# Patient Record
Sex: Female | Born: 1985
Health system: Southern US, Community
[De-identification: ages and names within clinical notes are randomized; demographics above are authoritative.]

## PROBLEM LIST (undated history)

## (undated) ENCOUNTER — Inpatient Hospital Stay (HOSPITAL_COMMUNITY): Payer: Self-pay

## (undated) DIAGNOSIS — O10919 Unspecified pre-existing hypertension complicating pregnancy, unspecified trimester: Secondary | ICD-10-CM

## (undated) DIAGNOSIS — A630 Anogenital (venereal) warts: Secondary | ICD-10-CM

## (undated) DIAGNOSIS — I5042 Chronic combined systolic (congestive) and diastolic (congestive) heart failure: Secondary | ICD-10-CM

## (undated) DIAGNOSIS — I513 Intracardiac thrombosis, not elsewhere classified: Secondary | ICD-10-CM

## (undated) DIAGNOSIS — I10 Essential (primary) hypertension: Secondary | ICD-10-CM

## (undated) DIAGNOSIS — J069 Acute upper respiratory infection, unspecified: Secondary | ICD-10-CM

## (undated) DIAGNOSIS — Z86711 Personal history of pulmonary embolism: Secondary | ICD-10-CM

## (undated) DIAGNOSIS — I499 Cardiac arrhythmia, unspecified: Secondary | ICD-10-CM

## (undated) DIAGNOSIS — I480 Paroxysmal atrial fibrillation: Secondary | ICD-10-CM

## (undated) DIAGNOSIS — I428 Other cardiomyopathies: Secondary | ICD-10-CM

## (undated) DIAGNOSIS — O99210 Obesity complicating pregnancy, unspecified trimester: Secondary | ICD-10-CM

## (undated) HISTORY — DX: Paroxysmal atrial fibrillation: I48.0

## (undated) HISTORY — DX: Acute upper respiratory infection, unspecified: J06.9

## (undated) HISTORY — DX: Other cardiomyopathies: I42.8

## (undated) HISTORY — DX: Obesity complicating pregnancy, unspecified trimester: O99.210

## (undated) HISTORY — DX: Personal history of pulmonary embolism: Z86.711

## (undated) HISTORY — DX: Essential (primary) hypertension: I10

## (undated) HISTORY — DX: Chronic combined systolic (congestive) and diastolic (congestive) heart failure: I50.42

## (undated) HISTORY — DX: Intracardiac thrombosis, not elsewhere classified: I51.3

## (undated) HISTORY — DX: Anogenital (venereal) warts: A63.0

## (undated) HISTORY — DX: Unspecified pre-existing hypertension complicating pregnancy, unspecified trimester: O10.919

---
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
## 2011-03-17 ENCOUNTER — Emergency Department (HOSPITAL_COMMUNITY)
Admission: EM | Admit: 2011-03-17 | Discharge: 2011-03-18 | Disposition: A | Discharge status: Home / Self Care | Payer: BC Managed Care – PPO | Attending: Emergency Medicine | Admitting: Emergency Medicine

## 2011-03-17 DIAGNOSIS — IMO0002 Reserved for concepts with insufficient information to code with codable children: Secondary | ICD-10-CM | POA: Insufficient documentation

## 2011-03-17 DIAGNOSIS — S93409A Sprain of unspecified ligament of unspecified ankle, initial encounter: Secondary | ICD-10-CM | POA: Insufficient documentation

## 2011-03-17 DIAGNOSIS — M25529 Pain in unspecified elbow: Secondary | ICD-10-CM | POA: Insufficient documentation

## 2011-03-17 DIAGNOSIS — S5000XA Contusion of unspecified elbow, initial encounter: Secondary | ICD-10-CM | POA: Insufficient documentation

## 2011-03-17 DIAGNOSIS — M25579 Pain in unspecified ankle and joints of unspecified foot: Secondary | ICD-10-CM | POA: Insufficient documentation

## 2011-03-18 ENCOUNTER — Emergency Department (HOSPITAL_COMMUNITY): Payer: BC Managed Care – PPO

## 2013-08-09 ENCOUNTER — Ambulatory Visit (INDEPENDENT_AMBULATORY_CARE_PROVIDER_SITE_OTHER): Payer: BC Managed Care – PPO | Admitting: Physician Assistant

## 2013-08-09 VITALS — BP 160/110 | HR 82 | Temp 98.7°F | Resp 16 | Ht 63.0 in | Wt 222.0 lb

## 2013-08-09 DIAGNOSIS — Z8619 Personal history of other infectious and parasitic diseases: Secondary | ICD-10-CM

## 2013-08-09 DIAGNOSIS — Z23 Encounter for immunization: Secondary | ICD-10-CM

## 2013-08-09 DIAGNOSIS — Z111 Encounter for screening for respiratory tuberculosis: Secondary | ICD-10-CM

## 2013-08-09 DIAGNOSIS — Z881 Allergy status to other antibiotic agents status: Secondary | ICD-10-CM

## 2013-08-09 NOTE — Patient Instructions (Addendum)
Return in 48-72 hours for the skin test to be read; Return in 2 months for the second dose of Gardasil vaccine, and then in April 2015 for dose #3.

## 2013-08-09 NOTE — Progress Notes (Signed)
  Tuberculosis Risk Questionnaire  1. No Were you born outside the [place] in one of the following parts of the world: [place], [place], [place], [place] or [place]?    2. No Have you traveled outside the [place] and lived for more than one month in one of the following parts of the world: [place], [place], [place], [place] or [place]?    3. No Do you have a compromised immune system such as from any of the following conditions:HIV/AIDS, organ or bone marrow transplantation, diabetes, immunosuppressive medicines (e.g. Prednisone, Remicaide), leukemia, lymphoma, cancer of the head or neck, gastrectomy or jejunal bypass, end-stage renal disease (on dialysis), or silicosis?     4. Yes [facility] 2011, not worked in this type of facility >12 months) Have you ever or do you plan on working in: a residential care center, a health care facility, a jail or prison or homeless shelter?    5. No Have you ever: injected illegal drugs, used crack cocaine, lived in a homeless shelter  or been in jail or prison?     6. No Have you ever been exposed to anyone with infectious tuberculosis?    Tuberculosis Symptom Questionnaire  Do you currently have any of the following symptoms?  1. No Unexplained cough lasting more than 3 weeks?   2. No Unexplained fever lasting more than 3 weeks.   3. No Night Sweats (sweating that leaves the bedclothes and sheets wet)     4. No Shortness of Breath   5. No Chest Pain   6. No Unintentional weight loss    7. No Unexplained fatigue (very tired for no reason)

## 2013-08-09 NOTE — Progress Notes (Signed)
  Subjective:    Patient ID: [name] [name], female    DOB: 04/22/1986, 27 y.o.   MRN: [id number]  HPI This 27 y.o. female presents for vaccinations.  She has discussed updating her immunizations with her PCP, and is now ready.  She desires varicella, Tdap, Influenza and TB screening.  She did not bring her vaccination record with her for review. Does not recall the date of her last tetanus booster, but thinks it was likely >10 years ago. Reports having had the chicken pox as a child. Received the Hepatitis B vaccine series. Has not been educated about vaccination to prevent HPV or meningococcal disease.    Active Ambulatory Problems    Diagnosis Date Noted  . No Active Ambulatory Problems   Resolved Ambulatory Problems    Diagnosis Date Noted  . No Resolved Ambulatory Problems   No Additional Past Medical History    History reviewed. No pertinent past surgical history.  No Known Allergies  Prior to Admission medications   Not on File    History   Social History  . Marital Status: Single    Spouse Name: n/a    Number of Children: 0  . Years of Education: college   Occupational History  . Customer Service Rep    Social History Main Topics  . Smoking status: Never Smoker   . Smokeless tobacco: Never Used  . Alcohol Use: No  . Drug Use: No  . Sexual Activity: None   Other Topics Concern  . None   Social History Narrative   Lives alone.  No family lives nearby.    family history includes Hypertension in her mother. indicated that her mother is deceased. She indicated that her father is alive.    Review of Systems No chest pain, SOB, HA, dizziness, vision change, N/V, diarrhea, constipation, dysuria, urinary urgency or frequency, myalgias, arthralgias or rash.     Objective:   Physical Exam  BP 160/110  Pulse 82  Temp(Src) 98.7 F (37.1 C) (Oral)  Resp 16  Ht 5\' 3"  (1.6 m)  Wt 222 lb (100.699 kg)  BMI 39.34 kg/m2  SpO2 100%  LMP  08/04/2013 WDWNBF, A&O x 3. Normal respiratory effort. Normal mood, appropriate affect.      Assessment & Plan:  Need for influenza vaccination - Plan: Flu Vaccine QUAD 36+ mos IM  Need for Tdap vaccination - Plan: Tdap vaccine greater than or equal to 7yo IM  Screening-pulmonary TB - Plan: TB Skin Test; RTC 48-72 hours for reading.  H/O varicella - Plan: Varicella zoster antibody, IgG; if not immune, will recommend vaccination.  Shingles vaccine at age 64 years.  Need for meningococcal vaccination - Plan: Meningococcal conjugate vaccine 4-valent IM. Understands that when vaccine for the B strain becomes available in the [place] (probably this spring), she should receive that as well.  Need for HPV vaccination - Plan: HPV vaccine quadravalent 3 dose IM; RTC 2 months for dose #2, 01/2014 for dose #3.  Fernande Bras, PA-C Physician Assistant-Certified [facility]

## 2013-08-10 LAB — VARICELLA ZOSTER ANTIBODY, IGG: Varicella IgG: 624.7 Index — ABNORMAL HIGH (ref <135.00)

## 2014-06-09 ENCOUNTER — Ambulatory Visit (INDEPENDENT_AMBULATORY_CARE_PROVIDER_SITE_OTHER): Payer: BC Managed Care – PPO | Admitting: Internal Medicine

## 2014-06-09 VITALS — BP 158/118 | HR 99 | Temp 98.6°F | Resp 18 | Ht 63.0 in | Wt 211.0 lb

## 2014-06-09 DIAGNOSIS — N898 Other specified noninflammatory disorders of vagina: Secondary | ICD-10-CM

## 2014-06-09 DIAGNOSIS — B373 Candidiasis of vulva and vagina: Secondary | ICD-10-CM

## 2014-06-09 DIAGNOSIS — B3731 Acute candidiasis of vulva and vagina: Secondary | ICD-10-CM

## 2014-06-09 LAB — POCT WET PREP WITH KOH
Clue Cells Wet Prep HPF POC: NEGATIVE
KOH Prep POC: NEGATIVE
Trichomonas, UA: NEGATIVE
Yeast Wet Prep HPF POC: POSITIVE

## 2014-06-09 MED ORDER — FLUCONAZOLE 150 MG PO TABS: 150.0000 mg | ORAL_TABLET | Freq: Once | ORAL | Status: DC

## 2014-06-09 NOTE — Progress Notes (Signed)
   Subjective:  This chart was scribed for [name] Lin, MD, by Neta Ehlers, ED Scribe. This patient's care was started at 4:54 PM.    Patient ID: [name] [name], female    DOB: 04/12/1986, 28 y.o.   MRN: [id number]  Chief Complaint  Patient presents with  . Vaginal Discharge    x 3 days    HPI  [name] [name] is a 28 y.o. female who presents to [facility] complaining of vaginal discharge, onset three days ago. She denies malodor associated with the discharge.   Ms. Jimmye [name] reports the current symptoms are similar to an episode of BV two years ago. She denies  dysuria, rash, or vaginal pain. She also denies a new sexual partner in the past three years. Additionally, she denies recent antibiotic usage.   condoms History reviewed. No pertinent past medical history.  History reviewed. No pertinent past surgical history.  No current outpatient prescriptions on file prior to visit.   No current facility-administered medications on file prior to visit.    Review of Systems  Constitutional: Negative for fever and chills.  Otherwise, see HPI.     Objective:   Physical Exam  Nursing note and vitals reviewed. Constitutional: She is oriented to person, place, and time. She appears well-developed and well-nourished. No distress.  HENT:  Head: Normocephalic and atraumatic.  Eyes: Conjunctivae and EOM are normal.  Neck: Neck supple. No tracheal deviation present.  Cardiovascular: Normal rate.   Pulmonary/Chest: Effort normal. No respiratory distress.  Musculoskeletal: Normal range of motion.  GU: External labial irritation, but no rash. She has a yellow discharge. Exam performed with pt's permission and with a chaperone present.  Neurological: She is alert and oriented to person, place, and time.  Skin: Skin is warm and dry.  Psychiatric: She has a normal mood and affect. Her behavior is normal.  Triage Vitals: BP 158/118  Pulse 99  Temp(Src) 98.6 F (37 C)  Resp 18  Ht 5\' 3"   (1.6 m)  Wt 211 lb (95.709 kg)  BMI 37.39 kg/m2  SpO2 100%  LMP 06/01/2014 Results for orders placed in visit on 06/09/14  POCT WET PREP WITH KOH      Result Value Ref Range   Trichomonas, UA Negative     Clue Cells Wet Prep HPF POC neg     Epithelial Wet Prep HPF POC 3-12     Yeast Wet Prep HPF POC pos     Bacteria Wet Prep HPF POC 3+     RBC Wet Prep HPF POC 0-1     WBC Wet Prep HPF POC tntc     KOH Prep POC Negative         Assessment & Plan:  I have completed the patient encounter in its entirety as documented by the scribe, with editing by me where necessary. Jamorian Dimaria P. Laney Pastor, M.D.  Vaginal discharge - Plan: POCT Wet Prep with KOH  Yeast vaginitis  Meds ordered this encounter  Medications  . fluconazole (DIFLUCAN) 150 MG tablet    Sig: Take 1 tablet (150 mg total) by mouth once.    Dispense:  1 tablet    Refill:  0

## 2014-06-19 ENCOUNTER — Telehealth: Payer: Self-pay

## 2014-06-19 MED ORDER — FLUCONAZOLE 150 MG PO TABS: 150.0000 mg | ORAL_TABLET | Freq: Once | ORAL | Status: DC

## 2014-06-19 NOTE — Telephone Encounter (Signed)
Rx sent.  If symptoms persist, RTC.  Meds ordered this encounter  Medications  . fluconazole (DIFLUCAN) 150 MG tablet    Sig: Take 1 tablet (150 mg total) by mouth once.    Dispense:  1 tablet    Refill:  0    Order Specific Question:  Supervising Provider    Answer:  DOOLITTLE, ROBERT P [[id number]]

## 2014-06-19 NOTE — Telephone Encounter (Signed)
Notified pt of RF and instr's. Pt agreed.

## 2014-06-19 NOTE — Telephone Encounter (Signed)
Called pt who reported that the diflucan did help the itching, it has resolved, but pt still has quite a bit of discharge.

## 2014-06-19 NOTE — Telephone Encounter (Signed)
Patient was prescribed Diflucan for yeast infection and she says it did not help. Patient is requesting a refill

## 2015-05-22 ENCOUNTER — Emergency Department (HOSPITAL_COMMUNITY): Payer: BLUE CROSS/BLUE SHIELD

## 2015-05-22 ENCOUNTER — Emergency Department (HOSPITAL_COMMUNITY)
Admission: EM | Admit: 2015-05-22 | Discharge: 2015-05-22 | Disposition: A | Discharge status: Home / Self Care | Payer: BLUE CROSS/BLUE SHIELD | Attending: Emergency Medicine | Admitting: Emergency Medicine

## 2015-05-22 ENCOUNTER — Encounter (HOSPITAL_COMMUNITY): Payer: Self-pay | Admitting: Physical Medicine and Rehabilitation

## 2015-05-22 DIAGNOSIS — I10 Essential (primary) hypertension: Secondary | ICD-10-CM

## 2015-05-22 DIAGNOSIS — R200 Anesthesia of skin: Secondary | ICD-10-CM | POA: Diagnosis present

## 2015-05-22 LAB — CBC WITH DIFFERENTIAL/PLATELET
BASOS ABS: 0 10*3/uL (ref 0.0–0.1)
Basophils Relative: 0 % (ref 0–1)
EOS ABS: 0.1 10*3/uL (ref 0.0–0.7)
EOS PCT: 2 % (ref 0–5)
HCT: 39 % (ref 36.0–46.0)
Hemoglobin: 13 g/dL (ref 12.0–15.0)
LYMPHS ABS: 2.6 10*3/uL (ref 0.7–4.0)
LYMPHS PCT: 35 % (ref 12–46)
MCH: 26.4 pg (ref 26.0–34.0)
MCHC: 33.3 g/dL (ref 30.0–36.0)
MCV: 79.1 fL (ref 78.0–100.0)
MONO ABS: 0.4 10*3/uL (ref 0.1–1.0)
MONOS PCT: 6 % (ref 3–12)
NEUTROS PCT: 57 % (ref 43–77)
Neutro Abs: 4.3 10*3/uL (ref 1.7–7.7)
Platelets: 252 10*3/uL (ref 150–400)
RBC: 4.93 MIL/uL (ref 3.87–5.11)
RDW: 13.9 % (ref 11.5–15.5)
WBC: 7.5 10*3/uL (ref 4.0–10.5)

## 2015-05-22 LAB — COMPREHENSIVE METABOLIC PANEL
ALBUMIN: 3.6 g/dL (ref 3.5–5.0)
ALT: 12 U/L — ABNORMAL LOW (ref 14–54)
ANION GAP: 9 (ref 5–15)
AST: 18 U/L (ref 15–41)
Alkaline Phosphatase: 62 U/L (ref 38–126)
BILIRUBIN TOTAL: 0.8 mg/dL (ref 0.3–1.2)
BUN: 6 mg/dL (ref 6–20)
CALCIUM: 9 mg/dL (ref 8.9–10.3)
CHLORIDE: 103 mmol/L (ref 101–111)
CO2: 26 mmol/L (ref 22–32)
Creatinine, Ser: 0.9 mg/dL (ref 0.44–1.00)
GFR calc Af Amer: >60 mL/min (ref >60)
GLUCOSE: 81 mg/dL (ref 65–99)
Potassium: 3.2 mmol/L — ABNORMAL LOW (ref 3.5–5.1)
Sodium: 138 mmol/L (ref 135–145)
TOTAL PROTEIN: 7.5 g/dL (ref 6.5–8.1)

## 2015-05-22 MED ORDER — LISINOPRIL 20 MG PO TABS
20.0000 mg | ORAL_TABLET | Freq: Once | ORAL | Status: AC
  Administered 2015-05-22: 20 mg via ORAL
  Filled 2015-05-22: qty 1

## 2015-05-22 NOTE — ED Notes (Addendum)
Pt reports numbness/tingling to L side of body. Onset this morning at 7:45am while driving to work. Pt is alert and oriented x4. Ambulatory to triage. No neurological deficits noted upon arrival to ED.

## 2015-05-22 NOTE — Discharge Instructions (Signed)
Follow up with your md tomorrow as planned and you will need to start bp medicine

## 2015-05-22 NOTE — ED Provider Notes (Signed)
CSN: [id number]     Arrival date & time 05/22/15  1344 History   First MD Initiated Contact with Patient 05/22/15 1723     Chief Complaint  Patient presents with  . Numbness     (Consider location/radiation/quality/duration/timing/severity/associated sxs/prior Treatment) Patient is a 29 y.o. female presenting with hypertension. The history is provided by the patient (the pt states she has had numbness to her left face, left arm and left leg today.  she was told she had high bp med, but is not on bp med now).  Hypertension This is a recurrent problem. The current episode started more than 2 days ago. The problem occurs constantly. The problem has not changed since onset.Pertinent negatives include no chest pain, no abdominal pain and no headaches. Nothing aggravates the symptoms. Nothing relieves the symptoms.    History reviewed. No pertinent past medical history. History reviewed. No pertinent past surgical history. Family History  Problem Relation Age of Onset  . Hypertension Mother    History  Substance Use Topics  . Smoking status: Never Smoker   . Smokeless tobacco: Never Used  . Alcohol Use: No   OB History    No data available     Review of Systems  Constitutional: Negative for appetite change and fatigue.  HENT: Negative for congestion, ear discharge and sinus pressure.   Eyes: Negative for discharge.  Respiratory: Negative for cough.   Cardiovascular: Negative for chest pain.  Gastrointestinal: Negative for abdominal pain and diarrhea.  Genitourinary: Negative for frequency and hematuria.  Musculoskeletal: Negative for back pain.  Skin: Negative for rash.  Neurological: Negative for seizures and headaches.       Numbness to face and extremities on left  Psychiatric/Behavioral: Negative for hallucinations.      Allergies  Review of patient's allergies indicates no known allergies.  Home Medications   Prior to Admission medications   Medication Sig Start  Date End Date Taking? Authorizing Provider  fluconazole (DIFLUCAN) 150 MG tablet Take 1 tablet (150 mg total) by mouth once. Patient not taking: Reported on 05/22/2015 06/19/14   Chelle Jeffery, PA-C   BP 169/119 mmHg  Pulse 82  Temp(Src) 98.7 F (37.1 C) (Oral)  Resp 13  SpO2 99% Physical Exam  Constitutional: She is oriented to person, place, and time. She appears well-developed.  HENT:  Head: Normocephalic.  Eyes: Conjunctivae and EOM are normal. No scleral icterus.  Neck: Neck supple. No thyromegaly present.  Cardiovascular: Normal rate and regular rhythm.  Exam reveals no gallop and no friction rub.   No murmur heard. Pulmonary/Chest: No stridor. She has no wheezes. She has no rales. She exhibits no tenderness.  Abdominal: She exhibits no distension. There is no tenderness. There is no rebound.  Musculoskeletal: Normal range of motion. She exhibits no edema.  Lymphadenopathy:    She has no cervical adenopathy.  Neurological: She is oriented to person, place, and time. She exhibits normal muscle tone. Coordination normal.  Mild decrease sensation to left face, left arm and leg  Skin: No rash noted. No erythema.  Psychiatric: She has a normal mood and affect. Her behavior is normal.    ED Course  Procedures (including critical care time) Labs Review Labs Reviewed  COMPREHENSIVE METABOLIC PANEL - Abnormal; Notable for the following:    Potassium 3.2 (*)    ALT 12 (*)    All other components within normal limits  CBC WITH DIFFERENTIAL/PLATELET    Imaging Review Ct Head Wo Contrast  05/22/2015  CLINICAL DATA:  Left side body numbness  EXAM: CT HEAD WITHOUT CONTRAST  TECHNIQUE: Contiguous axial images were obtained from the base of the skull through the vertex without intravenous contrast.  COMPARISON:  None.  FINDINGS: No skull fracture is noted. Paranasal sinuses and mastoid air cells are unremarkable. No intracranial hemorrhage, mass effect or midline shift.  No acute cortical  infarction. No hydrocephalus. No mass lesion is noted on this unenhanced scan. The gray and white-matter differentiation is preserved.  IMPRESSION: 1. No acute intracranial abnormality.   Electronically Signed   By: Lahoma Crocker M.D.   On: 05/22/2015 15:59   Mr Brain Wo Contrast  05/22/2015   CLINICAL DATA:  Numbness and tingling on the LEFT side of the body began earlier today. No other symptoms.  EXAM: MRI HEAD WITHOUT CONTRAST  TECHNIQUE: Multiplanar, multiecho pulse sequences of the brain and surrounding structures were obtained without intravenous contrast.  COMPARISON:  CT head 05/22/2015.  FINDINGS: No evidence for acute infarction, hemorrhage, mass lesion, hydrocephalus, or extra-axial fluid. Normal cerebral volume. Tiny focus of subcortical white matter T2 and FLAIR signal abnormality RIGHT frontal, incidental. No significant white matter disease. No evidence for demyelinating disease.  Pituitary, pineal, and cerebellar tonsils unremarkable. No upper cervical lesions. Moderate nasopharyngeal adenoidal hypertrophy. Flow voids are maintained throughout the carotid, basilar, and vertebral arteries. There are no areas of chronic hemorrhage. Vertebrals are codominant.  Visualized calvarium, skull base, and upper cervical osseous structures unremarkable. Scalp and extracranial soft tissues, orbits, sinuses, and mastoids show no acute process. Shotty cervical lymph nodes are likely reactive.  IMPRESSION: Negative exam.  No acute stroke or intracranial mass lesion.  Normal cerebral volume without significant white matter disease.   Electronically Signed   By: Staci Righter M.D.   On: 05/22/2015 19:35     EKG Interpretation   Date/Time:  Tuesday May 22 2015 14:51:10 EDT Ventricular Rate:  79 PR Interval:  156 QRS Duration: 74 QT Interval:  376 QTC Calculation: 431 R Axis:   32 Text Interpretation:  Normal sinus rhythm Nonspecific T wave abnormality  Abnormal ECG Confirmed by Ramiya Delahunty  MD, Broadus John  [phone]) on 05/22/2015 6:30:12  PM      MDM   Final diagnoses:  Essential hypertension    Left side numbness,  Nl mri head,  Uncontrolled htn,   Suspect some symptoms related to htn,  Pt given lisinopril and will follow up with pcp in am     Milton Ferguson, MD 05/22/15 1949

## 2015-08-07 LAB — OB RESULTS CONSOLE RPR: RPR: NONREACTIVE

## 2015-08-07 LAB — OB RESULTS CONSOLE ANTIBODY SCREEN: Antibody Screen: NEGATIVE

## 2015-08-07 LAB — OB RESULTS CONSOLE GC/CHLAMYDIA
Chlamydia: NEGATIVE
GC PROBE AMP, GENITAL: NEGATIVE

## 2015-08-07 LAB — OB RESULTS CONSOLE ABO/RH: RH Type: POSITIVE

## 2015-08-07 LAB — OB RESULTS CONSOLE HIV ANTIBODY (ROUTINE TESTING): HIV: NONREACTIVE

## 2015-08-07 LAB — GLUCOSE TOLERANCE, 1 HOUR: GLUCOSE 1 HOUR GTT: 143 mg/dL (ref ?–200)

## 2015-08-07 LAB — OB RESULTS CONSOLE RUBELLA ANTIBODY, IGM: Rubella: IMMUNE

## 2015-08-07 LAB — OB RESULTS CONSOLE HEPATITIS B SURFACE ANTIGEN: HEP B S AG: NEGATIVE

## 2015-08-07 LAB — OB RESULTS CONSOLE HGB/HCT, BLOOD
HCT: 32 %
HEMOGLOBIN: 11.3 g/dL

## 2015-08-07 LAB — OB RESULTS CONSOLE PLATELET COUNT: PLATELETS: 224 10*3/uL

## 2015-08-21 LAB — GLUCOSE, FASTING
GLUCOSE 1 HOUR GTT: 146 mg/dL (ref ?–200)
GLUCOSE 2 HOUR GTT: 131 mg/dL (ref ?–140)
GLUCOSE FASTING: 110
Glucose, GTT - 3 Hour: 117 mg/dL (ref ?–140)

## 2015-08-21 LAB — GLUCOSE, 3 HOUR GESTATIONAL
GLUCOSE 1 HOUR GTT: 146 mg/dL (ref ?–200)
GLUCOSE 2 HOUR GTT: 131 mg/dL (ref ?–140)
Glucose Fasting: 110
Glucose, GTT - 3 Hour: 117 mg/dL (ref ?–140)

## 2015-09-04 ENCOUNTER — Ambulatory Visit (HOSPITAL_COMMUNITY): Admission: RE | Admit: 2015-09-04 | Payer: BLUE CROSS/BLUE SHIELD | Source: Ambulatory Visit

## 2015-09-21 ENCOUNTER — Ambulatory Visit (INDEPENDENT_AMBULATORY_CARE_PROVIDER_SITE_OTHER): Payer: BLUE CROSS/BLUE SHIELD | Admitting: Internal Medicine

## 2015-09-21 ENCOUNTER — Encounter: Payer: Self-pay | Admitting: Internal Medicine

## 2015-09-21 ENCOUNTER — Telehealth: Payer: Self-pay | Admitting: Internal Medicine

## 2015-09-21 VITALS — BP 154/108 | HR 111 | Ht 63.0 in | Wt 243.8 lb

## 2015-09-21 DIAGNOSIS — I10 Essential (primary) hypertension: Secondary | ICD-10-CM | POA: Diagnosis not present

## 2015-09-21 DIAGNOSIS — R0602 Shortness of breath: Secondary | ICD-10-CM

## 2015-09-21 LAB — BASIC METABOLIC PANEL
BUN: 7 mg/dL (ref 7–25)
CO2: 24 mmol/L (ref 20–31)
CREATININE: 0.64 mg/dL (ref 0.50–1.10)
Calcium: 9.2 mg/dL (ref 8.6–10.2)
Chloride: 105 mmol/L (ref 98–110)
Glucose, Bld: 75 mg/dL (ref 65–99)
Potassium: 3.6 mmol/L (ref 3.5–5.3)
Sodium: 139 mmol/L (ref 135–146)

## 2015-09-21 LAB — CBC
HCT: 34.7 % — ABNORMAL LOW (ref 36.0–46.0)
Hemoglobin: 11.5 g/dL — ABNORMAL LOW (ref 12.0–15.0)
MCH: 26 pg (ref 26.0–34.0)
MCHC: 33.1 g/dL (ref 30.0–36.0)
MCV: 78.3 fL (ref 78.0–100.0)
MPV: 8.9 fL (ref 8.6–12.4)
Platelets: 253 10*3/uL (ref 150–400)
RBC: 4.43 MIL/uL (ref 3.87–5.11)
RDW: 15.4 % (ref 11.5–15.5)
WBC: 10.1 10*3/uL (ref 4.0–10.5)

## 2015-09-21 LAB — TSH: TSH: 1.257 u[IU]/mL (ref 0.350–4.500)

## 2015-09-21 NOTE — Telephone Encounter (Signed)
Follow Up ° ° ° ° °Pt is returning call from earlier. Please call. °

## 2015-09-21 NOTE — Telephone Encounter (Signed)
This call was transferred to me, I did not have to call her back.  Informed her that she has appointment with HTN clinic next Friday 2:30 pm Pt verbalizes understanding and agreement.

## 2015-09-21 NOTE — Patient Instructions (Addendum)
Your physician has requested that you have an echocardiogram. Echocardiography is a painless test that uses sound waves to create images of your heart. It provides your doctor with information about the size and shape of your heart and how well your heart's chambers and valves are working. This procedure takes approximately one hour. There are no restrictions for this procedure.  Your physician recommends that you return for lab work in: BMET, TSH, CBC  You have been referred to the Hypertension Clinic with Elberta Leatherwood, PharmD to help manage your blood pressure.

## 2015-09-23 NOTE — Progress Notes (Signed)
   Cardiology Office Note   Date:  09/23/2015   ID:  [name] [name], DOB 1986/07/26, MRN [id number]  PCP:  Rachell Cipro, MD  Cardiologist:   Dorris Carnes, MD   Pt presents for f/u of HTN      History of Present Illness: [name] Jimmye Norman is a 29 y.o. female with a history long hisotry of HTN  The pt was seen in IM in the past  Took an ACE I at one point which she said worked   She was seen in ER in August with numbness of L face L arm and L leg  Told BPwas high  169/119  Since then her medicines have been adjusted.  She denies CP  Breathing is OK        Current Outpatient Prescriptions  Medication Sig Dispense Refill  . labetalol (NORMODYNE) 200 MG tablet Take 400 mg by mouth 3 (three) times daily.    Marland Kitchen NIFEdipine (PROCARDIA-XL/ADALAT-CC/NIFEDICAL-XL) 30 MG 24 hr tablet Take 30 mg by mouth 2 (two) times daily.     No current facility-administered medications for this visit.    Allergies:   Review of patient's allergies indicates no known allergies.   Past Medical History  Diagnosis Date  . Hypertension   . Obesity in pregnancy     Antepartum, first trimester  . URI (upper respiratory infection)   . Condyloma acuminata     No past surgical history on file.   Social History:  The patient  reports that she has never smoked. She has never used smokeless tobacco. She reports that she does not drink alcohol or use illicit drugs.   Family History:  The patient's family history includes Hypertension in her mother.    ROS:  Please see the history of present illness. All other systems are reviewed and  Negative to the above problem except as noted.    PHYSICAL EXAM: VS:  BP 154/108 mmHg  Pulse 111  Ht 5\' 3"  (1.6 m)  Wt 110.587 kg (243 lb 12.8 oz)  BMI 43.20 kg/m2  SpO2 98%  GEN: Well nourished, well developed, in no acute distress HEENT: normal Neck: no JVD, carotid bruits, or masses Cardiac: RRR; no murmurs, rubs, or gallops,no edema  Respiratory:  clear to  auscultation bilaterally, normal work of breathing GI: soft, nontender, nondistended, + BS  No hepatomegaly  MS: no deformity Moving all extremities   Skin: warm and dry, no rash Neuro:  Strength and sensation are intact Psych: euthymic mood, full affect   EKG:  EKG is not ordered today.   Lipid Panel No results found for: CHOL, TRIG, HDL, CHOLHDL, VLDL, LDLCALC, LDLDIRECT    Wt Readings from Last 3 Encounters:  09/21/15 110.587 kg (243 lb 12.8 oz)  06/09/14 95.709 kg (211 lb)  08/09/13 100.699 kg (222 lb)      ASSESSMENT AND PLAN:  1  HTN  BP is high  Need to clarify medical regimen and that she is taking it  REview with pharmacy  Check CBC TSH and electrolytes      Signed, Dorris Carnes, MD  09/23/2015 1:12 AM    [address], [place], [place]  [address] Phone: [phone]; Fax: [phone]

## 2015-09-24 ENCOUNTER — Encounter: Payer: Self-pay | Admitting: *Deleted

## 2015-09-24 DIAGNOSIS — O10919 Unspecified pre-existing hypertension complicating pregnancy, unspecified trimester: Secondary | ICD-10-CM

## 2015-09-24 DIAGNOSIS — I159 Secondary hypertension, unspecified: Secondary | ICD-10-CM

## 2015-09-24 DIAGNOSIS — E669 Obesity, unspecified: Secondary | ICD-10-CM

## 2015-09-24 DIAGNOSIS — O99210 Obesity complicating pregnancy, unspecified trimester: Secondary | ICD-10-CM

## 2015-09-24 DIAGNOSIS — O0990 Supervision of high risk pregnancy, unspecified, unspecified trimester: Secondary | ICD-10-CM | POA: Insufficient documentation

## 2015-09-24 HISTORY — DX: Unspecified pre-existing hypertension complicating pregnancy, unspecified trimester: O10.919

## 2015-09-26 ENCOUNTER — Telehealth: Payer: Self-pay | Admitting: Internal Medicine

## 2015-09-26 NOTE — Telephone Encounter (Signed)
New message ° ° ° ° °Returning a call to the nurse to get lab results °

## 2015-09-26 NOTE — Telephone Encounter (Signed)
Notified of lab results. 

## 2015-09-28 ENCOUNTER — Ambulatory Visit: Payer: BLUE CROSS/BLUE SHIELD | Admitting: Pharmacist

## 2015-09-28 ENCOUNTER — Telehealth: Payer: Self-pay | Admitting: *Deleted

## 2015-09-28 MED ORDER — METHYLDOPA 250 MG PO TABS: 250.0000 mg | ORAL_TABLET | Freq: Two times a day (BID) | ORAL | Status: DC

## 2015-09-28 NOTE — Telephone Encounter (Signed)
Message     Would recomm aldomet 250 mg bid to add to other meds    F/U in clinic in a couple wks for BP check      Sent medication to [facility]. Called patient to inform. She was scheduled for HTN clinic today at 2:30 pm, which she requested to cancel; "I didn't know if I should still come".  I advised her Dr. Harrington Challenger wants her to follow up for BP check in couple weeks.  Rescheduled her for Jan 3 in HTN clinic.  Pt verbalizes understanding.  She does not have a cuff at home to check her BP but is planning to get one.

## 2015-10-11 ENCOUNTER — Other Ambulatory Visit: Payer: Self-pay

## 2015-10-11 ENCOUNTER — Ambulatory Visit (HOSPITAL_COMMUNITY): Payer: BLUE CROSS/BLUE SHIELD | Attending: Cardiovascular Disease

## 2015-10-11 DIAGNOSIS — I10 Essential (primary) hypertension: Secondary | ICD-10-CM

## 2015-10-11 DIAGNOSIS — I517 Cardiomegaly: Secondary | ICD-10-CM | POA: Insufficient documentation

## 2015-10-11 DIAGNOSIS — R0600 Dyspnea, unspecified: Secondary | ICD-10-CM | POA: Diagnosis present

## 2015-10-11 DIAGNOSIS — Z6841 Body Mass Index (BMI) 40.0 and over, adult: Secondary | ICD-10-CM | POA: Insufficient documentation

## 2015-10-11 DIAGNOSIS — R0602 Shortness of breath: Secondary | ICD-10-CM | POA: Diagnosis not present

## 2015-10-11 DIAGNOSIS — E669 Obesity, unspecified: Secondary | ICD-10-CM | POA: Diagnosis not present

## 2015-10-11 DIAGNOSIS — Z3A20 20 weeks gestation of pregnancy: Secondary | ICD-10-CM | POA: Diagnosis not present

## 2015-10-16 ENCOUNTER — Ambulatory Visit (INDEPENDENT_AMBULATORY_CARE_PROVIDER_SITE_OTHER): Payer: BLUE CROSS/BLUE SHIELD | Admitting: Pharmacist

## 2015-10-16 VITALS — BP 142/108 | HR 95

## 2015-10-16 DIAGNOSIS — I159 Secondary hypertension, unspecified: Secondary | ICD-10-CM | POA: Diagnosis not present

## 2015-10-16 NOTE — Progress Notes (Signed)
Patient ID: [name] [name]                 DOB:   02/10/1986                       MRN:  [id number]     HPI: [name] [name] is a 30 y.o. female referred by Dr. Harrington Challenger to HTN clinic.  She reports being non-adherent to her blood pressure medications.  She states she often skips doses because the medications make her nauseous. Per her report she has not been taking the nifedipine at all, has been taking methyldopa twice a day most days of the week, and has been taking the labetalol once a day 3-4 times a week. She thought she should be taking 12 tablets of the labetalol per day, but states she takes a max of 4 tablets. She was recently prescribed Zofran for the nausea and states this has helped. She has been having headaches recently and did not realize these could be associated with her blood pressure being high.   Current HTN meds:  Labetalol 400mg  three times a day Methyldopa 250mg  twice a day Nifedipine 30mg  - NOT taking  BP goal: < 140/90  Social History: First pregnancy  Home BP readings: not monitoring at home  Wt Readings from Last 3 Encounters:  09/21/15 243 lb 12.8 oz (110.587 kg)  06/09/14 211 lb (95.709 kg)  08/09/13 222 lb (100.699 kg)   BP Readings from Last 3 Encounters:  10/16/15 142/108  09/21/15 154/108  05/22/15 174/116   Pulse Readings from Last 3 Encounters:  10/16/15 95  09/21/15 111  05/22/15 74    Renal function: CrCl cannot be calculated (Unknown ideal weight.).  Past Medical History  Diagnosis Date  . Hypertension   . Obesity in pregnancy     Antepartum, first trimester  . URI (upper respiratory infection)   . Condyloma acuminata     Current Outpatient Prescriptions on File Prior to Visit  Medication Sig Dispense Refill  . labetalol (NORMODYNE) 200 MG tablet Take 400 mg by mouth 3 (three) times daily.    . methyldopa (ALDOMET) 250 MG tablet Take 1 tablet (250 mg total) by mouth 2 (two) times daily. 60 tablet 6  . NIFEdipine  (PROCARDIA-XL/ADALAT-CC/NIFEDICAL-XL) 30 MG 24 hr tablet Take 30 mg by mouth 2 (two) times daily. Reported on 10/16/2015     No current facility-administered medications on file prior to visit.    No Known Allergies   Assessment/Plan: Hypertension in pregnancy:  Patient is not at goal and having symptoms of high blood pressure. Instructed patient to take labetalol and methyldopa as prescribed. For the time being instructed patient to continue not taking nifedipine since methyldopa and labetalol are safer in the setting of pregnancy and she will likely be at goal with adherence to labetalol and methyldopa therapies. Follow-up in hypertension clinic in 2 weeks.

## 2015-10-16 NOTE — Patient Instructions (Signed)
Take medications as prescribed. Labetalol 2 tablets three times a day and methyldopa 1 tablet twice a day. Please call [phone] if experiencing nausea or having other problems.   Return to hypertension clinic in 2 weeks (January 17th at 10:30am).

## 2015-10-18 ENCOUNTER — Ambulatory Visit (INDEPENDENT_AMBULATORY_CARE_PROVIDER_SITE_OTHER): Payer: BLUE CROSS/BLUE SHIELD | Admitting: Family Medicine

## 2015-10-18 ENCOUNTER — Encounter: Payer: Self-pay | Admitting: Family Medicine

## 2015-10-18 VITALS — BP 152/98 | HR 93 | Temp 98.3°F | Wt 247.5 lb

## 2015-10-18 DIAGNOSIS — O289 Unspecified abnormal findings on antenatal screening of mother: Secondary | ICD-10-CM

## 2015-10-18 DIAGNOSIS — L309 Dermatitis, unspecified: Secondary | ICD-10-CM

## 2015-10-18 DIAGNOSIS — O10919 Unspecified pre-existing hypertension complicating pregnancy, unspecified trimester: Secondary | ICD-10-CM

## 2015-10-18 DIAGNOSIS — O10912 Unspecified pre-existing hypertension complicating pregnancy, second trimester: Secondary | ICD-10-CM

## 2015-10-18 DIAGNOSIS — O280 Abnormal hematological finding on antenatal screening of mother: Secondary | ICD-10-CM | POA: Insufficient documentation

## 2015-10-18 DIAGNOSIS — O0990 Supervision of high risk pregnancy, unspecified, unspecified trimester: Secondary | ICD-10-CM

## 2015-10-18 LAB — POCT URINALYSIS DIP (DEVICE)
Bilirubin Urine: NEGATIVE
GLUCOSE, UA: NEGATIVE mg/dL
Hgb urine dipstick: NEGATIVE
Ketones, ur: NEGATIVE mg/dL
Nitrite: NEGATIVE
PH: 6.5 (ref 5.0–8.0)
PROTEIN: 30 mg/dL — AB
SPECIFIC GRAVITY, URINE: 1.025 (ref 1.005–1.030)
Urobilinogen, UA: 0.2 mg/dL (ref 0.0–1.0)

## 2015-10-18 MED ORDER — ASPIRIN EC 81 MG PO TBEC: 81.0000 mg | DELAYED_RELEASE_TABLET | Freq: Every day | ORAL | Status: DC

## 2015-10-18 MED ORDER — TRIAMCINOLONE ACETONIDE 0.1 % EX CREA: 1.0000 "application " | TOPICAL_CREAM | Freq: Two times a day (BID) | CUTANEOUS | Status: DC

## 2015-10-18 MED ORDER — LABETALOL HCL 200 MG PO TABS: 600.0000 mg | ORAL_TABLET | Freq: Three times a day (TID) | ORAL | Status: DC

## 2015-10-18 NOTE — Patient Instructions (Signed)

## 2015-10-18 NOTE — Progress Notes (Signed)
Genetic counseling/ growth U/S with Ethel 10/26/15 @ 2p (first avail).

## 2015-10-18 NOTE — Progress Notes (Signed)
   Subjective:    [name] [name] is a G1P0 [redacted]w[redacted]d being seen today for her first obstetrical visit.  She was previously seen at [facility], but was transferred here for difficult to control hypertension.  Her obstetrical history is significant for obesity. Patient does intend to breast feed. Pregnancy history fully reviewed.  Patient reports nausea with taking labetalol.Danley Danker Vitals:   10/18/15 [id number] 10/18/15 1006  BP: 165/93 152/98  Pulse: 93   Temp: 98.3 F (36.8 C)   Weight: 247 lb 8 oz (112.265 kg)     HISTORY: OB History  Gravida Para Term Preterm AB SAB TAB Ectopic Multiple Living  1             # Outcome Date GA Lbr Len/2nd Weight Sex Delivery Anes PTL Lv  1 Current              Past Medical History  Diagnosis Date  . Hypertension   . Obesity in pregnancy     Antepartum, first trimester  . URI (upper respiratory infection)   . Condyloma acuminata    Past Surgical History  Procedure Laterality Date  . No past surgeries     Family History  Problem Relation Age of Onset  . Hypertension Mother   . Heart disease Mother      Exam    Uterus:     System:     Skin: dermatitis noted: abdomen    Neurologic: gait normal; reflexes normal and symmetric   Extremities: normal strength, tone, and muscle mass   HEENT PERRLA   Mouth/Teeth mucous membranes moist, pharynx normal without lesions       Cardiovascular: regular rate and rhythm, no murmurs or gallops   Respiratory:  appears well, vitals normal, no respiratory distress, acyanotic, normal RR, ear and throat exam is normal, neck free of mass or lymphadenopathy, chest clear, no wheezing, crepitations, rhonchi, normal symmetric air entry   Abdomen: soft, non-tender; bowel sounds normal; no masses,  no organomegaly          Assessment:    Pregnancy: G1P0 Patient Active Problem List   Diagnosis Date Noted  . Abnormal quad screen 10/18/2015  . Supervision of high-risk pregnancy 09/24/2015  . HTN in  pregnancy, chronic 09/24/2015  . HTN (hypertension) 09/24/2015  . Obesity affecting pregnancy, antepartum 09/24/2015  . Obesity 09/24/2015        Plan:     1. Supervision of high-risk pregnancy, unspecified trimester FHT normal. F/u 4 weeks. - AMB referral to maternal fetal medicine  2. Abnormal quad screen Referred to MFM/genetic counseling for elevated quad screen risk - AMB referral to maternal fetal medicine  3. HTN in pregnancy, chronic, unspecified trimester Increase labetalol to 600mg  TID.  Continue methyldopa and cardiology consult.   Needs 24hr Urine - has container for collection.  Will bring it next week. Repeat US for growth. - [place] MFM OB FOLLOW UP; Future  4. Eczema Triamcinolone cream BID.   Loma Boston JEHIEL 10/18/2015

## 2015-10-18 NOTE — Progress Notes (Signed)
BP 152/98 after 10 min sitting, asyptomatic Breastfeeding tip of the week reviewed Patient transferred from [facility], up to date on labs, has had flu shot

## 2015-10-18 NOTE — Addendum Note (Signed)
Addended by: Truett Mainland on: 10/18/2015 11:37 AM   Modules accepted: Orders

## 2015-10-24 DIAGNOSIS — O210 Mild hyperemesis gravidarum: Secondary | ICD-10-CM | POA: Insufficient documentation

## 2015-10-25 ENCOUNTER — Telehealth: Payer: Self-pay | Admitting: *Deleted

## 2015-10-25 DIAGNOSIS — B3731 Acute candidiasis of vulva and vagina: Secondary | ICD-10-CM

## 2015-10-25 DIAGNOSIS — B373 Candidiasis of vulva and vagina: Secondary | ICD-10-CM

## 2015-10-25 MED ORDER — TERCONAZOLE 0.4 % VA CREA
1.0000 | TOPICAL_CREAM | Freq: Every day | VAGINAL | Status: DC
Start: 2015-10-25 — End: 2015-11-19

## 2015-10-25 NOTE — Telephone Encounter (Signed)
Patient called and stated that she needs an rx for a yeast infection. She is experiencing white discharge and itching. rx to pharmacy per protocol.

## 2015-10-26 ENCOUNTER — Ambulatory Visit (HOSPITAL_COMMUNITY)
Admission: RE | Admit: 2015-10-26 | Discharge: 2015-10-26 | Disposition: A | Discharge status: Home / Self Care | Payer: BLUE CROSS/BLUE SHIELD | Source: Ambulatory Visit | Attending: Family Medicine | Admitting: Family Medicine

## 2015-10-26 ENCOUNTER — Other Ambulatory Visit: Payer: Self-pay | Admitting: Family Medicine

## 2015-10-26 ENCOUNTER — Encounter (HOSPITAL_COMMUNITY): Payer: Self-pay

## 2015-10-26 ENCOUNTER — Encounter: Payer: Self-pay | Admitting: *Deleted

## 2015-10-26 VITALS — BP 161/92 | HR 92 | Wt 246.4 lb

## 2015-10-26 DIAGNOSIS — O10019 Pre-existing essential hypertension complicating pregnancy, unspecified trimester: Secondary | ICD-10-CM

## 2015-10-26 DIAGNOSIS — IMO0002 Reserved for concepts with insufficient information to code with codable children: Secondary | ICD-10-CM

## 2015-10-26 DIAGNOSIS — O283 Abnormal ultrasonic finding on antenatal screening of mother: Secondary | ICD-10-CM | POA: Insufficient documentation

## 2015-10-26 DIAGNOSIS — O289 Unspecified abnormal findings on antenatal screening of mother: Secondary | ICD-10-CM

## 2015-10-26 DIAGNOSIS — O99212 Obesity complicating pregnancy, second trimester: Secondary | ICD-10-CM

## 2015-10-26 DIAGNOSIS — Z3A22 22 weeks gestation of pregnancy: Secondary | ICD-10-CM

## 2015-10-26 DIAGNOSIS — O280 Abnormal hematological finding on antenatal screening of mother: Secondary | ICD-10-CM

## 2015-10-26 DIAGNOSIS — O10012 Pre-existing essential hypertension complicating pregnancy, second trimester: Secondary | ICD-10-CM | POA: Insufficient documentation

## 2015-10-26 DIAGNOSIS — Z0489 Encounter for examination and observation for other specified reasons: Secondary | ICD-10-CM

## 2015-10-26 DIAGNOSIS — Z3689 Encounter for other specified antenatal screening: Secondary | ICD-10-CM

## 2015-10-26 DIAGNOSIS — O10919 Unspecified pre-existing hypertension complicating pregnancy, unspecified trimester: Secondary | ICD-10-CM

## 2015-10-26 DIAGNOSIS — O0990 Supervision of high risk pregnancy, unspecified, unspecified trimester: Secondary | ICD-10-CM

## 2015-10-30 ENCOUNTER — Ambulatory Visit: Payer: BLUE CROSS/BLUE SHIELD | Admitting: Pharmacist

## 2015-10-30 NOTE — Progress Notes (Signed)
Genetic Counseling  High-Risk Gestation Note  Appointment Date:  10/26/2015 Referred By: Truett Mainland, DO Date of Birth:  May 10, 1986   Pregnancy History: G1P0 Estimated Date of Delivery: 02/29/16 Estimated Gestational Age: [redacted]w[redacted]d Attending: Benjaman Lobe, MD   Ms. [name] [name] was seen for genetic counseling because of an increased risk for fetal Down syndrome based on Quad screen through [employer].  In Summary:  1 in 180 Down syndrome risk from Quad  Detailed ultrasound within normal limits, but limited fetal heart views obtained  Follow-up ultrasound scheduled for 11/23/15  Patient declined NIPS and amniocentesis; Expressed interest in amniocentesis but did not want procedure today. She plans to further discuss with the father of the pregnancy and call back to schedule amniocentesis, if desired.   Patient's medical history significant for hypertension  She was counseled regarding the Quad screen result and the associated 1 in 180 risk for fetal Down syndrome.  We reviewed chromosomes, nondisjunction, and the common features and variable prognosis of Down syndrome.  In addition, we reviewed the screen adjusted reduction in risks for trisomy 18 and ONTDs.  We also discussed other explanations for a screen positive result including: a gestational dating error, differences in maternal metabolism, and normal variation. They understand that this screening is not diagnostic for Down syndrome but provides a risk assessment.  We reviewed available screening options including noninvasive prenatal screening (NIPS)/cell free DNA (cfDNA) testing and detailed ultrasound.  She was counseled that screening tests are used to modify a patient's a priori risk for aneuploidy, typically based on age. This estimate provides a pregnancy specific risk assessment. We reviewed the benefits and limitations of each option. Specifically, we discussed the conditions for which each test screens, the detection rates,  and false positive rates of each. She was also counseled regarding diagnostic testing via amniocentesis. We reviewed the approximate 1 in 99991111 risk for complications for amniocentesis, including spontaneous pregnancy loss.   We reviewed results of detailed ultrasound performed today. Visualized fetal anatomy was within normal limits. Fetal heart views were sub-optimal. Complete ultrasound results reported separately. Follow-up ultrasound scheduled in 4 weeks.   After consideration of all the options, she declined NIPS.  She expressed that she was comfortable with the risk assessment for Quad screen but also expressed interest in pursuing amniocentesis. She declined amniocentesis at the time of today's visit. She planned to further discuss the option of amniocentesis with the father of the pregnancy and call our office back to schedule, if this procedure is desired. She understands that screening tests cannot rule out all birth defects or genetic syndromes. The patient was advised of this limitation and states she still does not want additional testing at this time.   Ms. [name] [name] was provided with written information regarding sickle cell anemia (SCA) including the carrier frequency and incidence in the African-American population, the availability of carrier testing and prenatal diagnosis if indicated.  In addition, we discussed that hemoglobinopathies are routinely screened for as part of the [place] newborn screening panel.  She previously had normal hemoglobin electrophoresis performed through her OB provider.   Both family histories were reviewed and found to be contributory for recurrent pregnancy loss for the sister to the father of the pregnancy. The patient had limited information but reported that this relative subsequently had cerclage placement in pregnancy. We reviewed that this reported history is less suggestive of an underlying genetic reason and that recurrence risk for extended  relatives is likely low. However, without further information  regarding the provided family history, an accurate genetic risk cannot be calculated. Further genetic counseling is warranted if more information is obtained.  Ms. [name] [name] denied exposure to environmental toxins or chemical agents. She denied the use of alcohol, tobacco or street drugs. She denied significant viral illnesses during the course of her pregnancy. Her medical and surgical histories were contributory for hypertension, for which she is followed by cardiology.   I counseled Ms. [name] [name] for approximately 40 minutes regarding the above risks and available options.   Chipper Oman, MS,  Certified Genetic Counselor 10/30/2015

## 2015-11-01 ENCOUNTER — Ambulatory Visit (INDEPENDENT_AMBULATORY_CARE_PROVIDER_SITE_OTHER): Payer: Medicaid Other | Admitting: Physician Assistant

## 2015-11-01 ENCOUNTER — Encounter: Payer: Self-pay | Admitting: Physician Assistant

## 2015-11-01 ENCOUNTER — Encounter: Payer: Self-pay | Admitting: *Deleted

## 2015-11-01 VITALS — BP 144/85 | HR 84 | Temp 98.6°F | Wt 248.1 lb

## 2015-11-01 DIAGNOSIS — O99210 Obesity complicating pregnancy, unspecified trimester: Secondary | ICD-10-CM

## 2015-11-01 DIAGNOSIS — O10912 Unspecified pre-existing hypertension complicating pregnancy, second trimester: Secondary | ICD-10-CM | POA: Diagnosis present

## 2015-11-01 DIAGNOSIS — O0992 Supervision of high risk pregnancy, unspecified, second trimester: Secondary | ICD-10-CM

## 2015-11-01 DIAGNOSIS — E669 Obesity, unspecified: Secondary | ICD-10-CM

## 2015-11-01 LAB — POCT URINALYSIS DIP (DEVICE)
Glucose, UA: NEGATIVE mg/dL
KETONES UR: NEGATIVE mg/dL
Nitrite: NEGATIVE
Protein, ur: 30 mg/dL — AB
Specific Gravity, Urine: >=1.03 (ref 1.005–1.030)
Urobilinogen, UA: 0.2 mg/dL (ref 0.0–1.0)
pH: 6.5 (ref 5.0–8.0)

## 2015-11-01 NOTE — Progress Notes (Signed)
Pt would like to try something different for her stretch marks.

## 2015-11-01 NOTE — Patient Instructions (Signed)

## 2015-11-01 NOTE — Progress Notes (Signed)
Subjective:  [name] [name] is a 30 y.o. G1P0 at [redacted]w[redacted]d being seen today for ongoing prenatal care.  Her pregnancy is high risk for chronic hypertension that has been uncontrolled. 2 weeks ago her labetalol was increased to 600mg  bid and her blood pressure today is improved.  Patient reports continued rash and itching at her stretch marks, unhelped by triamcinolone given last appt.  Contractions: Not present.   . Movement: Present. Denies leaking of fluid.   The following portions of the patient's history were reviewed and updated as appropriate: allergies, current medications, past family history, past medical history, past social history, past surgical history and problem list.   Objective:   Filed Vitals:   11/01/15 1130  BP: 144/85  Pulse: 84  Temp: 98.6 F (37 C)  Weight: 248 lb 1.6 oz (112.537 kg)    Fetal Status: Fetal Heart Rate (bpm): 135   Movement: Present     General:  Alert, oriented and cooperative. Patient is in no acute distress.  Skin: Skin is warm and dry. No rash noted.   Cardiovascular: Normal heart rate noted  Respiratory: Normal respiratory effort, no problems with respiration noted  Abdomen: Soft, gravid, appropriate for gestational age. Pain/Pressure: Absent     Pelvic:       Cervical exam deferred        Extremities: Normal range of motion.     Mental Status: Normal mood and affect. Normal behavior. Normal judgment and thought content.   Urinalysis:      Assessment and Plan:  Pregnancy: G1P0 at [redacted]w[redacted]d  1. Supervision of high-risk pregnancy, second trimester Cont PNV Return in 2 weeks for third trimester glucose tolerance - 3 hour gtt - previously failed 1 hour but passed 3 hour  2.  Chronic hypertension, pregnancy: Continue current dose of labetalol (200mg  TID).  Bring 24 hour urine jug on Monday, 11/05/15.      Preterm labor symptoms and general obstetric precautions including but not limited to vaginal bleeding, contractions, leaking of fluid and fetal  movement were reviewed in detail with the patient. Please refer to After Visit Summary for other counseling recommendations.  Return in about 2 weeks (around 11/15/2015) for [facility].   Paticia Stack, PA-C

## 2015-11-15 ENCOUNTER — Encounter: Payer: Medicaid Other | Admitting: Obstetrics & Gynecology

## 2015-11-15 ENCOUNTER — Encounter: Payer: BLUE CROSS/BLUE SHIELD | Admitting: Obstetrics & Gynecology

## 2015-11-19 ENCOUNTER — Telehealth: Payer: Self-pay | Admitting: General Practice

## 2015-11-19 DIAGNOSIS — B373 Candidiasis of vulva and vagina: Secondary | ICD-10-CM

## 2015-11-19 DIAGNOSIS — B3731 Acute candidiasis of vulva and vagina: Secondary | ICD-10-CM

## 2015-11-19 MED ORDER — TERCONAZOLE 0.4 % VA CREA: 1.0000 | TOPICAL_CREAM | Freq: Every day | VAGINAL | Status: DC

## 2015-11-19 NOTE — Telephone Encounter (Signed)
Patient called and left message stating she is returning our call. Called patient and she states she has been having vaginal itching. Told patient we will refill her Rx to her pharmacy. Patient verbalized understanding. Told patient that it appears she missed her OB appt with [place] last week. Patient states she has been trying to get it rescheduled but hasn't heard from anyone. Told patient I will let the front office know and they will contact her to schedule that.  Patient verbalized understanding & asked if drinking soda could cause yeast infections. Told patient that they are several reasons women can get a yeast infection. Discussed that diabetes or a diet high in sugar can make someone more susceptible to yeast infections. Thus, drinking sodas or sweet tea in excess which contain a lot of sugar could cause yeast infections. Patient verbalized understanding & had no other questions

## 2015-11-19 NOTE — Telephone Encounter (Signed)
Patient called and left message stating she needs something called in for a yeast infection & BV. Patient states she has had this before & had prescriptions called in for this. Patient requests call back. Called patient, no answer- left message stating we are trying to reach you to return your phone call, please call us back at the clinics

## 2015-11-22 ENCOUNTER — Encounter: Payer: Self-pay | Admitting: Family Medicine

## 2015-11-22 ENCOUNTER — Encounter: Payer: Medicaid Other | Admitting: Family Medicine

## 2015-11-23 ENCOUNTER — Other Ambulatory Visit (HOSPITAL_COMMUNITY): Payer: Self-pay | Admitting: Maternal and Fetal Medicine

## 2015-11-23 ENCOUNTER — Encounter (HOSPITAL_COMMUNITY): Payer: Self-pay

## 2015-11-23 ENCOUNTER — Ambulatory Visit (HOSPITAL_COMMUNITY)
Admission: RE | Admit: 2015-11-23 | Discharge: 2015-11-23 | Disposition: A | Discharge status: Home / Self Care | Payer: BLUE CROSS/BLUE SHIELD | Source: Ambulatory Visit | Attending: Family Medicine | Admitting: Family Medicine

## 2015-11-23 DIAGNOSIS — Z0489 Encounter for examination and observation for other specified reasons: Secondary | ICD-10-CM

## 2015-11-23 DIAGNOSIS — IMO0002 Reserved for concepts with insufficient information to code with codable children: Secondary | ICD-10-CM

## 2015-11-23 DIAGNOSIS — O280 Abnormal hematological finding on antenatal screening of mother: Secondary | ICD-10-CM

## 2015-11-23 DIAGNOSIS — O99212 Obesity complicating pregnancy, second trimester: Secondary | ICD-10-CM

## 2015-11-23 DIAGNOSIS — O10019 Pre-existing essential hypertension complicating pregnancy, unspecified trimester: Secondary | ICD-10-CM | POA: Insufficient documentation

## 2015-11-23 DIAGNOSIS — O10919 Unspecified pre-existing hypertension complicating pregnancy, unspecified trimester: Secondary | ICD-10-CM

## 2015-11-23 DIAGNOSIS — Z3A26 26 weeks gestation of pregnancy: Secondary | ICD-10-CM | POA: Insufficient documentation

## 2015-11-29 ENCOUNTER — Encounter: Payer: BLUE CROSS/BLUE SHIELD | Admitting: Obstetrics and Gynecology

## 2015-11-30 ENCOUNTER — Ambulatory Visit (HOSPITAL_COMMUNITY)
Admission: RE | Admit: 2015-11-30 | Discharge: 2015-11-30 | Disposition: A | Discharge status: Home / Self Care | Payer: BLUE CROSS/BLUE SHIELD | Source: Ambulatory Visit | Attending: Family Medicine | Admitting: Family Medicine

## 2015-11-30 DIAGNOSIS — O10012 Pre-existing essential hypertension complicating pregnancy, second trimester: Secondary | ICD-10-CM | POA: Diagnosis present

## 2015-11-30 DIAGNOSIS — O10919 Unspecified pre-existing hypertension complicating pregnancy, unspecified trimester: Secondary | ICD-10-CM

## 2015-11-30 DIAGNOSIS — Z3A26 26 weeks gestation of pregnancy: Secondary | ICD-10-CM | POA: Insufficient documentation

## 2015-12-03 ENCOUNTER — Ambulatory Visit (INDEPENDENT_AMBULATORY_CARE_PROVIDER_SITE_OTHER): Payer: BLUE CROSS/BLUE SHIELD | Admitting: Obstetrics and Gynecology

## 2015-12-03 ENCOUNTER — Encounter: Payer: Self-pay | Admitting: Obstetrics and Gynecology

## 2015-12-03 VITALS — BP 139/92 | HR 89 | Temp 98.6°F | Wt 251.4 lb

## 2015-12-03 DIAGNOSIS — O99213 Obesity complicating pregnancy, third trimester: Secondary | ICD-10-CM | POA: Diagnosis not present

## 2015-12-03 DIAGNOSIS — O10913 Unspecified pre-existing hypertension complicating pregnancy, third trimester: Secondary | ICD-10-CM | POA: Diagnosis not present

## 2015-12-03 DIAGNOSIS — Z23 Encounter for immunization: Secondary | ICD-10-CM | POA: Diagnosis not present

## 2015-12-03 DIAGNOSIS — I159 Secondary hypertension, unspecified: Secondary | ICD-10-CM

## 2015-12-03 DIAGNOSIS — E669 Obesity, unspecified: Secondary | ICD-10-CM

## 2015-12-03 DIAGNOSIS — O0993 Supervision of high risk pregnancy, unspecified, third trimester: Secondary | ICD-10-CM

## 2015-12-03 DIAGNOSIS — N898 Other specified noninflammatory disorders of vagina: Secondary | ICD-10-CM

## 2015-12-03 LAB — CBC
HEMATOCRIT: 32.4 % — AB (ref 36.0–46.0)
HEMOGLOBIN: 10.8 g/dL — AB (ref 12.0–15.0)
MCH: 26.3 pg (ref 26.0–34.0)
MCHC: 33.3 g/dL (ref 30.0–36.0)
MCV: 78.8 fL (ref 78.0–100.0)
MPV: 9.3 fL (ref 8.6–12.4)
Platelets: 207 10*3/uL (ref 150–400)
RBC: 4.11 MIL/uL (ref 3.87–5.11)
RDW: 15.3 % (ref 11.5–15.5)
WBC: 8.7 10*3/uL (ref 4.0–10.5)

## 2015-12-03 LAB — POCT URINALYSIS DIP (DEVICE)
BILIRUBIN URINE: NEGATIVE
Glucose, UA: NEGATIVE mg/dL
Ketones, ur: NEGATIVE mg/dL
NITRITE: NEGATIVE
PH: 7 (ref 5.0–8.0)
Protein, ur: 30 mg/dL — AB
Specific Gravity, Urine: 1.02 (ref 1.005–1.030)
UROBILINOGEN UA: 0.2 mg/dL (ref 0.0–1.0)

## 2015-12-03 MED ORDER — TETANUS-DIPHTH-ACELL PERTUSSIS 5-2.5-18.5 LF-MCG/0.5 IM SUSP
0.5000 mL | Freq: Once | INTRAMUSCULAR | Status: AC
  Administered 2015-12-03: 0.5 mL via INTRAMUSCULAR

## 2015-12-03 NOTE — Addendum Note (Signed)
Addended by: Mora Bellman on: 12/03/2015 09:24 AM   Modules accepted: Orders

## 2015-12-03 NOTE — Progress Notes (Signed)
Baseline labs today with P:C ratio

## 2015-12-03 NOTE — Addendum Note (Signed)
Addended by: Riccardo Dubin on: 12/03/2015 03:55 PM   Modules accepted: Orders

## 2015-12-03 NOTE — Progress Notes (Signed)
Pt reports not taking BP medication this am due to fasting for 3 hr gtt  Educated pt on Skin to Skin  28wk packet given

## 2015-12-03 NOTE — Progress Notes (Signed)
Subjective:  [name] [name] is a 30 y.o. G1P0 at [redacted]w[redacted]d being seen today for ongoing prenatal care.  She is currently monitored for the following issues for this high-risk pregnancy and has Supervision of high-risk pregnancy; HTN in pregnancy, chronic; HTN (hypertension); Obesity affecting pregnancy, antepartum; Obesity; Abnormal quad screen; and Hyperemesis gravidarum on her problem list.  Patient reports vaginal pruritis and the presence of an odorless discharge.  Contractions: Not present.  .  Movement: Present. Denies leaking of fluid.   The following portions of the patient's history were reviewed and updated as appropriate: allergies, current medications, past family history, past medical history, past social history, past surgical history and problem list. Problem list updated.  Objective:   Filed Vitals:   12/03/15 0852 12/03/15 0855  BP: 147/114 139/92  Pulse: 99 89  Temp: 98.6 F (37 C)   Weight: 251 lb 6.4 oz (114.034 kg)     Fetal Status: Fetal Heart Rate (bpm): 144   Movement: Present     General:  Alert, oriented and cooperative. Patient is in no acute distress.  Skin: Skin is warm and dry. No rash noted.   Cardiovascular: Normal heart rate noted  Respiratory: Normal respiratory effort, no problems with respiration noted  Abdomen: Soft, gravid, appropriate for gestational age. Pain/Pressure: Present     Pelvic:   Vag D/C Character: Curdy   Cervical exam deferred        Extremities: Normal range of motion.  Edema: None  Mental Status: Normal mood and affect. Normal behavior. Normal judgment and thought content.   Urinalysis:      Assessment and Plan:  Pregnancy: G1P0 at [redacted]w[redacted]d  1. HTN in pregnancy, chronic, third trimester Patient to resume BP meds today Follow up growth [place] on 3/9  2. Obesity affecting pregnancy, antepartum, third trimester   3. Supervision of high-risk pregnancy, third trimester - Wet prep collected - glucola, labs, tdap today - Glucose  tolerance, 3 hours  4. Secondary hypertension, unspecified   Preterm labor symptoms and general obstetric precautions including but not limited to vaginal bleeding, contractions, leaking of fluid and fetal movement were reviewed in detail with the patient. Please refer to After Visit Summary for other counseling recommendations.  Return in about 2 weeks (around 12/17/2015).   Mora Bellman, MD

## 2015-12-04 ENCOUNTER — Telehealth: Payer: Self-pay

## 2015-12-04 LAB — WET PREP, GENITAL: TRICH WET PREP: NONE SEEN

## 2015-12-04 LAB — COMPREHENSIVE METABOLIC PANEL
ALBUMIN: 3 g/dL — AB (ref 3.6–5.1)
ALT: 8 U/L (ref 6–29)
AST: 10 U/L (ref 10–30)
Alkaline Phosphatase: 57 U/L (ref 33–115)
BUN: 9 mg/dL (ref 7–25)
CALCIUM: 8.3 mg/dL — AB (ref 8.6–10.2)
CHLORIDE: 105 mmol/L (ref 98–110)
CO2: 23 mmol/L (ref 20–31)
Creat: 0.64 mg/dL (ref 0.50–1.10)
GLUCOSE: 168 mg/dL — AB (ref 65–99)
POTASSIUM: 3.8 mmol/L (ref 3.5–5.3)
Sodium: 136 mmol/L (ref 135–146)
Total Bilirubin: 0.3 mg/dL (ref 0.2–1.2)
Total Protein: 5.7 g/dL — ABNORMAL LOW (ref 6.1–8.1)

## 2015-12-04 LAB — PROTEIN / CREATININE RATIO, URINE
Creatinine, Urine: 282 mg/dL (ref 20–320)
Protein Creatinine Ratio: 92 mg/g creat (ref 21–161)
TOTAL PROTEIN, URINE: 26 mg/dL — AB (ref 5–24)

## 2015-12-04 LAB — GLUCOSE TOLERANCE, 3 HOURS
GLUCOSE 3 HOUR GTT: 139 mg/dL (ref <145)
Glucose Tolerance, 1 hour: 160 mg/dL (ref <190)
Glucose Tolerance, 2 hour: 140 mg/dL (ref <165)
Glucose Tolerance, Fasting: 88 mg/dL (ref 65–104)

## 2015-12-04 LAB — HIV ANTIBODY (ROUTINE TESTING W REFLEX): HIV 1&2 Ab, 4th Generation: NONREACTIVE

## 2015-12-04 LAB — RPR

## 2015-12-04 MED ORDER — METRONIDAZOLE 500 MG PO TABS: 500.0000 mg | ORAL_TABLET | Freq: Two times a day (BID) | ORAL | Status: DC

## 2015-12-04 MED ORDER — FLUCONAZOLE 150 MG PO TABS: 150.0000 mg | ORAL_TABLET | Freq: Once | ORAL | Status: DC

## 2015-12-04 NOTE — Addendum Note (Signed)
Addended by: Mora Bellman on: 12/04/2015 01:51 PM   Modules accepted: Orders

## 2015-12-04 NOTE — Telephone Encounter (Signed)
Please inform patient of positive yeast and BV infection. Flagyl and diflucan e-prescribed   I have left a detailed message for patient to cal [place] back

## 2015-12-04 NOTE — Telephone Encounter (Signed)
Pt has been informed of BV and yeast

## 2015-12-17 ENCOUNTER — Telehealth: Payer: Self-pay | Admitting: *Deleted

## 2015-12-17 NOTE — Telephone Encounter (Addendum)
Pt left message stating that she thinks she has a hemorrhoid. She requests a prescription for this problem.  3/7  0820  Per chart review, pt has scheduled clinic appt today @ 1440 for this problem.

## 2015-12-18 ENCOUNTER — Ambulatory Visit (INDEPENDENT_AMBULATORY_CARE_PROVIDER_SITE_OTHER): Payer: BLUE CROSS/BLUE SHIELD | Admitting: Certified Nurse Midwife

## 2015-12-18 VITALS — BP 122/69 | HR 88 | Temp 98.1°F | Wt 251.8 lb

## 2015-12-18 DIAGNOSIS — O0993 Supervision of high risk pregnancy, unspecified, third trimester: Secondary | ICD-10-CM

## 2015-12-18 DIAGNOSIS — O10913 Unspecified pre-existing hypertension complicating pregnancy, third trimester: Secondary | ICD-10-CM

## 2015-12-18 DIAGNOSIS — O280 Abnormal hematological finding on antenatal screening of mother: Secondary | ICD-10-CM

## 2015-12-18 DIAGNOSIS — O99213 Obesity complicating pregnancy, third trimester: Secondary | ICD-10-CM

## 2015-12-18 DIAGNOSIS — E669 Obesity, unspecified: Secondary | ICD-10-CM

## 2015-12-18 DIAGNOSIS — O289 Unspecified abnormal findings on antenatal screening of mother: Secondary | ICD-10-CM

## 2015-12-18 LAB — POCT URINALYSIS DIP (DEVICE)
Glucose, UA: NEGATIVE mg/dL
Ketones, ur: 40 mg/dL — AB
Nitrite: NEGATIVE
Protein, ur: 30 mg/dL — AB
Specific Gravity, Urine: 1.015 (ref 1.005–1.030)
Urobilinogen, UA: 0.2 mg/dL (ref 0.0–1.0)
pH: 7.5 (ref 5.0–8.0)

## 2015-12-18 NOTE — Progress Notes (Signed)
Subjective:  [name] [name] is a 30 y.o. G1P0 at [redacted]w[redacted]d being seen today for ongoing prenatal care.  She is currently monitored for the following issues for this high-risk pregnancy and has Supervision of high-risk pregnancy; HTN in pregnancy, chronic; HTN (hypertension); Obesity affecting pregnancy, antepartum; Obesity; Abnormal quad screen; and Hyperemesis gravidarum on her problem list.  Patient reports no complaints.  Contractions: Not present. Vag. Bleeding: None.  Movement: Present. Denies leaking of fluid.   The following portions of the patient's history were reviewed and updated as appropriate: allergies, current medications, past family history, past medical history, past social history, past surgical history and problem list. Problem list updated.  Objective:   Filed Vitals:   12/18/15 1504  BP: 122/69  Pulse: 88  Temp: 98.1 F (36.7 C)  Weight: 251 lb 12.8 oz (114.216 kg)    Fetal Status: Fetal Heart Rate (bpm): 138   Movement: Present     General:  Alert, oriented and cooperative. Patient is in no acute distress.  Skin: Skin is warm and dry. No rash noted.   Cardiovascular: Normal heart rate noted  Respiratory: Normal respiratory effort, no problems with respiration noted  Abdomen: Soft, gravid, appropriate for gestational age. Pain/Pressure: Present     Pelvic: Vag. Bleeding: None Vag D/C Character: White   Cervical exam deferred        Extremities: Normal range of motion.  Edema: Trace  Mental Status: Normal mood and affect. Normal behavior. Normal judgment and thought content.   Urinalysis:      Assessment and Plan:  Pregnancy: G1P0 at [redacted]w[redacted]d  1. Supervision of high-risk pregnancy, third trimester Schedule follow up u/s  2. Abnormal quad screen   Preterm labor symptoms and general obstetric precautions including but not limited to vaginal bleeding, contractions, leaking of fluid and fetal movement were reviewed in detail with the patient. Please refer to After  Visit Summary for other counseling recommendations.  Return in about 2 weeks (around 01/01/2016).   Larey Days, CNM

## 2015-12-18 NOTE — Patient Instructions (Signed)

## 2015-12-18 NOTE — Progress Notes (Signed)
Pt report hemorroids;  Used tucks pad since Sunday

## 2015-12-18 NOTE — Progress Notes (Signed)
Subjective:  [name] [name] is a 30 y.o. G1P0 at [redacted]w[redacted]d being seen today for ongoing prenatal care.  She is currently monitored for the following issues for this low-risk pregnancy and has Supervision of high-risk pregnancy; HTN in pregnancy, chronic; HTN (hypertension); Obesity affecting pregnancy, antepartum; Obesity; Abnormal quad screen; and Hyperemesis gravidarum on her problem list.  Patient reports hemorhoids.  Contractions: Not present. Vag. Bleeding: None.  Movement: Present. Denies leaking of fluid.   The following portions of the patient's history were reviewed and updated as appropriate: allergies, current medications, past family history, past medical history, past social history, past surgical history and problem list. Problem list updated.  Objective:   Filed Vitals:   12/18/15 1504  BP: 122/69  Pulse: 88  Temp: 98.1 F (36.7 C)  Weight: 251 lb 12.8 oz (114.216 kg)    Fetal Status: Fetal Heart Rate (bpm): 138 Fundal Height: 29 cm Movement: Present     General:  Alert, oriented and cooperative. Patient is in no acute distress.  Skin: Skin is warm and dry. No rash noted.   Cardiovascular: Normal heart rate noted  Respiratory: Normal respiratory effort, no problems with respiration noted  Abdomen: Soft, gravid, appropriate for gestational age. Pain/Pressure: Present     Pelvic: Vag. Bleeding: None Vag D/C Character: White   Cervical exam deferred        Extremities: Normal range of motion.  Edema: Trace  Mental Status: Normal mood and affect. Normal behavior. Normal judgment and thought content.   Urinalysis:      Assessment and Plan:  Pregnancy: G1P0 at [redacted]w[redacted]d  1. Supervision of high-risk pregnancy, third trimester Advised otc colace and preperation H  2. Abnormal quad screen   Preterm labor symptoms and general obstetric precautions including but not limited to vaginal bleeding, contractions, leaking of fluid and fetal movement were reviewed in detail with the  patient. Please refer to After Visit Summary for other counseling recommendations.  Return in about 2 weeks (around 01/01/2016).   Larey Days, CNM

## 2015-12-20 ENCOUNTER — Other Ambulatory Visit (HOSPITAL_COMMUNITY): Payer: Self-pay | Admitting: Maternal and Fetal Medicine

## 2015-12-20 ENCOUNTER — Other Ambulatory Visit: Payer: BLUE CROSS/BLUE SHIELD

## 2015-12-20 ENCOUNTER — Other Ambulatory Visit (HOSPITAL_COMMUNITY): Payer: Self-pay | Admitting: *Deleted

## 2015-12-20 ENCOUNTER — Encounter (HOSPITAL_COMMUNITY): Payer: Self-pay

## 2015-12-20 ENCOUNTER — Ambulatory Visit (HOSPITAL_COMMUNITY)
Admission: RE | Admit: 2015-12-20 | Discharge: 2015-12-20 | Disposition: A | Discharge status: Home / Self Care | Payer: BLUE CROSS/BLUE SHIELD | Source: Ambulatory Visit | Attending: Maternal and Fetal Medicine | Admitting: Maternal and Fetal Medicine

## 2015-12-20 VITALS — BP 157/99 | HR 93 | Wt 252.0 lb

## 2015-12-20 DIAGNOSIS — O10013 Pre-existing essential hypertension complicating pregnancy, third trimester: Secondary | ICD-10-CM | POA: Insufficient documentation

## 2015-12-20 DIAGNOSIS — Z3A29 29 weeks gestation of pregnancy: Secondary | ICD-10-CM | POA: Diagnosis not present

## 2015-12-20 DIAGNOSIS — O99213 Obesity complicating pregnancy, third trimester: Secondary | ICD-10-CM

## 2015-12-20 DIAGNOSIS — O169 Unspecified maternal hypertension, unspecified trimester: Secondary | ICD-10-CM

## 2015-12-20 DIAGNOSIS — O10919 Unspecified pre-existing hypertension complicating pregnancy, unspecified trimester: Secondary | ICD-10-CM

## 2015-12-20 DIAGNOSIS — O283 Abnormal ultrasonic finding on antenatal screening of mother: Secondary | ICD-10-CM | POA: Insufficient documentation

## 2015-12-20 DIAGNOSIS — O289 Unspecified abnormal findings on antenatal screening of mother: Secondary | ICD-10-CM

## 2015-12-20 DIAGNOSIS — O163 Unspecified maternal hypertension, third trimester: Secondary | ICD-10-CM

## 2015-12-20 DIAGNOSIS — O10913 Unspecified pre-existing hypertension complicating pregnancy, third trimester: Secondary | ICD-10-CM

## 2015-12-20 LAB — COMPREHENSIVE METABOLIC PANEL
ALBUMIN: 3.3 g/dL — AB (ref 3.6–5.1)
ALK PHOS: 63 U/L (ref 33–115)
ALT: 7 U/L (ref 6–29)
AST: 10 U/L (ref 10–30)
BILIRUBIN TOTAL: 0.4 mg/dL (ref 0.2–1.2)
BUN: 8 mg/dL (ref 7–25)
CALCIUM: 8.5 mg/dL — AB (ref 8.6–10.2)
CO2: 21 mmol/L (ref 20–31)
Chloride: 103 mmol/L (ref 98–110)
Creat: 0.56 mg/dL (ref 0.50–1.10)
Glucose, Bld: 91 mg/dL (ref 65–99)
POTASSIUM: 3.8 mmol/L (ref 3.5–5.3)
Sodium: 135 mmol/L (ref 135–146)
TOTAL PROTEIN: 5.9 g/dL — AB (ref 6.1–8.1)

## 2015-12-20 LAB — CBC
HCT: 31.4 % — ABNORMAL LOW (ref 36.0–46.0)
Hemoglobin: 10.6 g/dL — ABNORMAL LOW (ref 12.0–15.0)
MCH: 26.4 pg (ref 26.0–34.0)
MCHC: 33.8 g/dL (ref 30.0–36.0)
MCV: 78.1 fL (ref 78.0–100.0)
MPV: 9 fL (ref 8.6–12.4)
Platelets: 218 10*3/uL (ref 150–400)
RBC: 4.02 MIL/uL (ref 3.87–5.11)
RDW: 14.7 % (ref 11.5–15.5)
WBC: 8.3 10*3/uL (ref 4.0–10.5)

## 2015-12-20 LAB — PROTEIN / CREATININE RATIO, URINE
CREATININE, URINE: 323 mg/dL — AB (ref 20–320)
PROTEIN CREATININE RATIO: 87 mg/g{creat} (ref 21–161)
Total Protein, Urine: 28 mg/dL — ABNORMAL HIGH (ref 5–24)

## 2015-12-20 NOTE — Progress Notes (Signed)
Received report from [place], [facility], in regards to pt's CBC.  Notified Dr. Elly Modena- no new orders.

## 2015-12-21 ENCOUNTER — Telehealth: Payer: Self-pay | Admitting: General Practice

## 2015-12-21 DIAGNOSIS — B3731 Acute candidiasis of vulva and vagina: Secondary | ICD-10-CM

## 2015-12-21 DIAGNOSIS — B373 Candidiasis of vulva and vagina: Secondary | ICD-10-CM

## 2015-12-21 MED ORDER — TERCONAZOLE 0.4 % VA CREA: 1.0000 | TOPICAL_CREAM | Freq: Every day | VAGINAL | Status: DC

## 2015-12-21 NOTE — Telephone Encounter (Signed)
Per Dr Gala Romney, patient's labs were normal, but patient needs to make sure she is taking her BP meds as directed. Called patient and discussed with her. Patient verbalized understanding & states she was about to call us because she is experiencing a yeast infection again with vaginal itching. Told patient we will refill the terazol cream to her pharmacy. Patient verbalized understanding & had no other questions

## 2015-12-27 ENCOUNTER — Ambulatory Visit (HOSPITAL_COMMUNITY): Payer: BLUE CROSS/BLUE SHIELD

## 2015-12-29 ENCOUNTER — Encounter (HOSPITAL_COMMUNITY): Payer: Self-pay | Admitting: *Deleted

## 2015-12-29 ENCOUNTER — Inpatient Hospital Stay (HOSPITAL_COMMUNITY)
Admission: AD | Admit: 2015-12-29 | Discharge: 2015-12-29 | Disposition: A | Discharge status: Home / Self Care | Payer: Medicaid Other | Source: Ambulatory Visit | Attending: Obstetrics & Gynecology | Admitting: Obstetrics & Gynecology

## 2015-12-29 DIAGNOSIS — B3731 Acute candidiasis of vulva and vagina: Secondary | ICD-10-CM

## 2015-12-29 DIAGNOSIS — B373 Candidiasis of vulva and vagina: Secondary | ICD-10-CM

## 2015-12-29 DIAGNOSIS — Z7982 Long term (current) use of aspirin: Secondary | ICD-10-CM | POA: Insufficient documentation

## 2015-12-29 DIAGNOSIS — A499 Bacterial infection, unspecified: Secondary | ICD-10-CM

## 2015-12-29 DIAGNOSIS — O4693 Antepartum hemorrhage, unspecified, third trimester: Secondary | ICD-10-CM

## 2015-12-29 DIAGNOSIS — O23593 Infection of other part of genital tract in pregnancy, third trimester: Secondary | ICD-10-CM | POA: Diagnosis not present

## 2015-12-29 DIAGNOSIS — O98813 Other maternal infectious and parasitic diseases complicating pregnancy, third trimester: Secondary | ICD-10-CM

## 2015-12-29 DIAGNOSIS — N760 Acute vaginitis: Secondary | ICD-10-CM

## 2015-12-29 DIAGNOSIS — O163 Unspecified maternal hypertension, third trimester: Secondary | ICD-10-CM | POA: Diagnosis not present

## 2015-12-29 DIAGNOSIS — Z9114 Patient's other noncompliance with medication regimen: Secondary | ICD-10-CM | POA: Diagnosis not present

## 2015-12-29 DIAGNOSIS — Z3A31 31 weeks gestation of pregnancy: Secondary | ICD-10-CM | POA: Insufficient documentation

## 2015-12-29 DIAGNOSIS — O468X3 Other antepartum hemorrhage, third trimester: Secondary | ICD-10-CM | POA: Diagnosis not present

## 2015-12-29 DIAGNOSIS — B9689 Other specified bacterial agents as the cause of diseases classified elsewhere: Secondary | ICD-10-CM

## 2015-12-29 DIAGNOSIS — N898 Other specified noninflammatory disorders of vagina: Secondary | ICD-10-CM | POA: Diagnosis not present

## 2015-12-29 LAB — CBC
HCT: 33.5 % — ABNORMAL LOW (ref 36.0–46.0)
Hemoglobin: 11 g/dL — ABNORMAL LOW (ref 12.0–15.0)
MCH: 25.9 pg — ABNORMAL LOW (ref 26.0–34.0)
MCHC: 32.8 g/dL (ref 30.0–36.0)
MCV: 79 fL (ref 78.0–100.0)
Platelets: 231 K/uL (ref 150–400)
RBC: 4.24 MIL/uL (ref 3.87–5.11)
RDW: 14.6 % (ref 11.5–15.5)
WBC: 9.2 K/uL (ref 4.0–10.5)

## 2015-12-29 LAB — COMPREHENSIVE METABOLIC PANEL WITH GFR
ALT: 10 U/L — ABNORMAL LOW (ref 14–54)
AST: 14 U/L — ABNORMAL LOW (ref 15–41)
Albumin: 3 g/dL — ABNORMAL LOW (ref 3.5–5.0)
Alkaline Phosphatase: 71 U/L (ref 38–126)
Anion gap: 7 (ref 5–15)
BUN: 7 mg/dL (ref 6–20)
CO2: 23 mmol/L (ref 22–32)
Calcium: 8.5 mg/dL — ABNORMAL LOW (ref 8.9–10.3)
Chloride: 107 mmol/L (ref 101–111)
Creatinine, Ser: 0.58 mg/dL (ref 0.44–1.00)
GFR calc Af Amer: >60 mL/min
GFR calc non Af Amer: >60 mL/min
Glucose, Bld: 86 mg/dL (ref 65–99)
Potassium: 3.9 mmol/L (ref 3.5–5.1)
Sodium: 137 mmol/L (ref 135–145)
Total Bilirubin: 0.4 mg/dL (ref 0.3–1.2)
Total Protein: 6.4 g/dL — ABNORMAL LOW (ref 6.5–8.1)

## 2015-12-29 LAB — PROTEIN / CREATININE RATIO, URINE
Creatinine, Urine: 304 mg/dL
PROTEIN CREATININE RATIO: 0.14 mg/mg{creat} (ref 0.00–0.15)
TOTAL PROTEIN, URINE: 44 mg/dL

## 2015-12-29 LAB — WET PREP, GENITAL
Sperm: NONE SEEN
Sperm: NONE SEEN
TRICH WET PREP: NONE SEEN
Trich, Wet Prep: NONE SEEN
Yeast Wet Prep HPF POC: NONE SEEN

## 2015-12-29 LAB — URINALYSIS, ROUTINE W REFLEX MICROSCOPIC
Bilirubin Urine: NEGATIVE
Glucose, UA: NEGATIVE mg/dL
KETONES UR: NEGATIVE mg/dL
NITRITE: NEGATIVE
PH: 7.5 (ref 5.0–8.0)
Protein, ur: 30 mg/dL — AB
SPECIFIC GRAVITY, URINE: 1.015 (ref 1.005–1.030)

## 2015-12-29 LAB — URINE MICROSCOPIC-ADD ON

## 2015-12-29 MED ORDER — METRONIDAZOLE 0.75 % VA GEL: 1.0000 | Freq: Two times a day (BID) | VAGINAL | Status: AC

## 2015-12-29 MED ORDER — METHYLDOPA 250 MG PO TABS
250.0000 mg | ORAL_TABLET | Freq: Two times a day (BID) | ORAL | Status: DC
  Administered 2015-12-29: 250 mg via ORAL
  Filled 2015-12-29: qty 1

## 2015-12-29 MED ORDER — HYDRALAZINE HCL 20 MG/ML IJ SOLN
10.0000 mg | Freq: Once | INTRAMUSCULAR | Status: DC | PRN
  Filled 2015-12-29: qty 1

## 2015-12-29 MED ORDER — FLUCONAZOLE 150 MG PO TABS: 150.0000 mg | ORAL_TABLET | Freq: Every day | ORAL | Status: DC

## 2015-12-29 MED ORDER — LABETALOL HCL 100 MG PO TABS
600.0000 mg | ORAL_TABLET | Freq: Three times a day (TID) | ORAL | Status: DC
  Administered 2015-12-29: 600 mg via ORAL
  Filled 2015-12-29: qty 6

## 2015-12-29 MED ORDER — BETAMETHASONE SOD PHOS & ACET 6 (3-3) MG/ML IJ SUSP
12.0000 mg | INTRAMUSCULAR | Status: DC
  Filled 2015-12-29: qty 2

## 2015-12-29 MED ORDER — LACTATED RINGERS IV SOLN
INTRAVENOUS | Status: DC
  Administered 2015-12-29 (×2): via INTRAVENOUS

## 2015-12-29 MED ORDER — BETAMETHASONE SOD PHOS & ACET 6 (3-3) MG/ML IJ SUSP
12.0000 mg | Freq: Once | INTRAMUSCULAR | Status: AC
  Administered 2015-12-29: 12 mg via INTRAMUSCULAR
  Filled 2015-12-29: qty 2

## 2015-12-29 MED ORDER — LABETALOL HCL 5 MG/ML IV SOLN
20.0000 mg | INTRAVENOUS | Status: DC | PRN
Start: 2015-12-29 — End: 2015-12-29
  Administered 2015-12-29: 20 mg via INTRAVENOUS
  Administered 2015-12-29: 40 mg via INTRAVENOUS
  Filled 2015-12-29: qty 8
  Filled 2015-12-29: qty 16
  Filled 2015-12-29: qty 4

## 2015-12-29 NOTE — Progress Notes (Signed)
Dr. Gerarda Fraction notified of latest blood pressure and why hydralazine was held.  Provider states to discontinue recurring blood pressures and she will come educate the pt about her discharge.

## 2015-12-29 NOTE — Progress Notes (Signed)
Dr. Gerarda Fraction states since pressure keep going up to go ahead and give the IV hydralazine.

## 2015-12-29 NOTE — Progress Notes (Signed)
Dr. Gerarda Fraction updated on latest blood pressures.  Provider notified that another blood pressure will be taken in 15 minutes.  Provider notified that second wet prep results were back.

## 2015-12-29 NOTE — Progress Notes (Addendum)
Dr. Gerarda Fraction notified of pt in MAU.  Notified of a G1P0 at [redacted]w[redacted]d who came in with complaints of bright red bleeding.  Notified that the bleeding was the size of a quarter on her sheets and she had an additional small amount of bleeding when she went to the bathroom.  Notified that pt has a BP of 175/113 and a pulse of 104.  Notified that pt is on cycling blood pressures.  Notified that pt has a history of high blood pressure before pregnancy that has continued during pregnancy.  Notified that pt states she did not take her blood pressure medications this morning.  Provider states she will put in orders and come see the pt.

## 2015-12-29 NOTE — Progress Notes (Signed)
Dr. Gerarda Fraction called and states she put in orders for the pt's home blood pressure medications.  Provider states to hold off giving the medications until she talks to Dr. Si Raider.  Provider states she will call back and state if she wants to give the medications.

## 2015-12-29 NOTE — MAU Provider Note (Signed)
History     CSN: [id number]  Arrival date and time: 12/29/15 [id number]   First Provider Initiated Contact with Patient 12/29/15 [phone]      Chief Complaint  Patient presents with  . Vaginal Bleeding     HPI  Patient is 30 y.o. G1P0 [redacted]w[redacted]d here with complaints of vaginal bleeding. She states that she noticed bleeding when she woke up this morning. Bleeding is minimal; "quarter size".  States that when she went to the bathroom she also had blood in the toilet and on the tissue when she wiped. She endorses continued bleeding noticed on sanitary pad. Patient states she had one prior episode of bleeding in her first trimester. It also was light and resolved without intervention. Normal placenta placement per patient.   Of note, patient noted to have severely elevated BPs on admission. She has h/o cHTN that is treated at home with labetalol and methyldopa. Patient states she has not taken her blood pressure medications in the last couple of days. Denies PIH symptoms.   Also patient states she has been treated throughout this pregnancy for yeats infection that is not resolving. With this she will have white thick vaginal discharge.   +FM, denies LOF, contractions    OB History    Gravida Para Term Preterm AB TAB SAB Ectopic Multiple Living   1             HRC cHTN Obesity  Past Medical History  Diagnosis Date  . Hypertension   . Obesity in pregnancy     Antepartum, first trimester  . URI (upper respiratory infection)   . Condyloma acuminata     Past Surgical History  Procedure Laterality Date  . No past surgeries      Family History  Problem Relation Age of Onset  . Hypertension Mother   . Heart disease Mother     Social History  Substance Use Topics  . Smoking status: Never Smoker   . Smokeless tobacco: Never Used  . Alcohol Use: No    Allergies: No Known Allergies  Prescriptions prior to admission  Medication Sig Dispense Refill Last Dose  . aspirin EC 81 MG tablet  Take 1 tablet (81 mg total) by mouth daily. 90 tablet 3 Past Week at Unknown time  . labetalol (NORMODYNE) 200 MG tablet Take 3 tablets (600 mg total) by mouth 3 (three) times daily. 270 tablet 3 Past Week at Unknown time  . methyldopa (ALDOMET) 250 MG tablet Take 1 tablet (250 mg total) by mouth 2 (two) times daily. 60 tablet 6 Past Week at Unknown time  . fluconazole (DIFLUCAN) 150 MG tablet Take 1 tablet (150 mg total) by mouth once. (Patient not taking: Reported on 12/20/2015) 1 tablet 0 Completed Course at Unknown time  . metroNIDAZOLE (FLAGYL) 500 MG tablet Take 1 tablet (500 mg total) by mouth 2 (two) times daily. (Patient not taking: Reported on 12/20/2015) 14 tablet 0 Completed Course at Unknown time  . terconazole (TERAZOL 7) 0.4 % vaginal cream Place 1 applicator vaginally at bedtime. For three nights. (Patient not taking: Reported on 12/29/2015) 45 g 0 Completed Course at Unknown time  . triamcinolone cream (KENALOG) 0.1 % Apply 1 application topically 2 (two) times daily. 30 g 0 prn    Review of Systems  Constitutional: Negative for fever and chills.  Eyes: Negative for blurred vision and double vision.  Respiratory: Negative for shortness of breath.   Cardiovascular: Positive for leg swelling. Negative for chest pain.  Gastrointestinal: Negative for nausea, vomiting and abdominal pain.  Genitourinary: Negative for dysuria.  Neurological: Negative for dizziness and headaches.  Also per HPI  Physical Exam   Blood pressure 182/120, pulse 113, temperature 98.8 F (37.1 C), temperature source Oral, resp. rate 18, last menstrual period 05/25/2015.  Physical Exam  Constitutional: She is oriented to person, place, and time. She appears well-developed and well-nourished. No distress.  HENT:  Head: Normocephalic and atraumatic.  Eyes: Conjunctivae and EOM are normal.  Cardiovascular: Regular rhythm, normal heart sounds and intact distal pulses.   Respiratory: Effort normal and breath  sounds normal.  GI: Soft. Bowel sounds are normal. There is no tenderness.  gravid  Genitourinary: There is bleeding in the vagina. Vaginal discharge found.  Musculoskeletal: Normal range of motion. She exhibits no edema or tenderness.  Neurological: She is alert and oriented to person, place, and time.  Non-focal  Skin: Skin is warm and dry.    Results for orders placed or performed during the hospital encounter of 12/29/15 (from the past 24 hour(s))  Urinalysis, Routine w reflex microscopic (not at [facility])     Status: Abnormal   Collection Time: 12/29/15  9:05 AM  Result Value Ref Range   Color, Urine YELLOW YELLOW   APPearance CLEAR CLEAR   Specific Gravity, Urine 1.015 1.005 - 1.030   pH 7.5 5.0 - 8.0   Glucose, UA NEGATIVE NEGATIVE mg/dL   Hgb urine dipstick LARGE (A) NEGATIVE   Bilirubin Urine NEGATIVE NEGATIVE   Ketones, ur NEGATIVE NEGATIVE mg/dL   Protein, ur 30 (A) NEGATIVE mg/dL   Nitrite NEGATIVE NEGATIVE   Leukocytes, UA MODERATE (A) NEGATIVE  Protein / creatinine ratio, urine     Status: None   Collection Time: 12/29/15  9:05 AM  Result Value Ref Range   Creatinine, Urine 304.00 mg/dL   Total Protein, Urine 44 mg/dL   Protein Creatinine Ratio 0.14 0.00 - 0.15 mg/mg[Cre]  Urine microscopic-add on     Status: Abnormal   Collection Time: 12/29/15  9:05 AM  Result Value Ref Range   Squamous Epithelial / LPF 6-30 (A) NONE SEEN   WBC, UA 6-30 0 - 5 WBC/hpf   RBC / HPF 6-30 0 - 5 RBC/hpf   Bacteria, UA FEW (A) NONE SEEN   Urine-Other MUCOUS PRESENT   CBC     Status: Abnormal   Collection Time: 12/29/15  9:50 AM  Result Value Ref Range   WBC 9.2 4.0 - 10.5 K/uL   RBC 4.24 3.87 - 5.11 MIL/uL   Hemoglobin 11.0 (L) 12.0 - 15.0 g/dL   HCT 33.5 (L) 36.0 - 46.0 %   MCV 79.0 78.0 - 100.0 fL   MCH 25.9 (L) 26.0 - 34.0 pg   MCHC 32.8 30.0 - 36.0 g/dL   RDW 14.6 11.5 - 15.5 %   Platelets 231 150 - 400 K/uL  Comprehensive metabolic panel     Status: Abnormal   Collection  Time: 12/29/15  9:50 AM  Result Value Ref Range   Sodium 137 135 - 145 mmol/L   Potassium 3.9 3.5 - 5.1 mmol/L   Chloride 107 101 - 111 mmol/L   CO2 23 22 - 32 mmol/L   Glucose, Bld 86 65 - 99 mg/dL   BUN 7 6 - 20 mg/dL   Creatinine, Ser 0.58 0.44 - 1.00 mg/dL   Calcium 8.5 (L) 8.9 - 10.3 mg/dL   Total Protein 6.4 (L) 6.5 - 8.1 g/dL   Albumin 3.0 (L)  3.5 - 5.0 g/dL   AST 14 (L) 15 - 41 U/L   ALT 10 (L) 14 - 54 U/L   Alkaline Phosphatase 71 38 - 126 U/L   Total Bilirubin 0.4 0.3 - 1.2 mg/dL   GFR calc non Af Amer >60 >60 mL/min   GFR calc Af Amer >60 >60 mL/min   Anion gap 7 5 - 15  Wet prep, genital     Status: Abnormal   Collection Time: 12/29/15  9:57 AM  Result Value Ref Range   Yeast Wet Prep HPF POC NONE SEEN NONE SEEN   Trich, Wet Prep NONE SEEN NONE SEEN   Clue Cells Wet Prep HPF POC PRESENT (A) NONE SEEN   WBC, Wet Prep HPF POC MODERATE (A) NONE SEEN   Sperm NONE SEEN   Wet prep, genital     Status: Abnormal   Collection Time: 12/29/15 10:51 AM  Result Value Ref Range   Yeast Wet Prep HPF POC PRESENT (A) NONE SEEN   Trich, Wet Prep NONE SEEN NONE SEEN   Clue Cells Wet Prep HPF POC PRESENT (A) NONE SEEN   WBC, Wet Prep HPF POC TOO NUMEROUS TO COUNT (A) NONE SEEN   Sperm NONE SEEN    Dilation: Closed Effacement (%): Thick Exam by:: Dr. Si Raider   MAU Course  Procedures - None  MDM Reviews last [place] on 3/9 - no previa Sterile speculum exam - brownish thick discharge, closed os NST - reactive and reassuring  Assessment and Plan  A: Patient is 30 y.o. G1P0 [redacted]w[redacted]d reporting vaginal bleeding. Bleeding likely secondary to cervical/vaginal irration from chronic BV and yeast infections that patient has been treated for throughout pregnancy. Blood appears old. Cervical exam reassuring. No signs of preterm labor. [place] last week unremarkable. cHTN in pregnancy complicated by non-compliance. Patient BPs responded to IV labetalol; gave hhome doses of BP meds prior to discharge.    P: Discharge home - received BMZ x1; return to MAU tomorrow for BP recheck and second dose of BMZ - Rx given for flagyl gel (per pt request) and fluconazole - encouraged daily use of blood pressure medications - Reviewed findings and my conclusion - fetal kick counts reinforced - preterm labor precautions dicussed - Handout given on hypertension and bleeding in pregnancy - Follow-up with OB provider next week   Luiz Blare, DO 12/29/2015, 10:16 AM PGY-2, [facility]  OB FELLOW MAU DISCHARGE ATTESTATION  I have seen and examined this patient; I agree with above documentation in the resident's note.    Desma Maxim, MD 3:10 PM

## 2015-12-29 NOTE — Progress Notes (Signed)
Called Dr. Gerarda Fraction to confirm what IV fluids she wanted to give with the IV medications she ordered.  Provider states she may just give her home dose of medications instead of the medications ordered.  Provider states she is going to talk to Dr. Si Raider and come see her so wait to start the IV and give IV medications until she comes to see her.

## 2015-12-29 NOTE — MAU Note (Signed)
Pt states she had bright red bleeding.  She noticed it when she woke up this morning.  There was bleeding on the sheet and in the toilet.  Pt states the bleeding on the sheets was the size of a quarter.  Pt denies passing any blood clots.  Pt states she was feel the baby move earlier but states it has been decreased since she woke up.

## 2015-12-29 NOTE — Progress Notes (Signed)
Dr. Gerarda Fraction states she is going to wait on the pt's second wet prep results but to go ahead and give her home blood pressure medications.  Provider states to have pt resume her home blood pressure medications tonight.  Provider states to hold the next IV dose of labetalol.  Provider states she will begin preparing for discharge as she waits for results.

## 2015-12-29 NOTE — Discharge Instructions (Signed)
Follow-up at next [facility] appointment next week Come into MAU tomorrow morning for blood pressure recheck and second dose of steroids!! TAKE BLOOD PRESSURE MEDICATIONS DAILY Believe vaginal bleeding from infection irritating you.  Take one dose of fluconazole prior to starting BV treatment. Then take the 3 additional pills after.   Hypertension During Pregnancy Hypertension, or high blood pressure, is when there is extra pressure inside your blood vessels that carry blood from the heart to the rest of your body (arteries). It can happen at any time in life, including pregnancy. Hypertension during pregnancy can cause problems for you and your baby. Your baby might not weigh as much as he or she should at birth or might be born early (premature). Very bad cases of hypertension during pregnancy can be life-threatening.  Different types of hypertension can occur during pregnancy. These include:  Chronic hypertension. This happens when a woman has hypertension before pregnancy and it continues during pregnancy.  Gestational hypertension. This is when hypertension develops during pregnancy.  Preeclampsia or toxemia of pregnancy. This is a very serious type of hypertension that develops only during pregnancy. It affects the whole body and can be very dangerous for both mother and baby.  Gestational hypertension and preeclampsia usually go away after your baby is born. Your blood pressure will likely stabilize within 6 weeks. Women who have hypertension during pregnancy have a greater chance of developing hypertension later in life or with future pregnancies. RISK FACTORS There are certain factors that make it more likely for you to develop hypertension during pregnancy. These include:  Having hypertension before pregnancy.  Having hypertension during a previous pregnancy.  Being overweight.  Being older than 40 years.  Being pregnant with more than one baby.  Having diabetes or kidney  problems. SIGNS AND SYMPTOMS Chronic and gestational hypertension rarely cause symptoms. Preeclampsia has symptoms, which may include:  Increased protein in your urine. Your health care provider will check for this at every prenatal visit.  Swelling of your hands and face.  Rapid weight gain.  Headaches.  Visual changes.  Being bothered by light.  Abdominal pain, especially in the upper right area.  Chest pain.  Shortness of breath.  Increased reflexes.  Seizures. These occur with a more severe form of preeclampsia, called eclampsia. DIAGNOSIS  You may be diagnosed with hypertension during a regular prenatal exam. At each prenatal visit, you may have:  Your blood pressure checked.  A urine test to check for protein in your urine. The type of hypertension you are diagnosed with depends on when you developed it. It also depends on your specific blood pressure reading.  Developing hypertension before 20 weeks of pregnancy is consistent with chronic hypertension.  Developing hypertension after 20 weeks of pregnancy is consistent with gestational hypertension.  Hypertension with increased urinary protein is diagnosed as preeclampsia.  Blood pressure measurements that stay above 0000000 systolic or [id number] diastolic are a sign of severe preeclampsia. TREATMENT Treatment for hypertension during pregnancy varies. Treatment depends on the type of hypertension and how serious it is.  If you take medicine for chronic hypertension, you may need to switch medicines.  Medicines called ACE inhibitors should not be taken during pregnancy.  Low-dose aspirin may be suggested for women who have risk factors for preeclampsia.  If you have gestational hypertension, you may need to take a blood pressure medicine that is safe during pregnancy. Your health care provider will recommend the correct medicine.  If you have severe preeclampsia, you may  need to be in the hospital. Health care providers  will watch you and your baby very closely. You also may need to take medicine called magnesium sulfate to prevent seizures and lower blood pressure.  Sometimes, an early delivery is needed. This may be the case if the condition worsens. It would be done to protect you and your baby. The only cure for preeclampsia is delivery.  Your health care provider may recommend that you take one low-dose aspirin (81 mg) each day to help prevent high blood pressure during your pregnancy if you are at risk for preeclampsia. You may be at risk for preeclampsia if:  You had preeclampsia or eclampsia during a previous pregnancy.  Your baby did not grow as expected during a previous pregnancy.  You experienced preterm birth with a previous pregnancy.  You experienced a separation of the placenta from the uterus (placental abruption) during a previous pregnancy.  You experienced the loss of your baby during a previous pregnancy.  You are pregnant with more than one baby.  You have other medical conditions, such as diabetes or an autoimmune disease. HOME CARE INSTRUCTIONS  Schedule and keep all of your regular prenatal care appointments. This is important.  Take medicines only as directed by your health care provider. Tell your health care provider about all medicines you take.  Eat as little salt as possible.  Get regular exercise.  Do not drink alcohol.  Do not use tobacco products.  Do not drink products with caffeine.  Lie on your left side when resting. SEEK IMMEDIATE MEDICAL CARE IF:  You have severe abdominal pain.  You have sudden swelling in your hands, ankles, or face.  You gain 4 pounds (1.8 kg) or more in 1 week.  You vomit repeatedly.  You have vaginal bleeding.  You do not feel your baby moving as much.  You have a headache.  You have blurred or double vision.  You have muscle twitching or spasms.  You have shortness of breath.  You have blue fingernails or  lips.  You have blood in your urine. MAKE SURE YOU:  Understand these instructions.  Will watch your condition.  Will get help right away if you are not doing well or get worse.   This information is not intended to replace advice given to you by your health care provider. Make sure you discuss any questions you have with your health care provider.   Document Released: 06/17/2011 Document Revised: 10/20/2014 Document Reviewed: 04/28/2013 Elsevier Interactive Patient Education 2016 Elsevier [employer]    Vaginal Bleeding During Pregnancy, Third Trimester  A small amount of bleeding (spotting) from the vagina is common in pregnancy. Sometimes the bleeding is normal and is not a problem, and sometimes it is a sign of something serious. Be sure to tell your doctor about any bleeding from your vagina right away. HOME CARE  Watch your condition for any changes.  Follow your doctor's instructions about how active you can be.  If you are on bed rest:  You may need to stay in bed and only get up to use the bathroom.  You may be allowed to do some activities.  If you need help, make plans for someone to help you.  Write down:  The number of pads you use each day.  How often you change pads.  How soaked (saturated) your pads are.  Do not use tampons.  Do not douche.  Do not have sex or orgasms until your doctor says  it is okay.  Follow your doctor's advice about lifting, driving, and doing physical activities.  If you pass any tissue from your vagina, save the tissue so you can show it to your doctor.  Only take medicines as told by your doctor.  Do not take aspirin because it can make you bleed.  Keep all follow-up visits as told by your doctor. GET HELP IF:   You bleed from your vagina.  You have cramps.  You have labor pains.  You have a fever that does not go away after you take medicine. GET HELP RIGHT AWAY IF:  You have very bad cramps in your back or belly  (abdomen).  You have chills.  You have a gush of fluid from your vagina.  You pass large clots or tissue from your vagina.  You bleed more.  You feel light-headed or weak.  You pass out (faint).  You do not feel your baby move around as much as before. MAKE SURE YOU:  Understand these instructions.  Will watch your condition.  Will get help right away if you are not doing well or get worse.   This information is not intended to replace advice given to you by your health care provider. Make sure you discuss any questions you have with your health care provider.   Document Released: 02/13/2014 Document Reviewed: 02/13/2014 Elsevier Interactive Patient Education [employer].

## 2015-12-29 NOTE — Progress Notes (Signed)
Dr. Si Raider states to recollect wet prep off of speculum and swab because wet prep showed no signs of yeast.  Provider states to hold next dose of labetalol because she is just under the parameters and give it if she goes back up.

## 2015-12-30 ENCOUNTER — Inpatient Hospital Stay (HOSPITAL_COMMUNITY)
Admission: AD | Admit: 2015-12-30 | Discharge: 2015-12-30 | Disposition: A | Discharge status: Home / Self Care | Payer: BLUE CROSS/BLUE SHIELD | Source: Ambulatory Visit | Attending: Obstetrics and Gynecology | Admitting: Obstetrics and Gynecology

## 2015-12-30 DIAGNOSIS — O99213 Obesity complicating pregnancy, third trimester: Secondary | ICD-10-CM

## 2015-12-30 DIAGNOSIS — O4693 Antepartum hemorrhage, unspecified, third trimester: Secondary | ICD-10-CM | POA: Diagnosis present

## 2015-12-30 DIAGNOSIS — O0993 Supervision of high risk pregnancy, unspecified, third trimester: Secondary | ICD-10-CM

## 2015-12-30 DIAGNOSIS — O10913 Unspecified pre-existing hypertension complicating pregnancy, third trimester: Secondary | ICD-10-CM

## 2015-12-30 DIAGNOSIS — O210 Mild hyperemesis gravidarum: Secondary | ICD-10-CM

## 2015-12-30 DIAGNOSIS — Z3A31 31 weeks gestation of pregnancy: Secondary | ICD-10-CM | POA: Insufficient documentation

## 2015-12-30 MED ORDER — BETAMETHASONE SOD PHOS & ACET 6 (3-3) MG/ML IJ SUSP
12.0000 mg | Freq: Once | INTRAMUSCULAR | Status: AC
  Administered 2015-12-30: 12 mg via INTRAMUSCULAR
  Filled 2015-12-30: qty 2

## 2015-12-30 NOTE — MAU Note (Signed)
Doing well, feeling better than yesterday.  Denies HA, visual changes or epigastric pain.  Still having small amt of bleeding- is brown now

## 2015-12-31 LAB — GC/CHLAMYDIA PROBE AMP (~~LOC~~) NOT AT ARMC
CHLAMYDIA, DNA PROBE: NEGATIVE
NEISSERIA GONORRHEA: NEGATIVE

## 2016-01-01 ENCOUNTER — Encounter: Payer: BLUE CROSS/BLUE SHIELD | Admitting: Certified Nurse Midwife

## 2016-01-01 LAB — CULTURE, OB URINE

## 2016-01-03 ENCOUNTER — Encounter (HOSPITAL_COMMUNITY): Payer: Self-pay

## 2016-01-03 ENCOUNTER — Ambulatory Visit (HOSPITAL_COMMUNITY)
Admission: RE | Admit: 2016-01-03 | Discharge: 2016-01-03 | Disposition: A | Discharge status: Home / Self Care | Payer: BLUE CROSS/BLUE SHIELD | Source: Ambulatory Visit | Attending: Obstetrics & Gynecology | Admitting: Obstetrics & Gynecology

## 2016-01-03 ENCOUNTER — Other Ambulatory Visit (HOSPITAL_COMMUNITY): Payer: Self-pay | Admitting: Obstetrics and Gynecology

## 2016-01-03 ENCOUNTER — Encounter: Payer: BLUE CROSS/BLUE SHIELD | Admitting: Obstetrics & Gynecology

## 2016-01-03 DIAGNOSIS — O99213 Obesity complicating pregnancy, third trimester: Secondary | ICD-10-CM | POA: Diagnosis not present

## 2016-01-03 DIAGNOSIS — Z3A31 31 weeks gestation of pregnancy: Secondary | ICD-10-CM

## 2016-01-03 DIAGNOSIS — O289 Unspecified abnormal findings on antenatal screening of mother: Secondary | ICD-10-CM

## 2016-01-03 DIAGNOSIS — O10013 Pre-existing essential hypertension complicating pregnancy, third trimester: Secondary | ICD-10-CM | POA: Diagnosis not present

## 2016-01-03 DIAGNOSIS — O169 Unspecified maternal hypertension, unspecified trimester: Secondary | ICD-10-CM

## 2016-01-03 DIAGNOSIS — O283 Abnormal ultrasonic finding on antenatal screening of mother: Secondary | ICD-10-CM | POA: Insufficient documentation

## 2016-01-10 ENCOUNTER — Ambulatory Visit (INDEPENDENT_AMBULATORY_CARE_PROVIDER_SITE_OTHER): Payer: BLUE CROSS/BLUE SHIELD | Admitting: Obstetrics and Gynecology

## 2016-01-10 ENCOUNTER — Encounter (HOSPITAL_COMMUNITY): Payer: Self-pay

## 2016-01-10 ENCOUNTER — Ambulatory Visit (HOSPITAL_COMMUNITY)
Admission: RE | Admit: 2016-01-10 | Discharge: 2016-01-10 | Disposition: A | Discharge status: Home / Self Care | Payer: Medicaid Other | Source: Ambulatory Visit | Attending: Obstetrics & Gynecology | Admitting: Obstetrics & Gynecology

## 2016-01-10 ENCOUNTER — Inpatient Hospital Stay (HOSPITAL_COMMUNITY)
Admission: AD | Admit: 2016-01-10 | Discharge: 2016-01-10 | Disposition: A | Discharge status: Home / Self Care | Payer: Medicaid Other | Source: Ambulatory Visit | Attending: Obstetrics & Gynecology | Admitting: Obstetrics & Gynecology

## 2016-01-10 ENCOUNTER — Encounter (HOSPITAL_COMMUNITY): Payer: Self-pay | Admitting: *Deleted

## 2016-01-10 VITALS — BP 146/83 | HR 91 | Wt 250.4 lb

## 2016-01-10 DIAGNOSIS — Z8249 Family history of ischemic heart disease and other diseases of the circulatory system: Secondary | ICD-10-CM | POA: Diagnosis not present

## 2016-01-10 DIAGNOSIS — E669 Obesity, unspecified: Secondary | ICD-10-CM | POA: Diagnosis not present

## 2016-01-10 DIAGNOSIS — O289 Unspecified abnormal findings on antenatal screening of mother: Secondary | ICD-10-CM | POA: Diagnosis not present

## 2016-01-10 DIAGNOSIS — O10913 Unspecified pre-existing hypertension complicating pregnancy, third trimester: Secondary | ICD-10-CM

## 2016-01-10 DIAGNOSIS — Z3A32 32 weeks gestation of pregnancy: Secondary | ICD-10-CM

## 2016-01-10 DIAGNOSIS — Z7982 Long term (current) use of aspirin: Secondary | ICD-10-CM | POA: Diagnosis not present

## 2016-01-10 DIAGNOSIS — O10013 Pre-existing essential hypertension complicating pregnancy, third trimester: Secondary | ICD-10-CM | POA: Insufficient documentation

## 2016-01-10 DIAGNOSIS — O169 Unspecified maternal hypertension, unspecified trimester: Secondary | ICD-10-CM

## 2016-01-10 DIAGNOSIS — O283 Abnormal ultrasonic finding on antenatal screening of mother: Secondary | ICD-10-CM

## 2016-01-10 DIAGNOSIS — O10919 Unspecified pre-existing hypertension complicating pregnancy, unspecified trimester: Secondary | ICD-10-CM | POA: Diagnosis not present

## 2016-01-10 DIAGNOSIS — O99213 Obesity complicating pregnancy, third trimester: Secondary | ICD-10-CM

## 2016-01-10 DIAGNOSIS — O368390 Maternal care for abnormalities of the fetal heart rate or rhythm, unspecified trimester, not applicable or unspecified: Secondary | ICD-10-CM

## 2016-01-10 DIAGNOSIS — O210 Mild hyperemesis gravidarum: Secondary | ICD-10-CM | POA: Insufficient documentation

## 2016-01-10 DIAGNOSIS — O0990 Supervision of high risk pregnancy, unspecified, unspecified trimester: Secondary | ICD-10-CM

## 2016-01-10 DIAGNOSIS — O280 Abnormal hematological finding on antenatal screening of mother: Secondary | ICD-10-CM

## 2016-01-10 LAB — POCT URINALYSIS DIP (DEVICE)
Glucose, UA: NEGATIVE mg/dL
Hgb urine dipstick: NEGATIVE
Nitrite: NEGATIVE
Protein, ur: 100 mg/dL — AB
Specific Gravity, Urine: >=1.03 (ref 1.005–1.030)
Urobilinogen, UA: 1 mg/dL (ref 0.0–1.0)
pH: 6.5 (ref 5.0–8.0)

## 2016-01-10 MED ORDER — LABETALOL HCL 300 MG PO TABS
600.0000 mg | ORAL_TABLET | Freq: Once | ORAL | Status: AC
  Administered 2016-01-10: 600 mg via ORAL
  Filled 2016-01-10: qty 2

## 2016-01-10 NOTE — Progress Notes (Signed)
NST reactive but with one prolonged decel. Discussed w/ Dr. Elly Modena, thinks prolonged monitoring in MAU for 2 hours appropriate. Will send now.

## 2016-01-10 NOTE — Discharge Instructions (Signed)
Fetal Movement Counts  Patient Name: __________________________________________________ Patient Due Date: ____________________  Performing a fetal movement count is highly recommended in high-risk pregnancies, but it is good for every pregnant woman to do. Your health care provider may ask you to start counting fetal movements at 28 weeks of the pregnancy. Fetal movements often increase:  · After eating a full meal.  · After physical activity.  · After eating or drinking something sweet or cold.  · At rest.  Pay attention to when you feel the baby is most active. This will help you notice a pattern of your baby's sleep and wake cycles and what factors contribute to an increase in fetal movement. It is important to perform a fetal movement count at the same time each day when your baby is normally most active.   HOW TO COUNT FETAL MOVEMENTS  1. Find a quiet and comfortable area to sit or lie down on your left side. Lying on your left side provides the best blood and oxygen circulation to your baby.  2. Write down the day and time on a sheet of paper or in a journal.  3. Start counting kicks, flutters, swishes, rolls, or jabs in a 2-hour period. You should feel at least 10 movements within 2 hours.  4. If you do not feel 10 movements in 2 hours, wait 2-3 hours and count again. Look for a change in the pattern or not enough counts in 2 hours.  SEEK MEDICAL CARE IF:  · You feel less than 10 counts in 2 hours, tried twice.  · There is no movement in over an hour.  · The pattern is changing or taking longer each day to reach 10 counts in 2 hours.  · You feel the baby is not moving as he or she usually does.  Date: ____________ Movements: ____________ Start time: ____________ Finish time: ____________   Date: ____________ Movements: ____________ Start time: ____________ Finish time: ____________  Date: ____________ Movements: ____________ Start time: ____________ Finish time: ____________  Date: ____________ Movements:  ____________ Start time: ____________ Finish time: ____________  Date: ____________ Movements: ____________ Start time: ____________ Finish time: ____________  Date: ____________ Movements: ____________ Start time: ____________ Finish time: ____________  Date: ____________ Movements: ____________ Start time: ____________ Finish time: ____________  Date: ____________ Movements: ____________ Start time: ____________ Finish time: ____________   Date: ____________ Movements: ____________ Start time: ____________ Finish time: ____________  Date: ____________ Movements: ____________ Start time: ____________ Finish time: ____________  Date: ____________ Movements: ____________ Start time: ____________ Finish time: ____________  Date: ____________ Movements: ____________ Start time: ____________ Finish time: ____________  Date: ____________ Movements: ____________ Start time: ____________ Finish time: ____________  Date: ____________ Movements: ____________ Start time: ____________ Finish time: ____________  Date: ____________ Movements: ____________ Start time: ____________ Finish time: ____________   Date: ____________ Movements: ____________ Start time: ____________ Finish time: ____________  Date: ____________ Movements: ____________ Start time: ____________ Finish time: ____________  Date: ____________ Movements: ____________ Start time: ____________ Finish time: ____________  Date: ____________ Movements: ____________ Start time: ____________ Finish time: ____________  Date: ____________ Movements: ____________ Start time: ____________ Finish time: ____________  Date: ____________ Movements: ____________ Start time: ____________ Finish time: ____________  Date: ____________ Movements: ____________ Start time: ____________ Finish time: ____________   Date: ____________ Movements: ____________ Start time: ____________ Finish time: ____________  Date: ____________ Movements: ____________ Start time: ____________ Finish  time: ____________  Date: ____________ Movements: ____________ Start time: ____________ Finish time: ____________  Date: ____________ Movements: ____________ Start time:   ____________ Finish time: ____________  Date: ____________ Movements: ____________ Start time: ____________ Finish time: ____________  Date: ____________ Movements: ____________ Start time: ____________ Finish time: ____________  Date: ____________ Movements: ____________ Start time: ____________ Finish time: ____________   Date: ____________ Movements: ____________ Start time: ____________ Finish time: ____________  Date: ____________ Movements: ____________ Start time: ____________ Finish time: ____________  Date: ____________ Movements: ____________ Start time: ____________ Finish time: ____________  Date: ____________ Movements: ____________ Start time: ____________ Finish time: ____________  Date: ____________ Movements: ____________ Start time: ____________ Finish time: ____________  Date: ____________ Movements: ____________ Start time: ____________ Finish time: ____________  Date: ____________ Movements: ____________ Start time: ____________ Finish time: ____________   Date: ____________ Movements: ____________ Start time: ____________ Finish time: ____________  Date: ____________ Movements: ____________ Start time: ____________ Finish time: ____________  Date: ____________ Movements: ____________ Start time: ____________ Finish time: ____________  Date: ____________ Movements: ____________ Start time: ____________ Finish time: ____________  Date: ____________ Movements: ____________ Start time: ____________ Finish time: ____________  Date: ____________ Movements: ____________ Start time: ____________ Finish time: ____________  Date: ____________ Movements: ____________ Start time: ____________ Finish time: ____________   Date: ____________ Movements: ____________ Start time: ____________ Finish time: ____________  Date: ____________  Movements: ____________ Start time: ____________ Finish time: ____________  Date: ____________ Movements: ____________ Start time: ____________ Finish time: ____________  Date: ____________ Movements: ____________ Start time: ____________ Finish time: ____________  Date: ____________ Movements: ____________ Start time: ____________ Finish time: ____________  Date: ____________ Movements: ____________ Start time: ____________ Finish time: ____________  Date: ____________ Movements: ____________ Start time: ____________ Finish time: ____________   Date: ____________ Movements: ____________ Start time: ____________ Finish time: ____________  Date: ____________ Movements: ____________ Start time: ____________ Finish time: ____________  Date: ____________ Movements: ____________ Start time: ____________ Finish time: ____________  Date: ____________ Movements: ____________ Start time: ____________ Finish time: ____________  Date: ____________ Movements: ____________ Start time: ____________ Finish time: ____________  Date: ____________ Movements: ____________ Start time: ____________ Finish time: ____________     This information is not intended to replace advice given to you by your health care provider. Make sure you discuss any questions you have with your health care provider.     Document Released: 10/29/2006 Document Revised: 10/20/2014 Document Reviewed: 07/26/2012  Elsevier Interactive Patient Education ©2016 [employer]

## 2016-01-10 NOTE — Progress Notes (Signed)
Subjective:  [name] [name] is a 30 y.o. G1P0 at [redacted]w[redacted]d being seen today for ongoing prenatal care.  She is currently monitored for the following issues for this high-risk pregnancy and has Supervision of high-risk pregnancy; HTN in pregnancy, chronic; HTN (hypertension); Obesity affecting pregnancy, antepartum; Obesity; Abnormal quad screen; and Hyperemesis gravidarum on her problem list.  Patient reports back pain last week, now resolved.  Contractions: Not present. Vag. Bleeding: None.  Movement: Present. Denies leaking of fluid.   The following portions of the patient's history were reviewed and updated as appropriate: allergies, current medications, past family history, past medical history, past social history, past surgical history and problem list. Problem list updated.  Objective:   Filed Vitals:   01/10/16 0954  BP: 146/83  Pulse: 91  Weight: 250 lb 6.4 oz (113.581 kg)    Fetal Status: Fetal Heart Rate (bpm): 140   Movement: Present     General:  Alert, oriented and cooperative. Patient is in no acute distress.  Skin: Skin is warm and dry. No rash noted.   Cardiovascular: Normal heart rate noted  Respiratory: Normal respiratory effort, no problems with respiration noted  Abdomen: Soft, gravid, appropriate for gestational age. Pain/Pressure: Absent     Pelvic: Vag. Bleeding: None     Cervical exam deferred        Extremities: Normal range of motion.  Edema: None  Mental Status: Normal mood and affect. Normal behavior. Normal judgment and thought content.   Urinalysis:      Assessment and Plan:  Pregnancy: G1P0 at [redacted]w[redacted]d  1. HTN in pregnancy, chronic, third trimester - start antenatal surveillance today - u/s for growth scheduled 4/6 - bp appropriate today on meds (wasn't taking at recent mau visit) - 24 hour urine explained and ordere - preE symptoms reviewed, none present currently  Preterm labor symptoms and general obstetric precautions including but not limited to  vaginal bleeding, contractions, leaking of fluid and fetal movement were reviewed in detail with the patient. Please refer to After Visit Summary for other counseling recommendations.   Gwynne Edinger, MD

## 2016-01-10 NOTE — Addendum Note (Signed)
Addended by: Langston Reusing on: 01/10/2016 11:15 AM   Modules accepted: Orders

## 2016-01-10 NOTE — MAU Provider Note (Signed)
Chief Complaint:  Non-stress Test   First Provider Initiated Contact with Patient 01/10/16 1325     HPI: [name] [name] is a 30 y.o. G1P0 at [redacted]w[redacted]d who was sent to maternity admissions for prolonged monitoring after 4 minute decel on NST at [facility] today. She started antenatal testing today for [facility].   Modifying factors: resolved spontanesouly Associated signs and symptoms: Neg for LOF, VB, abd pain, headache, vision changes or epigastric pain.   Good fetal movement.   Patient Active Problem List   Diagnosis Date Noted  . Hyperemesis gravidarum 10/24/2015  . Abnormal quad screen 10/18/2015  . Supervision of high-risk pregnancy 09/24/2015  . HTN in pregnancy, chronic 09/24/2015  . HTN (hypertension) 09/24/2015  . Obesity affecting pregnancy, antepartum 09/24/2015  . Obesity 09/24/2015    Past Medical History: Past Medical History  Diagnosis Date  . Hypertension   . Obesity in pregnancy     Antepartum, first trimester  . URI (upper respiratory infection)   . Condyloma acuminata     Past obstetric history: OB History  Gravida Para Term Preterm AB SAB TAB Ectopic Multiple Living  1             # Outcome Date GA Lbr Len/2nd Weight Sex Delivery Anes PTL Lv  1 Current               Past Surgical History: Past Surgical History  Procedure Laterality Date  . No past surgeries       Family History: Family History  Problem Relation Age of Onset  . Hypertension Mother   . Heart disease Mother     Social History: Social History  Substance Use Topics  . Smoking status: Never Smoker   . Smokeless tobacco: Never Used  . Alcohol Use: No    Allergies: No Known Allergies  Meds:  Prescriptions prior to admission  Medication Sig Dispense Refill Last Dose  . aspirin EC 81 MG tablet Take 1 tablet (81 mg total) by mouth daily. 90 tablet 3 Taking  . fluconazole (DIFLUCAN) 150 MG tablet Take 1 tablet (150 mg total) by mouth daily. 4 tablet 0 Unknown  . labetalol (NORMODYNE) 200  MG tablet Take 3 tablets (600 mg total) by mouth 3 (three) times daily. 270 tablet 3 Taking  . methyldopa (ALDOMET) 250 MG tablet Take 1 tablet (250 mg total) by mouth 2 (two) times daily. 60 tablet 6 Taking  . triamcinolone cream (KENALOG) 0.1 % Apply 1 application topically 2 (two) times daily. 30 g 0 Taking    I have reviewed patient's Past Medical Hx, Surgical Hx, Family Hx, Social Hx, medications and allergies.   ROS:  Review of Systems  Eyes: Negative for visual disturbance.  Gastrointestinal: Negative for abdominal pain.  Genitourinary: Negative for vaginal bleeding.       Neg for LOF.   Neurological: Negative for headaches.    Physical Exam  Patient Vitals for the past 24 hrs:  BP Pulse Resp SpO2 Height Weight  01/10/16 1311 158/90 mmHg 86 18 100 % 5\' 3"  (1.6 m) 250 lb 9.6 oz (113.671 kg)   Constitutional: Well-developed, well-nourished female in no acute distress.  Cardiovascular: normal rate Respiratory: normal effort GI: Abd soft, non-tender, gravid appropriate for gestational age. MS: Extremities nontender, Tr edema, normal ROM Neurologic: Alert and oriented x 4. DTRs 2+. No Clonus.  GU: Deferred    FHT:  Baseline 125 , moderate variability, accelerations present, no decelerations Contractions: None   Labs: Results for orders placed  or performed in visit on 01/10/16 (from the past 24 hour(s))  POCT urinalysis dip (device)     Status: Abnormal   Collection Time: 01/10/16 10:04 AM  Result Value Ref Range   Glucose, UA NEGATIVE NEGATIVE mg/dL   Bilirubin Urine SMALL (A) NEGATIVE   Ketones, ur TRACE (A) NEGATIVE mg/dL   Specific Gravity, Urine >=1.030 1.005 - 1.030   Hgb urine dipstick NEGATIVE NEGATIVE   pH 6.5 5.0 - 8.0   Protein, ur 100 (A) NEGATIVE mg/dL   Urobilinogen, UA 1.0 0.0 - 1.0 mg/dL   Nitrite NEGATIVE NEGATIVE   Leukocytes, UA SMALL (A) NEGATIVE    Imaging:  AFI 12.12.   MAU Course: Fetal monitoring  X 2 hours.  BP 160/102 at end of  monitoring. Has not had mid-day Labetalol dose. No Pre-E Sx. Discussed Hx, exam, BPs, tracing w/ Dr. Harolyn Rutherford. Will give PO Labetalol and recheck BP. Pre-E labs do not need to be repeated at this time. Pt has already been instructed to collect 24 hour urine.   BP recheck 158/102. D/W Dr. Harolyn Rutherford. OK for D/C.   MDM: - 30 year old female at 84 weeks 6 day gestation with reactive fetal heart rate tracing 2 hours and no further decelerations. Fetal status overall reassuring. -  CHTN w/ one severe-range BP after missing med (due to being sent to MAU for prolonged monitoring.) No evidence of superimposed Pre-E.   Assessment: 1. Fetal heart rate decelerations affecting management of mother   2. HTN in pregnancy, chronic, third trimester    Plan: Discharge home in stable condition.  Pre-E and PTL precautions and fetal kick counts Take meds on schedule.  Collect 24-hour urine as instructed.  Follow-up Information    Follow up with [facility] On 01/14/2016.   Specialty:  Obstetrics and Gynecology   Why:  Routine prenatal visit   Contact information:   [place] [phone]      Follow up with [facility].   Why:  As needed in emergencies   Contact information:   [address] [id number] [place] [phone]        Medication List    TAKE these medications        aspirin EC 81 MG tablet  Take 1 tablet (81 mg total) by mouth daily.     [facility] PRENATAL GUMMY PO  Take 2 each by mouth daily.     fluconazole 150 MG tablet  Commonly known as:  DIFLUCAN  Take 1 tablet (150 mg total) by mouth daily.     labetalol 200 MG tablet  Commonly known as:  NORMODYNE  Take 3 tablets (600 mg total) by mouth 3 (three) times daily.     methyldopa 250 MG tablet  Commonly known as:  ALDOMET  Take 1 tablet (250 mg total) by mouth 2 (two) times daily.      triamcinolone cream 0.1 %  Commonly known as:  KENALOG  Apply 1 application topically 2 (two) times daily.       [place], CNM 01/10/2016 1:26 PM

## 2016-01-10 NOTE — Assessment & Plan Note (Signed)
Declines further testing

## 2016-01-10 NOTE — MAU Note (Signed)
Pt had a decel during  NST in clinic sent for prolonged monitoring. Pt denies pain or cramping  Good fetal movement reported.

## 2016-01-10 NOTE — Addendum Note (Signed)
Addended by: Langston Reusing on: 01/10/2016 12:53 PM   Modules accepted: Orders

## 2016-01-14 ENCOUNTER — Ambulatory Visit (INDEPENDENT_AMBULATORY_CARE_PROVIDER_SITE_OTHER): Payer: BLUE CROSS/BLUE SHIELD | Admitting: Obstetrics & Gynecology

## 2016-01-14 VITALS — BP 93/54 | HR 98 | Wt 249.9 lb

## 2016-01-14 DIAGNOSIS — O280 Abnormal hematological finding on antenatal screening of mother: Secondary | ICD-10-CM

## 2016-01-14 DIAGNOSIS — R42 Dizziness and giddiness: Secondary | ICD-10-CM

## 2016-01-14 DIAGNOSIS — O10913 Unspecified pre-existing hypertension complicating pregnancy, third trimester: Secondary | ICD-10-CM

## 2016-01-14 DIAGNOSIS — O289 Unspecified abnormal findings on antenatal screening of mother: Secondary | ICD-10-CM

## 2016-01-14 DIAGNOSIS — O0993 Supervision of high risk pregnancy, unspecified, third trimester: Secondary | ICD-10-CM

## 2016-01-14 LAB — COMPREHENSIVE METABOLIC PANEL
ALBUMIN: 3 g/dL — AB (ref 3.6–5.1)
ALK PHOS: 76 U/L (ref 33–115)
ALT: 9 U/L (ref 6–29)
AST: 10 U/L (ref 10–30)
BILIRUBIN TOTAL: 0.3 mg/dL (ref 0.2–1.2)
BUN: 10 mg/dL (ref 7–25)
CALCIUM: 8.7 mg/dL (ref 8.6–10.2)
CO2: 22 mmol/L (ref 20–31)
CREATININE: 0.69 mg/dL (ref 0.50–1.10)
Chloride: 105 mmol/L (ref 98–110)
Glucose, Bld: 95 mg/dL (ref 65–99)
Potassium: 3.9 mmol/L (ref 3.5–5.3)
Sodium: 136 mmol/L (ref 135–146)
TOTAL PROTEIN: 5.3 g/dL — AB (ref 6.1–8.1)

## 2016-01-14 NOTE — Progress Notes (Signed)
Patient in lab c/o dizziness

## 2016-01-14 NOTE — Progress Notes (Signed)
NST reactive.

## 2016-01-14 NOTE — Progress Notes (Signed)
[name] [name] was here today for labs and NST she c/o feeling dizzy. Her BP was lower than usual at 89/51 and 93/54.  [name] [name] reports that she has complained of feeling dizzy for several weeks.  Review of her chart reveals chronic HTN. She reports that she was prev noncompliant with meds and now has been compliant.  She is on Aldomet 250mg  bid and Labetalol 200mg  3 po tid.  I have asked her to decrease this to 200 tid and cont the Aldomet and f/u in 4 days for repeat BP check. She was encouraged to stay complaint with her meds. [name] [name] reports feeling better before she left the ofc.  NST reviewed and reactive.  Talib Headley L. Harraway-Smith, M.D., Cherlynn June

## 2016-01-16 LAB — CREATININE CLEARANCE, URINE, 24 HOUR
CREATININE: 0.69 mg/dL (ref 0.50–1.10)
Creatinine Clearance: 190 mL/min — ABNORMAL HIGH (ref 75–115)
Creatinine, 24H Ur: 1.89 g/(24.h) (ref 0.63–2.50)
Creatinine, Urine: 154 mg/dL (ref 20–320)

## 2016-01-16 LAB — PROTEIN, URINE, 24 HOUR
Protein, 24H Urine: 208 mg/24 h — ABNORMAL HIGH (ref <150)
Protein, Urine: 17 mg/dL (ref 5–24)

## 2016-01-17 ENCOUNTER — Encounter (HOSPITAL_COMMUNITY): Payer: Self-pay

## 2016-01-17 ENCOUNTER — Ambulatory Visit (HOSPITAL_COMMUNITY)
Admission: RE | Admit: 2016-01-17 | Discharge: 2016-01-17 | Disposition: A | Discharge status: Home / Self Care | Payer: BLUE CROSS/BLUE SHIELD | Source: Ambulatory Visit | Attending: Obstetrics & Gynecology | Admitting: Obstetrics & Gynecology

## 2016-01-17 ENCOUNTER — Other Ambulatory Visit (HOSPITAL_COMMUNITY): Payer: Self-pay | Admitting: Obstetrics and Gynecology

## 2016-01-17 ENCOUNTER — Ambulatory Visit (INDEPENDENT_AMBULATORY_CARE_PROVIDER_SITE_OTHER): Payer: BLUE CROSS/BLUE SHIELD | Admitting: Family

## 2016-01-17 VITALS — BP 123/76 | HR 97 | Wt 252.4 lb

## 2016-01-17 DIAGNOSIS — O99213 Obesity complicating pregnancy, third trimester: Secondary | ICD-10-CM | POA: Diagnosis not present

## 2016-01-17 DIAGNOSIS — Z3A33 33 weeks gestation of pregnancy: Secondary | ICD-10-CM | POA: Insufficient documentation

## 2016-01-17 DIAGNOSIS — O169 Unspecified maternal hypertension, unspecified trimester: Secondary | ICD-10-CM

## 2016-01-17 DIAGNOSIS — N898 Other specified noninflammatory disorders of vagina: Secondary | ICD-10-CM

## 2016-01-17 DIAGNOSIS — O10913 Unspecified pre-existing hypertension complicating pregnancy, third trimester: Secondary | ICD-10-CM | POA: Diagnosis not present

## 2016-01-17 DIAGNOSIS — O283 Abnormal ultrasonic finding on antenatal screening of mother: Secondary | ICD-10-CM | POA: Insufficient documentation

## 2016-01-17 DIAGNOSIS — O289 Unspecified abnormal findings on antenatal screening of mother: Secondary | ICD-10-CM

## 2016-01-17 DIAGNOSIS — O0993 Supervision of high risk pregnancy, unspecified, third trimester: Secondary | ICD-10-CM

## 2016-01-17 DIAGNOSIS — O10013 Pre-existing essential hypertension complicating pregnancy, third trimester: Secondary | ICD-10-CM | POA: Diagnosis not present

## 2016-01-17 LAB — POCT URINALYSIS DIP (DEVICE)
GLUCOSE, UA: NEGATIVE mg/dL
NITRITE: NEGATIVE
Protein, ur: 100 mg/dL — AB
Specific Gravity, Urine: >=1.03 (ref 1.005–1.030)
UROBILINOGEN UA: 1 mg/dL (ref 0.0–1.0)
pH: 6.5 (ref 5.0–8.0)

## 2016-01-17 MED ORDER — TERCONAZOLE 0.4 % VA CREA: 1.0000 | TOPICAL_CREAM | Freq: Every day | VAGINAL | Status: DC

## 2016-01-17 NOTE — Progress Notes (Signed)
Subjective:  [name] [name] is a 30 y.o. G1P0 at [redacted]w[redacted]d being seen today for ongoing prenatal care.  She is currently monitored for the following issues for this high-risk pregnancy and has Supervision of high-risk pregnancy; HTN in pregnancy, chronic; HTN (hypertension); Obesity affecting pregnancy, antepartum; Obesity; Abnormal quad screen; and Hyperemesis gravidarum on her problem list.  Patient reports continued vaginal itching.  Growth ultrasound earlier today, told growth and fluid normal.  Contractions: Not present. Vag. Bleeding: None.  Movement: Present. Denies leaking of fluid.   The following portions of the patient's history were reviewed and updated as appropriate: allergies, current medications, past family history, past medical history, past social history, past surgical history and problem list. Problem list updated.  Objective:   Filed Vitals:   01/17/16 0946  BP: 123/76  Pulse: 97  Weight: 252 lb 6.4 oz (114.488 kg)    Fetal Status: Fetal Heart Rate (bpm): NST Fundal Height: 32 cm Movement: Present     General:  Alert, oriented and cooperative. Patient is in no acute distress.  Skin: Skin is warm and dry. No rash noted.   Cardiovascular: Normal heart rate noted  Respiratory: Normal respiratory effort, no problems with respiration noted  Abdomen: Soft, gravid, appropriate for gestational age. Pain/Pressure: Present     Pelvic: Vag. Bleeding: None     Cervical exam deferred        Extremities: Normal range of motion.  Edema: None  Mental Status: Normal mood and affect. Normal behavior. Normal judgment and thought content.   Urinalysis:     Protein 1+ Glucose negative  Assessment and Plan:  Pregnancy: G1P0 at [redacted]w[redacted]d  1. HTN in pregnancy, chronic, third trimester - NST-R; reviewed by Dr. Harolyn Rutherford - Continue monitoring  - Keep labetalol at current dose - Twice weekly testing  2. Vaginal itching - terconazole (TERAZOL 7) 0.4 % vaginal cream; Place 1 applicator  vaginally at bedtime.  Dispense: 45 g; Refill: 0  3. Supervision of high-risk pregnancy, third trimester  Preterm labor symptoms and general obstetric precautions including but not limited to vaginal bleeding, contractions, leaking of fluid and fetal movement were reviewed in detail with the patient. Please refer to After Visit Summary for other counseling recommendations.  Return in about 4 days (around 01/21/2016) for 2x/wk as scheduled.   Venia Carbon Michiel Cowboy, CNM

## 2016-01-17 NOTE — Progress Notes (Signed)
[place] for growth done today. Pt states she is feeling much better since reducing the dosage of Labetalol on 4/3. Pt states she is still having vaginal itching and discharge.

## 2016-01-21 ENCOUNTER — Ambulatory Visit (INDEPENDENT_AMBULATORY_CARE_PROVIDER_SITE_OTHER): Payer: BLUE CROSS/BLUE SHIELD | Admitting: *Deleted

## 2016-01-21 VITALS — BP 133/82

## 2016-01-21 DIAGNOSIS — O10913 Unspecified pre-existing hypertension complicating pregnancy, third trimester: Secondary | ICD-10-CM

## 2016-01-21 NOTE — Progress Notes (Signed)
Pt denies H/A or visual disturbances.  

## 2016-01-21 NOTE — Progress Notes (Signed)
NST reactive.

## 2016-01-24 ENCOUNTER — Ambulatory Visit (HOSPITAL_COMMUNITY): Payer: BLUE CROSS/BLUE SHIELD

## 2016-01-24 ENCOUNTER — Ambulatory Visit (INDEPENDENT_AMBULATORY_CARE_PROVIDER_SITE_OTHER): Payer: BLUE CROSS/BLUE SHIELD | Admitting: Family

## 2016-01-24 VITALS — BP 138/82 | HR 93 | Wt 251.5 lb

## 2016-01-24 DIAGNOSIS — O0993 Supervision of high risk pregnancy, unspecified, third trimester: Secondary | ICD-10-CM

## 2016-01-24 DIAGNOSIS — O10913 Unspecified pre-existing hypertension complicating pregnancy, third trimester: Secondary | ICD-10-CM | POA: Diagnosis not present

## 2016-01-24 DIAGNOSIS — Z36 Encounter for antenatal screening of mother: Secondary | ICD-10-CM | POA: Diagnosis not present

## 2016-01-24 LAB — POCT URINALYSIS DIP (DEVICE)
BILIRUBIN URINE: NEGATIVE
Glucose, UA: NEGATIVE mg/dL
Ketones, ur: NEGATIVE mg/dL
Nitrite: NEGATIVE
PH: 7 (ref 5.0–8.0)
PROTEIN: 30 mg/dL — AB
Specific Gravity, Urine: 1.02 (ref 1.005–1.030)
Urobilinogen, UA: 0.2 mg/dL (ref 0.0–1.0)

## 2016-01-24 NOTE — Progress Notes (Signed)
Subjective:  [name] [name] is a 30 y.o. G1P0 at [redacted]w[redacted]d being seen today for ongoing prenatal care.  She is currently monitored for the following issues for this high-risk pregnancy and has Supervision of high-risk pregnancy; HTN in pregnancy, chronic; HTN (hypertension); Obesity affecting pregnancy, antepartum; Obesity; Abnormal quad screen; and Hyperemesis gravidarum on her problem list.  Patient reports no complaints.  Denies headache, vision changes, or epigastric pain.    Contractions: Not present. Vag. Bleeding: None.  Movement: Present. Denies leaking of fluid.   The following portions of the patient's history were reviewed and updated as appropriate: allergies, current medications, past family history, past medical history, past social history, past surgical history and problem list. Problem list updated.  Objective:   Filed Vitals:   01/24/16 0755  BP: 166/101  Pulse: 93  Weight: 251 lb 8 oz (114.08 kg)    Fetal Status: Fetal Heart Rate (bpm): NST-R Fundal Height: 33 cm Movement: Present     General:  Alert, oriented and cooperative. Patient is in no acute distress.  Skin: Skin is warm and dry. No rash noted.   Cardiovascular: Normal heart rate noted  Respiratory: Normal respiratory effort, no problems with respiration noted  Abdomen: Soft, gravid, appropriate for gestational age. Pain/Pressure: Present     Pelvic: Vag. Bleeding: None     Cervical exam deferred        Extremities: Normal range of motion.  Edema: None  Mental Status: Normal mood and affect. Normal behavior. Normal judgment and thought content.   Urinalysis: Urine Protein: 1+ Urine Glucose: Negative  Assessment and Plan:  Pregnancy: G1P0 at [redacted]w[redacted]d  1. HTN in pregnancy, chronic, third trimester - Amniotic fluid index with NST - CBC - COMPLETE METABOLIC PANEL WITH GFR - Protein / creatinine ratio, urine  2. Supervision of high-risk pregnancy, third trimester - Continue close observation - Reviewed signs of  preeclampsia  Preterm labor symptoms and general obstetric precautions including but not limited to vaginal bleeding, contractions, leaking of fluid and fetal movement were reviewed in detail with the patient. Please refer to After Visit Summary for other counseling recommendations.  Return in about 4 days (around 01/28/2016) for 2x/wk as scheduled.   Venia Carbon Michiel Cowboy, CNM

## 2016-01-24 NOTE — Progress Notes (Signed)
Pt states she just took her BP medication less than 1 hour ago. She denies H/A or visual disturbances.

## 2016-01-24 NOTE — Patient Instructions (Signed)
AREA PEDIATRIC/FAMILY PRACTICE PHYSICIANS  [facility] [address] [place], [place] [address] Phone - [phone]   Fax - [phone]  [facility] AM[facility] [address] [place], [place]  [address] Phone - [phone]   Fax - [phone]  [facility] [address] [place], [place]  [address] Phone - [phone]   Fax - [phone]  [facility] [address] [place], [place]  [address] Phone - [phone]   Fax - [phone]  [facility] [address], [address] [place], [place]  [address] Phone - [phone]   Fax - [phone]  [facility] [address], [address] [place], [place]  [address] Phone - [phone]   Fax - [phone]  [facility] [address] [place], [place]  [address] Phone - [phone]   Fax - [phone]  [facility] [address], [address] [place], [place]  [address] Phone - [phone]   Fax - [phone]  [facility] [address] [place], [place]  [address] Phone - [phone]   Fax - [phone] [facility] [address] [place], [place]  [address] Phone - [phone]   Fax - [phone]  [facility] [address] [place], [place]  [address] Phone - [phone]   Fax - [phone]  [facility] [address] [place], [place]  [address] Phone - [phone]   Fax - [phone]  [facility] [address] [place], [place]  [address] Phone - [phone]   Fax - [phone]  SHILPA GOSRANI [address] [place], [place]  [address] Phone - [phone]  [facility] [address] [place], [place]  [address] Phone - [phone]   Fax - [phone]  [facility] [address] [place], [place]  [address] Phone - [phone]   Fax - [phone]  [facility] [address] [place], [place]  [address] Phone - [phone]   Fax - [phone]  [facility] [address] [place], [place]  [address] Phone - [phone]   Fax - [phone]   [facility] [address] [place], [place]  [address] Phone - [phone]   Fax - [phone]  [facility] [address] [place], [place]  [address] Phone - [phone]   Fax - [phone]  DAVID RUBIN [address] [place], [place]  [address] Phone - [phone]   Fax - [phone]  [facility] [address] , [place]  [address] Phone - [phone]   Fax - [phone]  [facility] [address] , [place]  [address] Phone - [phone]   Fax - [phone] [facility] [address] [place], [place]  [address] Phone - [phone]   Fax - [phone]  [facility] [address] [place], [place]  [address] Phone - [phone]   Fax - [phone]  [facility] [address] [place],   [address] Phone - [phone]   Fax - [phone]   

## 2016-01-25 LAB — CBC
HCT: 31.6 % — ABNORMAL LOW (ref 35.0–45.0)
Hemoglobin: 10.4 g/dL — ABNORMAL LOW (ref 11.7–15.5)
MCH: 25.4 pg — AB (ref 27.0–33.0)
MCHC: 32.9 g/dL (ref 32.0–36.0)
MCV: 77.1 fL — AB (ref 80.0–100.0)
MPV: 9.1 fL (ref 7.5–12.5)
PLATELETS: 207 10*3/uL (ref 140–400)
RBC: 4.1 MIL/uL (ref 3.80–5.10)
RDW: 16.2 % — ABNORMAL HIGH (ref 11.0–15.0)
WBC: 7.8 10*3/uL (ref 3.8–10.8)

## 2016-01-25 LAB — COMPREHENSIVE METABOLIC PANEL
ALBUMIN: 3.1 g/dL — AB (ref 3.6–5.1)
ALK PHOS: 80 U/L (ref 33–115)
ALT: 10 U/L (ref 6–29)
AST: 10 U/L (ref 10–30)
BUN: 6 mg/dL — ABNORMAL LOW (ref 7–25)
CO2: 20 mmol/L (ref 20–31)
CREATININE: 0.57 mg/dL (ref 0.50–1.10)
Calcium: 8.5 mg/dL — ABNORMAL LOW (ref 8.6–10.2)
Chloride: 107 mmol/L (ref 98–110)
Glucose, Bld: 92 mg/dL (ref 65–99)
Potassium: 3.8 mmol/L (ref 3.5–5.3)
SODIUM: 137 mmol/L (ref 135–146)
TOTAL PROTEIN: 5.4 g/dL — AB (ref 6.1–8.1)
Total Bilirubin: 0.4 mg/dL (ref 0.2–1.2)

## 2016-01-25 LAB — PROTEIN / CREATININE RATIO, URINE
Creatinine, Urine: 298 mg/dL (ref 20–320)
Protein Creatinine Ratio: 94 mg/g creat (ref 21–161)
Total Protein, Urine: 28 mg/dL — ABNORMAL HIGH (ref 5–24)

## 2016-01-28 ENCOUNTER — Other Ambulatory Visit: Payer: BLUE CROSS/BLUE SHIELD

## 2016-01-31 ENCOUNTER — Ambulatory Visit (INDEPENDENT_AMBULATORY_CARE_PROVIDER_SITE_OTHER): Payer: BLUE CROSS/BLUE SHIELD | Admitting: Obstetrics & Gynecology

## 2016-01-31 ENCOUNTER — Ambulatory Visit (HOSPITAL_COMMUNITY): Payer: BLUE CROSS/BLUE SHIELD

## 2016-01-31 VITALS — BP 123/80 | HR 92 | Wt 253.0 lb

## 2016-01-31 DIAGNOSIS — O10913 Unspecified pre-existing hypertension complicating pregnancy, third trimester: Secondary | ICD-10-CM

## 2016-01-31 DIAGNOSIS — Z36 Encounter for antenatal screening of mother: Secondary | ICD-10-CM | POA: Diagnosis not present

## 2016-01-31 DIAGNOSIS — O0993 Supervision of high risk pregnancy, unspecified, third trimester: Secondary | ICD-10-CM

## 2016-01-31 DIAGNOSIS — Z113 Encounter for screening for infections with a predominantly sexual mode of transmission: Secondary | ICD-10-CM | POA: Diagnosis not present

## 2016-01-31 LAB — POCT URINALYSIS DIP (DEVICE)
Glucose, UA: NEGATIVE mg/dL
Ketones, ur: NEGATIVE mg/dL
Nitrite: NEGATIVE
Protein, ur: 100 mg/dL — AB
SPECIFIC GRAVITY, URINE: 1.025 (ref 1.005–1.030)
UROBILINOGEN UA: 1 mg/dL (ref 0.0–1.0)
pH: 7 (ref 5.0–8.0)

## 2016-01-31 LAB — OB RESULTS CONSOLE GBS: GBS: POSITIVE

## 2016-01-31 NOTE — Progress Notes (Signed)
Subjective:  [name] [name] is a 30 y.o. G1P0 at [redacted]w[redacted]d being seen today for ongoing prenatal care.  She is currently monitored for the following issues for this high-risk pregnancy and has Supervision of high-risk pregnancy; HTN in pregnancy, chronic; HTN (hypertension); Obesity affecting pregnancy, antepartum; Obesity; Abnormal quad screen; and Hyperemesis gravidarum on her problem list.  Patient reports no complaints.  Contractions: Irregular. Vag. Bleeding: None.  Movement: Present. Denies leaking of fluid.   The following portions of the patient's history were reviewed and updated as appropriate: allergies, current medications, past family history, past medical history, past social history, past surgical history and problem list. Problem list updated.  Objective:   Filed Vitals:   01/31/16 0757 01/31/16 0840  BP: 94/58 123/80  Pulse: 92   Weight: 253 lb (114.76 kg)     Fetal Status: Fetal Heart Rate (bpm): NST   Movement: Present  Presentation: Vertex  General:  Alert, oriented and cooperative. Patient is in no acute distress.  Skin: Skin is warm and dry. No rash noted.   Cardiovascular: Normal heart rate noted  Respiratory: Normal respiratory effort, no problems with respiration noted  Abdomen: Soft, gravid, appropriate for gestational age. Pain/Pressure: Present     Pelvic: Vag. Bleeding: None   Cervical exam performed Dilation: Closed Effacement (%): Thick Station: Ballotable Pelvic cultures done today.  Extremities: Normal range of motion.  Edema: None  Mental Status: Normal mood and affect. Normal behavior. Normal judgment and thought content.   Urinalysis: Urine Protein: 2+ Urine Glucose: Negative  NST performed today was reviewed and was found to be reactive.  AFI was also normal.  Continue recommended antenatal testing and prenatal care.  Assessment and Plan:  Pregnancy: G1P0 at [redacted]w[redacted]d  1. HTN in pregnancy, chronic, third trimester Continue serial growth scans with MFM,  antenatal testing.   Continue medication and titrate prn. - Amniotic fluid index with NST - Culture, beta strep (group b only) - GC/Chlamydia probe amp ([place])not at [facility]  2. Supervision of high-risk pregnancy, third trimester Preterm labor symptoms and general obstetric precautions including but not limited to vaginal bleeding, contractions, leaking of fluid and fetal movement were reviewed in detail with the patient. Please refer to After Visit Summary for other counseling recommendations.  Return in about 4 days (around 02/04/2016) for 2x/week testing and OB visits as scheduled.   Osborne Oman, MD

## 2016-01-31 NOTE — Patient Instructions (Signed)
Return to clinic for any scheduled appointments or obstetric concerns, or go to MAU for evaluation  

## 2016-02-01 LAB — GC/CHLAMYDIA PROBE AMP (~~LOC~~) NOT AT ARMC
Chlamydia: NEGATIVE
Neisseria Gonorrhea: NEGATIVE

## 2016-02-02 LAB — CULTURE, BETA STREP (GROUP B ONLY)

## 2016-02-03 ENCOUNTER — Encounter: Payer: Self-pay | Admitting: Obstetrics & Gynecology

## 2016-02-03 DIAGNOSIS — O99820 Streptococcus B carrier state complicating pregnancy: Secondary | ICD-10-CM | POA: Insufficient documentation

## 2016-02-04 ENCOUNTER — Ambulatory Visit (INDEPENDENT_AMBULATORY_CARE_PROVIDER_SITE_OTHER): Payer: BLUE CROSS/BLUE SHIELD | Admitting: *Deleted

## 2016-02-04 VITALS — BP 154/100 | HR 93

## 2016-02-04 DIAGNOSIS — O10913 Unspecified pre-existing hypertension complicating pregnancy, third trimester: Secondary | ICD-10-CM | POA: Diagnosis not present

## 2016-02-04 NOTE — Progress Notes (Signed)
NST reactive.

## 2016-02-04 NOTE — Progress Notes (Signed)
States only took one labetolol twice today instead of two each time. Denies headache or blurry vision or spots.  Called Dr. Nehemiah Settle with BP's today, and assessment as above and that patient reports has only taked one labetolol twice today. Per Dr. Nehemiah Settle if NST reactive, patient is to be sent home and instructed to take her medcine as directed.  Strip reactive. Reviewed with patient to come to MAU for headache unrelieved by tylenol, sudden general edema, visual changes or  Baby not moving well.

## 2016-02-07 ENCOUNTER — Ambulatory Visit (INDEPENDENT_AMBULATORY_CARE_PROVIDER_SITE_OTHER): Payer: Medicaid Other | Admitting: Obstetrics & Gynecology

## 2016-02-07 ENCOUNTER — Ambulatory Visit (HOSPITAL_COMMUNITY): Payer: BLUE CROSS/BLUE SHIELD

## 2016-02-07 VITALS — BP 130/82 | HR 88 | Wt 253.7 lb

## 2016-02-07 DIAGNOSIS — O99213 Obesity complicating pregnancy, third trimester: Secondary | ICD-10-CM | POA: Diagnosis not present

## 2016-02-07 DIAGNOSIS — O10913 Unspecified pre-existing hypertension complicating pregnancy, third trimester: Secondary | ICD-10-CM

## 2016-02-07 DIAGNOSIS — Z36 Encounter for antenatal screening of mother: Secondary | ICD-10-CM | POA: Diagnosis not present

## 2016-02-07 DIAGNOSIS — E669 Obesity, unspecified: Secondary | ICD-10-CM | POA: Diagnosis not present

## 2016-02-07 DIAGNOSIS — O99820 Streptococcus B carrier state complicating pregnancy: Secondary | ICD-10-CM

## 2016-02-07 DIAGNOSIS — R8290 Unspecified abnormal findings in urine: Secondary | ICD-10-CM

## 2016-02-07 DIAGNOSIS — O0993 Supervision of high risk pregnancy, unspecified, third trimester: Secondary | ICD-10-CM

## 2016-02-07 LAB — POCT URINALYSIS DIP (DEVICE)
GLUCOSE, UA: 100 mg/dL — AB
KETONES UR: NEGATIVE mg/dL
NITRITE: NEGATIVE
Protein, ur: 100 mg/dL — AB
Specific Gravity, Urine: >=1.03 (ref 1.005–1.030)
Urobilinogen, UA: 1 mg/dL (ref 0.0–1.0)
pH: 6.5 (ref 5.0–8.0)

## 2016-02-07 NOTE — Progress Notes (Signed)
[place] for growth on 5/4.  Pt denies H/A or visual disturbances

## 2016-02-07 NOTE — Progress Notes (Signed)
IOL scheduled 05/12 @ 630am.

## 2016-02-07 NOTE — Patient Instructions (Signed)
Return to clinic for any scheduled appointments or obstetric concerns, or go to MAU for evaluation  

## 2016-02-07 NOTE — Progress Notes (Signed)
Subjective:  [name] [name] is a 30 y.o. G1P0 at [redacted]w[redacted]d being seen today for ongoing prenatal care.  She is currently monitored for the following issues for this high-risk pregnancy and has Supervision of high-risk pregnancy; HTN in pregnancy, chronic; HTN (hypertension); Obesity affecting pregnancy, antepartum; Obesity; Abnormal quad screen; Hyperemesis gravidarum; and Group B Streptococcus carrier, +RV culture, currently pregnant on her problem list.  Patient reports no complaints.  Contractions: Irregular. Vag. Bleeding: None.  Movement: Present. Denies leaking of fluid.   The following portions of the patient's history were reviewed and updated as appropriate: allergies, current medications, past family history, past medical history, past social history, past surgical history and problem list. Problem list updated.  Objective:   Filed Vitals:   02/07/16 1028  BP: 130/82  Pulse: 88  Weight: 253 lb 11.2 oz (115.078 kg)    Fetal Status: Fetal Heart Rate (bpm): NST   Movement: Present     General:  Alert, oriented and cooperative. Patient is in no acute distress.  Skin: Skin is warm and dry. No rash noted.   Cardiovascular: Normal heart rate noted  Respiratory: Normal respiratory effort, no problems with respiration noted  Abdomen: Soft, gravid, appropriate for gestational age. Pain/Pressure: Present     Pelvic: Vag. Bleeding: None    Cervical exam deferred        Extremities: Normal range of motion.  Edema: None  Mental Status: Normal mood and affect. Normal behavior. Normal judgment and thought content.   Urinalysis: Urine Protein: 2+ Urine Glucose: 1+  NST performed today was reviewed and was found to be reactive.  AFI was also normal.  Continue recommended antenatal testing and prenatal care.  Assessment and Plan:  Pregnancy: G1P0 at [redacted]w[redacted]d  1. HTN in pregnancy, chronic, third trimester Stable BP on Methyldopa and Labetalol.  Patient instructed to stop ASA therapy. Next growth  scan is on 02/14/16. - Amniotic fluid index with NST  2. Group B Streptococcus carrier, +RV culture, currently pregnant Patient aware of diagnosis and need for intrapartum treatment  3. Abnormal urinalysis Had abnormal UA x 2 weeks, will check culture. No symptoms of UTI. - Culture, OB Urine  4. Supervision of high-risk pregnancy, third trimester Preterm labor symptoms and general obstetric precautions including but not limited to vaginal bleeding, contractions, leaking of fluid and fetal movement were reviewed in detail with the patient. Please refer to After Visit Summary for other counseling recommendations.  Return in about 4 days (around 02/11/2016) for as scheduled.   Osborne Oman, MD

## 2016-02-08 ENCOUNTER — Encounter: Payer: Self-pay | Admitting: *Deleted

## 2016-02-08 LAB — CULTURE, OB URINE
COLONY COUNT: NO GROWTH
ORGANISM ID, BACTERIA: NO GROWTH

## 2016-02-11 ENCOUNTER — Ambulatory Visit (INDEPENDENT_AMBULATORY_CARE_PROVIDER_SITE_OTHER): Payer: Medicaid Other | Admitting: General Practice

## 2016-02-11 ENCOUNTER — Other Ambulatory Visit: Payer: BLUE CROSS/BLUE SHIELD

## 2016-02-11 VITALS — BP 128/76 | HR 89

## 2016-02-11 DIAGNOSIS — O10913 Unspecified pre-existing hypertension complicating pregnancy, third trimester: Secondary | ICD-10-CM

## 2016-02-11 NOTE — Progress Notes (Signed)
NST reactive.

## 2016-02-14 ENCOUNTER — Encounter (HOSPITAL_COMMUNITY): Payer: Self-pay

## 2016-02-14 ENCOUNTER — Other Ambulatory Visit: Payer: BLUE CROSS/BLUE SHIELD

## 2016-02-14 ENCOUNTER — Encounter (HOSPITAL_COMMUNITY): Payer: Self-pay | Admitting: *Deleted

## 2016-02-14 ENCOUNTER — Inpatient Hospital Stay (HOSPITAL_COMMUNITY)
Admission: AD | Admit: 2016-02-14 | Discharge: 2016-02-14 | Disposition: A | Discharge status: Home / Self Care | Payer: Medicaid Other | Source: Ambulatory Visit | Attending: Obstetrics & Gynecology | Admitting: Obstetrics & Gynecology

## 2016-02-14 ENCOUNTER — Ambulatory Visit (HOSPITAL_COMMUNITY)
Admission: RE | Admit: 2016-02-14 | Discharge: 2016-02-14 | Disposition: A | Discharge status: Home / Self Care | Payer: Medicaid Other | Source: Ambulatory Visit | Attending: Obstetrics & Gynecology | Admitting: Obstetrics & Gynecology

## 2016-02-14 ENCOUNTER — Other Ambulatory Visit (HOSPITAL_COMMUNITY): Payer: Self-pay | Admitting: Obstetrics and Gynecology

## 2016-02-14 DIAGNOSIS — Z3A37 37 weeks gestation of pregnancy: Secondary | ICD-10-CM

## 2016-02-14 DIAGNOSIS — Z22330 Carrier of Group B streptococcus: Secondary | ICD-10-CM | POA: Diagnosis not present

## 2016-02-14 DIAGNOSIS — O169 Unspecified maternal hypertension, unspecified trimester: Secondary | ICD-10-CM

## 2016-02-14 DIAGNOSIS — O210 Mild hyperemesis gravidarum: Secondary | ICD-10-CM

## 2016-02-14 DIAGNOSIS — O99213 Obesity complicating pregnancy, third trimester: Secondary | ICD-10-CM

## 2016-02-14 DIAGNOSIS — O283 Abnormal ultrasonic finding on antenatal screening of mother: Secondary | ICD-10-CM | POA: Insufficient documentation

## 2016-02-14 DIAGNOSIS — Z79899 Other long term (current) drug therapy: Secondary | ICD-10-CM | POA: Insufficient documentation

## 2016-02-14 DIAGNOSIS — O289 Unspecified abnormal findings on antenatal screening of mother: Secondary | ICD-10-CM

## 2016-02-14 DIAGNOSIS — O10919 Unspecified pre-existing hypertension complicating pregnancy, unspecified trimester: Secondary | ICD-10-CM

## 2016-02-14 DIAGNOSIS — O10913 Unspecified pre-existing hypertension complicating pregnancy, third trimester: Secondary | ICD-10-CM

## 2016-02-14 DIAGNOSIS — O10013 Pre-existing essential hypertension complicating pregnancy, third trimester: Secondary | ICD-10-CM | POA: Insufficient documentation

## 2016-02-14 DIAGNOSIS — O99820 Streptococcus B carrier state complicating pregnancy: Secondary | ICD-10-CM

## 2016-02-14 DIAGNOSIS — E669 Obesity, unspecified: Secondary | ICD-10-CM | POA: Diagnosis not present

## 2016-02-14 LAB — COMPREHENSIVE METABOLIC PANEL
ALBUMIN: 2.8 g/dL — AB (ref 3.5–5.0)
ALK PHOS: 86 U/L (ref 38–126)
ALT: 10 U/L — AB (ref 14–54)
ANION GAP: 7 (ref 5–15)
AST: 13 U/L — AB (ref 15–41)
BILIRUBIN TOTAL: 0.4 mg/dL (ref 0.3–1.2)
BUN: 11 mg/dL (ref 6–20)
CALCIUM: 8.9 mg/dL (ref 8.9–10.3)
CO2: 21 mmol/L — AB (ref 22–32)
CREATININE: 0.57 mg/dL (ref 0.44–1.00)
Chloride: 107 mmol/L (ref 101–111)
GFR calc Af Amer: >60 mL/min (ref >60)
GFR calc non Af Amer: >60 mL/min (ref >60)
GLUCOSE: 89 mg/dL (ref 65–99)
Potassium: 3.7 mmol/L (ref 3.5–5.1)
SODIUM: 135 mmol/L (ref 135–145)
TOTAL PROTEIN: 6.3 g/dL — AB (ref 6.5–8.1)

## 2016-02-14 LAB — URINALYSIS, ROUTINE W REFLEX MICROSCOPIC
BILIRUBIN URINE: NEGATIVE
GLUCOSE, UA: NEGATIVE mg/dL
HGB URINE DIPSTICK: NEGATIVE
Ketones, ur: NEGATIVE mg/dL
Nitrite: NEGATIVE
PH: 6.5 (ref 5.0–8.0)
Protein, ur: NEGATIVE mg/dL
SPECIFIC GRAVITY, URINE: 1.02 (ref 1.005–1.030)

## 2016-02-14 LAB — CBC
HEMATOCRIT: 30.3 % — AB (ref 36.0–46.0)
HEMOGLOBIN: 9.9 g/dL — AB (ref 12.0–15.0)
MCH: 25.3 pg — ABNORMAL LOW (ref 26.0–34.0)
MCHC: 32.7 g/dL (ref 30.0–36.0)
MCV: 77.3 fL — AB (ref 78.0–100.0)
Platelets: 188 10*3/uL (ref 150–400)
RBC: 3.92 MIL/uL (ref 3.87–5.11)
RDW: 15 % (ref 11.5–15.5)
WBC: 8.1 10*3/uL (ref 4.0–10.5)

## 2016-02-14 LAB — URINE MICROSCOPIC-ADD ON

## 2016-02-14 LAB — PROTEIN / CREATININE RATIO, URINE
CREATININE, URINE: 228 mg/dL
Protein Creatinine Ratio: 0.11 mg/mg{Cre} (ref 0.00–0.15)
Total Protein, Urine: 24 mg/dL

## 2016-02-14 MED ORDER — LABETALOL HCL 200 MG PO TABS: 400.0000 mg | ORAL_TABLET | Freq: Three times a day (TID) | ORAL | Status: DC

## 2016-02-14 NOTE — Discharge Instructions (Signed)
It was so nice to meet you!  We tested you for pre-eclampsia and you did not have it. Please continue to take your blood pressure medications as you are now.    SEEK MEDICAL CARE IF:  You are gaining more weight than expected.  You have any headaches, abdominal pain, or nausea.  You are bruising more than usual.  You feel dizzy or light-headed. SEEK IMMEDIATE MEDICAL CARE IF:   You develop sudden or severe swelling anywhere in your body. This usually happens in the legs.  You gain 5 lb (2.3 kg) or more in a week.  You have a severe headache, dizziness, problems with your vision, or confusion.  You have severe abdominal pain.  You have lasting nausea or vomiting.  You have a seizure.  You have trouble moving any part of your body.  You develop numbness in your body.  You have trouble speaking.  You have any abnormal bleeding.  You develop a stiff neck.  You pass out. MAKE SURE YOU:   Understand these instructions.  Will watch your condition.  Will get help right away if you are not doing well or get worse.   This information is not intended to replace advice given to you by your health care provider. Make sure you discuss any questions you have with your health care provider.   Document Released: 09/26/2000 Document Revised: 10/04/2013 Document Reviewed: 07/22/2013 Elsevier Interactive Patient Education [employer].

## 2016-02-14 NOTE — MAU Provider Note (Signed)
History     CSN: [id number]  Arrival date and time: 02/14/16 4098   First Provider Initiated Contact with Patient 02/14/16 [phone]      Chief Complaint  Patient presents with  . Hypertension   HPI  This is a 30 year old G1P0 at 84w6dwho was sent to the MAU from clinic for elevated BPs at MFM. BPs were 180/121 and 163/111 in the MAU. She also endorsed 8/10 headache. She has a history of chronic hypertension and takes Labetalol '400mg'$  tid and Methyldopa '250mg'$  bid. Her Labetalol was recently decreased from '600mg'$  tid to '400mg'$  tid because she was having dizziness throughout the day that were thought to be secondary to low blood pressures. She took her blood pressure medications this morning at 0700 and her MFM appointment was at 0730.  In the MAU, BPs were 154/91, 164/98, 144/95, and 128/81. No medications were given. She states that her headache has greatly improved. She denies any blurry vision or RUQ pain.  She denies any vaginal bleeding, leakage of fluids, or contractions. She endorses fetal movement.  Past Medical History  Diagnosis Date  . Hypertension   . Obesity in pregnancy     Antepartum, first trimester  . URI (upper respiratory infection)   . Condyloma acuminata     Past Surgical History  Procedure Laterality Date  . No past surgeries      Family History  Problem Relation Age of Onset  . Hypertension Mother   . Heart disease Mother     Social History  Substance Use Topics  . Smoking status: Never Smoker   . Smokeless tobacco: Never Used  . Alcohol Use: No    Allergies: No Known Allergies  Prescriptions prior to admission  Medication Sig Dispense Refill Last Dose  . labetalol (NORMODYNE) 200 MG tablet Take 3 tablets (600 mg total) by mouth 3 (three) times daily. (Patient taking differently: Take 400 mg by mouth 3 (three) times daily. ) 270 tablet 3 02/14/2016 at 0700  . methyldopa (ALDOMET) 250 MG tablet Take 1 tablet (250 mg total) by mouth 2 (two) times daily.  60 tablet 6 02/14/2016 at 0700  . Prenatal Vit-Min-FA-Fish Oil ([employer] PRENATAL GUMMY PO) Take 2 each by mouth daily.   Past Week at Unknown time  . aspirin EC 81 MG tablet Take 1 tablet (81 mg total) by mouth daily. 90 tablet 3 Taking  . triamcinolone cream (KENALOG) 0.1 % Apply 1 application topically 2 (two) times daily. (Patient not taking: Reported on 02/14/2016) 30 g 0 Not Taking at Unknown time    Review of Systems  Constitutional: Negative for fever and chills.  Eyes: Negative for blurred vision.  Respiratory: Negative for shortness of breath.   Cardiovascular: Negative for chest pain.  Gastrointestinal: Negative for heartburn, nausea, vomiting and abdominal pain.  Genitourinary: Negative for dysuria.  Musculoskeletal: Negative for myalgias.  Skin: Negative for rash.  Neurological: Negative for dizziness, weakness and headaches.  Endo/Heme/Allergies: Negative for environmental allergies.   Physical Exam   Blood pressure 139/85, pulse 87, temperature 97.9 F (36.6 C), temperature source Oral, resp. rate 18, last menstrual period 05/25/2015, SpO2 100 %.  Physical Exam  Nursing note and vitals reviewed. Constitutional: She is oriented to person, place, and time. She appears well-developed and well-nourished. No distress.  Eyes: No scleral icterus.  Neck: Normal range of motion.  Cardiovascular: Normal rate.   Respiratory: Effort normal.  GI: Soft. There is no tenderness.  gravid  Musculoskeletal: Normal range of  motion.  Neurological: She is alert and oriented to person, place, and time.  Skin: Skin is warm and dry. No rash noted.    MAU Course  Procedures  MDM This is a G1P0 at 5w6dwho was sent to the MAU from MFM with elevated blood pressures and concern for pre-eclampsia. Since being in the MAU, her BPs have decreased to 128/81 without intervention. Her headache has greatly improved and she denies blurred vision and RUQ/epigastric pain. Her BPs were likely elevated because  she had just taken her medications 30 minutes before her BP was checked.. CBC and CMET did not indicated pre-eclampsia. UPC was 0.11.  Results for orders placed or performed during the hospital encounter of 02/14/16 (from the past 48 hour(s))  Urinalysis, [facility]     Status: Abnormal   Collection Time: 02/14/16  9:00 AM  Result Value Ref Range   Color, Urine YELLOW YELLOW   APPearance CLEAR CLEAR   Specific Gravity, Urine 1.020 1.005 - 1.030   pH 6.5 5.0 - 8.0   Glucose, UA NEGATIVE NEGATIVE mg/dL   Hgb urine dipstick NEGATIVE NEGATIVE   Bilirubin Urine NEGATIVE NEGATIVE   Ketones, ur NEGATIVE NEGATIVE mg/dL   Protein, ur NEGATIVE NEGATIVE mg/dL   Nitrite NEGATIVE NEGATIVE   Leukocytes, UA TRACE (A) NEGATIVE  Protein / creatinine ratio, urine     Status: None   Collection Time: 02/14/16  9:00 AM  Result Value Ref Range   Creatinine, Urine 228.00 mg/dL   Total Protein, Urine 24 mg/dL    Comment: NO NORMAL RANGE ESTABLISHED FOR THIS TEST   Protein Creatinine Ratio 0.11 0.00 - 0.15 mg/mg[Cre]  Urine microscopic-add on     Status: Abnormal   Collection Time: 02/14/16  9:00 AM  Result Value Ref Range   Squamous Epithelial / LPF 0-5 (A) NONE SEEN   WBC, UA 0-5 0 - 5 WBC/hpf   RBC / HPF 0-5 0 - 5 RBC/hpf   Bacteria, UA MANY (A) NONE SEEN   Urine-Other MUCOUS PRESENT   Comprehensive metabolic panel     Status: Abnormal   Collection Time: 02/14/16  9:47 AM  Result Value Ref Range   Sodium 135 135 - 145 mmol/L   Potassium 3.7 3.5 - 5.1 mmol/L   Chloride 107 101 - 111 mmol/L   CO2 21 (L) 22 - 32 mmol/L   Glucose, Bld 89 65 - 99 mg/dL   BUN 11 6 - 20 mg/dL   Creatinine, Ser 0.57 0.44 - 1.00 mg/dL   Calcium 8.9 8.9 - 10.3 mg/dL   Total Protein 6.3 (L) 6.5 - 8.1 g/dL   Albumin 2.8 (L) 3.5 - 5.0 g/dL   AST 13 (L) 15 - 41 U/L   ALT 10 (L) 14 - 54 U/L   Alkaline Phosphatase 86 38 - 126 U/L   Total Bilirubin 0.4 0.3 - 1.2 mg/dL   GFR calc non Af Amer  >60 >60 mL/min   GFR calc Af Amer >60 >60 mL/min    Comment: (NOTE) The eGFR has been calculated using the CKD EPI equation. This calculation has not been validated in all clinical situations. eGFR's persistently <60 mL/min signify possible Chronic Kidney Disease.    Anion gap 7 5 - 15  CBC     Status: Abnormal   Collection Time: 02/14/16  9:47 AM  Result Value Ref Range   WBC 8.1 4.0 - 10.5 K/uL   RBC 3.92 3.87 - 5.11 MIL/uL  Hemoglobin 9.9 (L) 12.0 - 15.0 g/dL   HCT 30.3 (L) 36.0 - 46.0 %   MCV 77.3 (L) 78.0 - 100.0 fL   MCH 25.3 (L) 26.0 - 34.0 pg   MCHC 32.7 30.0 - 36.0 g/dL   RDW 15.0 11.5 - 15.5 %   Platelets 188 150 - 400 K/uL    Assessment and Plan  #Chronic Hypertension in Pregnancy: Without superimposed pre-eclampsia. - Continue Labetalol 444m tid and Methyldopa 2526mbid - Family member that lives with patient has a BP cuff. Advised Patient to check her BPs at least twice a day and write them down to bring to her next MFM appointment - Return precautions given - Continue routine prenatal care  KaEvette Doffing/01/2016, 11:19 AM   OB fellow attestation: I have seen and examined this patient; I agree with above documentation in the resident's note.   InNigeriJimmye Normans a [age].o. G1P0 reporting HA with elevated BP in MFM. Patient reported taking BP meds at 0700 and arrived for 0730 appt. HA not associated with blurry vision/changes in vision. HA feels like the ones "she has when her BP is up."  Denies RUQ pain. Denies sudden weight gain.  +FM, denies LOF, VB, contractions, vaginal discharge.  PE: BP 138/85 mmHg  Pulse 91  Temp(Src) 97.9 F (36.6 C) (Oral)  Resp 16  SpO2 100%  LMP 05/25/2015 Gen: calm comfortable, NAD Resp: normal effort, no distress Abd: gravid  ROS, labs, PMH reviewed NST reactive   Plan: # Concern for superimposed Preeclampsia:  Reassured by history, improving HA, improving BP here without interventions.  - Patient will check her BP  BID - Discussed return precautions for preeclampsia.  - Patient has NST on 5/8 and IOL scheduled on 5/12 - continue routine follow up in OB clinic - fetal kick counts reinforced,  labor precautions   KiCaren MacadamMD , MPH, ABWickliffeOB Fellow [facility]

## 2016-02-14 NOTE — MAU Note (Signed)
Patient was had HTN in MFM this morning and was sent over to be checked.  Patient reports headache this AM.  Reports +fetal movement.  Denies LOF or vaginal bleeding.

## 2016-02-18 ENCOUNTER — Ambulatory Visit (INDEPENDENT_AMBULATORY_CARE_PROVIDER_SITE_OTHER): Payer: Medicaid Other | Admitting: *Deleted

## 2016-02-18 VITALS — BP 137/92 | HR 84

## 2016-02-18 DIAGNOSIS — O10913 Unspecified pre-existing hypertension complicating pregnancy, third trimester: Secondary | ICD-10-CM

## 2016-02-18 NOTE — Progress Notes (Signed)
Pt denies H/A or visual disturbances @ present

## 2016-02-18 NOTE — Progress Notes (Signed)
NST reactive.

## 2016-02-19 ENCOUNTER — Telehealth (HOSPITAL_COMMUNITY): Payer: Self-pay | Admitting: *Deleted

## 2016-02-19 NOTE — Telephone Encounter (Signed)
Preadmission screen  

## 2016-02-21 ENCOUNTER — Ambulatory Visit (INDEPENDENT_AMBULATORY_CARE_PROVIDER_SITE_OTHER): Payer: Medicaid Other | Admitting: Family Medicine

## 2016-02-21 VITALS — BP 135/89 | HR 99 | Wt 253.1 lb

## 2016-02-21 DIAGNOSIS — O99213 Obesity complicating pregnancy, third trimester: Secondary | ICD-10-CM | POA: Diagnosis not present

## 2016-02-21 DIAGNOSIS — Z36 Encounter for antenatal screening of mother: Secondary | ICD-10-CM

## 2016-02-21 DIAGNOSIS — O10913 Unspecified pre-existing hypertension complicating pregnancy, third trimester: Secondary | ICD-10-CM

## 2016-02-21 DIAGNOSIS — E669 Obesity, unspecified: Secondary | ICD-10-CM

## 2016-02-21 DIAGNOSIS — O0993 Supervision of high risk pregnancy, unspecified, third trimester: Secondary | ICD-10-CM

## 2016-02-21 LAB — POCT URINALYSIS DIP (DEVICE)
Glucose, UA: NEGATIVE mg/dL
HGB URINE DIPSTICK: NEGATIVE
Ketones, ur: NEGATIVE mg/dL
Nitrite: NEGATIVE
PH: 6.5 (ref 5.0–8.0)
Protein, ur: 100 mg/dL — AB
SPECIFIC GRAVITY, URINE: 1.025 (ref 1.005–1.030)
Urobilinogen, UA: 1 mg/dL (ref 0.0–1.0)

## 2016-02-21 NOTE — Progress Notes (Signed)
Subjective:  [name] [name] is a 30 y.o. G1P0 at [redacted]w[redacted]d being seen today for ongoing prenatal care.  She is currently monitored for the following issues for this high-risk pregnancy and has Supervision of high-risk pregnancy; HTN in pregnancy, chronic; HTN (hypertension); Obesity affecting pregnancy, antepartum; Obesity; Abnormal quad screen; Hyperemesis gravidarum; and Group B Streptococcus carrier, +RV culture, currently pregnant on her problem list.  Patient reports no complaints.  Contractions: Irregular. Vag. Bleeding: None.  Movement: Present. Denies leaking of fluid.   The following portions of the patient's history were reviewed and updated as appropriate: allergies, current medications, past family history, past medical history, past social history, past surgical history and problem list. Problem list updated.  Objective:   Filed Vitals:   02/21/16 1101  BP: 135/89  Pulse: 99  Weight: 253 lb 1.6 oz (114.805 kg)    Fetal Status: Fetal Heart Rate (bpm): NST   Movement: Present  Presentation: Vertex  General:  Alert, oriented and cooperative. Patient is in no acute distress.  Skin: Skin is warm and dry. No rash noted.   Cardiovascular: Normal heart rate noted  Respiratory: Normal respiratory effort, no problems with respiration noted  Abdomen: Soft, gravid, appropriate for gestational age. Pain/Pressure: Present     Pelvic: Vag. Bleeding: None Vag D/C Character: White   Cervical exam deferred        Extremities: Normal range of motion.  Edema: None  Mental Status: Normal mood and affect. Normal behavior. Normal judgment and thought content.   Urinalysis: Urine Protein: 2+ Urine Glucose: Negative  Assessment and Plan:  Pregnancy: G1P0 at [redacted]w[redacted]d  1. HTN in pregnancy, chronic, third trimester NST reactive  BP controlled.  AFI normal - Amniotic fluid index with NST  2. Supervision of high-risk pregnancy, third trimester Induction tomorrow.  Term labor symptoms and general  obstetric precautions including but not limited to vaginal bleeding, contractions, leaking of fluid and fetal movement were reviewed in detail with the patient. Please refer to After Visit Summary for other counseling recommendations.  Return in about 6 weeks (around 04/03/2016) for PP visit.  IOL on 5/12.   Truett Mainland, DO

## 2016-02-21 NOTE — Progress Notes (Signed)
Pt reports having white vaginal discharge and itching. States, "I have been struggling with yeast the whole pregnancy."  IOL tomorrow @ 0630

## 2016-02-22 ENCOUNTER — Inpatient Hospital Stay (HOSPITAL_COMMUNITY)
Admission: RE | Admit: 2016-02-22 | Discharge: 2016-02-29 | DRG: 765 | Disposition: A | Discharge status: Home / Self Care | Payer: Medicaid Other | Source: Ambulatory Visit | Attending: Family Medicine | Admitting: Family Medicine

## 2016-02-22 ENCOUNTER — Encounter (HOSPITAL_COMMUNITY): Payer: Self-pay

## 2016-02-22 VITALS — BP 149/90 | HR 94 | Temp 98.7°F | Resp 17 | Ht 63.0 in | Wt 254.5 lb

## 2016-02-22 DIAGNOSIS — O115 Pre-existing hypertension with pre-eclampsia, complicating the puerperium: Secondary | ICD-10-CM | POA: Diagnosis not present

## 2016-02-22 DIAGNOSIS — Z6841 Body Mass Index (BMI) 40.0 and over, adult: Secondary | ICD-10-CM | POA: Diagnosis not present

## 2016-02-22 DIAGNOSIS — O280 Abnormal hematological finding on antenatal screening of mother: Secondary | ICD-10-CM | POA: Diagnosis present

## 2016-02-22 DIAGNOSIS — O1092 Unspecified pre-existing hypertension complicating childbirth: Secondary | ICD-10-CM | POA: Diagnosis not present

## 2016-02-22 DIAGNOSIS — Z8249 Family history of ischemic heart disease and other diseases of the circulatory system: Secondary | ICD-10-CM | POA: Diagnosis not present

## 2016-02-22 DIAGNOSIS — O1002 Pre-existing essential hypertension complicating childbirth: Secondary | ICD-10-CM | POA: Diagnosis present

## 2016-02-22 DIAGNOSIS — O99820 Streptococcus B carrier state complicating pregnancy: Secondary | ICD-10-CM

## 2016-02-22 DIAGNOSIS — O99824 Streptococcus B carrier state complicating childbirth: Secondary | ICD-10-CM | POA: Diagnosis present

## 2016-02-22 DIAGNOSIS — O1003 Pre-existing essential hypertension complicating the puerperium: Secondary | ICD-10-CM | POA: Diagnosis not present

## 2016-02-22 DIAGNOSIS — O10913 Unspecified pre-existing hypertension complicating pregnancy, third trimester: Secondary | ICD-10-CM

## 2016-02-22 DIAGNOSIS — O0990 Supervision of high risk pregnancy, unspecified, unspecified trimester: Secondary | ICD-10-CM

## 2016-02-22 DIAGNOSIS — O0993 Supervision of high risk pregnancy, unspecified, third trimester: Secondary | ICD-10-CM

## 2016-02-22 DIAGNOSIS — Z3A39 39 weeks gestation of pregnancy: Secondary | ICD-10-CM

## 2016-02-22 DIAGNOSIS — O210 Mild hyperemesis gravidarum: Secondary | ICD-10-CM

## 2016-02-22 DIAGNOSIS — O99214 Obesity complicating childbirth: Secondary | ICD-10-CM | POA: Diagnosis present

## 2016-02-22 DIAGNOSIS — O99210 Obesity complicating pregnancy, unspecified trimester: Secondary | ICD-10-CM | POA: Diagnosis present

## 2016-02-22 DIAGNOSIS — O10919 Unspecified pre-existing hypertension complicating pregnancy, unspecified trimester: Secondary | ICD-10-CM | POA: Diagnosis present

## 2016-02-22 DIAGNOSIS — O99211 Obesity complicating pregnancy, first trimester: Secondary | ICD-10-CM

## 2016-02-22 LAB — COMPREHENSIVE METABOLIC PANEL
ALBUMIN: 2.8 g/dL — AB (ref 3.5–5.0)
ALT: 10 U/L — AB (ref 14–54)
AST: 16 U/L (ref 15–41)
Alkaline Phosphatase: 90 U/L (ref 38–126)
Anion gap: 10 (ref 5–15)
BUN: 12 mg/dL (ref 6–20)
CALCIUM: 8.6 mg/dL — AB (ref 8.9–10.3)
CO2: 19 mmol/L — ABNORMAL LOW (ref 22–32)
Chloride: 108 mmol/L (ref 101–111)
Creatinine, Ser: 0.56 mg/dL (ref 0.44–1.00)
GFR calc Af Amer: >60 mL/min (ref >60)
GFR calc non Af Amer: >60 mL/min (ref >60)
Glucose, Bld: 99 mg/dL (ref 65–99)
POTASSIUM: 3.7 mmol/L (ref 3.5–5.1)
SODIUM: 137 mmol/L (ref 135–145)
TOTAL PROTEIN: 5.8 g/dL — AB (ref 6.5–8.1)
Total Bilirubin: 0.2 mg/dL — ABNORMAL LOW (ref 0.3–1.2)

## 2016-02-22 LAB — CBC
HCT: 31.2 % — ABNORMAL LOW (ref 36.0–46.0)
Hemoglobin: 10.3 g/dL — ABNORMAL LOW (ref 12.0–15.0)
MCH: 25.3 pg — AB (ref 26.0–34.0)
MCHC: 33 g/dL (ref 30.0–36.0)
MCV: 76.7 fL — ABNORMAL LOW (ref 78.0–100.0)
PLATELETS: 200 10*3/uL (ref 150–400)
RBC: 4.07 MIL/uL (ref 3.87–5.11)
RDW: 15.2 % (ref 11.5–15.5)
WBC: 8.1 10*3/uL (ref 4.0–10.5)

## 2016-02-22 LAB — TYPE AND SCREEN
ABO/RH(D): O POS
Antibody Screen: NEGATIVE

## 2016-02-22 LAB — PROTEIN / CREATININE RATIO, URINE
Creatinine, Urine: 165 mg/dL
PROTEIN CREATININE RATIO: 0.2 mg/mg{creat} — AB (ref 0.00–0.15)
TOTAL PROTEIN, URINE: 33 mg/dL

## 2016-02-22 LAB — ABO/RH: ABO/RH(D): O POS

## 2016-02-22 LAB — RPR: RPR Ser Ql: NONREACTIVE

## 2016-02-22 LAB — URIC ACID: URIC ACID, SERUM: 6.2 mg/dL (ref 2.3–6.6)

## 2016-02-22 LAB — LACTATE DEHYDROGENASE: LDH: 146 U/L (ref 98–192)

## 2016-02-22 MED ORDER — ZOLPIDEM TARTRATE 5 MG PO TABS: 5.0000 mg | ORAL_TABLET | Freq: Every evening | ORAL | Status: DC | PRN

## 2016-02-22 MED ORDER — CITRIC ACID-SODIUM CITRATE 334-500 MG/5ML PO SOLN
30.0000 mL | ORAL | Status: DC | PRN
  Filled 2016-02-22: qty 15

## 2016-02-22 MED ORDER — PENICILLIN G POTASSIUM 5000000 UNITS IJ SOLR
5.0000 10*6.[IU] | Freq: Once | INTRAVENOUS | Status: DC
  Filled 2016-02-22: qty 5

## 2016-02-22 MED ORDER — OXYTOCIN 40 UNITS IN LACTATED RINGERS INFUSION - SIMPLE MED
2.5000 [IU]/h | INTRAVENOUS | Status: DC
  Filled 2016-02-22: qty 1000

## 2016-02-22 MED ORDER — LACTATED RINGERS IV SOLN
INTRAVENOUS | Status: DC
Start: 2016-02-22 — End: 2016-02-26
  Administered 2016-02-22: 23:00:00 via INTRAVENOUS
  Administered 2016-02-22: 1000 mL via INTRAVENOUS
  Administered 2016-02-23 (×2): via INTRAVENOUS
  Administered 2016-02-24: 125 mL/h via INTRAVENOUS
  Administered 2016-02-24 – 2016-02-25 (×3): via INTRAVENOUS

## 2016-02-22 MED ORDER — PENICILLIN G POTASSIUM 5000000 UNITS IJ SOLR
2.5000 10*6.[IU] | INTRAVENOUS | Status: DC
  Filled 2016-02-22 (×4): qty 2.5

## 2016-02-22 MED ORDER — ONDANSETRON HCL 4 MG/2ML IJ SOLN
4.0000 mg | Freq: Four times a day (QID) | INTRAMUSCULAR | Status: DC | PRN
  Administered 2016-02-22: 4 mg via INTRAVENOUS
  Filled 2016-02-22: qty 2

## 2016-02-22 MED ORDER — TERBUTALINE SULFATE 1 MG/ML IJ SOLN: 0.2500 mg | Freq: Once | INTRAMUSCULAR | Status: DC | PRN

## 2016-02-22 MED ORDER — LABETALOL HCL 5 MG/ML IV SOLN
INTRAVENOUS | Status: AC
  Filled 2016-02-22: qty 4

## 2016-02-22 MED ORDER — ACETAMINOPHEN 325 MG PO TABS
650.0000 mg | ORAL_TABLET | ORAL | Status: DC | PRN
  Administered 2016-02-24: 650 mg via ORAL
  Filled 2016-02-22: qty 2

## 2016-02-22 MED ORDER — LABETALOL HCL 200 MG PO TABS
400.0000 mg | ORAL_TABLET | Freq: Three times a day (TID) | ORAL | Status: DC
  Administered 2016-02-22 – 2016-02-25 (×11): 400 mg via ORAL
  Filled 2016-02-22 (×13): qty 2

## 2016-02-22 MED ORDER — LABETALOL HCL 5 MG/ML IV SOLN
40.0000 mg | Freq: Once | INTRAVENOUS | Status: AC
  Administered 2016-02-22: 40 mg via INTRAVENOUS
  Filled 2016-02-22: qty 8

## 2016-02-22 MED ORDER — OXYCODONE-ACETAMINOPHEN 5-325 MG PO TABS: 2.0000 | ORAL_TABLET | ORAL | Status: DC | PRN

## 2016-02-22 MED ORDER — LABETALOL HCL 5 MG/ML IV SOLN: 20.0000 mg | INTRAVENOUS | Status: DC | PRN

## 2016-02-22 MED ORDER — METHYLDOPA 500 MG PO TABS
500.0000 mg | ORAL_TABLET | Freq: Two times a day (BID) | ORAL | Status: DC
  Administered 2016-02-22 – 2016-02-25 (×7): 500 mg via ORAL
  Filled 2016-02-22 (×8): qty 1

## 2016-02-22 MED ORDER — PENICILLIN G POTASSIUM 5000000 UNITS IJ SOLR: 2.5000 10*6.[IU] | INTRAVENOUS | Status: DC

## 2016-02-22 MED ORDER — METHYLDOPA 250 MG PO TABS
250.0000 mg | ORAL_TABLET | Freq: Two times a day (BID) | ORAL | Status: DC
  Administered 2016-02-22: 250 mg via ORAL
  Filled 2016-02-22 (×3): qty 1

## 2016-02-22 MED ORDER — MISOPROSTOL 50MCG HALF TABLET
50.0000 ug | ORAL_TABLET | ORAL | Status: DC | PRN
  Administered 2016-02-22 – 2016-02-23 (×3): 50 ug via ORAL
  Filled 2016-02-22 (×3): qty 0.5

## 2016-02-22 MED ORDER — LACTATED RINGERS IV SOLN
500.0000 mL | INTRAVENOUS | Status: DC | PRN
  Administered 2016-02-25: 1000 mL via INTRAVENOUS

## 2016-02-22 MED ORDER — LIDOCAINE HCL (PF) 1 % IJ SOLN: 30.0000 mL | INTRAMUSCULAR | Status: DC | PRN

## 2016-02-22 MED ORDER — MISOPROSTOL 25 MCG QUARTER TABLET
25.0000 ug | ORAL_TABLET | ORAL | Status: DC | PRN
  Administered 2016-02-22 (×2): 25 ug via VAGINAL
  Filled 2016-02-22 (×2): qty 0.25

## 2016-02-22 MED ORDER — PENICILLIN G POTASSIUM 5000000 UNITS IJ SOLR: 5.0000 10*6.[IU] | Freq: Once | INTRAVENOUS | Status: DC

## 2016-02-22 MED ORDER — FLEET ENEMA 7-19 GM/118ML RE ENEM
1.0000 | ENEMA | RECTAL | Status: DC | PRN
Start: 2016-02-22 — End: 2016-02-26

## 2016-02-22 MED ORDER — FENTANYL CITRATE (PF) 100 MCG/2ML IJ SOLN
100.0000 ug | INTRAMUSCULAR | Status: DC | PRN
  Administered 2016-02-22 – 2016-02-25 (×4): 100 ug via INTRAVENOUS
  Filled 2016-02-22 (×4): qty 2

## 2016-02-22 MED ORDER — OXYTOCIN BOLUS FROM INFUSION
500.0000 mL | INTRAVENOUS | Status: DC
Start: 2016-02-22 — End: 2016-02-26

## 2016-02-22 MED ORDER — HYDRALAZINE HCL 20 MG/ML IJ SOLN
5.0000 mg | INTRAMUSCULAR | Status: AC | PRN
  Administered 2016-02-22: 10 mg via INTRAVENOUS
  Administered 2016-02-22: 5 mg via INTRAVENOUS

## 2016-02-22 MED ORDER — FENTANYL CITRATE (PF) 100 MCG/2ML IJ SOLN
INTRAMUSCULAR | Status: AC
  Filled 2016-02-22: qty 2

## 2016-02-22 MED ORDER — HYDRALAZINE HCL 20 MG/ML IJ SOLN
5.0000 mg | Freq: Once | INTRAMUSCULAR | Status: DC
  Filled 2016-02-22: qty 1

## 2016-02-22 MED ORDER — OXYCODONE-ACETAMINOPHEN 5-325 MG PO TABS: 1.0000 | ORAL_TABLET | ORAL | Status: DC | PRN

## 2016-02-22 NOTE — Progress Notes (Signed)
Labor Progress Note [name] [name] is a 30 y.o. G1P0 at [redacted]w[redacted]d presented for IOl for cHTN  S: Feeling well. Resting on her side. Reports crampy contractions.   O:  BP 152/91 mmHg  Pulse 89  Temp(Src) 98.8 F (37.1 C) (Oral)  Resp 18  Ht 5\' 3"  (1.6 m)  Wt 253 lb (114.76 kg)  BMI 44.83 kg/m2  LMP 05/25/2015   EFM: 120/mod/+accels, no decels  CVE: Dilation: Fingertip Effacement (%): Thick Cervical Position: Posterior Station: -3 Presentation: Vertex Exam by:: [name] [name]   A&P: 30 y.o. G1P0 [redacted]w[redacted]d here for IOL for cHTN #Labor: Placed #2 cytotec. Plan for FB when able #Pain: prn fentanyl #FWB: Cat I #GBS Pos, on PCN  Caren Macadam, MD 2:41 PM

## 2016-02-22 NOTE — H&P (Signed)
.  Subjective:  [name] [name] is a 30 y.o. G1 P0 female at 41 and 0/[redacted] weeks gestation (by last menstrual period) who is being admitted for induction of labor given cHTN.  Her current obstetrical history is significant for GBS colonizer, chronic hypertension (managed during pregnancy with a-methyldopa and labetalol).  Patient denies RUQ pain, but endorses occasional headaches; states that blood pressures have been well maintained on oral medications. Fetal Movement: normal.     Objective:   Vital signs in last 24 hours: Temp:  [98.7 F (37.1 C)] 98.7 F (37.1 C) (05/12 0801) Pulse Rate:  [94-101] 101 (05/12 0745) Resp:  [18] 18 (05/12 0801) BP: (135-180)/(89-115) 169/107 mmHg (05/12 0745) Weight:  [114.76 kg (253 lb)-114.805 kg (253 lb 1.6 oz)] 114.76 kg (253 lb) (05/12 0801)   General:   alert and cooperative  Skin:   normal  HEENT:  PERRLA  Lungs:   clear to auscultation bilaterally  Heart:   regular rate and rhythm, S1, S2 normal, no murmur, click, rub or gallop  Breasts:   not examined  Abdomen:  soft, non-tender; bowel sounds normal; no masses,  no organomegaly  Pelvis:    FHT:   BPM  Uterine Size: size equals dates  Presentations: cephalic  Cervix: Fingertip dilation as examined by Dr. Ernestina Patches   Dilation: 1cm   Effacement: thick   Station:  -3   Consistency: firm   Position: anterior   Lab Review  GBS positive  AFP:NML  One hour GTT: Normal    Assessment/Plan:  39 and 0/[redacted] weeks gestation. Not in labor. Obstetrical history significant for chronic hypertension and GBS positive (will give IV penicillin) .     Risks, benefits, alternatives and possible complications have been discussed in detail with the patient.  Pre-admission, admission, and post admission procedures and expectations were discussed in detail.  All questions answered, all appropriate consents will be signed at the Hospital. Admission is planned for today.   Induction planned with vaginal cytotec to be  followed by Foley balloon.    Patient plans to use an epidural for anesthesia.  She is having a boy, whom she will have circumcised at an outside facility, she plans to use the Nexplanon for birth control.

## 2016-02-22 NOTE — H&P (Signed)
LABOR AND DELIVERY ADMISSION HISTORY AND PHYSICAL NOTE  [name] [name] is a 30 y.o. female G1P0 with IUP at [redacted]w[redacted]d by LMP presenting for IOL for cHTN.   She reports positive fetal movement. She denies leakage of fluid or vaginal bleeding.  She endorses headaches for the last few days. She denies blurry vision, or RUQ/epigastric pain. Per patient's friend, her BPs have been "in the 200s" over the past week.  Prenatal History/Complications:  -cHTN on Labetalol 400mg  tid and Aldomet 250mg  bid  -Abnormal quad screen with 1:180 down syndrome risk. Seen by genetic counseling. Declined NIPS and amniocentesis.  -Obesity in pregnancy  Past Medical History: Past Medical History  Diagnosis Date  . Hypertension   . Obesity in pregnancy     Antepartum, first trimester  . URI (upper respiratory infection)   . Condyloma acuminata     Past Surgical History: Past Surgical History  Procedure Laterality Date  . No past surgeries      Obstetrical History: OB History    Gravida Para Term Preterm AB TAB SAB Ectopic Multiple Living   1               Social History: Social History   Social History  . Marital Status: Single    Spouse Name: n/a  . Number of Children: 0  . Years of Education: college   Occupational History  . Customer Service Rep    Social History Main Topics  . Smoking status: Never Smoker   . Smokeless tobacco: Never Used  . Alcohol Use: No  . Drug Use: No  . Sexual Activity: Yes    Birth Control/ Protection: None   Other Topics Concern  . None   Social History Narrative   Lives alone.  No family lives nearby.    Family History: Family History  Problem Relation Age of Onset  . Hypertension Mother   . Heart disease Mother     Allergies: No Known Allergies  Prescriptions prior to admission  Medication Sig Dispense Refill Last Dose  . labetalol (NORMODYNE) 200 MG tablet Take 2 tablets (400 mg total) by mouth 3 (three) times daily. 270 tablet 3 Taking   . methyldopa (ALDOMET) 250 MG tablet Take 1 tablet (250 mg total) by mouth 2 (two) times daily. 60 tablet 6 Taking  . Prenatal Vit-Min-FA-Fish Oil ([employer] PRENATAL GUMMY PO) Take 2 each by mouth daily.   Taking  . triamcinolone cream (KENALOG) 0.1 % Apply 1 application topically 2 (two) times daily. (Patient not taking: Reported on 02/14/2016) 30 g 0 Not Taking     Review of Systems   All systems reviewed and negative except as stated in HPI  Blood pressure 172/117, pulse 97, temperature 98.7 F (37.1 C), temperature source Oral, resp. rate 18, height 5\' 3"  (1.6 m), weight 253 lb (114.76 kg), last menstrual period 05/25/2015. General appearance: alert, cooperative and no distress Lungs: clear to auscultation bilaterally Heart: regular rate and rhythm Abdomen: soft, non-tender; bowel sounds normal Extremities: No calf swelling or tenderness Presentation: cephalic by ultrasound Fetal monitoring: 120bpm, moderate variability, accelerations present, no decelerations. Uterine activity: None Dilation: Fingertip Effacement (%): Thick Station: -3 Exam by:: Nichele Slawson   Prenatal labs: ABO, Rh: O/Positive/-- (10/25 0000) Antibody: Negative (10/25 0000) Rubella: !Error! RPR: NON REAC (02/20 1003)  HBsAg: Negative (10/25 0000)  HIV: NONREACTIVE (02/20 1003)  GBS: Positive (04/20 0000)  1 hr Glucola: Failed 1 hour, passed 3 hour Genetic screening: Abnormal quad screen with increased risk of Down  syndrome Anatomy US: Normal  Prenatal Transfer Tool  Maternal Diabetes: No Genetic Screening: Abnormal:  Results: Elevated risk of Trisomy 21 Maternal Ultrasounds/Referrals: Normal Fetal Ultrasounds or other Referrals:  Referred to Materal Fetal Medicine and genetic counseling Maternal Substance Abuse:  No Significant Maternal Medications:  Meds include: Other: Labetalol, Aldomet Significant Maternal Lab Results: Lab values include: Group B Strep positive  Results for orders placed or performed  during the hospital encounter of 02/22/16 (from the past 24 hour(s))  CBC   Collection Time: 02/22/16  8:00 AM  Result Value Ref Range   WBC 8.1 4.0 - 10.5 K/uL   RBC 4.07 3.87 - 5.11 MIL/uL   Hemoglobin 10.3 (L) 12.0 - 15.0 g/dL   HCT 31.2 (L) 36.0 - 46.0 %   MCV 76.7 (L) 78.0 - 100.0 fL   MCH 25.3 (L) 26.0 - 34.0 pg   MCHC 33.0 30.0 - 36.0 g/dL   RDW 15.2 11.5 - 15.5 %   Platelets 200 150 - 400 K/uL  Results for orders placed or performed in visit on 02/21/16 (from the past 24 hour(s))  POCT urinalysis dip (device)   Collection Time: 02/21/16 10:57 AM  Result Value Ref Range   Glucose, UA NEGATIVE NEGATIVE mg/dL   Bilirubin Urine SMALL (A) NEGATIVE   Ketones, ur NEGATIVE NEGATIVE mg/dL   Specific Gravity, Urine 1.025 1.005 - 1.030   Hgb urine dipstick NEGATIVE NEGATIVE   pH 6.5 5.0 - 8.0   Protein, ur 100 (A) NEGATIVE mg/dL   Urobilinogen, UA 1.0 0.0 - 1.0 mg/dL   Nitrite NEGATIVE NEGATIVE   Leukocytes, UA TRACE (A) NEGATIVE    Patient Active Problem List   Diagnosis Date Noted  . Hypertension 02/22/2016  . Group B Streptococcus carrier, +RV culture, currently pregnant 02/03/2016  . Hyperemesis gravidarum 10/24/2015  . Abnormal quad screen 10/18/2015  . Supervision of high-risk pregnancy 09/24/2015  . HTN in pregnancy, chronic 09/24/2015  . HTN (hypertension) 09/24/2015  . Obesity affecting pregnancy, antepartum 09/24/2015  . Obesity 09/24/2015    Assessment: [name] [name] is a 30 y.o. G1P0 at [redacted]w[redacted]d here for IOL for cHTN.  #Labor: IOL. Currently fingertip. Cytotec started at 0900. Will place Foley when able. #Pain: Currently well-controlled. Plan for epidural #FWB: Category I #ID: GBS pos- PCN #MOF: Breast #MOC: Nexplanon #Circ: Outpatient #cHTN: Will continue home medications: Labetalol 400mg  tid and Aldomet 250mg  bid. Pt states she has been having a headache for the last few days. Pre-eclampsia labs ordered.  Berna Spare Mayo 02/22/2016, 9:10 AM   OB fellow  attestation:  I have seen and examined this patient; I agree with above documentation in the resident's note.   [name] [name] is a 30 y.o. G1P0 here for IOL for cHTN  PE: BP 160/81 mmHg  Pulse 91  Temp(Src) 97.9 F (36.6 C) (Axillary)  Resp 20  Ht 5\' 3"  (1.6 m)  Wt 253 lb (114.76 kg)  BMI 44.83 kg/m2  LMP 05/25/2015 Gen: calm comfortable, NAD Resp: normal effort, no distress Abd: gravid  ROS, labs, PMH reviewed  Plan: Admit to LD Labor: Induction for cHTN, plan on cytotec until appropriate for FB FWB:Cat I ID: GBS pos CHTN: continue current regimen with Labetalol and aldomet. PRN antihypertensives. Alba labs negative thus does not meet criteria for preeclampsia. No need for magnesium at this point.    Caren Macadam, MD  Family Medicine, OB Fellow 02/22/2016, 7:09 PM

## 2016-02-22 NOTE — Anesthesia Pain Management Evaluation Note (Signed)
  CRNA Pain Management Visit Note  Patient: [name] [name], 30 y.o., female  "Hello I am a member of the anesthesia team at [facility]. We have an anesthesia team available at all times to provide care throughout the hospital, including epidural management and anesthesia for C-section. I don't know your plan for the delivery whether it a natural birth, water birth, IV sedation, nitrous supplementation, doula or epidural, but we want to meet your pain goals."   1.Was your pain managed to your expectations on prior hospitalizations? No previous labor 2.What is your expectation for pain management during this hospitalization?     Epidural  3.How can we help you reach that goal? Pt wants labor epidural  Record the patient's initial score and the patient's pain goal.   Pain: 0  Pain Goal: 4 [facility] wants you to be able to say your pain was always managed very well.  Aubreana Cornacchia 02/22/2016

## 2016-02-22 NOTE — Progress Notes (Signed)
Called by RN for elevated BPs. Has had Labetalol and due for aldomet at 2000. Denying sx to RN.   Filed Vitals:   02/22/16 1300 02/22/16 1312 02/22/16 1550 02/22/16 1654  BP: 140/77 152/91 171/97 178/93  Pulse: 78 89 82 88  Temp:   97.9 F (36.6 C)   TempSrc:   Axillary   Resp:   18 20  Height:      Weight:       Instructed RN to give IV Labetalol (40mg ). Will reassess. [facility] labs were normal this morning.   Caren Macadam, MD , MPH, ABFM Family Medicine, OB Fellow [facility]

## 2016-02-23 DIAGNOSIS — O10913 Unspecified pre-existing hypertension complicating pregnancy, third trimester: Secondary | ICD-10-CM

## 2016-02-23 DIAGNOSIS — Z3A39 39 weeks gestation of pregnancy: Secondary | ICD-10-CM

## 2016-02-23 MED ORDER — MISOPROSTOL 25 MCG QUARTER TABLET
25.0000 ug | ORAL_TABLET | ORAL | Status: DC | PRN
  Administered 2016-02-23 (×2): 25 ug via VAGINAL
  Filled 2016-02-23 (×2): qty 0.25

## 2016-02-23 NOTE — Progress Notes (Signed)
Patient ID: [name] [name], female   DOB: [birth year], 30 y.o.   MRN: [id number] [name] [name] is a 30 y.o. G1P0 at [redacted]w[redacted]d admitted for induction of labor due to [facility].  Subjective: Feeling some uc's. Denies ha, visual changes, ruq/epigastric pain, n/v.    Objective: BP 156/93 mmHg  Pulse 88  Temp(Src) 98.3 F (36.8 C) (Oral)  Resp 16  Ht 5\' 3"  (1.6 m)  Wt 114.76 kg (253 lb)  BMI 44.83 kg/m2  LMP 05/25/2015    FHT:  FHR: 135 bpm, variability: moderate,  accelerations:  Present,  decelerations:  Absent UC:   q 2-37mins  SVE:   Dilation: 1 Effacement (%): Thick Station: -3 Exam by:: Doree Fudge, CNM  Cervical foley bulb inserted and inflated w/ 22ml LR w/o difficulty   Labs: Lab Results  Component Value Date   WBC 8.1 02/22/2016   HGB 10.3* 02/22/2016   HCT 31.2* 02/22/2016   MCV 76.7* 02/22/2016   PLT 200 02/22/2016    Assessment / Plan: IOL d/t CHTN, s/p cytotec x 7, cervical foley bulb now in place, plan for pitocin when it falls out  Labor: cervical ripening Fetal Wellbeing:  Category I Pain Control:  n/a Pre-eclampsia: labs normal I/D:  pcn for gbs+ Anticipated MOD:  NSVD  Tawnya Crook CNM, WHNP-BC 02/23/2016, 9:11 PM

## 2016-02-23 NOTE — Progress Notes (Signed)
Patient ID: [name] [name], female   DOB: November 04, 1985, 30 y.o.   MRN: [id number] [name] [name] is a 30 y.o. G1P0 at [redacted]w[redacted]d admitted for induction of labor due to [facility].  Subjective: Doing well, feeling some uc's  Objective: BP 154/104 mmHg  Pulse 91  Temp(Src) 98.7 F (37.1 C) (Oral)  Resp 18  Ht 5\' 3"  (1.6 m)  Wt 114.76 kg (253 lb)  BMI 44.83 kg/m2  LMP 05/25/2015  Last bp taken 130s/80s (not filed yet)  FHT:  FHR: 125 bpm, variability: moderate,  accelerations:  Present,  decelerations:  Absent UC:   irregular  SVE:   Cl/th/-3  Labs: Lab Results  Component Value Date   WBC 8.1 02/22/2016   HGB 10.3* 02/22/2016   HCT 31.2* 02/22/2016   MCV 76.7* 02/22/2016   PLT 200 02/22/2016    Assessment / Plan: IOL d/t CHTN, s/p cytotec vaginally x 2, then oral x 3- will switch back to vaginal for now. Plan foley bulb when able  Labor: cervical ripening phase Fetal Wellbeing:  Category I Pain Control:  n/a Pre-eclampsia: labs normal I/D:  pcn for gbs+ Anticipated MOD:  NSVD  Tawnya Crook CNM, WHNP-BC 02/23/2016, 10:17 AM

## 2016-02-23 NOTE — Progress Notes (Signed)
[name] [name] spoke with Knute Neu CNM regarding elevated BP and need to treat. Will go ahead and medicate with scheduled PO meds at this time. No other s/s of preeclampsia at this time. Will continue to monitor.

## 2016-02-24 DIAGNOSIS — O10913 Unspecified pre-existing hypertension complicating pregnancy, third trimester: Secondary | ICD-10-CM

## 2016-02-24 DIAGNOSIS — Z3A39 39 weeks gestation of pregnancy: Secondary | ICD-10-CM

## 2016-02-24 LAB — PROTEIN / CREATININE RATIO, URINE
Creatinine, Urine: 50 mg/dL
PROTEIN CREATININE RATIO: 0.38 mg/mg{creat} — AB (ref 0.00–0.15)
TOTAL PROTEIN, URINE: 19 mg/dL

## 2016-02-24 MED ORDER — PENICILLIN G POTASSIUM 5000000 UNITS IJ SOLR
2.5000 10*6.[IU] | INTRAVENOUS | Status: DC
  Administered 2016-02-24 – 2016-02-25 (×7): 2.5 10*6.[IU] via INTRAVENOUS
  Filled 2016-02-24 (×10): qty 2.5

## 2016-02-24 MED ORDER — MISOPROSTOL 50MCG HALF TABLET
50.0000 ug | ORAL_TABLET | ORAL | Status: DC
  Administered 2016-02-24: 50 ug via ORAL
  Filled 2016-02-24: qty 0.5

## 2016-02-24 MED ORDER — OXYTOCIN 40 UNITS IN LACTATED RINGERS INFUSION - SIMPLE MED
1.0000 m[IU]/min | INTRAVENOUS | Status: DC
  Administered 2016-02-24 – 2016-02-25 (×2): 2 m[IU]/min via INTRAVENOUS
  Filled 2016-02-24: qty 1000

## 2016-02-24 MED ORDER — DEXTROSE 5 % IV SOLN
5.0000 10*6.[IU] | Freq: Once | INTRAVENOUS | Status: AC
  Administered 2016-02-24: 5 10*6.[IU] via INTRAVENOUS
  Filled 2016-02-24: qty 5

## 2016-02-24 MED ORDER — TERBUTALINE SULFATE 1 MG/ML IJ SOLN: 0.2500 mg | Freq: Once | INTRAMUSCULAR | Status: DC | PRN

## 2016-02-24 NOTE — Progress Notes (Signed)
[name] [name] is a 30 y.o. G1P0 at [redacted]w[redacted]d by ultrasound admitted for induction of labor due to Hypertension.  Subjective:   Objective: BP 139/78 mmHg  Pulse 86  Temp(Src) 99.2 F (37.3 C) (Oral)  Resp 18  Ht 5\' 3"  (1.6 m)  Wt 253 lb (114.76 kg)  BMI 44.83 kg/m2  LMP 05/25/2015      FHT:  FHR: 120-130 bpm, variability: moderate,  accelerations:  Present,  decelerations:  Absent UC:   regular, every 3-5 minutes SVE:   Dilation: 1.5 (around foley bulb) Effacement (%): Thick Station: -3 Exam by:: M.Merrill, RN  Labs: Lab Results  Component Value Date   WBC 8.1 02/22/2016   HGB 10.3* 02/22/2016   HCT 31.2* 02/22/2016   MCV 76.7* 02/22/2016   PLT 200 02/22/2016    Assessment / Plan: yet to be in labor, continue present poc  Labor: yet to be in labor Preeclampsia:  intake and ouput balanced and labs stable Fetal Wellbeing:  Category I Pain Control:  Labor support without medications I/D:  n/a Anticipated MOD:  NSVD  [name] [name] 02/24/2016, 2:28 PM

## 2016-02-24 NOTE — Progress Notes (Signed)
[name] [name] is a 30 y.o. G1P0 at [redacted]w[redacted]d by ultrasound admitted for induction of labor due to Hypertension.  Subjective:   Objective: BP 160/95 mmHg  Pulse 88  Temp(Src) 98.6 F (37 C) (Oral)  Resp 16  Ht 5\' 3"  (1.6 m)  Wt 253 lb (114.76 kg)  BMI 44.83 kg/m2  LMP 05/25/2015      FHT:  FHR: 120-130 bpm, variability: moderate,  accelerations:  Present,  decelerations:  Absent UC:   regular, every 3-5 minutes and mild.  SVE:   Dilation: 4 Effacement (%): Thick Station: Ballotable Exam by:: M.Merrill, RN  Labs: Lab Results  Component Value Date   WBC 8.1 02/22/2016   HGB 10.3* 02/22/2016   HCT 31.2* 02/22/2016   MCV 76.7* 02/22/2016   PLT 200 02/22/2016    Assessment / Plan: Induction of labor due to [facility] medical conditions,  progressing well on pitocin  Labor: yet to be in labor Preeclampsia:  no signs or symptoms of toxicity and intake and ouput balanced Fetal Wellbeing:  Category I Pain Control:  Labor support without medications I/D:  sleeping thru contractions Anticipated MOD:  NSVD  [name] 02/24/2016, 5:11 PM

## 2016-02-24 NOTE — Progress Notes (Signed)
[name] [name] is a 30 y.o. G1P0 at [redacted]w[redacted]d by ultrasound admitted for induction of labor due to Hypertension.  Subjective:   Objective: BP 165/95 mmHg  Pulse 91  Temp(Src) 99.3 F (37.4 C) (Oral)  Resp 18  Ht 5\' 3"  (1.6 m)  Wt 253 lb (114.76 kg)  BMI 44.83 kg/m2  LMP 05/25/2015      FHT:  FHR: 120-130 bpm, variability: moderate,  accelerations:  Present,  decelerations:  Absent UC:   irregular, every 6-10 minutes and mild SVE:   Dilation:  (cervix around foley bulb) Effacement (%): Thick Station: -3 Exam by:: [name] [name], CNM  Labs: Lab Results  Component Value Date   WBC 8.1 02/22/2016   HGB 10.3* 02/22/2016   HCT 31.2* 02/22/2016   MCV 76.7* 02/22/2016   PLT 200 02/22/2016    Assessment / Plan: Induction of labor due to [facility] medical conditions,  progressing well on pitocin  Labor: not yet in labor Preeclampsia:  intake and ouput balanced and labs stable Fetal Wellbeing:  Category I Pain Control:  Labor support without medications I/D:  n/a Anticipated MOD:  NSVD  [name] [name] 02/24/2016, 10:03 AM

## 2016-02-24 NOTE — Progress Notes (Signed)
Patient ID: [name] [name], female   DOB: 10-03-1986, 30 y.o.   MRN: [id number] [name] [name] is a 30 y.o. G1P0 at 29w2dadmitted for induction of labor due to [facility]  Subjective: Doing well, cramping  Objective: BP 149/94 mmHg  Pulse 91  Temp(Src) 99.3 F (37.4 C) (Oral)  Resp 18  Ht 5' 3"  (1.6 m)  Wt 114.76 kg (253 lb)  BMI 44.83 kg/m2  LMP 05/25/2015    FHT:  FHR: 125 bpm, variability: moderate,  accelerations:  Present,  decelerations:  Absent UC:   irregular  Pulled on foley bulb, met resistance, SVE reveals cx still all the way around balloon   Labs: Lab Results  Component Value Date   WBC 8.1 02/22/2016   HGB 10.3* 02/22/2016   HCT 31.2* 02/22/2016   MCV 76.7* 02/22/2016   PLT 200 02/22/2016    Assessment / Plan: IOL d/t CHTN, foley bulb still in, plan for pitocin once it falls out, has had 8 doses of cytotec total- contracting too much to place last dose ~0600, will hold for now in case foley bulb falls out soon so can start pitocin w/o delay  Labor: cervical ripening Fetal Wellbeing:  Category I Pain Control:  Labor support without medications Pre-eclampsia: labs normal I/D:  pcn for gbs+ Anticipated MOD:  NSVD  BTawnya CrookCNM, WHNP-BC 02/24/2016, 7:47 AM

## 2016-02-25 ENCOUNTER — Inpatient Hospital Stay (HOSPITAL_COMMUNITY): Payer: Medicaid Other | Admitting: Anesthesiology

## 2016-02-25 ENCOUNTER — Encounter (HOSPITAL_COMMUNITY)
Admission: RE | Disposition: A | Discharge status: Home / Self Care | Payer: Self-pay | Source: Ambulatory Visit | Attending: Family Medicine

## 2016-02-25 LAB — CBC
HCT: 32.8 % — ABNORMAL LOW (ref 36.0–46.0)
Hemoglobin: 10.6 g/dL — ABNORMAL LOW (ref 12.0–15.0)
MCH: 25.3 pg — ABNORMAL LOW (ref 26.0–34.0)
MCHC: 32.3 g/dL (ref 30.0–36.0)
MCV: 78.3 fL (ref 78.0–100.0)
PLATELETS: 216 10*3/uL (ref 150–400)
RBC: 4.19 MIL/uL (ref 3.87–5.11)
RDW: 15.4 % (ref 11.5–15.5)
WBC: 10.5 10*3/uL (ref 4.0–10.5)

## 2016-02-25 SURGERY — Surgical Case
Anesthesia: Epidural

## 2016-02-25 MED ORDER — EPHEDRINE 5 MG/ML INJ: 10.0000 mg | INTRAVENOUS | Status: DC | PRN

## 2016-02-25 MED ORDER — CEFAZOLIN SODIUM-DEXTROSE 2-4 GM/100ML-% IV SOLN
INTRAVENOUS | Status: AC
  Filled 2016-02-25: qty 100

## 2016-02-25 MED ORDER — FENTANYL 2.5 MCG/ML BUPIVACAINE 1/10 % EPIDURAL INFUSION (WH - ANES)
14.0000 mL/h | INTRAMUSCULAR | Status: DC | PRN
  Administered 2016-02-25 (×3): 14 mL/h via EPIDURAL
  Filled 2016-02-25 (×2): qty 125

## 2016-02-25 MED ORDER — PHENYLEPHRINE 40 MCG/ML (10ML) SYRINGE FOR IV PUSH (FOR BLOOD PRESSURE SUPPORT)
80.0000 ug | PREFILLED_SYRINGE | INTRAVENOUS | Status: DC | PRN
  Filled 2016-02-25: qty 10

## 2016-02-25 MED ORDER — LACTATED RINGERS IV SOLN
INTRAVENOUS | Status: DC | PRN
  Administered 2016-02-25: via INTRAVENOUS

## 2016-02-25 MED ORDER — MORPHINE SULFATE (PF) 0.5 MG/ML IJ SOLN
INTRAMUSCULAR | Status: AC
  Filled 2016-02-25: qty 10

## 2016-02-25 MED ORDER — ONDANSETRON HCL 4 MG/2ML IJ SOLN
INTRAMUSCULAR | Status: DC | PRN
  Administered 2016-02-25: 4 mg via INTRAVENOUS

## 2016-02-25 MED ORDER — DIPHENHYDRAMINE HCL 50 MG/ML IJ SOLN: 12.5000 mg | INTRAMUSCULAR | Status: DC | PRN

## 2016-02-25 MED ORDER — LIDOCAINE HCL (PF) 1 % IJ SOLN
INTRAMUSCULAR | Status: DC | PRN
  Administered 2016-02-25 (×2): 4 mL via EPIDURAL

## 2016-02-25 MED ORDER — MORPHINE SULFATE (PF) 0.5 MG/ML IJ SOLN
INTRAMUSCULAR | Status: DC | PRN
  Administered 2016-02-25: 3500 ug via EPIDURAL

## 2016-02-25 MED ORDER — DEXAMETHASONE SODIUM PHOSPHATE 10 MG/ML IJ SOLN
INTRAMUSCULAR | Status: DC | PRN
  Administered 2016-02-25: 10 mg via INTRAVENOUS

## 2016-02-25 MED ORDER — DEXTROSE 5 % IV SOLN: 3.0000 g | INTRAVENOUS | Status: DC

## 2016-02-25 MED ORDER — OXYTOCIN 10 UNIT/ML IJ SOLN
40.0000 [IU] | INTRAMUSCULAR | Status: DC | PRN
  Administered 2016-02-25: 40 [IU] via INTRAVENOUS

## 2016-02-25 MED ORDER — OXYTOCIN 10 UNIT/ML IJ SOLN
INTRAMUSCULAR | Status: AC
  Filled 2016-02-25: qty 4

## 2016-02-25 MED ORDER — LACTATED RINGERS IV SOLN
500.0000 mL | Freq: Once | INTRAVENOUS | Status: AC
  Administered 2016-02-25: 1000 mL via INTRAVENOUS

## 2016-02-25 MED ORDER — CITRIC ACID-SODIUM CITRATE 334-500 MG/5ML PO SOLN
30.0000 mL | ORAL | Status: AC
  Administered 2016-02-25: 30 mL via ORAL

## 2016-02-25 MED ORDER — PHENYLEPHRINE 40 MCG/ML (10ML) SYRINGE FOR IV PUSH (FOR BLOOD PRESSURE SUPPORT): 80.0000 ug | PREFILLED_SYRINGE | INTRAVENOUS | Status: DC | PRN

## 2016-02-25 MED ORDER — DEXAMETHASONE SODIUM PHOSPHATE 10 MG/ML IJ SOLN
INTRAMUSCULAR | Status: AC
  Filled 2016-02-25: qty 1

## 2016-02-25 MED ORDER — ONDANSETRON HCL 4 MG/2ML IJ SOLN
INTRAMUSCULAR | Status: AC
  Filled 2016-02-25: qty 2

## 2016-02-25 MED ORDER — LABETALOL HCL 300 MG PO TABS
600.0000 mg | ORAL_TABLET | Freq: Three times a day (TID) | ORAL | Status: DC
  Administered 2016-02-25: 600 mg via ORAL
  Filled 2016-02-25 (×3): qty 2

## 2016-02-25 MED ORDER — CEFAZOLIN SODIUM-DEXTROSE 2-4 GM/100ML-% IV SOLN: 2.0000 g | INTRAVENOUS | Status: DC

## 2016-02-25 MED ORDER — LACTATED RINGERS IV SOLN
INTRAVENOUS | Status: DC | PRN
  Administered 2016-02-25 (×2): via INTRAVENOUS

## 2016-02-25 MED ORDER — LIDOCAINE-EPINEPHRINE (PF) 2 %-1:200000 IJ SOLN
INTRAMUSCULAR | Status: AC
  Filled 2016-02-25: qty 20

## 2016-02-25 MED ORDER — LIDOCAINE-EPINEPHRINE (PF) 2 %-1:200000 IJ SOLN
INTRAMUSCULAR | Status: DC | PRN
  Administered 2016-02-25: 5 mL via EPIDURAL
  Administered 2016-02-25: 7 mL via EPIDURAL
  Administered 2016-02-25: 5 mL via EPIDURAL

## 2016-02-25 SURGICAL SUPPLY — 37 items
APL SKNCLS STERI-STRIP NONHPOA (GAUZE/BANDAGES/DRESSINGS) ×1
BENZOIN TINCTURE PRP APPL 2/3 (GAUZE/BANDAGES/DRESSINGS) ×2 IMPLANT
CATH ROBINSON RED A/P 16FR (CATHETERS) IMPLANT
CLAMP CORD UMBIL (MISCELLANEOUS) IMPLANT
CLOTH BEACON ORANGE TIMEOUT ST (SAFETY) ×2 IMPLANT
CLSR STERI-STRIP ANTIMIC 1/2X4 (GAUZE/BANDAGES/DRESSINGS) ×1 IMPLANT
DRSG OPSITE POSTOP 4X10 (GAUZE/BANDAGES/DRESSINGS) ×2 IMPLANT
DURAPREP 26ML APPLICATOR (WOUND CARE) ×2 IMPLANT
ELECT REM PT RETURN 9FT ADLT (ELECTROSURGICAL) ×2
ELECTRODE REM PT RTRN 9FT ADLT (ELECTROSURGICAL) ×1 IMPLANT
EXTRACTOR VACUUM M CUP 4 TUBE (SUCTIONS) IMPLANT
GLOVE BIOGEL PI IND STRL 7.0 (GLOVE) ×1 IMPLANT
GLOVE BIOGEL PI IND STRL 7.5 (GLOVE) ×2 IMPLANT
GLOVE BIOGEL PI INDICATOR 7.0 (GLOVE) ×1
GLOVE BIOGEL PI INDICATOR 7.5 (GLOVE) ×2
GLOVE ECLIPSE 7.5 STRL STRAW (GLOVE) ×2 IMPLANT
GOWN STRL REUS W/TWL LRG LVL3 (GOWN DISPOSABLE) ×6 IMPLANT
KIT ABG SYR 3ML LUER SLIP (SYRINGE) IMPLANT
NDL HYPO 25X5/8 SAFETYGLIDE (NEEDLE) IMPLANT
NEEDLE HYPO 25X5/8 SAFETYGLIDE (NEEDLE) IMPLANT
NS IRRIG 1000ML POUR BTL (IV SOLUTION) ×2 IMPLANT
PACK C SECTION WH (CUSTOM PROCEDURE TRAY) ×2 IMPLANT
PAD OB MATERNITY 4.3X12.25 (PERSONAL CARE ITEMS) ×2 IMPLANT
PENCIL SMOKE EVAC W/HOLSTER (ELECTROSURGICAL) ×1 IMPLANT
RTRCTR C-SECT PINK 25CM LRG (MISCELLANEOUS) ×2 IMPLANT
STRIP CLOSURE SKIN 1/2X4 (GAUZE/BANDAGES/DRESSINGS) ×2 IMPLANT
SUT MNCRL 0 VIOLET CTX 36 (SUTURE) IMPLANT
SUT MONOCRYL 0 CTX 36 (SUTURE)
SUT PLAIN 2 0 XLH (SUTURE) ×1 IMPLANT
SUT VIC AB 0 CT1 36 (SUTURE) ×1 IMPLANT
SUT VIC AB 0 CTX 36 (SUTURE) ×6
SUT VIC AB 0 CTX36XBRD ANBCTRL (SUTURE) ×3 IMPLANT
SUT VIC AB 2-0 CT1 27 (SUTURE) ×2
SUT VIC AB 2-0 CT1 TAPERPNT 27 (SUTURE) ×1 IMPLANT
SUT VIC AB 4-0 KS 27 (SUTURE) ×2 IMPLANT
TOWEL OR 17X24 6PK STRL BLUE (TOWEL DISPOSABLE) ×2 IMPLANT
TRAY FOLEY CATH SILVER 14FR (SET/KITS/TRAYS/PACK) ×2 IMPLANT

## 2016-02-25 NOTE — Progress Notes (Signed)
LABOR PROGRESS NOTE  [name] [name] is a 30 y.o. G1P0 at [redacted]w[redacted]d  admitted for iol for chtn  Subjective: Fed up with induction. No ha, vision change, epigastric/ruq pain, or sob  Objective: BP 164/95 mmHg  Pulse 92  Temp(Src) 98.8 F (37.1 C) (Oral)  Resp 20  Ht 5\' 3"  (1.6 m)  Wt 253 lb (114.76 kg)  BMI 44.83 kg/m2  SpO2 100%  LMP 05/25/2015 or  Filed Vitals:   02/25/16 2110 02/25/16 2111 02/25/16 2116 02/25/16 2117  BP:    164/95  Pulse: 89 89 91 92  Temp:      TempSrc:      Resp:      Height:      Weight:      SpO2: 100% 100% 100%     110/mod/+a/occasional late and prolonged decel 4 min  Dilation: 4 Effacement (%): Thick Cervical Position: Posterior Station: -3 Presentation: Vertex Exam by:: Fatima Blank, CNM  Labs: Lab Results  Component Value Date   WBC 10.5 02/25/2016   HGB 10.6* 02/25/2016   HCT 32.8* 02/25/2016   MCV 78.3 02/25/2016   PLT 216 02/25/2016    Patient Active Problem List   Diagnosis Date Noted  . Group B Streptococcus carrier, +RV culture, currently pregnant 02/03/2016  . Hyperemesis gravidarum 10/24/2015  . Abnormal quad screen 10/18/2015  . Supervision of high-risk pregnancy 09/24/2015  . HTN in pregnancy, chronic 09/24/2015  . Obesity affecting pregnancy, antepartum 09/24/2015    Assessment / Plan: 30 y.o. G1P0 at [redacted]w[redacted]d here for iol 2/2 chtn  Labor: for recent prolonged decel stopped pit, moved, started o2, with recovery. Will stop pit one hour, re-start @ 14, and if further decels likely headed for c/s Fetal Wellbeing:  Cat 2 as above Pain Control:  S/p epidural Chtn: scattered severe-range bp this pm. Increasing labetalol to 600 tid. No symptoms severe disease. Continuing to monitor.  Desma Maxim, MD 02/25/2016, 9:18 PM

## 2016-02-25 NOTE — Anesthesia Pain Management Evaluation Note (Signed)
  CRNA Pain Management Visit Note  Patient: [name] [name], 30 y.o., female  "Hello I am a member of the anesthesia team at [facility]. We have an anesthesia team available at all times to provide care throughout the hospital, including epidural management and anesthesia for C-section. I don't know your plan for the delivery whether it a natural birth, water birth, IV sedation, nitrous supplementation, doula or epidural, but we want to meet your pain goals."   1.Was your pain managed to your expectations on prior hospitalizations?   Unable to assess - patient sleeping  2.What is your expectation for pain management during this hospitalization?     Epidural  3.How can we help you reach that goal? Patient wants epidural  Record the patient's initial score and the patient's pain goal.   Pain: Patient sleeping - unable to assess  Pain Goal: 5 [facility] wants you to be able to say your pain was always managed very well.  [facility] 02/25/2016

## 2016-02-25 NOTE — Progress Notes (Signed)
[name] [name] is a 30 y.o. G1P0 at [redacted]w[redacted]d admitted for induction of labor due to [facility].  Subjective: Pt comfortable with epidural. Called to bedside by RN for deceleration.  Objective: BP 135/85 mmHg  Pulse 81  Temp(Src) 98.6 F (37 C) (Oral)  Resp 20  Ht 5\' 3"  (1.6 m)  Wt 114.76 kg (253 lb)  BMI 44.83 kg/m2  SpO2 100%  LMP 05/25/2015 I/O last 3 completed shifts: In: 120 [P.O.:120] Out: -  Total I/O In: -  Out: 300 [Urine:300]  FHT:  FHR: 150 bpm, variability: moderate,  accelerations:  Present,  decelerations:  Present repetitive early decels followed by prolonged decels x 2 lasting 5-6 minutes down to 90, then repetitive late decels x3 UC:   regular, every 3-4 minutes SVE:   Dilation: 4 Effacement (%): Thick Station: -3 Exam by:: Fatima Blank, CNM   Scalp electrode applied for improved FHR tracing. Pt tolerated well.  Positive scalp stimulation during exam.  Labs: Lab Results  Component Value Date   WBC 10.5 02/25/2016   HGB 10.6* 02/25/2016   HCT 32.8* 02/25/2016   MCV 78.3 02/25/2016   PLT 216 02/25/2016    Assessment / Plan: Induction of labor due to [facility],  progressing well on pitocin  Labor: Pitocin rate halved during deceleration, will increase when FHR tracing improves Preeclampsia:  n/a Fetal Wellbeing:  Category II Pain Control:  Epidural I/D:  n/a Anticipated MOD:  NSVD  LEFTWICH-KIRBY, Michaela Shankel 02/25/2016, 4:21 PM

## 2016-02-25 NOTE — Progress Notes (Signed)
[name] [name] is a 30 y.o. G1P0 at [redacted]w[redacted]d by LMP c/w 7 week [place] admitted for induction of labor due to [facility].  Subjective: Pt comfortable until last 30 minutes, when she is breathing through contractions.  Family in room for support.  Objective: BP 167/112 mmHg  Pulse 83  Temp(Src) 98.7 F (37.1 C) (Oral)  Resp 18  Ht 5\' 3"  (1.6 m)  Wt 253 lb (114.76 kg)  BMI 44.83 kg/m2  LMP 05/25/2015 I/O last 3 completed shifts: In: 120 [P.O.:120] Out: -     FHT:  FHR: 130 bpm, variability: moderate,  accelerations:  Present,  decelerations:  Absent UC:   irregular, every 2-20 minutes SVE:   Dilation: 4 Effacement (%): Thick Station: -3 Exam by:: [employer]. CNM BBOW noted during contraction, vertex position, still ballotable at this time  Labs: Lab Results  Component Value Date   WBC 8.1 02/22/2016   HGB 10.3* 02/22/2016   HCT 31.2* 02/22/2016   MCV 76.7* 02/22/2016   PLT 200 02/22/2016    Assessment / Plan: Induction of labor due to CHTN GBS positive on PCN S/P Foley bulb and Cytotec (PO and PV) S/P Pitocin break, restart at 4 am today   Labor: Slow progress of labor after FB out.  Pt starting to respond to Pitocin with painful contractions for the first time within the last hour.  Plan to continue Pitocin, recheck in 2 hours, consider AROM at that time if fetal vertex well applied. Preeclampsia:  labs stable Fetal Wellbeing:  Category I Pain Control:  Labor support without medications and IV medications ordered, also may have epidural when desired I/D:  n/a Anticipated MOD:  NSVD  LEFTWICH-KIRBY, [name] [name] 02/25/2016, 9:55 AM

## 2016-02-25 NOTE — Progress Notes (Signed)
[name] [name] is a 30 y.o. G1P0 at 101w3d by ultrasound admitted for induction of labor due to Hypertension.  Subjective:   Objective: BP 134/77 mmHg  Pulse 98  Temp(Src) 99 F (37.2 C) (Oral)  Resp 16  Ht 5\' 3"  (1.6 m)  Wt 253 lb (114.76 kg)  BMI 44.83 kg/m2  LMP 05/25/2015 I/O last 3 completed shifts: In: 120 [P.O.:120] Out: -     FHT:  FHR: 130s bpm, variability: moderate,  accelerations:  Present,  decelerations:  Absent UC:   none SVE:   Dilation: 3 Effacement (%): Thick Station: -3 Exam by:: Felton Clinton. RN  Labs: Lab Results  Component Value Date   WBC 8.1 02/22/2016   HGB 10.3* 02/22/2016   HCT 31.2* 02/22/2016   MCV 76.7* 02/22/2016   PLT 200 02/22/2016    Assessment / Plan: IOL yet to be in labor  Labor: yet to be in labor Preeclampsia:  intake and ouput balanced and labs stable Fetal Wellbeing:  Category I Pain Control:  Labor support without medications I/D:  n/a Anticipated MOD:  NSVD  [name] 02/25/2016, 5:29 AM

## 2016-02-25 NOTE — Progress Notes (Signed)
Patient ID: [name] [name], female   DOB: 1986/10/10, 30 y.o.   MRN: [id number]  Patient having recurrent late decelerations with increased pitocin.  Cervix unchanged:  Dilation: 4 Effacement (%): 30 Cervical Position: Middle Station: -3, -2 Presentation: Vertex Exam by:: D Jasso, RN  Has already had a break from pitocin after prolonged deceleration.  The risks of cesarean section discussed with the patient included but were not limited to: bleeding which may require transfusion or reoperation; infection which may require antibiotics; injury to bowel, bladder, ureters or other surrounding organs; injury to the fetus; need for additional procedures including hysterectomy in the event of a life-threatening hemorrhage; placental abnormalities wth subsequent pregnancies, incisional problems, thromboembolic phenomenon and other postoperative/anesthesia complications. The patient concurred with the proposed plan, giving informed written consent for the procedure.   Patient has been NPO since this morning, she will remain NPO for procedure. Anesthesia and OR aware.  Preoperative prophylactic Ancef ordered on call to the OR.  To OR when ready.  Truett Mainland, DO 02/25/2016 10:45 PM

## 2016-02-25 NOTE — Anesthesia Procedure Notes (Addendum)
Epidural Patient location during procedure: OB Start time: 02/25/2016 2:03 PM End time: 02/25/2016 2:18 PM  Staffing Anesthesiologist: Duane Boston Performed by: anesthesiologist   Preanesthetic Checklist Completed: patient identified, site marked, surgical consent, pre-op evaluation, timeout performed, IV checked, risks and benefits discussed and monitors and equipment checked  Epidural Patient position: sitting Prep: site prepped and draped and DuraPrep Patient monitoring: continuous pulse ox and blood pressure Approach: midline Location: L3-L4 Injection technique: LOR air  Needle:  Needle type: Tuohy  Needle gauge: 17 G Needle length: 9 cm and 9 Needle insertion depth: 7 cm Catheter type: closed end flexible Catheter size: 19 Gauge Catheter at skin depth: 12 cm Test dose: negative  Assessment Events: blood not aspirated, injection not painful, no injection resistance, negative IV test and no paresthesia  Additional Notes Patient identified. Risks and benefits discussed including failed block, incomplete  Pain control, post dural puncture headache, nerve damage, paralysis, blood pressure Changes, nausea, vomiting, reactions to medications-both toxic and allergic and post Partum back pain. All questions were answered. Patient expressed understanding and wished to proceed. Sterile technique was used throughout procedure. Epidural site was Dressed with sterile barrier dressing. No paresthesias, signs of intravascular injection Or signs of intrathecal spread were encountered.  Patient was more comfortable after the epidural was dosed. Please see RN's note for documentation of vital signs and FHR which are stable.

## 2016-02-25 NOTE — Anesthesia Rounding Note (Signed)
  CRNA Epidural Rounding Note  Patient: [name] [name], 30 y.o., female  Patient's current pain level: 0 Agreed upon pain management level: 5  Epidural intervention: No   Comments:   [facility] 02/25/2016

## 2016-02-25 NOTE — Anesthesia Preprocedure Evaluation (Signed)
Anesthesia Evaluation  Patient identified by MRN, date of birth, ID band Patient awake    Reviewed: Allergy & Precautions, NPO status , Patient's Chart, lab work & pertinent test results  Airway Mallampati: III  TM Distance: >3 FB Neck ROM: Full    Dental no notable dental hx. (+) Teeth Intact   Pulmonary neg pulmonary ROS,    Pulmonary exam normal breath sounds clear to auscultation       Cardiovascular hypertension, Normal cardiovascular exam Rhythm:Regular Rate:Normal     Neuro/Psych negative neurological ROS  negative psych ROS   GI/Hepatic negative GI ROS, Neg liver ROS,   Endo/Other  Morbid obesity  Renal/GU negative Renal ROS  negative genitourinary   Musculoskeletal negative musculoskeletal ROS (+)   Abdominal (+) + obese,   Peds  Hematology  (+) anemia ,   Anesthesia Other Findings   Reproductive/Obstetrics (+) Pregnancy                             Anesthesia Physical Anesthesia Plan  ASA: III and emergent  Anesthesia Plan: Epidural   Post-op Pain Management:    Induction:   Airway Management Planned: Natural Airway  Additional Equipment:   Intra-op Plan:   Post-operative Plan:   Informed Consent: I have reviewed the patients History and Physical, chart, labs and discussed the procedure including the risks, benefits and alternatives for the proposed anesthesia with the patient or authorized representative who has indicated his/her understanding and acceptance.     Plan Discussed with: Anesthesiologist, CRNA and Surgeon  Anesthesia Plan Comments:         Anesthesia Quick Evaluation

## 2016-02-26 ENCOUNTER — Encounter (HOSPITAL_COMMUNITY): Payer: Self-pay | Admitting: Family Medicine

## 2016-02-26 DIAGNOSIS — Z3A39 39 weeks gestation of pregnancy: Secondary | ICD-10-CM

## 2016-02-26 DIAGNOSIS — O1092 Unspecified pre-existing hypertension complicating childbirth: Secondary | ICD-10-CM

## 2016-02-26 LAB — CBC
HCT: 30.8 % — ABNORMAL LOW (ref 36.0–46.0)
Hemoglobin: 9.9 g/dL — ABNORMAL LOW (ref 12.0–15.0)
MCH: 25.1 pg — AB (ref 26.0–34.0)
MCHC: 32.1 g/dL (ref 30.0–36.0)
MCV: 78 fL (ref 78.0–100.0)
PLATELETS: 217 10*3/uL (ref 150–400)
RBC: 3.95 MIL/uL (ref 3.87–5.11)
RDW: 15.4 % (ref 11.5–15.5)
WBC: 14.1 10*3/uL — ABNORMAL HIGH (ref 4.0–10.5)

## 2016-02-26 MED ORDER — OXYCODONE HCL 5 MG PO TABS
10.0000 mg | ORAL_TABLET | ORAL | Status: DC | PRN
  Administered 2016-02-27: 10 mg via ORAL
  Filled 2016-02-26: qty 2

## 2016-02-26 MED ORDER — COCONUT OIL OIL: 1.0000 "application " | TOPICAL_OIL | Status: DC | PRN

## 2016-02-26 MED ORDER — LABETALOL HCL 5 MG/ML IV SOLN
INTRAVENOUS | Status: AC
  Administered 2016-02-26: 40 mg via INTRAVENOUS
  Filled 2016-02-26: qty 4

## 2016-02-26 MED ORDER — IBUPROFEN 600 MG PO TABS
600.0000 mg | ORAL_TABLET | Freq: Four times a day (QID) | ORAL | Status: DC | PRN
  Administered 2016-02-26 – 2016-02-28 (×8): 600 mg via ORAL
  Filled 2016-02-26 (×8): qty 1

## 2016-02-26 MED ORDER — ACETAMINOPHEN 325 MG PO TABS: 650.0000 mg | ORAL_TABLET | ORAL | Status: DC | PRN

## 2016-02-26 MED ORDER — HYDRALAZINE HCL 20 MG/ML IJ SOLN
10.0000 mg | Freq: Once | INTRAMUSCULAR | Status: AC | PRN
  Administered 2016-02-27: 10 mg via INTRAVENOUS
  Filled 2016-02-26: qty 1

## 2016-02-26 MED ORDER — LABETALOL HCL 5 MG/ML IV SOLN
20.0000 mg | INTRAVENOUS | Status: AC | PRN
  Administered 2016-02-26: 40 mg via INTRAVENOUS
  Administered 2016-02-26 – 2016-02-27 (×2): 20 mg via INTRAVENOUS
  Filled 2016-02-26: qty 4

## 2016-02-26 MED ORDER — SIMETHICONE 80 MG PO CHEW
80.0000 mg | CHEWABLE_TABLET | Freq: Three times a day (TID) | ORAL | Status: DC
Start: 2016-02-26 — End: 2016-02-29
  Administered 2016-02-26 – 2016-02-29 (×9): 80 mg via ORAL
  Filled 2016-02-26 (×9): qty 1

## 2016-02-26 MED ORDER — LABETALOL HCL 100 MG PO TABS: 200.0000 mg | ORAL_TABLET | Freq: Three times a day (TID) | ORAL | Status: DC

## 2016-02-26 MED ORDER — NALBUPHINE HCL 10 MG/ML IJ SOLN: 5.0000 mg | INTRAMUSCULAR | Status: DC | PRN

## 2016-02-26 MED ORDER — WITCH HAZEL-GLYCERIN EX PADS: 1.0000 "application " | MEDICATED_PAD | CUTANEOUS | Status: DC | PRN

## 2016-02-26 MED ORDER — LABETALOL HCL 200 MG PO TABS
400.0000 mg | ORAL_TABLET | Freq: Three times a day (TID) | ORAL | Status: DC
  Filled 2016-02-26: qty 2

## 2016-02-26 MED ORDER — MAGNESIUM SULFATE 50 % IJ SOLN
2.0000 g/h | INTRAVENOUS | Status: AC
  Administered 2016-02-26: 2 g/h via INTRAVENOUS
  Filled 2016-02-26 (×2): qty 80

## 2016-02-26 MED ORDER — LABETALOL HCL 5 MG/ML IV SOLN
INTRAVENOUS | Status: AC
  Filled 2016-02-26: qty 8

## 2016-02-26 MED ORDER — NALOXONE HCL 2 MG/2ML IJ SOSY
1.0000 ug/kg/h | PREFILLED_SYRINGE | INTRAVENOUS | Status: DC | PRN
  Filled 2016-02-26: qty 2

## 2016-02-26 MED ORDER — MAGNESIUM SULFATE BOLUS VIA INFUSION
6.0000 g | Freq: Once | INTRAVENOUS | Status: AC
  Administered 2016-02-26: 6 g via INTRAVENOUS
  Filled 2016-02-26: qty 500

## 2016-02-26 MED ORDER — DEXTROSE 5 % IV SOLN
3.0000 g | INTRAVENOUS | Status: DC | PRN
  Administered 2016-02-25: 3 g via INTRAVENOUS

## 2016-02-26 MED ORDER — MENTHOL 3 MG MT LOZG
1.0000 | LOZENGE | OROMUCOSAL | Status: DC | PRN
  Filled 2016-02-26: qty 9

## 2016-02-26 MED ORDER — OXYCODONE HCL 5 MG PO TABS
5.0000 mg | ORAL_TABLET | ORAL | Status: DC | PRN
  Administered 2016-02-27 – 2016-02-28 (×3): 5 mg via ORAL
  Filled 2016-02-26 (×3): qty 1

## 2016-02-26 MED ORDER — SIMETHICONE 80 MG PO CHEW
80.0000 mg | CHEWABLE_TABLET | ORAL | Status: DC
  Administered 2016-02-27 – 2016-02-28 (×2): 80 mg via ORAL
  Filled 2016-02-26 (×2): qty 1

## 2016-02-26 MED ORDER — NALBUPHINE HCL 10 MG/ML IJ SOLN: 5.0000 mg | Freq: Once | INTRAMUSCULAR | Status: DC | PRN

## 2016-02-26 MED ORDER — NALOXONE HCL 0.4 MG/ML IJ SOLN: 0.4000 mg | INTRAMUSCULAR | Status: DC | PRN

## 2016-02-26 MED ORDER — KETOROLAC TROMETHAMINE 30 MG/ML IJ SOLN
30.0000 mg | Freq: Four times a day (QID) | INTRAMUSCULAR | Status: AC | PRN
  Administered 2016-02-26: 30 mg via INTRAMUSCULAR
  Filled 2016-02-26: qty 1

## 2016-02-26 MED ORDER — TETANUS-DIPHTH-ACELL PERTUSSIS 5-2.5-18.5 LF-MCG/0.5 IM SUSP
0.5000 mL | Freq: Once | INTRAMUSCULAR | Status: DC
  Filled 2016-02-26: qty 0.5

## 2016-02-26 MED ORDER — SIMETHICONE 80 MG PO CHEW: 80.0000 mg | CHEWABLE_TABLET | ORAL | Status: DC | PRN

## 2016-02-26 MED ORDER — ZOLPIDEM TARTRATE 5 MG PO TABS: 5.0000 mg | ORAL_TABLET | Freq: Every evening | ORAL | Status: DC | PRN

## 2016-02-26 MED ORDER — PRENATAL MULTIVITAMIN CH
1.0000 | ORAL_TABLET | Freq: Every day | ORAL | Status: DC
  Administered 2016-02-26 – 2016-02-29 (×4): 1 via ORAL
  Filled 2016-02-26 (×4): qty 1

## 2016-02-26 MED ORDER — KETOROLAC TROMETHAMINE 30 MG/ML IJ SOLN
INTRAMUSCULAR | Status: AC
  Filled 2016-02-26: qty 1

## 2016-02-26 MED ORDER — SENNOSIDES-DOCUSATE SODIUM 8.6-50 MG PO TABS
2.0000 | ORAL_TABLET | ORAL | Status: DC
  Administered 2016-02-27 – 2016-02-28 (×2): 2 via ORAL
  Filled 2016-02-26 (×2): qty 2

## 2016-02-26 MED ORDER — DIPHENHYDRAMINE HCL 25 MG PO CAPS: 25.0000 mg | ORAL_CAPSULE | Freq: Four times a day (QID) | ORAL | Status: DC | PRN

## 2016-02-26 MED ORDER — OXYTOCIN 40 UNITS IN LACTATED RINGERS INFUSION - SIMPLE MED
2.5000 [IU]/h | INTRAVENOUS | Status: AC
  Administered 2016-02-26 (×2): 2.5 [IU]/h via INTRAVENOUS
  Filled 2016-02-26: qty 1000

## 2016-02-26 MED ORDER — HYDROCHLOROTHIAZIDE 25 MG PO TABS
25.0000 mg | ORAL_TABLET | Freq: Every day | ORAL | Status: DC
  Administered 2016-02-26 – 2016-02-29 (×4): 25 mg via ORAL
  Filled 2016-02-26 (×5): qty 1

## 2016-02-26 MED ORDER — KETOROLAC TROMETHAMINE 30 MG/ML IJ SOLN
30.0000 mg | Freq: Four times a day (QID) | INTRAMUSCULAR | Status: AC | PRN
  Filled 2016-02-26: qty 1

## 2016-02-26 MED ORDER — SODIUM CHLORIDE 0.9% FLUSH: 3.0000 mL | INTRAVENOUS | Status: DC | PRN

## 2016-02-26 MED ORDER — DIPHENHYDRAMINE HCL 25 MG PO CAPS: 25.0000 mg | ORAL_CAPSULE | ORAL | Status: DC | PRN

## 2016-02-26 MED ORDER — DIBUCAINE 1 % RE OINT: 1.0000 "application " | TOPICAL_OINTMENT | RECTAL | Status: DC | PRN

## 2016-02-26 MED ORDER — LACTATED RINGERS IV SOLN
INTRAVENOUS | Status: DC
  Administered 2016-02-26: 01:00:00 via INTRAVENOUS

## 2016-02-26 MED ORDER — DIPHENHYDRAMINE HCL 50 MG/ML IJ SOLN
12.5000 mg | INTRAMUSCULAR | Status: DC | PRN
  Administered 2016-02-26 (×3): 12.5 mg via INTRAVENOUS
  Filled 2016-02-26 (×3): qty 1

## 2016-02-26 MED ORDER — ONDANSETRON HCL 4 MG/2ML IJ SOLN: 4.0000 mg | Freq: Three times a day (TID) | INTRAMUSCULAR | Status: DC | PRN

## 2016-02-26 MED ORDER — FENTANYL CITRATE (PF) 100 MCG/2ML IJ SOLN: 25.0000 ug | INTRAMUSCULAR | Status: DC | PRN

## 2016-02-26 MED ORDER — MEPERIDINE HCL 25 MG/ML IJ SOLN: 6.2500 mg | INTRAMUSCULAR | Status: DC | PRN

## 2016-02-26 NOTE — Anesthesia Postprocedure Evaluation (Signed)
Anesthesia Post Note  Patient: [name] [name]  Procedure(s) Performed: Procedure(s) (LRB): CESAREAN SECTION (N/A)  Patient location during evaluation: PACU Anesthesia Type: General Level of consciousness: awake and alert and oriented Pain management: pain level controlled Vital Signs Assessment: post-procedure vital signs reviewed and stable Respiratory status: spontaneous breathing, nonlabored ventilation and respiratory function stable Cardiovascular status: blood pressure returned to baseline and stable Postop Assessment: no signs of nausea or vomiting, patient able to bend at knees, epidural receding, no backache and no headache Anesthetic complications: no     Last Vitals:  Filed Vitals:   02/26/16 0046 02/26/16 0100  BP:  179/116  Pulse: 93 93  Temp:  36.8 C  Resp: 17 19    Last Pain:  Filed Vitals:   02/26/16 0106  PainSc: 6    Pain Goal: Patients Stated Pain Goal: 5 (02/25/16 0818)               Gauge Winski A.

## 2016-02-26 NOTE — Addendum Note (Signed)
Addendum  created 02/26/16 0841 by Brock Ra, CRNA   Modules edited: Charges VN, Clinical Notes   Clinical Notes:  File: [id number]

## 2016-02-26 NOTE — Lactation Note (Signed)
This note was copied from a baby's chart. Lactation Consultation Note  Patient Name: [name] [name] [id number] Date: 02/26/2016 Reason for consult: Initial assessment;Difficult latch  Baby 10 hours old. Mom is on Magnesium Sulfate and baby. Parents report that baby has spit up several times and seems too sleepy to nurse. Baby spit up twice and had several large, wet-sounding burps while this LC in the room. Discussed with parents that baby acting like he swallowed a lot of fluid just prior to delivery. Assisted mom with hand expression--mom able to obtain a few drops from left breast which were given to baby with finger and spoon. Mom reports that she has pumped once already without any EBM present. Enc mom to keep using DEBP at least every 3 hours for stimulation of breasts since baby not nursing well. Placed baby on FOB's should and enc FOB to keep baby there to let fluid settle. Enc mom to keep pumping and use finger, spoon or syringe to feed baby.   Plan is for mom to keep putting baby to breast with cues, supplement with EBM/formula according to supplementation guidelines--which were given with review, and then post-pump with DEBP after each feeding. Parents state that they are comfortable with feeding plan. Discussed assessment and interventions with patient's bedside nurse,  Maternal Data Has patient been taught Hand Expression?: Yes Does the patient have breastfeeding experience prior to this delivery?: No  Feeding Feeding Type: Breast Fed Length of feed: 0 min  LATCH Score/Interventions Latch: Too sleepy or reluctant, no latch achieved, no sucking elicited. Intervention(s): Skin to skin;Waking techniques Intervention(s): Adjust position;Breast massage  Audible Swallowing: None Intervention(s):  (attempted-did bilateral breast massage)  Type of Nipple: Everted at rest and after stimulation  Comfort (Breast/Nipple): Soft / non-tender     Hold (Positioning): Full assist, staff  holds infant at breast  LATCH Score: 4  Lactation Tools Discussed/Used Pump Review: Setup, frequency, and cleaning;Milk Storage Initiated by:: Bedside RN Date initiated:: 02/26/16   Consult Status Consult Status: Follow-up Date: 02/27/16 Follow-up type: In-patient    [name] [name] 02/26/2016, 5:25 PM

## 2016-02-26 NOTE — Transfer of Care (Signed)
Immediate Anesthesia Transfer of Care Note  Patient: [name] [name]  Procedure(s) Performed: Procedure(s): CESAREAN SECTION (N/A)  Patient Location: PACU  Anesthesia Type:Epidural  Level of Consciousness: awake and sedated  Airway & Oxygen Therapy: Patient Spontanous Breathing  Post-op Assessment: Report given to RN and Post -op Vital signs reviewed and stable  Post vital signs: Reviewed and stable  Last Vitals:  Filed Vitals:   02/26/16 0130 02/26/16 0145  BP: 177/114 154/97  Pulse: 89 91  Temp:  37.2 C  Resp: 12 17    Last Pain:  Filed Vitals:   02/26/16 0202  PainSc: Asleep      Patients Stated Pain Goal: 5 ([id number])  Complications: No apparent anesthesia complications

## 2016-02-26 NOTE — Op Note (Signed)
Cesarean Section Operative Report  [name] [name]  02/22/2016 - 02/26/2016  Indications: Fetal Distress   Pre-operative Diagnosis: fetal indications  Post-operative Diagnosis: Same   Surgeon: Surgeon(s) and Role:    * Truett Mainland, DO - Primary    * Gwynne Edinger, MD - Assisting   Attending Attestation: I was present and scrubbed for the entire procedure.   Assistants: none  Anesthesia: spinal, nesacaine 2% 30 ml drizzled into abdomen prior to fascial closure   Estimated Blood Loss: 600 ml  Total IV Fluids: 1100 ml LR  Urine Output:: 125 ml clear yellow urine  Specimens: none  Findings: Viable female infant in cephalic presentation; Apgars 9/9; weight 3352 g; arterial cord pH not obtained; clear amniotic fluid; intact placenta with three vessel cord; normal uterus, fallopian tubes and ovaries bilaterally.  Baby condition / location:  Couplet care / Skin to Skin   Complications: no complications  Indications: [name] Jimmye Norman is a 30 y.o. G1P0 with an IUP [redacted]w[redacted]d presenting for iol for chtn, later developed preE. 3-day induction, fetal intolerance. See progress note for further discussion of indications..  The risks, benefits, complications, treatment options, and expected outcomes were discussed with the patient . The patient concurred with the proposed plan, giving informed consent. identified as [name] [name] and the procedure verified as C-Section Delivery.  Procedure Details:  The patient was taken back to the operative suite where spinal anesthesia was placed.  A time out was held and the above information confirmed.   After induction of anesthesia, the patient was draped and prepped in the usual sterile manner and placed in a dorsal supine position with a leftward tilt. A Pfannenstiel incision was made and carried down through the subcutaneous tissue to the fascia. Fascial incision was made and sharply extended transversely. The fascia was separated from the  underlying rectus tissue superiorly and inferiorly. The peritoneum was identified and bluntly entered and extended longitudinally. Alexis retractor was placed. A low transverse uterine incision was made and extended bluntly. Delivered from cephalic presentation was a viable infant with Apgars and weight as above. The umbilical cord was clamped and cut cord blood was obtained for evaluation. Cord ph was not sent. The placenta was removed Intact and appeared normal. The uterine outline, tubes and ovaries appeared normal. The uterine incision was closed with running locked sutures of 0Vicryl with an imbricating layer of the same.   Hemostasis was observed. The peritoneum was closed with 2-0 vicryl. The rectus muscles were examined and hemostasis observed. The fascia was then reapproximated with running sutures of 0Vicryl.  The subcuticular closure was performed using 2-0plain gut. The skin was closed with 4-0Vicryl.   Instrument, sponge, and needle counts were correct prior the abdominal closure and were correct at the conclusion of the case.     Disposition: PACU - hemodynamically stable.   Maternal Condition: stable       Signed: Ennis Forts 02/26/2016 12:26 AM

## 2016-02-26 NOTE — Anesthesia Postprocedure Evaluation (Signed)
Anesthesia Post Note  Patient: [name] [name]  Procedure(s) Performed: Procedure(s) (LRB): CESAREAN SECTION (N/A)  Patient location during evaluation: Antenatal Anesthesia Type: Spinal and Epidural Level of consciousness: oriented and awake and alert Pain management: pain level controlled Vital Signs Assessment: post-procedure vital signs reviewed and stable Respiratory status: spontaneous breathing, respiratory function stable and patient connected to nasal cannula oxygen Cardiovascular status: blood pressure returned to baseline and stable Postop Assessment: no headache and no backache Anesthetic complications: no Comments: Pain level 5, educated patient regarding pain management     Last Vitals:  Filed Vitals:   02/26/16 0600 02/26/16 0700  BP: 134/84 127/77  Pulse: 87 85  Temp:    Resp: 16 16    Last Pain:  Filed Vitals:   02/26/16 0713  PainSc: 6    Pain Goal: Patients Stated Pain Goal: 5 (02/25/16 0818)               Devine Dant

## 2016-02-26 NOTE — Addendum Note (Signed)
Addendum  created 02/26/16 [id number] by Brock Ra, CRNA   Modules edited: Charges VN

## 2016-02-26 NOTE — Progress Notes (Signed)
POSTPARTUM PROGRESS NOTE  Post Partum Day 1 Subjective:  [name] [name] is a 30 y.o. G1P1001 [redacted]w[redacted]d s/p pltcs.  No acute events overnight.  Pt denies problems with ambulating, voiding or po intake.  She denies nausea or vomiting.  Pain is well controlled.  She has had flatus. She has not had bowel movement.  Lochia Small.   Objective: Blood pressure 127/77, pulse 85, temperature 98.4 F (36.9 C), temperature source Oral, resp. rate 16, height 5\' 3"  (1.6 m), weight 253 lb (114.76 kg), last menstrual period 05/25/2015, SpO2 94 %, unknown if currently breastfeeding.  Physical Exam:  General: alert, cooperative and no distress Lochia:normal flow Chest: CTAB Heart: RRR no m/r/g Abdomen: +BS, soft, nontender, incision c/d/i Uterine Fundus: firm,  DVT Evaluation: No calf swelling or tenderness Extremities: trace edema   Recent Labs  02/25/16 1321 02/26/16 0606  HGB 10.6* 9.9*  HCT 32.8* 30.8*    Assessment/Plan:  ASSESSMENT: [name] [name] is a 30 y.o. G1P1001 [redacted]w[redacted]d s/p pltcs. preE w/ severe features on mg for 24 hrs pp. BP wnl. H drop to 9.9. Stop labetalol, continue hctz 25    LOS: 4 days   Desma Maxim 02/26/2016, 7:56 AM

## 2016-02-27 LAB — BIRTH TISSUE RECOVERY COLLECTION (PLACENTA DONATION)

## 2016-02-27 MED ORDER — LABETALOL HCL 5 MG/ML IV SOLN
20.0000 mg | INTRAVENOUS | Status: AC | PRN
  Administered 2016-02-27: 40 mg via INTRAVENOUS
  Administered 2016-02-27: 80 mg via INTRAVENOUS
  Administered 2016-02-28: 40 mg via INTRAVENOUS
  Filled 2016-02-27: qty 16
  Filled 2016-02-27: qty 8
  Filled 2016-02-27: qty 4
  Filled 2016-02-27: qty 16
  Filled 2016-02-27 (×2): qty 4

## 2016-02-27 MED ORDER — LABETALOL HCL 5 MG/ML IV SOLN
INTRAVENOUS | Status: AC
  Filled 2016-02-27: qty 8

## 2016-02-27 MED ORDER — LISINOPRIL 10 MG PO TABS
10.0000 mg | ORAL_TABLET | Freq: Every day | ORAL | Status: DC
  Administered 2016-02-27 – 2016-02-29 (×3): 10 mg via ORAL
  Filled 2016-02-27 (×4): qty 1

## 2016-02-27 MED ORDER — HYDRALAZINE HCL 20 MG/ML IJ SOLN
10.0000 mg | Freq: Once | INTRAMUSCULAR | Status: AC | PRN
  Administered 2016-02-27: 10 mg via INTRAVENOUS
  Filled 2016-02-27: qty 1

## 2016-02-27 MED ORDER — LABETALOL HCL 5 MG/ML IV SOLN
20.0000 mg | INTRAVENOUS | Status: AC | PRN
  Administered 2016-02-27: 80 mg via INTRAVENOUS
  Administered 2016-02-28 (×2): 20 mg via INTRAVENOUS

## 2016-02-27 NOTE — Progress Notes (Signed)
Honey comb dressing changed this morning with night shift RN because over half of the dressing was blood stained and pull up from skin on left side.  Steri strips were saturated with blood and not sticking to skin, they were removed.  Sterile gloves and procedure was used while changing the dressing.

## 2016-02-27 NOTE — Anesthesia Postprocedure Evaluation (Signed)
Anesthesia Post Note  Patient: [name] [name]  Procedure(s) Performed: Procedure(s) (LRB): CESAREAN SECTION (N/A)  Patient location during evaluation: Antenatal Anesthesia Type: Epidural Level of consciousness: awake, awake and alert, oriented and patient cooperative Pain management: pain level controlled Vital Signs Assessment: post-procedure vital signs reviewed and stable Respiratory status: spontaneous breathing, nonlabored ventilation and respiratory function stable Cardiovascular status: stable Postop Assessment: no headache, no backache, patient able to bend at knees and no signs of nausea or vomiting Anesthetic complications: no     Last Vitals:  Filed Vitals:   02/27/16 0659 02/27/16 0808  BP:  161/91  Pulse:  94  Temp:  37 C  Resp: 20 18    Last Pain:  Filed Vitals:   02/27/16 0824  PainSc: 8    Pain Goal: Patients Stated Pain Goal: 2 (02/26/16 1945)               [name] L

## 2016-02-27 NOTE — Addendum Note (Signed)
Addendum  created 02/27/16 [id number] by Raenette Rover, CRNA   Modules edited: Clinical Notes   Clinical Notes:  File: [id number]

## 2016-02-27 NOTE — Progress Notes (Signed)
POSTPARTUM PROGRESS NOTE  POD#2  Subjective:  [name] [name] is a 30 y.o. G1P1001 [redacted]w[redacted]d s/p pltcs.  No acute events overnight.  Pt denies problems with ambulating, voiding or po intake.  She denies nausea or vomiting.  Pain is well controlled.  She has had flatus. She has not had bowel movement.  Lochia Small.   Objective: Blood pressure 137/82, pulse 96, temperature 98.1 F (36.7 C), temperature source Oral, resp. rate 20, height 5\' 3"  (1.6 m), weight 253 lb (114.76 kg), last menstrual period 05/25/2015, SpO2 98 %, unknown if currently breastfeeding.  Physical Exam:  General: alert, cooperative and no distress Lochia:normal flow Chest: CTAB Heart: RRR no m/r/g Abdomen: +BS, soft, nontender, incision c/d/i Uterine Fundus: firm,  DVT Evaluation: No calf swelling or tenderness Extremities: trace edema   Recent Labs  02/25/16 1321 02/26/16 0606  HGB 10.6* 9.9*  HCT 32.8* 30.8*    Assessment/Plan:  ASSESSMENT: [name] [name] is a 30 y.o. G1P1001 [redacted]w[redacted]d s/p pltcs. Routine care  #PreE w/ severe features:  on mg for 24 hrs pp. BP wnl. H drop to 9.9. Stop labetalol, continue hctz 25    LOS: 5 days   Caren Macadam 02/27/2016, 7:19 AM

## 2016-02-27 NOTE — Lactation Note (Signed)
This note was copied from a baby's chart. Lactation Consultation Note  Follow up visit made.  [name] states that she has been attempting to latch baby to breast but baby wont latch.  Parents have been cup feeding baby and state he takes formula easily from cup.  Mom is pumping but not obtaining milk.  Assisted with positioning baby in cross cradle hold.   Demonstrated to parents how to compress breast for easier latch.  Baby latched with first attempt and nursed actively.  Instructed to put to breast with cues, cup feed supplement after breast and post pump x 15 minutes.  Encouraged to call for assist prn. Patient Name: [name] [name] [id number] Date: 02/27/2016 Reason for consult: Follow-up assessment;Difficult latch   Maternal Data    Feeding Feeding Type: Breast Fed Length of feed: 15 min  LATCH Score/Interventions Latch: Grasps breast easily, tongue down, lips flanged, rhythmical sucking. Intervention(s): Skin to skin;Teach feeding cues;Waking techniques Intervention(s): Breast compression;Breast massage;Assist with latch;Adjust position  Audible Swallowing: A few with stimulation Intervention(s): Hand expression;Alternate breast massage;Skin to skin  Type of Nipple: Everted at rest and after stimulation  Comfort (Breast/Nipple): Soft / non-tender     Hold (Positioning): Assistance needed to correctly position infant at breast and maintain latch. Intervention(s): Breastfeeding basics reviewed;Support Pillows;Skin to skin  LATCH Score: 8  Lactation Tools Discussed/Used     Consult Status      Ave Filter 02/27/2016, 1:22 PM

## 2016-02-28 MED ORDER — HYDRALAZINE HCL 20 MG/ML IJ SOLN
INTRAMUSCULAR | Status: AC
Start: 2016-02-28 — End: 2016-02-28
  Filled 2016-02-28: qty 1

## 2016-02-28 MED ORDER — HYDRALAZINE HCL 20 MG/ML IJ SOLN
10.0000 mg | Freq: Once | INTRAMUSCULAR | Status: AC
  Administered 2016-02-28: 10 mg via INTRAVENOUS

## 2016-02-28 NOTE — Progress Notes (Signed)
Honey comb dressing this morning was rolled up and pulled away from incision.  I replaced the dressing using sterile gloves and technique.

## 2016-02-28 NOTE — Progress Notes (Signed)
Post Partum Day 3 Subjective:  [name] [name] is a 30 y.o. G1P1001 [redacted]w[redacted]d s/p pLTCS for fetal indications.  Overnight continued to have mild headaches which have resolved this morning.  Pt denies problems with ambulating, voiding or po intake.  She denies nausea or vomiting.  Pain is well controlled, but she mentions mild "burning" around incision site.  She has had flatus.  Lochia Minimal. Breast feeding went much better yesterday.  Objective: Blood pressure 150/79, pulse 81, temperature 98.8 F (37.1 C), temperature source Oral, resp. rate 18, height 5\' 3"  (1.6 m), weight 115.44 kg (254 lb 8 oz), last menstrual period 05/25/2015, SpO2 98 %, unknown if currently breastfeeding.  Physical Exam:  General: alert, cooperative and no distress Chest: normal WOB Heart: Regular rate/rhythm, no murmus Abdomen: +BS, soft, incision lower abdomen c/d/i DVT Evaluation: No evidence of DVT seen on physical exam. Extremities: trace edema  Assessment/Plan:  ASSESSMENT: [name] [name] is a 30 y.o. G1P1001 [redacted]w[redacted]d s/p pLTCS. #Uncontrolled HTN - Continuing scheduled Lisinopril 10mg  daily  - Continuing scheduled HCTZ 25mg  daily - PRN Labetalol 20-80mg  IM - PRN Hydralazine 10mg  IV - Plan for d/c following 24hrs stable vitals  Continue Breastfeeding. Continue routine PP care Breastfeeding support PRN  LOS: 6 days   Damita Lack  [facility] fellow attestation Post Partum Day 3/POD#3 I have seen and examined this patient and agree with above documentation in the medical student's note.   [name] [name] is a 30 y.o. G1P1001 s/p pLTCS.  Pt denies problems with ambulating, voiding or po intake. Pain is well controlled.  Denies HA, blurry vision, RUQ pain.   PE:  BP 149/90 mmHg  Pulse 94  Temp(Src) 98.7 F (37.1 C) (Oral)  Resp 17  Ht 5\' 3"  (1.6 m)  Wt 254 lb 8 oz (115.44 kg)  BMI 45.09 kg/m2  SpO2 98%  LMP 05/25/2015  Breastfeeding? Unknown Gen: well appearing Heart: reg rate Lungs: normal  WOB Fundus firm Ext: soft, no pain, no edema  Plan for discharge: POD#4 given need to better control HTN  #HTN:  - Continue HCTZ and lisinopril - Monitor BP - Desires 24 hours without the need to IV antihypertensives  #PP Care -routine care  Caren Macadam, MD 8:58 PM

## 2016-02-28 NOTE — Lactation Note (Signed)
This note was copied from a baby's chart. Lactation Consultation Note  Patient Name: [name] [name] [id number] Date: 02/28/2016 Reason for consult: Follow-up assessment;Difficult latch;Hyperbilirubinemia RN assisting Mom with latch. RN reports baby is humping his tongue, thrusting making it difficult for baby to sustain latch. LC assisted Mom with latch, baby can grasp breast well using breast compression. Colostrum is present with hand expression. Baby does pull off after few suckles, needing to be re-latched. Demonstrated suck training with Mom using her finger to help baby keep his tongue down. Baby does have some disorganization to suck, tongue humps at back, chewing noted w/suckling at breast and on finger.  Mom is pumping and received approx 2 ml of colostrum with last pumping. Demonstrated how to give this back to baby by finger feeding using curved tipped syringe. Demonstrated how to finger feed using 5 fr feeding tube w/formula. Mom reports she is not comfortable with finger feeding and prefers to continue to cup feed. Discussed using bottle w/slow flow nipple to supplement to perform suck training while giving supplement but Mom reports not wanting to use bottle yet.  Baby did take formula well with cup feeding. Can bring his tongue out past lower lip.  Worked with Mom with positioning to keep 1 bili blanket under baby while nursing. Feeding plan discussed with Mom: Continue to BF with feeding ques waking baby as needed if he becomes sleepy due to jaundice.  Perform suck training before trying to latch to help baby organize suck and keep tongue down. Position where baby can keep 1 bili blanket under him with nursing. Limit time at breast to 15-30 minutes, alternate breast each feeding. Supplement each feeding with EBM/formula - 20-25 ml today increasing to satisfy baby. Post pump for 15 minutes to encourage milk production and to have EBM to supplement. Goal is to have feeding/pumping done  within 1 hour or less.  Mom reports she can work with this plan. Encouraged to call for assist as needed.  Maternal Data    Feeding Feeding Type: Breast Fed Length of feed: 10 min (off/on)  LATCH Score/Interventions Latch: Repeated attempts needed to sustain latch, nipple held in mouth throughout feeding, stimulation needed to elicit sucking reflex. Intervention(s): Skin to skin;Teach feeding cues Intervention(s): Adjust position;Assist with latch;Breast massage;Breast compression  Audible Swallowing: A few with stimulation Intervention(s): Skin to skin;Hand expression Intervention(s): Skin to skin;Hand expression  Type of Nipple: Everted at rest and after stimulation  Comfort (Breast/Nipple): Soft / non-tender     Hold (Positioning): Assistance needed to correctly position infant at breast and maintain latch.  LATCH Score: 7  Lactation Tools Discussed/Used Tools: Pump;Feeding cup;32F feeding tube / Syringe Breast pump type: Double-Electric Breast Pump   Consult Status Consult Status: Follow-up Date: 02/29/16 Follow-up type: In-patient    Katrine Coho 02/28/2016, 11:45 AM

## 2016-02-29 MED ORDER — LISINOPRIL 10 MG PO TABS: 10.0000 mg | ORAL_TABLET | Freq: Every day | ORAL | Status: DC

## 2016-02-29 MED ORDER — OXYCODONE-ACETAMINOPHEN 5-325 MG PO TABS: 1.0000 | ORAL_TABLET | Freq: Four times a day (QID) | ORAL | Status: DC | PRN

## 2016-02-29 MED ORDER — IBUPROFEN 600 MG PO TABS: 600.0000 mg | ORAL_TABLET | Freq: Four times a day (QID) | ORAL | Status: DC | PRN

## 2016-02-29 MED ORDER — HYDROCHLOROTHIAZIDE 25 MG PO TABS: 25.0000 mg | ORAL_TABLET | Freq: Every day | ORAL | Status: DC

## 2016-02-29 NOTE — Discharge Summary (Signed)
OB Discharge Summary     Patient Name: [name] [name] DOB: Mar 25, 1986 MRN: [id number]  Date of admission: 02/22/2016 Delivering MD: Laurey Arrow [place]   Date of discharge: 02/29/2016  Admitting diagnosis: INDUCTION Intrauterine pregnancy: [redacted]w[redacted]d     Secondary diagnosis:  Principal Problem:   HTN in pregnancy, chronic Active Problems:   Supervision of high-risk pregnancy   Obesity affecting pregnancy, antepartum   Abnormal quad screen   Group B Streptococcus carrier, +RV culture, currently pregnant  Additional problems: none     Discharge diagnosis: CHTN with superimposed preeclampsia                                                                                                Post partum procedures:none  Complications: None  Hospital course:  Induction of Labor With Cesarean Section  30 y.o. yo G1P1001 at [redacted]w[redacted]d was admitted to the hospital 02/22/2016 for induction of labor. Patient had a labor course significant for failed IOL and fetal intolerance of labor. The patient went for cesarean section due to Non-Reassuring FHR, and delivered a Viable infant,@BABYSUPPRESS (DBLINK,ept,110,,1,,) Membrane Rupture Time/Date: )12:13 PM ,02/25/2016   @Details  of operation can be found in separate operative Note.  Patient had an uncomplicated postpartum course. She is ambulating, tolerating a regular diet, passing flatus, and urinating well.  Patient is discharged home in stable condition on 02/29/2016.    Pt had uncontrolled BP's post op. She was initially on Magnesium sulfate. She continued to have elevated BP's which were subsequently controlled with lisinopril and HCTZ.  She denies complaints at the time of discharge.                               Physical exam  Filed Vitals:   02/28/16 2100 02/28/16 2102 02/28/16 2300 02/29/16 0630  BP: 159/83 159/83 148/89 161/95  Pulse: 81 81 89 97  Temp: 99 F (37.2 C)  98.9 F (37.2 C) 99 F (37.2 C)  TempSrc: Oral  Oral Oral  Resp: 16  20 20    Height:      Weight:      SpO2:       General: alert, cooperative and no distress Lochia: appropriate Uterine Fundus: firm Incision: Healing well with no significant drainage, Dressing is clean, dry, and intact DVT Evaluation: No evidence of DVT seen on physical exam. Labs: Lab Results  Component Value Date   WBC 14.1* 02/26/2016   HGB 9.9* 02/26/2016   HCT 30.8* 02/26/2016   MCV 78.0 02/26/2016   PLT 217 02/26/2016   CMP Latest Ref Rng 02/22/2016  Glucose 65 - 99 mg/dL 99  BUN 6 - 20 mg/dL 12  Creatinine 0.44 - 1.00 mg/dL 0.56  Sodium 135 - 145 mmol/L 137  Potassium 3.5 - 5.1 mmol/L 3.7  Chloride 101 - 111 mmol/L 108  CO2 22 - 32 mmol/L 19(L)  Calcium 8.9 - 10.3 mg/dL 8.6(L)  Total Protein 6.5 - 8.1 g/dL 5.8(L)  Total Bilirubin 0.3 - 1.2 mg/dL 0.2(L)  Alkaline Phos 38 - 126 U/L 90  AST  15 - 41 U/L 16  ALT 14 - 54 U/L 10(L)    Discharge instruction: per After Visit Summary and "Baby and Me Booklet".  After visit meds:    Medication List    STOP taking these medications        acetaminophen 500 MG tablet  Commonly known as:  TYLENOL     labetalol 200 MG tablet  Commonly known as:  NORMODYNE     methyldopa 250 MG tablet  Commonly known as:  ALDOMET      TAKE these medications        [employer] PRENATAL GUMMY PO  Take 2 each by mouth daily.     hydrochlorothiazide 25 MG tablet  Commonly known as:  HYDRODIURIL  Take 1 tablet (25 mg total) by mouth daily.     ibuprofen 600 MG tablet  Commonly known as:  ADVIL,MOTRIN  Take 1 tablet (600 mg total) by mouth every 6 (six) hours as needed for mild pain.     lisinopril 10 MG tablet  Commonly known as:  PRINIVIL,ZESTRIL  Take 1 tablet (10 mg total) by mouth daily.     oxyCODONE-acetaminophen 5-325 MG tablet  Commonly known as:  PERCOCET/ROXICET  Take 1-2 tablets by mouth every 6 (six) hours as needed.     triamcinolone cream 0.1 %  Commonly known as:  KENALOG  Apply 1 application topically 2 (two) times daily.         Diet: low salt diet  Activity: Advance as tolerated. Pelvic rest for 6 weeks.   Outpatient follow up:2 weeks Follow up Appt:Future Appointments Date Time Provider [place]  04/03/2016 2:00 PM Luvenia Redden, PA-C [facility]   Follow up Visit:No Follow-up on file.  Postpartum contraception: Nexplanon  Newborn Data: Live born female  Birth Weight: 7 lb 6.2 oz (3352 g) APGAR: 9, 9  Baby Feeding: Breast Disposition:currently on Bili lights   02/29/2016 Lavonia Drafts, MD

## 2016-02-29 NOTE — Progress Notes (Signed)
Discharge instructions given, questions answered, copy given, signed by pt. Pt states understanding.

## 2016-02-29 NOTE — Discharge Instructions (Signed)
Levonorgestrel intrauterine device (IUD) What is this medicine? LEVONORGESTREL IUD (LEE voe nor jes trel) is a contraceptive (birth control) device. The device is placed inside the uterus by a healthcare professional. It is used to prevent pregnancy and can also be used to treat heavy bleeding that occurs during your period. Depending on the device, it can be used for 3 to 5 years. This medicine may be used for other purposes; ask your health care provider or pharmacist if you have questions. What should I tell my health care provider before I take this medicine? They need to know if you have any of these conditions: -abnormal Pap smear -cancer of the breast, uterus, or cervix -diabetes -endometritis -genital or pelvic infection now or in the past -have more than one sexual partner or your partner has more than one partner -heart disease -history of an ectopic or tubal pregnancy -immune system problems -IUD in place -liver disease or tumor -problems with blood clots or take blood-thinners -use intravenous drugs -uterus of unusual shape -vaginal bleeding that has not been explained -an unusual or allergic reaction to levonorgestrel, other hormones, silicone, or polyethylene, medicines, foods, dyes, or preservatives -pregnant or trying to get pregnant -breast-feeding How should I use this medicine? This device is placed inside the uterus by a health care professional. Talk to your pediatrician regarding the use of this medicine in children. Special care may be needed. Overdosage: If you think you have taken too much of this medicine contact a poison control center or emergency room at once. NOTE: This medicine is only for you. Do not share this medicine with others. What if I miss a dose? This does not apply. What may interact with this medicine? Do not take this medicine with any of the following medications: -amprenavir -bosentan -fosamprenavir This medicine may also interact with  the following medications: -aprepitant -barbiturate medicines for inducing sleep or treating seizures -bexarotene -griseofulvin -medicines to treat seizures like carbamazepine, ethotoin, felbamate, oxcarbazepine, phenytoin, topiramate -modafinil -pioglitazone -rifabutin -rifampin -rifapentine -some medicines to treat HIV infection like atazanavir, indinavir, lopinavir, nelfinavir, tipranavir, ritonavir -St. John's wort -warfarin This list may not describe all possible interactions. Give your health care provider a list of all the medicines, herbs, non-prescription drugs, or dietary supplements you use. Also tell them if you smoke, drink alcohol, or use illegal drugs. Some items may interact with your medicine. What should I watch for while using this medicine? Visit your doctor or health care professional for regular check ups. See your doctor if you or your partner has sexual contact with others, becomes HIV positive, or gets a sexual transmitted disease. This product does not protect you against HIV infection (AIDS) or other sexually transmitted diseases. You can check the placement of the IUD yourself by reaching up to the top of your vagina with clean fingers to feel the threads. Do not pull on the threads. It is a good habit to check placement after each menstrual period. Call your doctor right away if you feel more of the IUD than just the threads or if you cannot feel the threads at all. The IUD may come out by itself. You may become pregnant if the device comes out. If you notice that the IUD has come out use a backup birth control method like condoms and call your health care provider. Using tampons will not change the position of the IUD and are okay to use during your period. What side effects may I notice from receiving this medicine?  Side effects that you should report to your doctor or health care professional as soon as possible: -allergic reactions like skin rash, itching or  hives, swelling of the face, lips, or tongue -fever, flu-like symptoms -genital sores -high blood pressure -no menstrual period for 6 weeks during use -pain, swelling, warmth in the leg -pelvic pain or tenderness -severe or sudden headache -signs of pregnancy -stomach cramping -sudden shortness of breath -trouble with balance, talking, or walking -unusual vaginal bleeding, discharge -yellowing of the eyes or skin Side effects that usually do not require medical attention (report to your doctor or health care professional if they continue or are bothersome): -acne -breast pain -change in sex drive or performance -changes in weight -cramping, dizziness, or faintness while the device is being inserted -headache -irregular menstrual bleeding within first 3 to 6 months of use -nausea This list may not describe all possible side effects. Call your doctor for medical advice about side effects. You may report side effects to FDA at 1-800-FDA-1088. Where should I keep my medicine? This does not apply. NOTE: This sheet is a summary. It may not cover all possible information. If you have questions about this medicine, talk to your doctor, pharmacist, or health care provider.    2016, Elsevier/Gold Standard. (2011-10-30 13:54:04)  Cesarean Delivery, Care After Refer to this sheet in the next few weeks. These instructions provide you with information on caring for yourself after your procedure. Your health care provider may also give you specific instructions. Your treatment has been planned according to current medical practices, but problems sometimes occur. Call your health care provider if you have any problems or questions after you go home. HOME CARE INSTRUCTIONS  Only take over-the-counter or prescription medications as directed by your health care provider.  Do not drink alcohol, especially if you are breastfeeding or taking medication to relieve pain.  Do not chew or smoke  tobacco.  Continue to use good perineal care. Good perineal care includes:  Wiping your perineum from front to back.  Keeping your perineum clean.  Check your surgical cut (incision) daily for increased redness, drainage, swelling, or separation of skin.  Clean your incision gently with soap and water every day, and then pat it dry. If your health care provider says it is okay, leave the incision uncovered. Use a bandage (dressing) if the incision is draining fluid or appears irritated. If the adhesive strips across the incision do not fall off within 7 days, carefully peel them off.  Hug a pillow when coughing or sneezing until your incision is healed. This helps to relieve pain.  Do not use tampons or douche until your health care provider says it is okay.  Shower, wash your hair, and take tub baths as directed by your health care provider.  Wear a well-fitting bra that provides breast support.  Limit wearing support panties or control-top hose.  Drink enough fluids to keep your urine clear or pale yellow.  Eat high-fiber foods such as whole grain cereals and breads, brown rice, beans, and fresh fruits and vegetables every day. These foods may help prevent or relieve constipation.  Resume activities such as climbing stairs, driving, lifting, exercising, or traveling as directed by your health care provider.  Talk to your health care provider about resuming sexual activities. This is dependent upon your risk of infection, your rate of healing, and your comfort and desire to resume sexual activity.  Try to have someone help you with your household activities and your  newborn for at least a few days after you leave the hospital.  Rest as much as possible. Try to rest or take a nap when your newborn is sleeping.  Increase your activities gradually.  Keep all of your scheduled postpartum appointments. It is very important to keep your scheduled follow-up appointments. At these  appointments, your health care provider will be checking to make sure that you are healing physically and emotionally. SEEK MEDICAL CARE IF:   You are passing large clots from your vagina. Save any clots to show your health care provider.  You have a foul smelling discharge from your vagina.  You have trouble urinating.  You are urinating frequently.  You have pain when you urinate.  You have a change in your bowel movements.  You have increasing redness, pain, or swelling near your incision.  You have pus draining from your incision.  Your incision is separating.  You have painful, hard, or reddened breasts.  You have a severe headache.  You have blurred vision or see spots.  You feel sad or depressed.  You have thoughts of hurting yourself or your newborn.  You have questions about your care, the care of your newborn, or medications.  You are dizzy or light-headed.  You have a rash.  You have pain, redness, or swelling at the site of the removed intravenous access (IV) tube.  You have nausea or vomiting.  You stopped breastfeeding and have not had a menstrual period within 12 weeks of stopping.  You are not breastfeeding and have not had a menstrual period within 12 weeks of delivery.  You have a fever. SEEK IMMEDIATE MEDICAL CARE IF:  You have persistent pain.  You have chest pain.  You have shortness of breath.  You faint.  You have leg pain.  You have stomach pain.  Your vaginal bleeding saturates 2 or more sanitary pads in 1 hour. MAKE SURE YOU:   Understand these instructions.  Will watch your condition.  Will get help right away if you are not doing well or get worse.   This information is not intended to replace advice given to you by your health care provider. Make sure you discuss any questions you have with your health care provider.   Document Released: 06/21/2002 Document Revised: 10/20/2014 Document Reviewed: 05/26/2012 Elsevier  Interactive Patient Education [employer].

## 2016-02-29 NOTE — Lactation Note (Addendum)
This note was copied from a baby's chart. Lactation Consultation Note  Patient Name: [name] [name] Date: 02/29/2016 Reason for consult: Follow-up assessment;Difficult latch;Infant weight loss   Follow up with mom of 67 hour old infant in Antenatal. Infant under double phototherapy. Infant with 8 BF for 10-20 minutes, 4 cup feeds of formula of 4-20 cc, EBM x 4 via cup of 2-12 cc, 5 voids and 5 stools in last 24 hours. Infant with LATCH Scores 7 by bedside RN. Infant weight 6 lb 12.8 oz with 8% weight loss since birth, infant weight unchanged in the last 24 hours.   Mom with large pendulous breasts and everted nipples. She denies nipple pain and reports breasts are feeling fuller today. She reports infant cluster fed last night and  it often hard to get him latched, she is persistent and able to get him latched. She reports she pumped 4-5 times yesterday and he BF all night.   Infant was cueing to feed. Assisted mom in latching infant in football hold to right breast. He was difficult to latch and was noted to be tongue thrusting while trying to latch. He did not suckle well on gloved finger and tongue was noted to be humped in back of his mouth. He does extend tongue well over gumline. Once we did get him latched he was noted to have multiple swallows. Massaging breast with feeding increased swallows. Infant fell asleep easily at breast, discussed awakening techniques with mom and necessity to assist him to stay awake at the breast during feeding. Infant was cup fed formula post BF and tolerated it well. Mom reports they have been doing suck training prior to feedings, encouraged her to continue.   Mom has DEBP at home for use. She is a [facility] client and has an appointment scheduled for 5/26. Infant to be d/c home today on phototherapy. Infant with f/u ped appt tomorrow and Home health on Sunday. OP LC Appt made for 5/23 @ 9 am, appointment reminder given.   Reviewed engorgement  prevention/treatment with mom. Reviewed I/O with mom and advised maintaining feeding logs and take to ped visit and [name] Visit. Reviewed BF basics with mom. Mom was giving breathing space, assisted mom with positioning and supporting breasts with feeding.   Left my number for mom to call for next feeding.   Plan: Breast feed 8-12 x in 24 hours at first feeding cues Follow BF by supplementation of EBM/formula via cup, feed until infant is satisfied Pump for 15 minutes with DEBP   Maternal Data    Feeding Feeding Type: Breast Fed Length of feed: 10 min  LATCH Score/Interventions Latch: Repeated attempts needed to sustain latch, nipple held in mouth throughout feeding, stimulation needed to elicit sucking reflex. Intervention(s): Skin to skin;Teach feeding cues;Waking techniques Intervention(s): Adjust position;Assist with latch;Breast massage;Breast compression  Audible Swallowing: Spontaneous and intermittent Intervention(s): Hand expression;Skin to skin  Type of Nipple: Everted at rest and after stimulation  Comfort (Breast/Nipple): Soft / non-tender     Hold (Positioning): Assistance needed to correctly position infant at breast and maintain latch. Intervention(s): Breastfeeding basics reviewed;Support Pillows;Position options;Skin to skin  LATCH Score: 8  Lactation Tools Discussed/Used Tools: Pump;Feeding cup Breast pump type: Double-Electric Breast Pump WIC Program: Yes (Has appt on 5/26) Pump Review: Setup, frequency, and cleaning;Milk Storage   Consult Status Consult Status: Follow-up Date: 03/04/16 Follow-up type: Out-patient    [name] [name] 02/29/2016, 10:41 AM

## 2016-03-03 ENCOUNTER — Telehealth: Payer: Self-pay | Admitting: Family Medicine

## 2016-03-03 MED ORDER — LISINOPRIL 20 MG PO TABS: 20.0000 mg | ORAL_TABLET | Freq: Every day | ORAL | Status: DC

## 2016-03-03 NOTE — Telephone Encounter (Signed)
Called by [employer] nurse for elevated BP 158/100.  Pt taking lisinopril 10mg  and HCTZ 25mg  Daily.  Will increase lisinopril to 20mg  and [employer] nurse to recheck BP later this week.

## 2016-03-04 ENCOUNTER — Encounter (HOSPITAL_COMMUNITY): Payer: Medicaid Other

## 2016-03-11 ENCOUNTER — Ambulatory Visit (HOSPITAL_COMMUNITY): Admission: RE | Admit: 2016-03-11 | Payer: Medicaid Other | Source: Ambulatory Visit

## 2016-03-13 ENCOUNTER — Telehealth: Payer: Self-pay | Admitting: Obstetrics and Gynecology

## 2016-03-13 DIAGNOSIS — I10 Essential (primary) hypertension: Secondary | ICD-10-CM

## 2016-03-13 HISTORY — PX: OTHER SURGICAL HISTORY: SHX169

## 2016-03-13 MED ORDER — LABETALOL HCL 200 MG PO TABS
200.0000 mg | ORAL_TABLET | Freq: Two times a day (BID) | ORAL | Status: DC
Start: 2016-03-13 — End: 2016-10-25

## 2016-03-13 NOTE — Telephone Encounter (Addendum)
OB Note Called by BP check in the community setting. Pt BPs still in the 150s/100s-105. No s/s of pre-eclampsia. Pt on Lisinopril 40 hctz 25, both qday and taken at 10am.  Pt had been running at these for the past few weeks with her lisinopril increased to 40 last week per the BP check RN. Can't 100% tell from the notes why HCTZ and lisinopril instead of labetalol or procardia xl except she's now PP and she's Af. Am but I told her stop the hctz and lisinopril and start labetalol 200mg  po bid starting at her usual 10am anti-HTN med slot. Pt told to use BP wrist cuff, called if BPs elevated and will repeat BP check in the community in 1wk.   Durene Romans MD Attending Center for [employer] farm [employer])

## 2016-04-03 ENCOUNTER — Ambulatory Visit (INDEPENDENT_AMBULATORY_CARE_PROVIDER_SITE_OTHER): Payer: Medicaid Other | Admitting: Obstetrics and Gynecology

## 2016-04-03 ENCOUNTER — Encounter: Payer: Self-pay | Admitting: Obstetrics and Gynecology

## 2016-04-03 VITALS — BP 163/107 | HR 93 | Wt 228.6 lb

## 2016-04-03 DIAGNOSIS — Z30017 Encounter for initial prescription of implantable subdermal contraceptive: Secondary | ICD-10-CM | POA: Diagnosis not present

## 2016-04-03 DIAGNOSIS — Z3202 Encounter for pregnancy test, result negative: Secondary | ICD-10-CM | POA: Diagnosis present

## 2016-04-03 DIAGNOSIS — I160 Hypertensive urgency: Secondary | ICD-10-CM | POA: Insufficient documentation

## 2016-04-03 DIAGNOSIS — O0990 Supervision of high risk pregnancy, unspecified, unspecified trimester: Secondary | ICD-10-CM

## 2016-04-03 LAB — POCT PREGNANCY, URINE: PREG TEST UR: NEGATIVE

## 2016-04-03 MED ORDER — ETONOGESTREL 68 MG ~~LOC~~ IMPL
68.0000 mg | DRUG_IMPLANT | Freq: Once | SUBCUTANEOUS | Status: AC
  Administered 2016-04-03: 68 mg via SUBCUTANEOUS

## 2016-04-03 NOTE — Progress Notes (Signed)
Patient ID: [name] [name], female   DOB: 1986/03/29, 30 y.o.   MRN: [id number]   Postpartum Visit  Subjective:     [name] [name] is a 30 y.o. female who presents for a postpartum visit. Preg c/b BMI 40, cHTN, s/p PP Mg. She is s/p 5/16 pLTCS FITL. I have fully reviewed the prenatal and intrapartum course. No intercourse since birth, having occasional spotting but no menses, bottle feeding.  Peripartum course c/b HTN control. She states that she is currently on labetalol 200mg  bid, and she had been on hctz and lisinopril b/c she states that was what she was on pre-pregnancy. She thinks she went to her PCP 1-2 years ago and that is who was managing it  She denies any HA, visual s/s, chest pain, SOB  Pap negative 2016  Review of Systems Pertinent items are noted in HPI.   Objective:    BP 163/107 mmHg  Pulse 93  Wt 228 lb 9.6 oz (103.692 kg)  Breastfeeding? No  Rpt BP 180s/120s NAD No MRGs, normal s1 and s2 CTAB Well healed low transverse incision, nttp, nd, obese  See procedure note for nexplanon insertion Assessment:   Hypertensive urgency S/p nexplanon insertion Patient currently stable  Plan:   No s/s of pre-eclampsia and likely just poorly controlled cHTN, but given BPs, I told her to go to [facility] for evaluation.  Durene Romans MD Attending Center for [employer] farm manager)

## 2016-04-03 NOTE — Progress Notes (Addendum)
Patient ID: [name] [name], female   DOB: [birth year], 30 y.o.   MRN: [id number]   Postpartum Visit  Subjective:     [name] [name] is a 30 y.o. female who presents for a postpartum visit. Preg c/b BMI 40, cHTN, s/p PP Mg. She is s/p 5/16 pLTCS FITL. I have fully reviewed the prenatal and intrapartum course. No intercourse since birth, having occasional spotting but no menses, bottle feeding.  Peripartum course c/b HTN control. She states that she is currently on labetalol 200mg  bid, and she had been on hctz and lisinopril b/c she states that was what she was on pre-pregnancy. She thinks she went to her PCP 1-2 years ago and that is who was managing it  She denies any HA, visual s/s, chest pain, SOB  Pap negative 2016  Review of Systems Pertinent items are noted in HPI.   Objective:    BP 163/107 mmHg  Pulse 93  Wt 228 lb 9.6 oz (103.692 kg)  Breastfeeding? No  Rpt BP 180s/120s NAD No MRGs, normal s1 and s2 CTAB Well healed low transverse incision, nttp, nd, obese  See procedure note for nexplanon insertion Assessment:   Hypertensive urgency S/p nexplanon insertion Patient currently stable  Plan:   No s/s of pre-eclampsia and likely just poorly controlled cHTN, but given BPs, I told her to go to [facility] for evaluation.  Durene Romans MD Attending Center for [employer])

## 2016-04-03 NOTE — Procedures (Signed)
Nexplanon Insertion Procedure Note  Pre-operative Diagnosis: Desire for nexplanon  Post-operative Diagnosis: same, nexplanon inserted  Procedure Details  Urine pregnancy test was not done.  The risks (including infection, bleeding, pain, etc) and benefits of the procedure were explained to the patient and Written informed consent was obtained.  Patient states she is right handed. Left hand placed behind her head and area, and an insertion site was selected 8 - 10 cm from medial epicondyle and marked. Procedure area was prepped with alcohol and 69mL of 1% lidocaine injected into the subcutaneous insertion area. Next it was swabbed with betadine and the Nexplanon was noted inside the device and placed in a sterile and usual fashion.  Nexplanon  capsule was palpated by provider and patient to assure satisfactory placement. Estimated blood loss: minimal Gauze and bandaids applied to area  Lot [id number] Condition: Stable  Complications: None  Plan: Advised to remove in 3 years. Change in bleeding pattern possibility d/w pt and told to try and give it 3 months before coming in for evaluation  Durene Romans MD Attending Center for [facility])

## 2016-08-14 IMAGING — US US MFM OB FOLLOW-UP
1 series · 13 of 28 positions shown · non-contrast
Comparison: none

[Series 1: us mfm ob follow-up · 93 acquisitions, 13 frames shown]
[im 4/93]
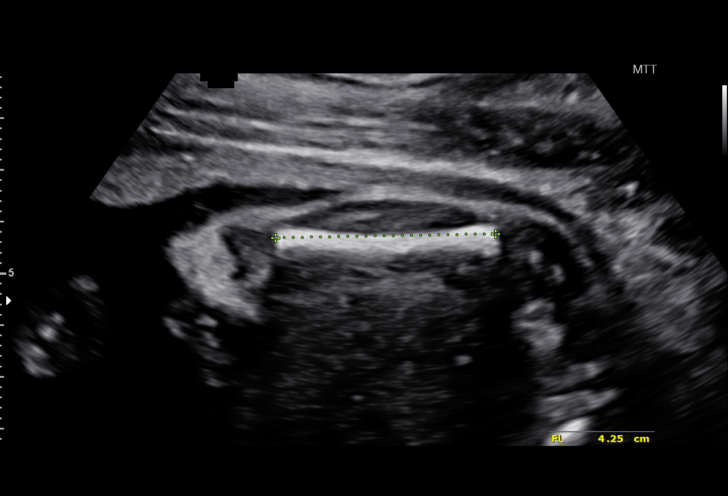
[im 11/93]
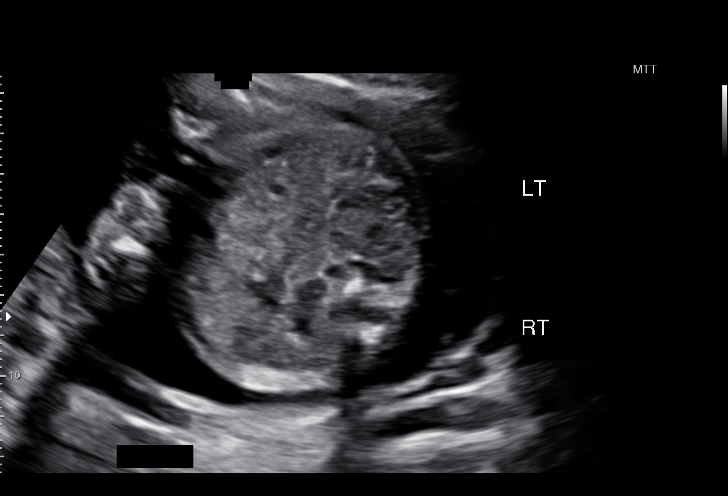
[im 18/93]
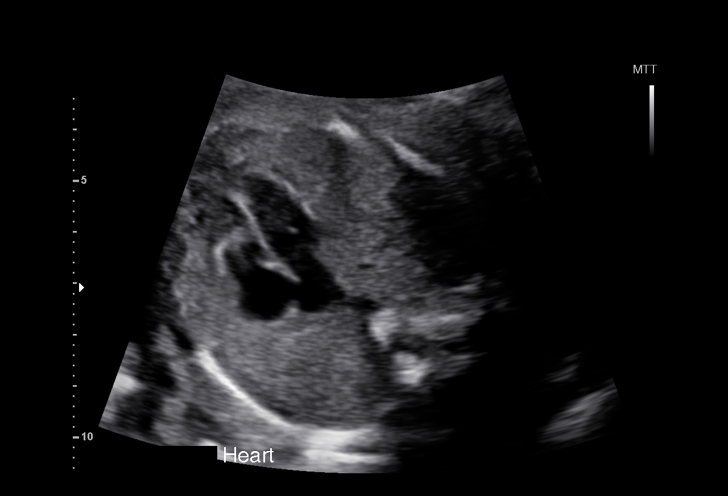
[im 24/93]
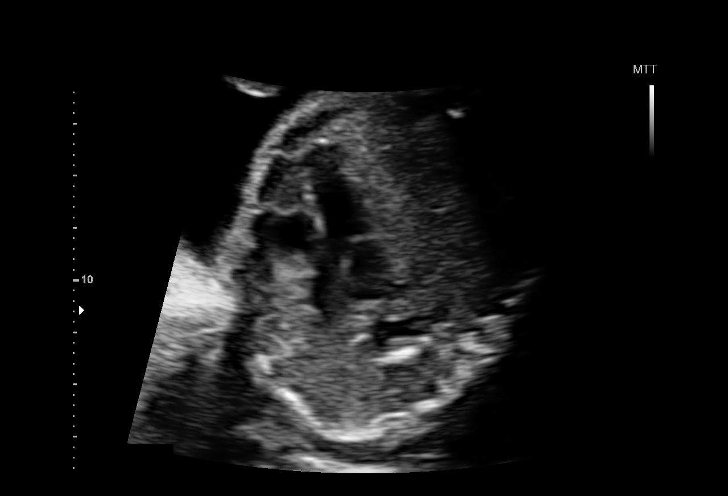
[im 31/93]
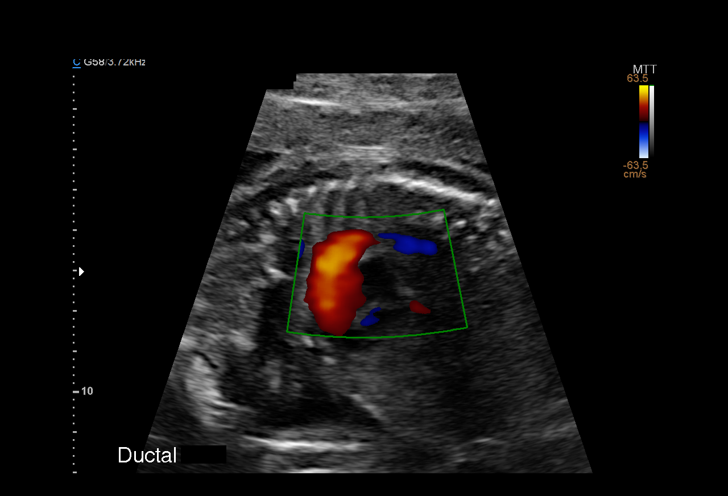
[im 38/93]
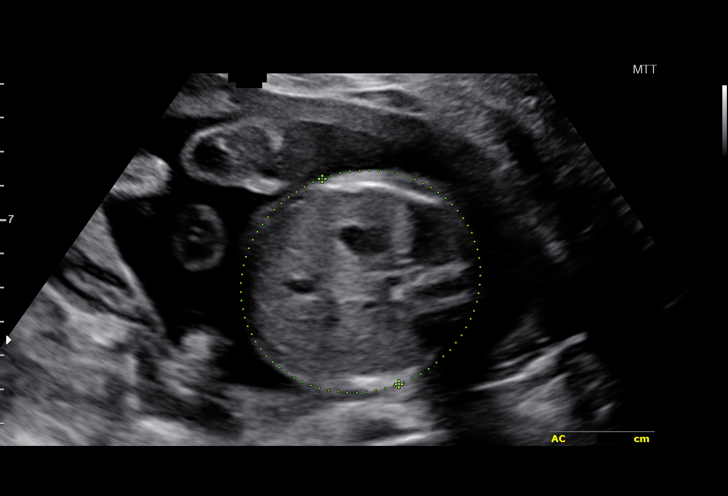
[im 48/93]
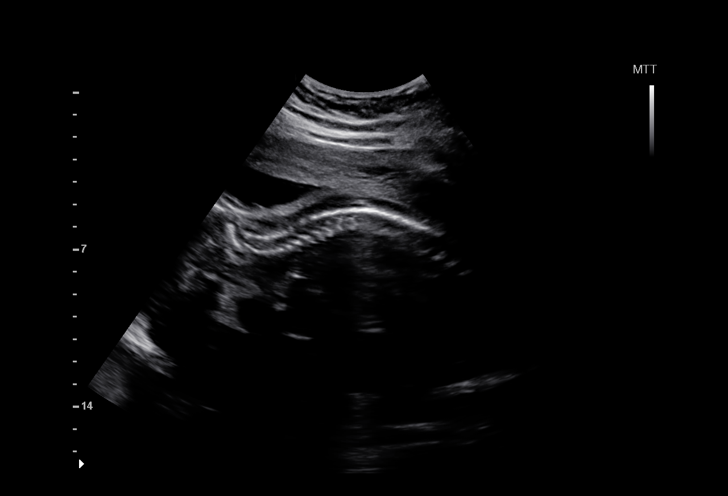
[im 55/93]
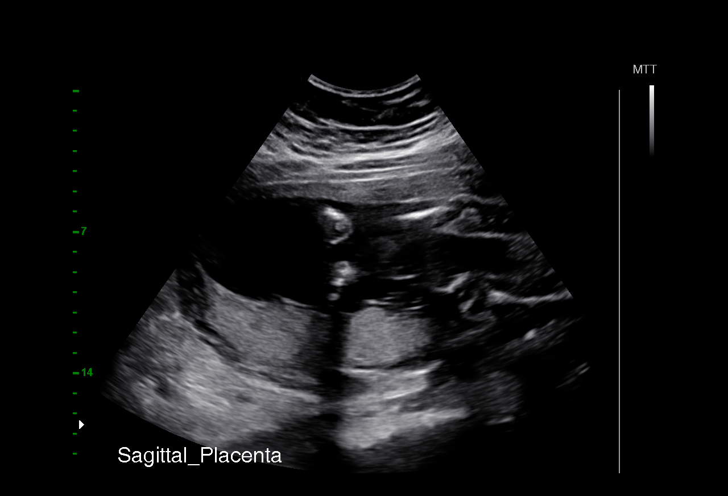
[im 62/93]
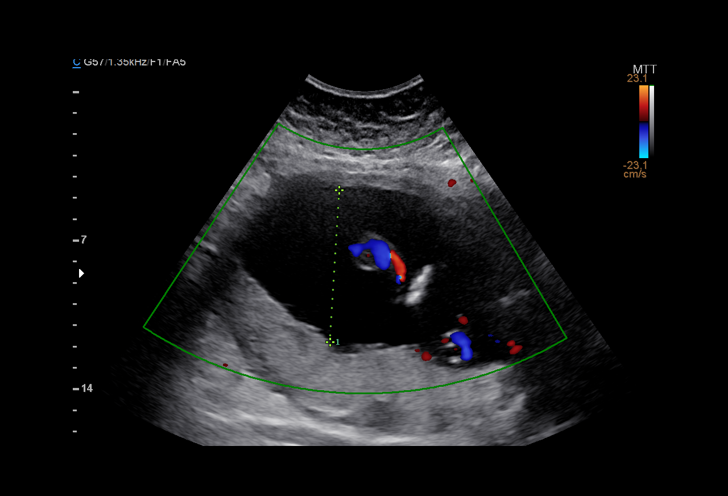
[im 69/93]
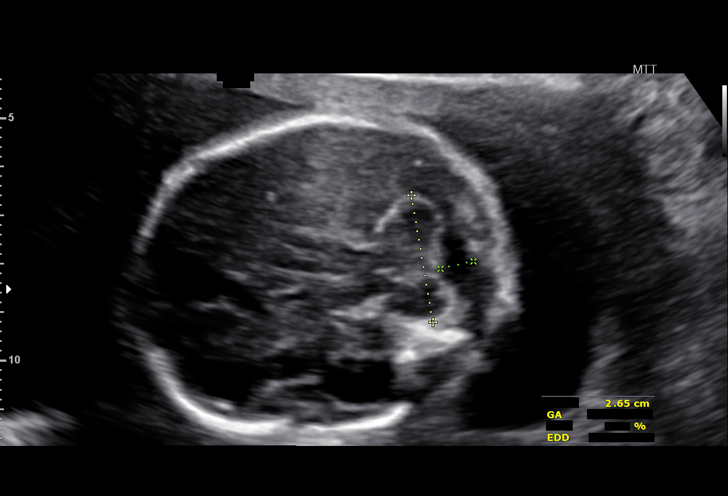
[im 75/93]
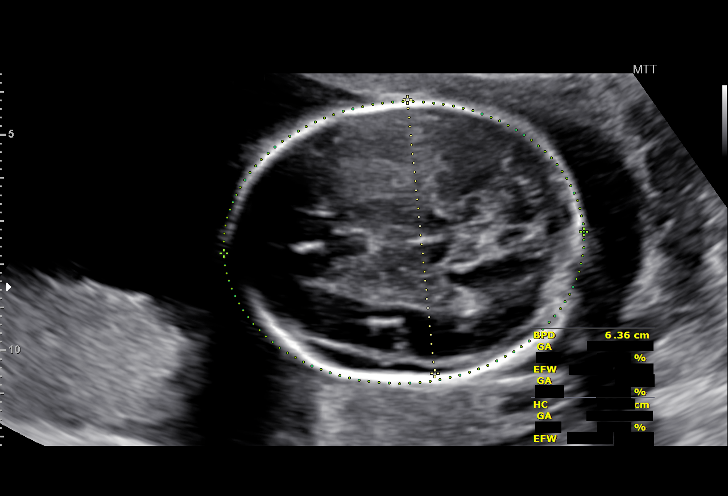
[im 82/93]
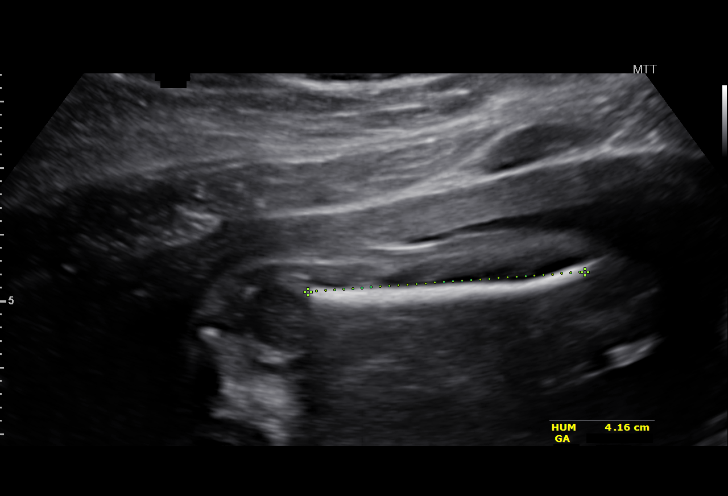
[im 89/93]
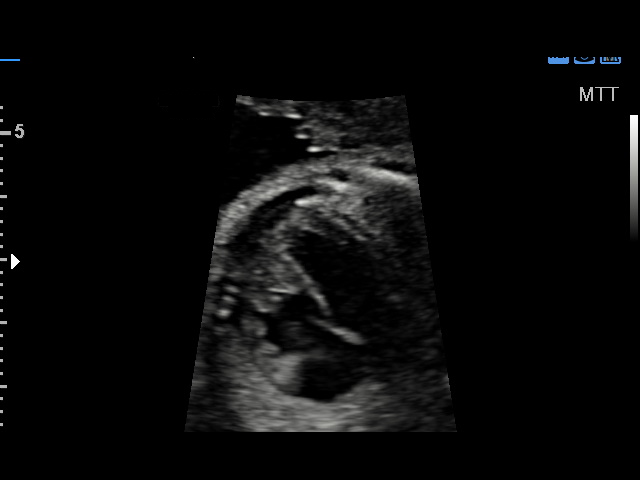

[13 of 28 positions shown; findings below may reference images not displayed]

pm)

Date:

Faculty Physician
Hospital OB/Gyn
Clinic

1  POLIN BILLIOT            [id number]       [phone]     [id number]
Indications

26 weeks gestation of pregnancy
Abnormal biochemical screen (quad) for
Trisomy 21 (DSR [DATE]); declined testing
Hypertension - Chronic/Pre-existing
(labetalol, ASA)
Obesity complicating pregnancy, second
trimester
Follow-up incomplete fetal anatomic             Z36
evaluation
OB History

Height:        5'3"   Weight:   251        BMI:
Gravidity:     1
Fetal Evaluation
Num Of Fetuses:      1
Fetal Heart          128
Rate(bpm):
Cardiac Activity:    Observed
Presentation:        Breech
Placenta:            Posterior, above cervical os
P. Cord Insertion:   Previously Visualized

Amniotic Fluid
AFI FV:      Subjectively within normal limits
Larg Pckt:    7.16   cm
Biometry

BPD:        63  mm     G. Age:   25w 4d                  CI:        71.78   %    70 - 86
FL/HC:      18.4   %    18.6 -
HC:      236.7  mm     G. Age:   25w 5d        19   %    HC/AC:      1.10        1.04 -
AC:      216.1  mm     G. Age:   26w 1d        44   %    FL/BPD      69.0   %    71 - 87
:
FL:       43.5  mm     G. Age:   24w 2d          4  %    FL/AC:      20.1   %    20 - 24
HUM:      41.5  mm     G. Age:   25w 1d        22   %
CER:      27.2  mm     G. Age:   24w 4d        23   %
LV:        2.7  mm
CM:          7  mm

Est.         803   gm   1 lb 12 oz      40   %
FW:
Gestational Age

LMP:           26w 0d        Date:  05/25/15                  EDD:   02/29/16
U/S Today:     25w 3d                                         EDD:   03/04/16
Best:          26w 0d    Det. By:   LMP  (05/25/15)           EDD:   02/29/16
Anatomy

Cranium:          Appears normal         Aortic Arch:       Appears normal
Fetal Cavum:      Appears normal         Ductal Arch:       Appears normal
Ventricles:       Appears normal         Diaphragm:         Appears normal
Choroid Plexus:   Appears normal         Stomach:           Appears normal,
left sided
Cerebellum:       Appears normal         Abdomen:           Appears normal
Posterior         Appears normal         Abdominal          Appears nml (cord
Fossa:                                   Wall:              insert, abd wall)
Nuchal Fold:      Not applicable (>20    Cord Vessels:      Appears normal (3
wks GA)                                   vessel cord)
Face:             Orbits and profile     Kidneys:           Appear normal
previously seen
Lips:             Previously seen        Bladder:           Appears normal
Fetal Thoracic:   Appears normal         Spine:             Appears normal
Heart:            Appears normal         Upper              Previously seen
(4CH, axis, and        Extremities:
situs
RVOT:             Appears normal         Lower              Previously seen
Extremities:
LVOT:             Appears normal
Other:   Fetus appears to be a male. Heels and 5th digit visualized
previously. Nasal bone visualized previously. Technically difficult
due to maternal habitus and fetal position.
Cervix Uterus Adnexa

Cervix
Length:           4.12   cm.
Normal appearance by transabdominal scan.

Uterus
No abnormality visualized.

Left Ovary
Size(cm)      3.18      2.96       1.51     Vol(ml):
Within normal limits. No adnexal mass visualized.

Right Ovary
Size(cm)      2.26      2.78       1.92     Vol(ml):
Within normal limits. No adnexal mass visualized.

Cul De        No free fluid seen.
Sac:

Adnexa:       No abnormality visualized.
Impression

SIUP at 26+0 weeks
Normal interval anatomy; anatomic survey complete
Normal amniotic fluid volume
Appropriate interval growth with EFW at the 40th %tile

---------------------------------------------------------------------- Recommendations

Follow-up ultrasound for growth in 4 weeks

## 2016-09-10 IMAGING — US US MFM OB FOLLOW-UP
1 series · 13 of 28 positions shown · non-contrast
Comparison: none

[Series 1: us mfm ob follow-up · 13 of 47 slices shown]
[im 2/47]
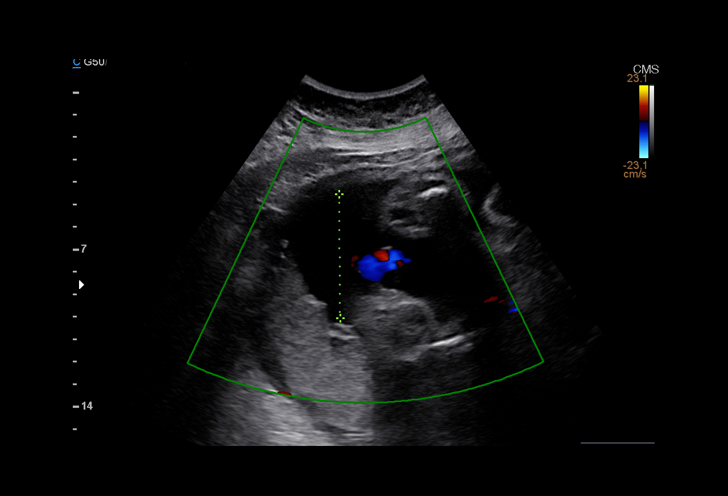
[im 6/47]
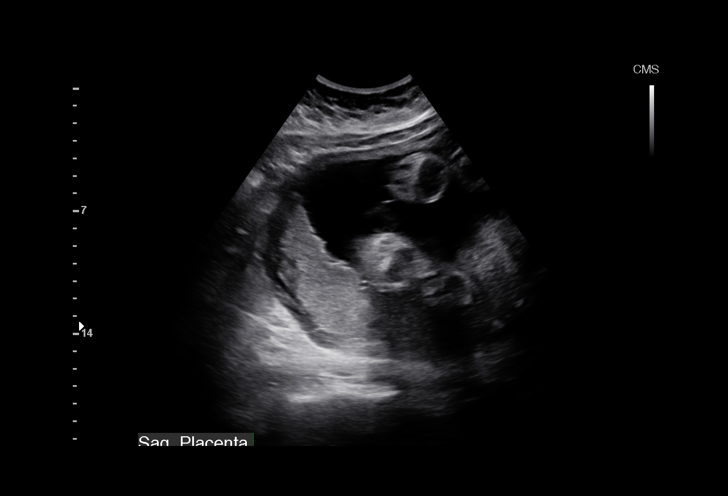
[im 9/47]
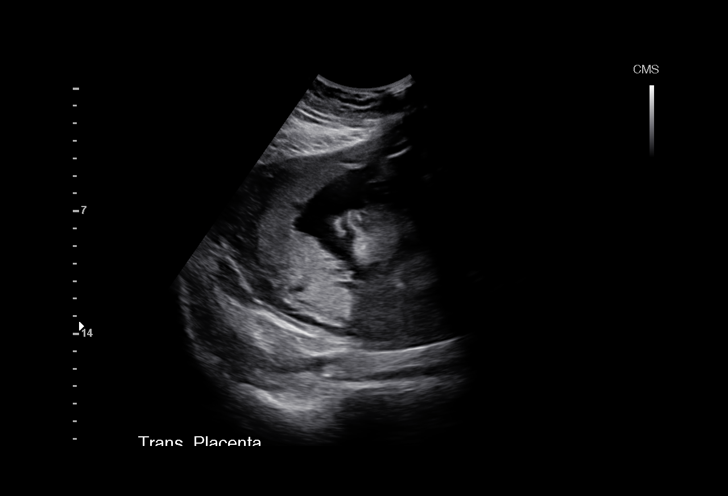
[im 12/47]
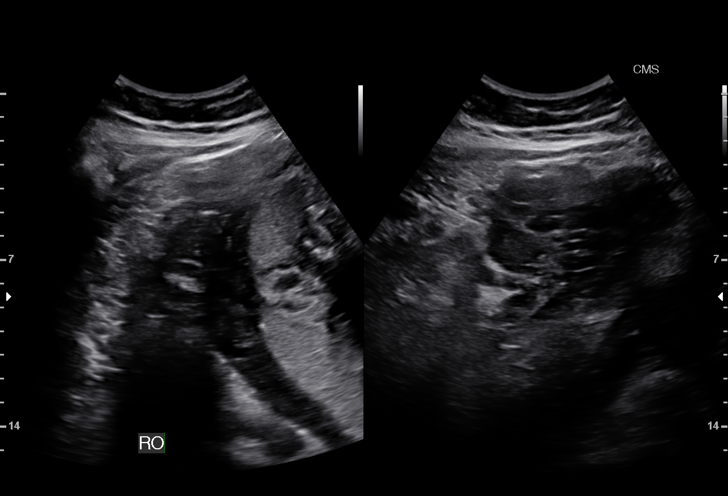
[im 16/47]
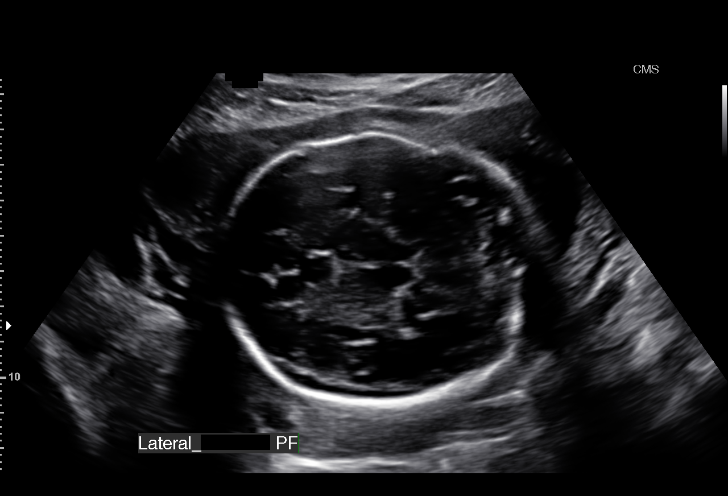
[im 19/47]
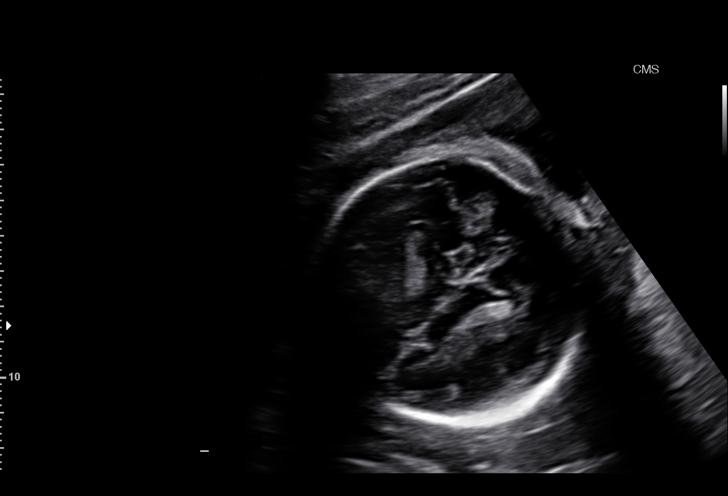
[im 24/47]
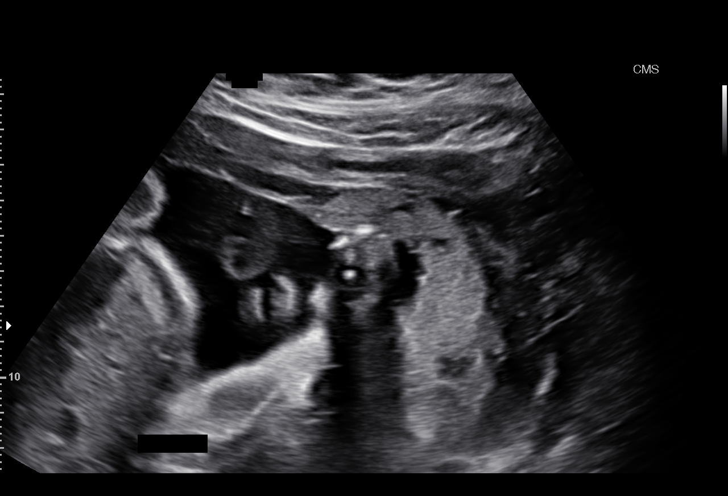
[im 28/47]
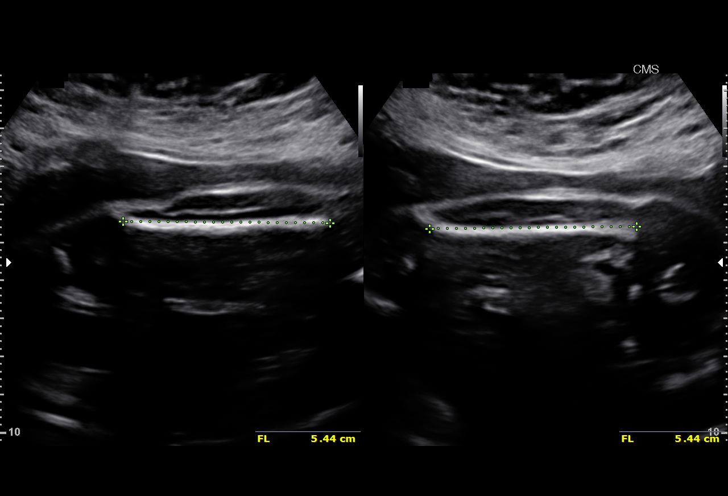
[im 31/47]
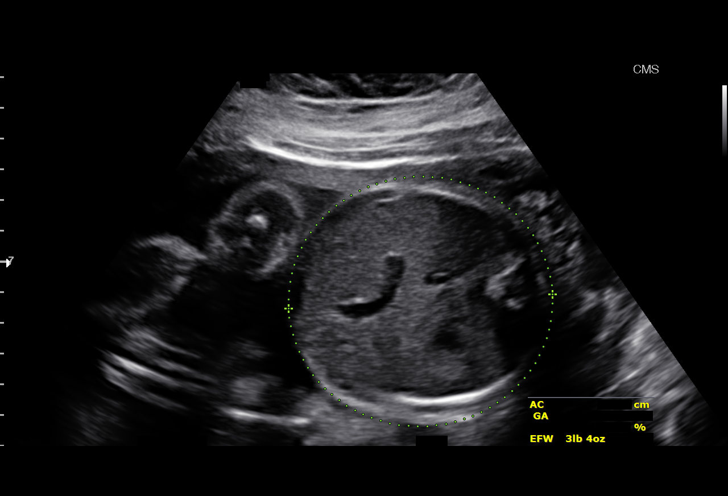
[im 35/47]
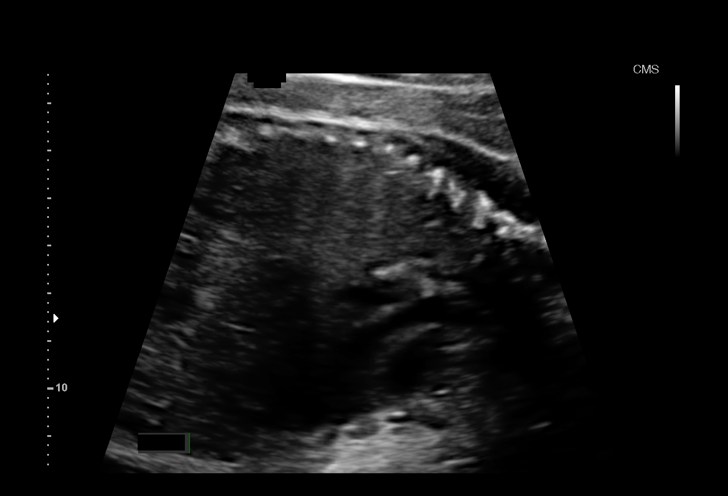
[im 38/47]
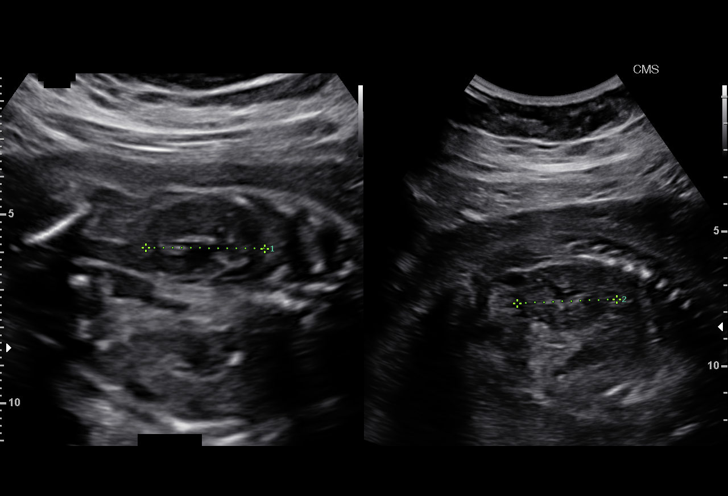
[im 41/47]
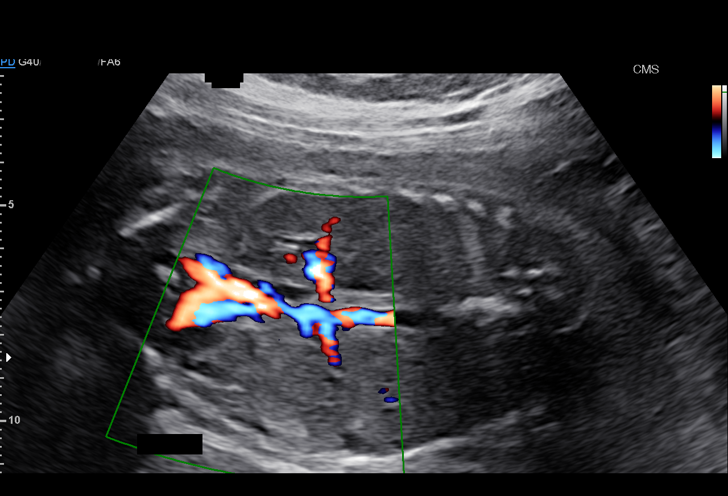
[im 45/47]
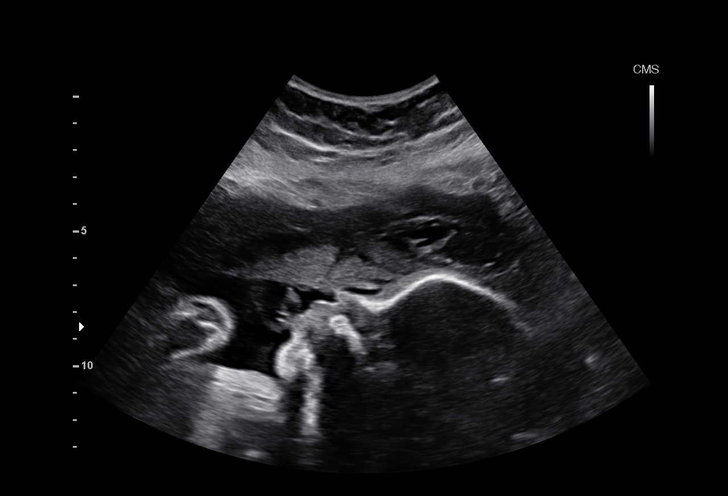

[13 of 28 positions shown; findings below may reference images not displayed]

pm)

Hospital Clinic-
Faculty Physician
OB/Gyn Clinic

1  SIS BEE MASOBA            [id number]      [phone]     [id number]
Indications

29 weeks gestation of pregnancy
Abnormal biochemical screen (quad) for
Trisomy 21 (DSR [DATE]); declined testing
Hypertension - Chronic/Pre-existing
(labetalol, ASA)
Obesity complicating pregnancy, third
trimester
OB History

Height:       5'3"    Weight:   251        BMI:
Gravidity:    1
Fetal Evaluation

Num Of Fetuses:     1
Fetal Heart         137
Rate(bpm):
Cardiac Activity:   Observed
Presentation:       Cephalic
Placenta:           Posterior, above cervical os
P. Cord Insertion:  Previously Visualized
Amniotic Fluid
AFI FV:      Subjectively within normal limits
AFI Sum:     11.38   cm       24  %Tile     Larg Pckt:    5.52  cm
RUQ:   5.52    cm   LUQ:    3.55   cm    LLQ:   2.31    cm
Biometry

BPD:      76.3  mm     G. Age:  30w 4d                  CI:        71.86   %    70 - 86
FL/HC:      19.0   %    19.2 -
HC:      286.5  mm     G. Age:  31w 3d         63  %    HC/AC:      1.09        0.99 -
AC:      263.1  mm     G. Age:  30w 3d         62  %    FL/BPD:     71.3   %    71 - 87
FL:       54.4  mm     G. Age:  28w 5d         11  %    FL/AC:      20.7   %    20 - 24
HUM:        49  mm     G. Age:  28w 6d         29  %

Est. FW:    5721  gm      3 lb 5 oz     56  %
Gestational Age

LMP:           29w 6d        Date:  05/25/15                 EDD:   02/29/16
U/S Today:     30w 2d                                        EDD:   02/26/16
Best:          29w 6d     Det. By:  LMP  (05/25/15)          EDD:   02/29/16
Anatomy

Cranium:          Appears normal         Aortic Arch:      Previously seen
Fetal Cavum:      Appears normal         Ductal Arch:      Previously seen
Ventricles:       Appears normal         Diaphragm:        Appears normal
Choroid Plexus:   Appears normal         Stomach:          Appears normal, left
sided
Cerebellum:       Appears normal         Abdomen:          Appears normal
Posterior Fossa:  Appears normal         Abdominal Wall:   Previously seen
Nuchal Fold:      Not applicable (>20    Cord Vessels:     Previously seen
wks GA)
Face:             Appears normal         Kidneys:          Appear normal
(orbits and profile)
Lips:             Appears normal         Bladder:          Appears normal
Fetal Thoracic:   Appears normal         Spine:            Previously seen
Heart:            Previously seen        Upper             Previously seen
Extremities:
RVOT:             Previously seen        Lower             Previously seen
Extremities:
LVOT:             Appears normal

Other:  Fetus appears to be a male. Heels and 5th digit visualized previously.
Nasal bone visualized previously. Technically difficult due to maternal
habitus and fetal position.
Cervix Uterus Adnexa

Cervix
Not visualized (advanced GA >09wks)

Uterus
No abnormality visualized.

Left Ovary
Within normal limits.

Right Ovary
Within normal limits.
Cul De Sac:   No free fluid seen.
Adnexa:       No abnormality visualized.
Impression

SIUP at 90w0d, gestation complicated by CHTN and obesity
EFW 56th%'le, appropriate interval growth
no dysmorphic features
AFI is normal
no previa
maternal BP is 882's/744's but no symptoms of preeclampsia
patient reports taking labetalol late this morning (30 minutes
prior to ultrasound)
Recommendations

I discussed maternal BP with Dr. Werner Kurt.  The patient was
instructed to have a clinic visit this morning to facilitate
preeclampsia labs and follow up blood pressure
measurements to ensure that her blood pressure improves
once the labetalol begins to take effect.  Provided
preeclampsia is ruled out, I recommended and scheduled:

-weekly AFI beginning at 32 weeks to
-interval growth in 4 and 8 weeks.

Additionally, I recommend twice weekly NST in the high risk
clinic and delivery at 38-39 weeks provided she does not
meet criteria for the diagnosis of preeclampsia.
Attending Physician, JOSELITO

## 2016-10-08 IMAGING — US US MFM OB FOLLOW-UP
1 series · 14 of 28 positions shown · non-contrast
Comparison: none

[Series 1: us mfm ob follow-up · 31 acquisitions, 14 frames shown]
[im 2/31]
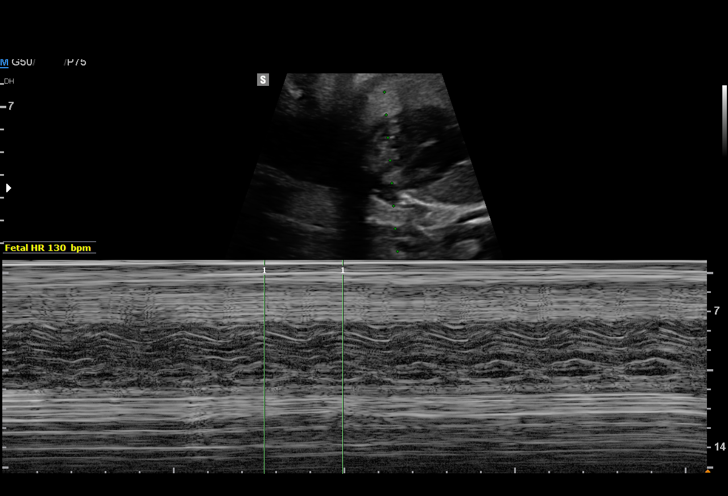
[im 4/31]
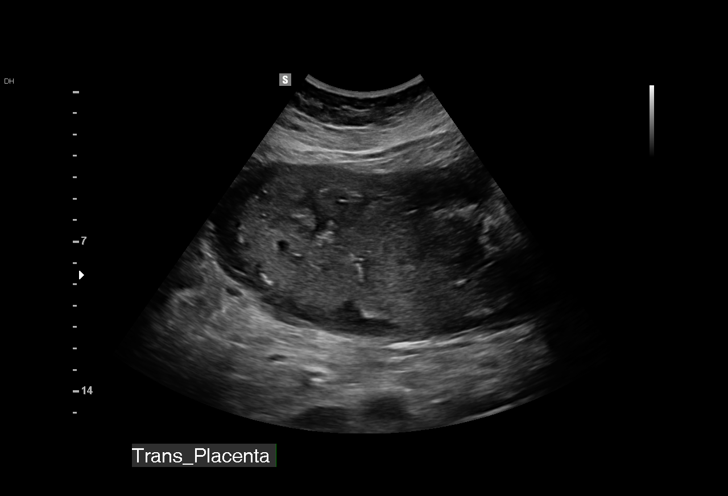
[im 6/31]
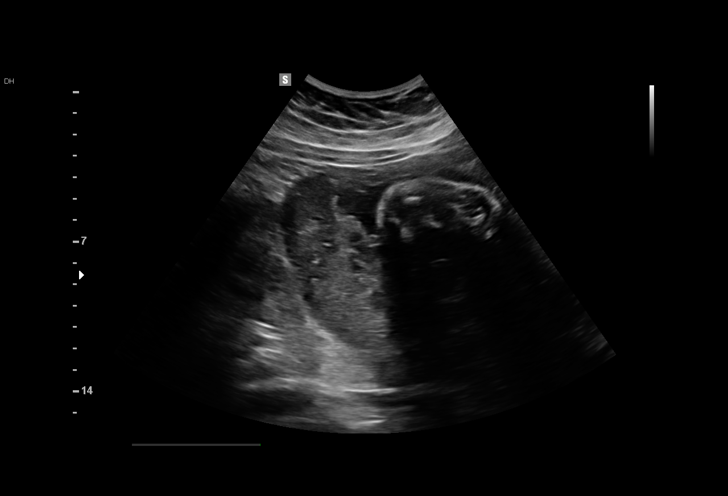
[im 8/31]
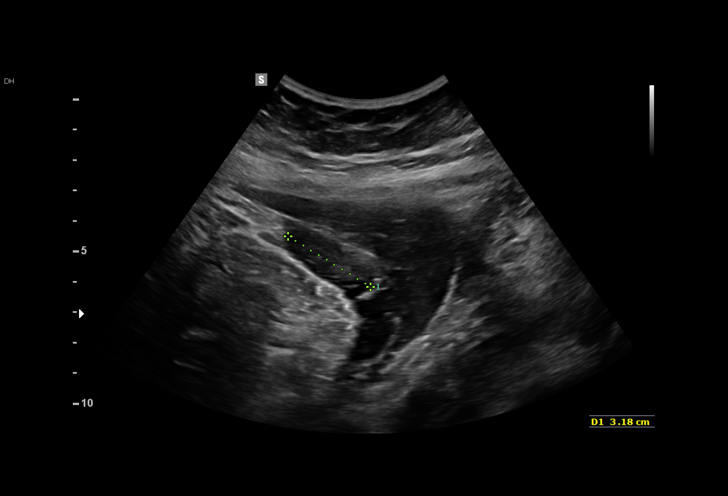
[im 11/31]
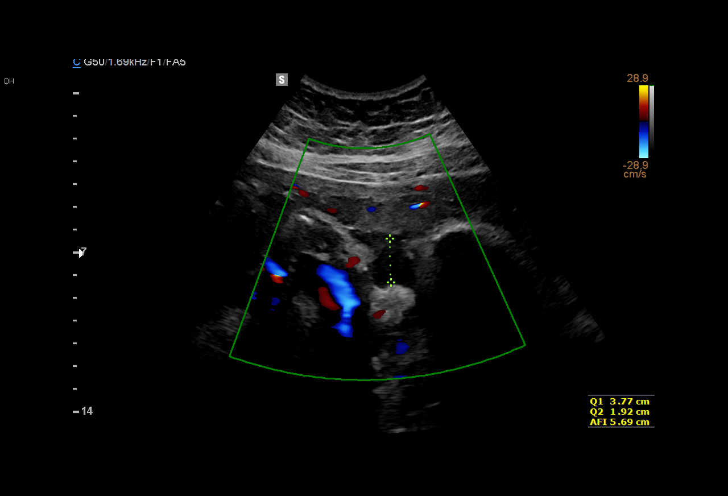
[im 13/31]
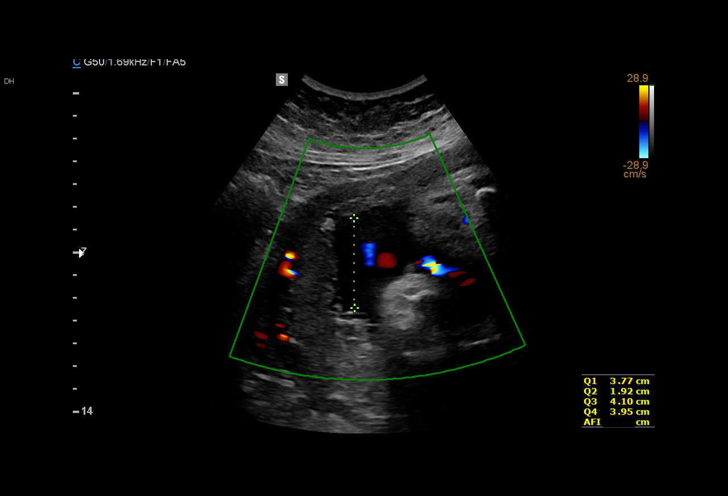
[im 15/31]
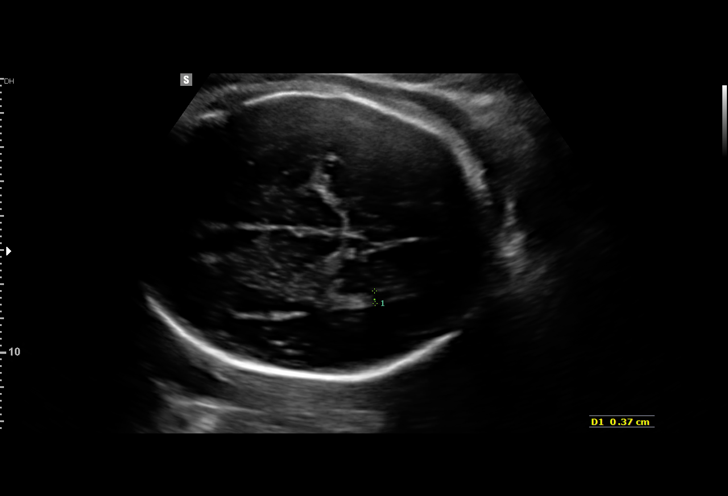
[im 17/31]
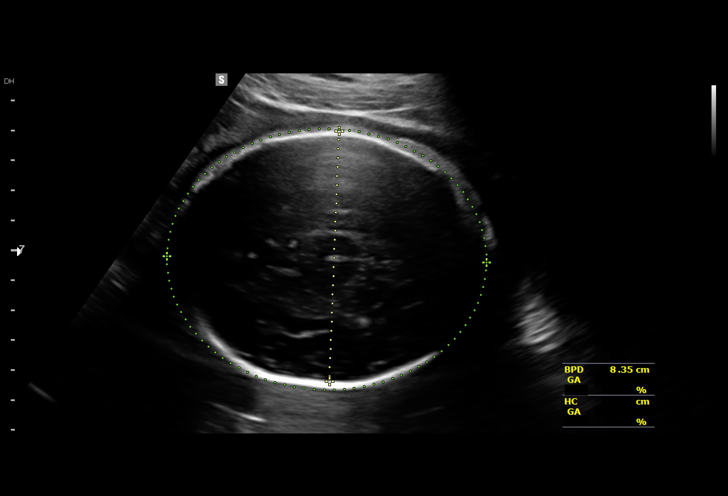
[im 19/31]
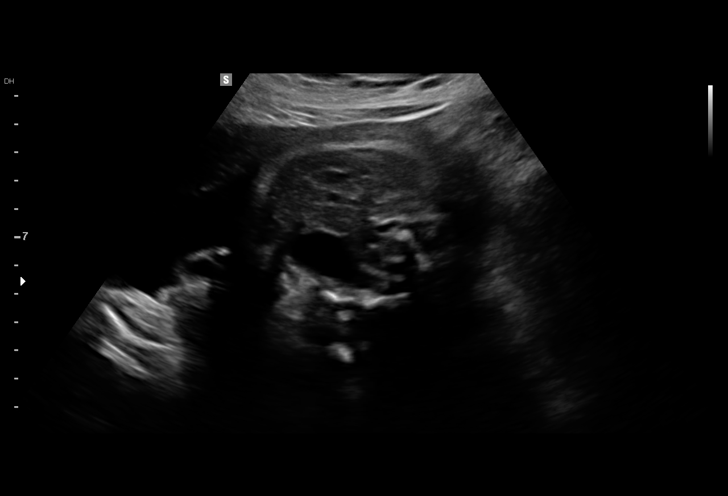
[im 22/31]
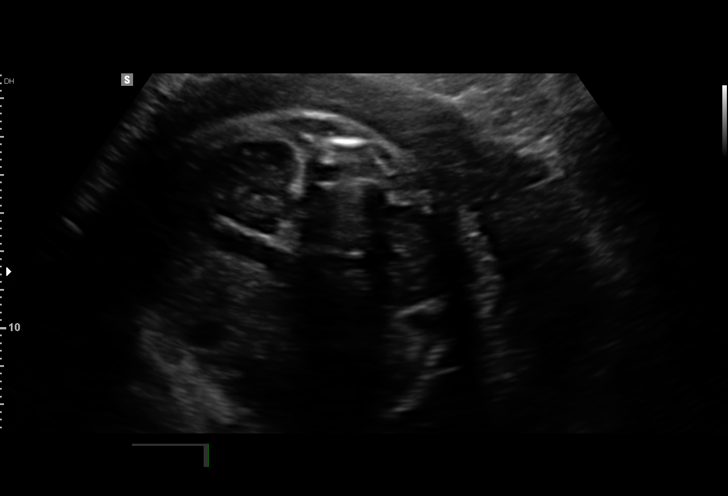
[im 24/31]
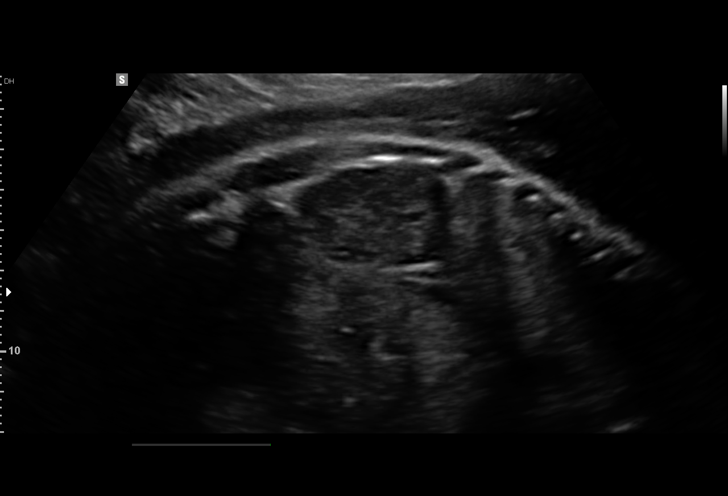
[im 26/31]
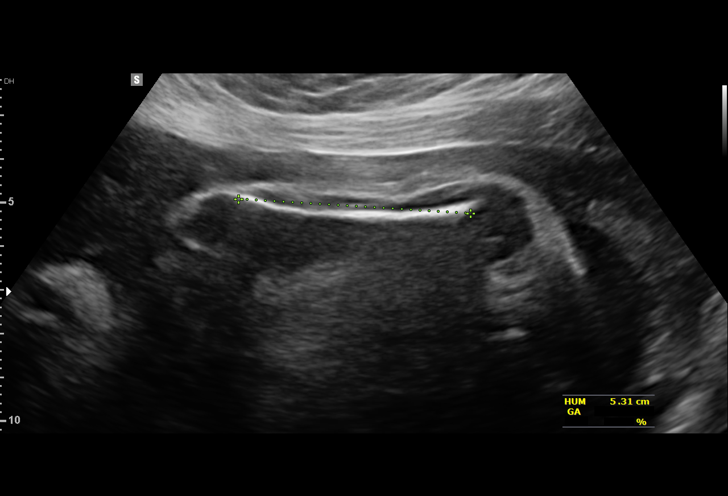
[im 28/31]
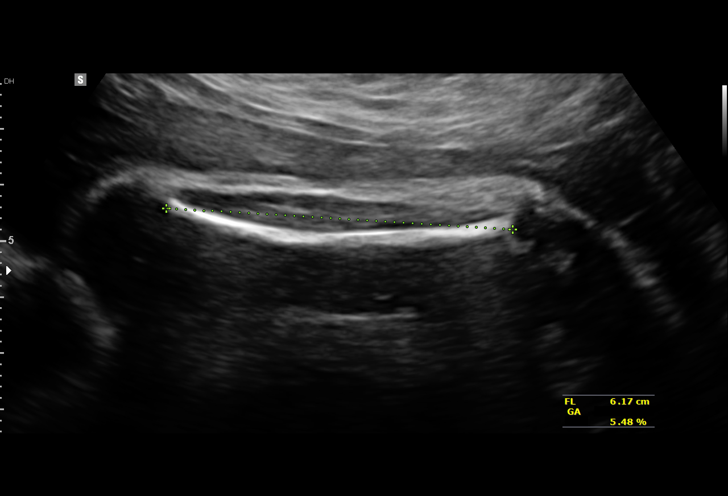
[im 31/31]
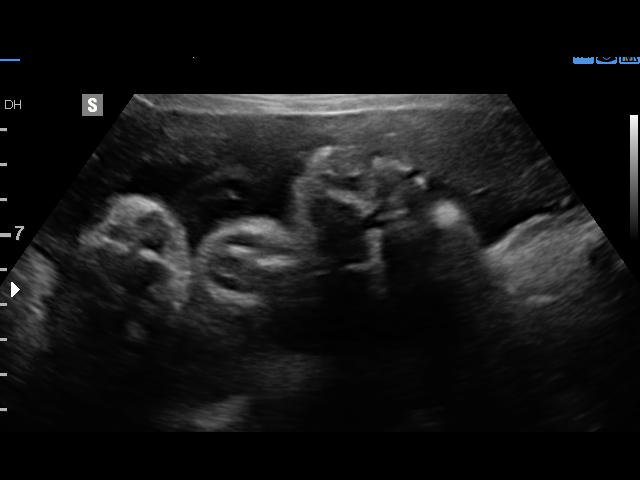

[14 of 28 positions shown; findings below may reference images not displayed]

Hospital Clinic-
Faculty Physician
OB/Gyn Clinic

Indications

33 weeks gestation of pregnancy
Abnormal biochemical screen (quad) for
Trisomy 21 (DSR [DATE]); declined testing
Hypertension - Chronic/Pre-existing
(labetalol, ASA)
Obesity complicating pregnancy, third
trimester
OB History

Height:       5'3"    Weight:   251       BMI:
Gravidity:    1
Fetal Evaluation

Num Of Fetuses:     1
Fetal Heart         130
Rate(bpm):
Cardiac Activity:   Observed
Presentation:       Cephalic
Placenta:           Posterior right, above cervical os
P. Cord Insertion:  Previously Visualized
Amniotic Fluid
AFI FV:      Subjectively within normal limits
AFI Sum:     13.74   cm      46   %Tile     Larg Pckt:     4.1  cm
RUQ:   3.77   cm    RLQ:    3.95   cm    LUQ:   1.92    cm   LLQ:    4.1    cm
Biometry

BPD:      83.3  mm     G. Age:  33w 4d                  CI:        76.16   %   70 - 86
FL/HC:      20.4   %   19.4 -
HC:      302.5  mm     G. Age:  33w 4d         12  %    HC/AC:      1.02       0.96 -
AC:      295.5  mm     G. Age:  33w 4d         44  %    FL/BPD:     74.1   %   71 - 87
FL:       61.7  mm     G. Age:  32w 0d          6  %    FL/AC:      20.9   %   20 - 24
HUM:      53.6  mm     G. Age:  31w 1d        < 5  %

Est. FW:    9448  gm    4 lb 11 oz      44  %
Gestational Age

LMP:           33w 6d       Date:   05/25/15                 EDD:   02/29/16
U/S Today:     33w 1d                                        EDD:   03/05/16
Best:          33w 6d    Det. By:   LMP  (05/25/15)          EDD:   02/29/16
Anatomy

Cranium:          Appears normal         Aortic Arch:      Previously seen
Fetal Cavum:      Appears normal         Ductal Arch:      Previously seen
Ventricles:       Appears normal         Diaphragm:        Previously seen
Choroid Plexus:   Previously seen        Stomach:          Appears normal, left
sided
Cerebellum:       Previously seen        Abdomen:          Appears normal
Posterior Fossa:  Previously seen        Abdominal Wall:   Previously seen
Nuchal Fold:      Not applicable (>20    Cord Vessels:     Previously seen
wks GA)
Face:             Orbits and profile     Kidneys:          Appear normal
previously seen
Lips:             Previously seen        Bladder:          Appears normal
Fetal Thoracic:   Appears normal         Spine:            Previously seen
Heart:            Appears normal         Upper             Previously seen
(4CH, axis, and        Extremities:
situs)
RVOT:             Previously seen        Lower             Previously seen
Extremities:
LVOT:             Previously seen

Other:  Male gender previously seen. Heels and 5th digit visualized
previously. Nasal bone visualized previously. Technically difficult due
to advanced GA and fetal position.
Cervix Uterus Adnexa

Cervix
Not visualized (advanced GA >31wks)

Left Ovary
Within normal limits.

Right Ovary
Within normal limits.

Adnexa:       No abnormality visualized.
Impression

Single IUP at 33w 6d
Normal interval growth  (44th %tile)
Posterior placenta
Normal amniotic fluid volume (AFI 13.7 cm)

BP: 156/93; repeat 142/91 (reports that medications were
recently decreased / adjusted)
Recommendations

Continue 2x weekly NSTs with weekly AFIs
Ultrasound for growth in 4 weeks

## 2016-10-25 ENCOUNTER — Encounter (HOSPITAL_COMMUNITY): Payer: Self-pay | Admitting: Emergency Medicine

## 2016-10-25 ENCOUNTER — Emergency Department (HOSPITAL_COMMUNITY)
Admission: EM | Admit: 2016-10-25 | Discharge: 2016-10-25 | Disposition: A | Discharge status: Home / Self Care | Payer: Medicaid Other | Attending: Emergency Medicine | Admitting: Emergency Medicine

## 2016-10-25 DIAGNOSIS — Z79899 Other long term (current) drug therapy: Secondary | ICD-10-CM | POA: Diagnosis not present

## 2016-10-25 DIAGNOSIS — I10 Essential (primary) hypertension: Secondary | ICD-10-CM | POA: Insufficient documentation

## 2016-10-25 MED ORDER — LISINOPRIL 10 MG PO TABS: 10.0000 mg | ORAL_TABLET | Freq: Every day | ORAL | 0 refills | Status: DC

## 2016-10-25 MED ORDER — HYDROCHLOROTHIAZIDE 25 MG PO TABS: 25.0000 mg | ORAL_TABLET | Freq: Every day | ORAL | 0 refills | Status: DC

## 2016-10-25 NOTE — ED Triage Notes (Addendum)
Pt reports hypertension with recent MD evaluation and placement on Losartin in the morning. Pt reports she feels that her dose is too low and she thinks she needs it increased. Pt was previosly on Lisinopril and then switched to a beta blocker while pregnant. After delivery pt was not placed back on Lisinopril. PT reports occasional headaches and sweating at night but none at present

## 2016-10-25 NOTE — ED Provider Notes (Signed)
[facility] DEPT Provider Note   CSN: [id number] Arrival date & time: 10/25/16  0930     History   Chief Complaint Chief Complaint  Patient presents with  . Hypertension    HPI [name] [name] is a 31 y.o. female.  HPI   Pt to the ER requesting change of her blood pressure medications to what they were prior to getting pregnany. She is now 8 months from delivery and is not breast feeding. She reports her BP;s have been in the 200's at home. She is taken losartan/HCTZ combo and wants to go back on lisinopril and HCTZ. She has been having headaches and night sweats. Currently asymptomatic. She is unable to get in to see her PCP and her mom brought her to the ER to be evaluated.  Past Medical History:  Diagnosis Date  . Condyloma acuminata   . HTN in pregnancy, chronic 09/24/2015   [place] labs: [x ]Cr, [x]  24hr protein (208) P:Cr 92 Plat: 207 AST/ALT: 10/10    . Hypertension   . Obesity in pregnancy    Antepartum, first trimester  . URI (upper respiratory infection)     Patient Active Problem List   Diagnosis Date Noted  . Hypertensive urgency 04/03/2016  . HTN in pregnancy, chronic 09/24/2015    Past Surgical History:  Procedure Laterality Date  . CESAREAN SECTION N/A 02/25/2016   Procedure: CESAREAN SECTION;  Surgeon: Truett Mainland, DO;  Location: [facility];  Service: Obstetrics;  Laterality: N/A;  . NO PAST SURGERIES      OB History    Gravida Para Term Preterm AB Living   1 1 1     1    SAB TAB Ectopic Multiple Live Births         0 1       Home Medications    Prior to Admission medications   Medication Sig Start Date End Date Taking? Authorizing Provider  hydrochlorothiazide (HYDRODIURIL) 25 MG tablet Take 1 tablet (25 mg total) by mouth daily. 10/25/16   Hinata Diener Carlota Raspberry, PA-C  ibuprofen (ADVIL,MOTRIN) 600 MG tablet Take 1 tablet (600 mg total) by mouth every 6 (six) hours as needed for mild pain. Patient not taking: Reported on 04/03/2016  02/29/16   Lavonia Drafts, MD  lisinopril (PRINIVIL,ZESTRIL) 10 MG tablet Take 1 tablet (10 mg total) by mouth daily. 10/25/16   Delos Haring, PA-C  oxyCODONE-acetaminophen (PERCOCET/ROXICET) 5-325 MG tablet Take 1-2 tablets by mouth every 6 (six) hours as needed. Patient not taking: Reported on 04/03/2016 02/29/16   Lavonia Drafts, MD  Prenatal Vit-Min-FA-Fish Oil ([employer] PRENATAL GUMMY PO) Take 2 each by mouth daily. Reported on 04/03/2016    Historical Provider, MD  triamcinolone cream (KENALOG) 0.1 % Apply 1 application topically 2 (two) times daily. Patient not taking: Reported on 02/14/2016 10/18/15   Truett Mainland, DO    Family History Family History  Problem Relation Age of Onset  . Hypertension Mother   . Heart disease Mother     Social History Social History  Substance Use Topics  . Smoking status: Never Smoker  . Smokeless tobacco: Never Used  . Alcohol use No     Allergies   Patient has no known allergies.   Review of Systems Review of Systems Review of Systems All other systems negative except as documented in the HPI. All pertinent positives and negatives as reviewed in the HPI.   Physical Exam Updated Vital Signs BP (!) 162/119 (BP Location: Right Arm)   Pulse  90   Temp 98.1 F (36.7 C) (Oral)   Resp 18   Ht 5\' 3"  (1.6 m)   Wt 103.4 kg   SpO2 99%   BMI 40.39 kg/m   Physical Exam  Constitutional: She appears well-developed and well-nourished.  HENT:  Head: Normocephalic and atraumatic.  Eyes: Conjunctivae are normal. Pupils are equal, round, and reactive to light.  Neck: Trachea normal, normal range of motion and full passive range of motion without pain. Neck supple.  Cardiovascular: Normal rate, regular rhythm and normal pulses.   Pulmonary/Chest: Effort normal and breath sounds normal. Chest wall is not dull to percussion. She exhibits no tenderness, no crepitus, no edema, no deformity and no retraction.  Abdominal: Soft. Normal  appearance and bowel sounds are normal.  Musculoskeletal: Normal range of motion.  Neurological: She is alert. She has normal strength.  Skin: Skin is warm, dry and intact.  Psychiatric: She has a normal mood and affect. Her speech is normal and behavior is normal. Judgment and thought content normal. Cognition and memory are normal.     ED Treatments / Results  Labs (all labs ordered are listed, but only abnormal results are displayed) Labs Reviewed - No data to display  EKG  EKG Interpretation None       Radiology No results found.  Procedures Procedures (including critical care time)  Medications Ordered in ED Medications - No data to display   Initial Impression / Assessment and Plan / ED Course  I have reviewed the triage vital signs and the nursing notes.  Pertinent labs & imaging results that were available during my care of the patient were reviewed by me and considered in my medical decision making (see chart for details).  Clinical Course     Will start patient on lisinopril 10mg  PO daily plus HCTZ 25 mg and discontinue other blood pressure medications. Patient is wanting better control of her BP and requests referral to specialist, will send her to [place].  I discussed results, diagnoses and plan with [name] [name]. They voice there understanding and questions were answered. We discussed follow-up recommendations and return precautions.   Final Clinical Impressions(s) / ED Diagnoses   Final diagnoses:  Hypertension, unspecified type    New Prescriptions New Prescriptions   HYDROCHLOROTHIAZIDE (HYDRODIURIL) 25 MG TABLET    Take 1 tablet (25 mg total) by mouth daily.   LISINOPRIL (PRINIVIL,ZESTRIL) 10 MG TABLET    Take 1 tablet (10 mg total) by mouth daily.     Delos Haring, PA-C 10/25/16 [place], MD 10/26/16 [phone]

## 2016-10-28 ENCOUNTER — Ambulatory Visit (INDEPENDENT_AMBULATORY_CARE_PROVIDER_SITE_OTHER): Payer: Medicaid Other | Admitting: Physician Assistant

## 2016-10-28 ENCOUNTER — Encounter: Payer: Self-pay | Admitting: Physician Assistant

## 2016-10-28 VITALS — BP 160/110 | HR 74 | Ht 63.0 in | Wt 229.8 lb

## 2016-10-28 DIAGNOSIS — R0683 Snoring: Secondary | ICD-10-CM

## 2016-10-28 DIAGNOSIS — I10 Essential (primary) hypertension: Secondary | ICD-10-CM

## 2016-10-28 MED ORDER — LISINOPRIL 20 MG PO TABS: 20.0000 mg | ORAL_TABLET | Freq: Every day | ORAL | 3 refills | Status: DC

## 2016-10-28 NOTE — Progress Notes (Signed)
Cardiology Office Note:    Date:  10/28/2016   ID:  [name] [name], DOB 1986/05/11, MRN [id number]  PCP:  Rachell Cipro, MD  Cardiologist:  Dr. Dorris Carnes   Electrophysiologist:  n/a  Referring MD: Fanny Bien, MD   Chief Complaint  Patient presents with  . Follow-up    Hypertension    History of Present Illness:    [name] [name] is a 31 y.o. female with a hx of pregnancy induced HTN.  She was seen by Dr. Dorris Carnes in 12/16.  She was seen in the HTN clinic once as well. She delivered in 5/17.  She was recently seen in the ED for uncontrolled blood pressure.  She was placed on Lisinopril and HCTZ  She was set up for follow up here.  She is here with her mother in law.  Ms. [name] [name] came off all of her medications after her son was born.  She recently started to check her blood pressure and started to get concerned with how high it was running.  Her PCP started Hyzaar but it continued to rise.  She went to the ED and was placed on Lisinopril and HCTZ. She was on this in the past.  She is currently on birth control (progesterone implant).  She denies chest pain, shortness of breath, syncope, orthopnea, PND. She has mild ankle edema.  She notes HAs when her BP is high.   Prior CV studies that were reviewed today include:    Echo 10/11/15 Mod LVH, EF 60-65, no RWMA, mild LAE  Past Medical History:  Diagnosis Date  . Condyloma acuminata   . HTN in pregnancy, chronic 09/24/2015   [place] labs: [x ]Cr, [x]  24hr protein (208) P:Cr 92 Plat: 207 AST/ALT: 10/10    . Hypertension   . Obesity in pregnancy    Antepartum, first trimester  . URI (upper respiratory infection)     Past Surgical History:  Procedure Laterality Date  . CESAREAN SECTION N/A 02/25/2016   Procedure: CESAREAN SECTION;  Surgeon: Truett Mainland, DO;  Location: [facility];  Service: Obstetrics;  Laterality: N/A;  . NO PAST SURGERIES      Current Medications: Current Meds  Medication Sig  .  hydrochlorothiazide (HYDRODIURIL) 25 MG tablet Take 1 tablet (25 mg total) by mouth daily.  . [DISCONTINUED] lisinopril (PRINIVIL,ZESTRIL) 10 MG tablet Take 1 tablet (10 mg total) by mouth daily.     Allergies:   Patient has no known allergies.   Social History   Social History  . Marital status: Single    Spouse name: n/a  . Number of children: 0  . Years of education: college   Occupational History  . Customer Service Rep [employer]   Social History Main Topics  . Smoking status: Never Smoker  . Smokeless tobacco: Never Used  . Alcohol use No  . Drug use: No  . Sexual activity: Yes    Birth control/ protection: None   Other Topics Concern  . None   Social History Narrative   Lives alone.  No family lives nearby.     Family History:  The patient's family history includes Heart disease in her mother; Hypertension in her mother.   ROS:   Please see the history of present illness.    Review of Systems  Constitution: Positive for diaphoresis.  Neurological: Positive for headaches.   All other systems reviewed and are negative.   EKGs/Labs/Other Test Reviewed:    EKG:  EKG is  ordered today.  The ekg ordered today demonstrates NSR, HR 74, normal axis, NSSTTW changes, QTc 428 ms  Recent Labs: 02/22/2016: ALT 10; BUN 12; Creatinine, Ser 0.56; Potassium 3.7; Sodium 137 02/26/2016: Hemoglobin 9.9; Platelets 217   Recent Lipid Panel No results found for: CHOL, TRIG, HDL, CHOLHDL, VLDL, LDLCALC, LDLDIRECT   Physical Exam:    VS:  BP (!) 160/110   Pulse 74   Ht 5\' 3"  (1.6 m)   Wt 229 lb 12.8 oz (104.2 kg)   BMI 40.71 kg/m     Wt Readings from Last 3 Encounters:  10/28/16 229 lb 12.8 oz (104.2 kg)  10/25/16 228 lb (103.4 kg)  04/03/16 228 lb 9.6 oz (103.7 kg)     Physical Exam  Constitutional: She is oriented to person, place, and time. She appears well-developed and well-nourished. No distress.  HENT:  Head: Normocephalic and atraumatic.  Eyes: No  scleral icterus.  Neck: No JVD present. No thyromegaly present.  Cardiovascular: Normal rate, regular rhythm and normal heart sounds.   No murmur heard. Pulmonary/Chest: Effort normal. She has no wheezes. She has no rales.  Abdominal: Soft. There is no tenderness.  Musculoskeletal: She exhibits no edema.  Neurological: She is alert and oriented to person, place, and time.  Skin: Skin is warm and dry.  Psychiatric: She has a normal mood and affect.    ASSESSMENT:    1. Essential hypertension   2. Snoring   3. Morbid obesity ([place])    PLAN:    In order of problems listed above:  1. HTN - BP is uncontrolled.  She is limiting her salt intake. She does snore but she denies daytime somnolence.  There has been no witnessed apnea.  She is mildly symptomatic with HAs when her BP goes high.    -  BMET, TSH today  -  Continue current dose of HCTZ  -  Increase Lisinopril to 20 mg QD  -  If BP is > 150/90 after 1 week, increase Lisinopril to 40 mg QD  -  BMET in 2 weeks.  2. Snoring - I have asked her to have her family monitor for apnea.  Consider sleep testing if needed.  3. Obesity - She is working on adjusting her diet. Check fasting Lipids and LFTs in 2 weeks.    Medication Adjustments/Labs and Tests Ordered: Current medicines are reviewed at length with the patient today.  Concerns regarding medicines are outlined above.  Medication changes, Labs and Tests ordered today are outlined in the Patient Instructions noted below. Patient Instructions  Medication Instructions:  1. INCREASE LISINOPRIL TO 20 MG DAILY; NEW RX HAS BEEN SENT IN  Labwork: 1. TODAY BMET, TSH  2. IN 2 WEEKS YOU WILL NEED FASTING LAB WORK (LIPID, LIVER AND BMET)  Testing/Procedures: NONE  Follow-Up: Awais Cobarrubias, PAC IN 3 WEEKS  Any Other Special Instructions Will Be Listed Below (If Applicable). PER Durrel Mcnee, PAC MONITOR BP DAILY; IF YOUR AFTER 1 WEEK ON THE INCREASED DOSE OF LISINOPRIL 20 MG AND BP  IS CONSISTENTLY ABOVE 150/90 THEN OK PER PA TO INCREASE LISINOPRIL TO 40 MG DAILY  If you need a refill on your cardiac medications before your next appointment, please call your pharmacy.  Signed, Richardson Dopp, PA-C  10/28/2016 4:20 PM    [facility] [address], [place], [place]  [address] Phone: [phone]; Fax: [phone]

## 2016-10-28 NOTE — Patient Instructions (Addendum)
Medication Instructions:  1. INCREASE LISINOPRIL TO 20 MG DAILY; NEW RX HAS BEEN SENT IN  Labwork: 1. TODAY BMET, TSH  2. IN 2 WEEKS YOU WILL NEED FASTING LAB WORK (LIPID, LIVER AND BMET)  Testing/Procedures: NONE  Follow-Up: SCOTT WEAVER, PAC IN 3 WEEKS  Any Other Special Instructions Will Be Listed Below (If Applicable). PER SCOTT WEAVER, PAC MONITOR BP DAILY; IF YOUR AFTER 1 WEEK ON THE INCREASED DOSE OF LISINOPRIL 20 MG AND BP IS CONSISTENTLY ABOVE 150/90 THEN OK PER PA TO INCREASE LISINOPRIL TO 40 MG DAILY  If you need a refill on your cardiac medications before your next appointment, please call your pharmacy.

## 2016-10-29 ENCOUNTER — Telehealth: Payer: Self-pay | Admitting: Physician Assistant

## 2016-10-29 DIAGNOSIS — I10 Essential (primary) hypertension: Secondary | ICD-10-CM

## 2016-10-29 LAB — BASIC METABOLIC PANEL
BUN / CREAT RATIO: 16 (ref 9–23)
BUN: 14 mg/dL (ref 6–20)
CHLORIDE: 97 mmol/L (ref 96–106)
CO2: 28 mmol/L (ref 18–29)
Calcium: 9.5 mg/dL (ref 8.7–10.2)
Creatinine, Ser: 0.88 mg/dL (ref 0.57–1.00)
GFR calc non Af Amer: 89 mL/min/{1.73_m2} (ref >59)
GFR, EST AFRICAN AMERICAN: 103 mL/min/{1.73_m2} (ref >59)
Glucose: 78 mg/dL (ref 65–99)
POTASSIUM: 3.4 mmol/L — AB (ref 3.5–5.2)
SODIUM: 139 mmol/L (ref 134–144)

## 2016-10-29 LAB — TSH: TSH: 2.04 u[IU]/mL (ref 0.450–4.500)

## 2016-10-29 MED ORDER — SPIRONOLACTONE 25 MG PO TABS
25.0000 mg | ORAL_TABLET | Freq: Every day | ORAL | 11 refills | Status: DC
Start: 2016-10-29 — End: 2018-08-26

## 2016-10-29 NOTE — Telephone Encounter (Signed)
Discussed medication changes with patient.   See BMET results from 10/28/16. New prescription for Spironolactone sent to her pharmacy. She verbalized understanding. Richardson Dopp, PA-C   10/29/2016 2:21 PM

## 2016-10-31 ENCOUNTER — Other Ambulatory Visit: Payer: Self-pay | Admitting: *Deleted

## 2016-11-03 ENCOUNTER — Telehealth: Payer: Self-pay | Admitting: Physician Assistant

## 2016-11-03 NOTE — Telephone Encounter (Signed)
Will forward to Dr. Ross and her nurse. 

## 2016-11-03 NOTE — Telephone Encounter (Signed)
New Message     Pt needs a letter stating she is being seen for high bp and she is on medication for a pre employment reference

## 2016-11-04 ENCOUNTER — Other Ambulatory Visit: Payer: Medicaid Other | Admitting: *Deleted

## 2016-11-04 ENCOUNTER — Encounter: Payer: Self-pay | Admitting: Internal Medicine

## 2016-11-04 DIAGNOSIS — I10 Essential (primary) hypertension: Secondary | ICD-10-CM

## 2016-11-04 NOTE — Telephone Encounter (Signed)
Letter now dictated  Please give to patient

## 2016-11-05 LAB — HEPATIC FUNCTION PANEL
ALK PHOS: 76 IU/L (ref 39–117)
ALT: 11 IU/L (ref 0–32)
AST: 14 IU/L (ref 0–40)
Albumin: 4 g/dL (ref 3.5–5.5)
BILIRUBIN TOTAL: 0.6 mg/dL (ref 0.0–1.2)
BILIRUBIN, DIRECT: 0.15 mg/dL (ref 0.00–0.40)
Total Protein: 6.9 g/dL (ref 6.0–8.5)

## 2016-11-05 LAB — LIPID PANEL
CHOLESTEROL TOTAL: 193 mg/dL (ref 100–199)
Chol/HDL Ratio: 5.4 ratio units — ABNORMAL HIGH (ref 0.0–4.4)
HDL: 36 mg/dL — ABNORMAL LOW (ref >39)
LDL Calculated: 136 mg/dL — ABNORMAL HIGH (ref 0–99)
Triglycerides: 103 mg/dL (ref 0–149)
VLDL CHOLESTEROL CAL: 21 mg/dL (ref 5–40)

## 2016-11-05 LAB — BASIC METABOLIC PANEL
BUN/Creatinine Ratio: 14 (ref 9–23)
BUN: 13 mg/dL (ref 6–20)
CALCIUM: 9.1 mg/dL (ref 8.7–10.2)
CO2: 21 mmol/L (ref 18–29)
CREATININE: 0.91 mg/dL (ref 0.57–1.00)
Chloride: 101 mmol/L (ref 96–106)
GFR calc Af Amer: 99 mL/min/{1.73_m2} (ref >59)
GFR, EST NON AFRICAN AMERICAN: 86 mL/min/{1.73_m2} (ref >59)
GLUCOSE: 83 mg/dL (ref 65–99)
Potassium: 4.1 mmol/L (ref 3.5–5.2)
SODIUM: 138 mmol/L (ref 134–144)

## 2016-11-05 NOTE — Telephone Encounter (Signed)
Patient was in the office yesterday and was able to pick up the letter at that time.  She appreciated the call back and response.

## 2016-11-06 ENCOUNTER — Telehealth: Payer: Self-pay | Admitting: *Deleted

## 2016-11-06 MED ORDER — AMLODIPINE BESYLATE 5 MG PO TABS: 5.0000 mg | ORAL_TABLET | Freq: Every day | ORAL | 3 refills | Status: DC

## 2016-11-06 NOTE — Telephone Encounter (Signed)
I called pt and went over recommendation per Brynda Rim. PA to add Amlodipine 5 mg daily to regimen. Monitor BP and bring readings to f/u appt 2/6 with Brynda Rim. PA. Pt verbalized understanding to continue Lisinopril 40 daily and Spironolactone 25 mg daily and to add the Amlodipine 5 mg daily. Rx sent in today.

## 2016-11-06 NOTE — Telephone Encounter (Signed)
Pt notified of lab results by phone. Pt states her BP is still running high. Today 166/124 around 10 am. Pt states she is starting to work out and she took this BP she states about 1 hour after exercise. Pt states she is taking Lisinopril 40 mg daily now. Pt saw Brynda Rim. PA who advised pt if BP > 150/90 after 1 week then ok to increase Lisinopril to 40 mg daily. Pt states she does understand why BP still running high. Pt denies any chest pain, dizziness, light-headeness, sob, edema, fevers. I advised pt I will d/w PA when he is back in the office tomorrow 11/07/16 for further advice about her BP and let her know of recommendations. Pt thanked me for my time.

## 2016-11-11 ENCOUNTER — Other Ambulatory Visit: Payer: Medicaid Other

## 2016-11-18 ENCOUNTER — Ambulatory Visit: Payer: Medicaid Other | Admitting: Physician Assistant

## 2016-12-10 ENCOUNTER — Ambulatory Visit: Payer: Medicaid Other | Admitting: Physician Assistant

## 2016-12-24 ENCOUNTER — Encounter: Payer: Self-pay | Admitting: Physician Assistant

## 2017-01-01 DIAGNOSIS — R197 Diarrhea, unspecified: Secondary | ICD-10-CM | POA: Diagnosis not present

## 2017-01-01 DIAGNOSIS — A09 Infectious gastroenteritis and colitis, unspecified: Secondary | ICD-10-CM | POA: Diagnosis not present

## 2017-05-05 ENCOUNTER — Other Ambulatory Visit: Payer: Self-pay | Admitting: Physician Assistant

## 2017-05-05 DIAGNOSIS — I10 Essential (primary) hypertension: Secondary | ICD-10-CM

## 2017-12-21 ENCOUNTER — Encounter: Payer: Self-pay | Admitting: *Deleted

## 2018-06-09 ENCOUNTER — Other Ambulatory Visit: Payer: Self-pay

## 2018-06-09 DIAGNOSIS — I10 Essential (primary) hypertension: Secondary | ICD-10-CM

## 2018-06-09 MED ORDER — LISINOPRIL 20 MG PO TABS: 20.0000 mg | ORAL_TABLET | Freq: Every day | ORAL | 1 refills | Status: DC

## 2018-06-09 MED ORDER — AMLODIPINE BESYLATE 5 MG PO TABS: 5.0000 mg | ORAL_TABLET | Freq: Every day | ORAL | 1 refills | Status: DC

## 2018-06-09 NOTE — Telephone Encounter (Signed)
Ok to fill x 30 days.  Patient needs appt scheduled.   Richardson Dopp, PA-C    06/09/2018 5:32 PM

## 2018-08-01 DIAGNOSIS — Z23 Encounter for immunization: Secondary | ICD-10-CM | POA: Diagnosis not present

## 2018-08-26 ENCOUNTER — Ambulatory Visit (HOSPITAL_COMMUNITY)
Admission: EM | Admit: 2018-08-26 | Discharge: 2018-08-26 | Disposition: A | Discharge status: Home / Self Care | Payer: 59 | Attending: Family Medicine | Admitting: Family Medicine

## 2018-08-26 ENCOUNTER — Encounter (HOSPITAL_COMMUNITY): Payer: Self-pay | Admitting: Emergency Medicine

## 2018-08-26 DIAGNOSIS — I10 Essential (primary) hypertension: Secondary | ICD-10-CM | POA: Diagnosis not present

## 2018-08-26 DIAGNOSIS — R197 Diarrhea, unspecified: Secondary | ICD-10-CM

## 2018-08-26 DIAGNOSIS — R000 Tachycardia, unspecified: Secondary | ICD-10-CM

## 2018-08-26 DIAGNOSIS — R112 Nausea with vomiting, unspecified: Secondary | ICD-10-CM | POA: Diagnosis not present

## 2018-08-26 DIAGNOSIS — R009 Unspecified abnormalities of heart beat: Secondary | ICD-10-CM

## 2018-08-26 DIAGNOSIS — R0602 Shortness of breath: Secondary | ICD-10-CM | POA: Diagnosis not present

## 2018-08-26 DIAGNOSIS — R531 Weakness: Secondary | ICD-10-CM

## 2018-08-26 NOTE — ED Notes (Signed)
EKG given to Dr. Mannie Stabile

## 2018-08-26 NOTE — ED Triage Notes (Signed)
Pt c/o weakness x2 days, states difficulty walking up stairs without getting out of breath. Pt is very hypertensive, states shes taking her BP meds.

## 2018-08-26 NOTE — Discharge Instructions (Signed)
Due to elevated heart rate, elevated blood pressure, shortness of breath and weakness, recommend go to the ER now for further evaluation.

## 2018-08-26 NOTE — ED Provider Notes (Addendum)
[facility]    CSN: [id number] Arrival date & time: 08/26/18  1721     History   Chief Complaint No chief complaint on file.   HPI [name] [name] is a 32 y.o. female.   32 year old female presents with weakness, fatigue, shortness of breath for the past 2 to 3 days. Started with nausea, vomiting and diarrhea 3 days ago. Stopped vomiting yesterday but still feels very weak. Having difficulty climbing stairs. Denies any distinct fever, dizziness, headache, nasal congestion, sore throat or cough. Denies any chest pain or difficulty urinating. Does have occasional abdominal cramping and decreased appetite. Tried to eat a salad and solid food this morning but felt nauseous. Has been keeping down water. Has not taken any medication for symptoms. Son also sick but with respiratory symptoms (RSV). No other family members with GI illness. Also has history of HTN and unable to take her blood pressure medication as scheduled the past 2 days.   The history is provided by the patient.    Past Medical History:  Diagnosis Date  . Condyloma acuminata   . HTN in pregnancy, chronic 09/24/2015   [place] labs: [x ]Cr, [x]  24hr protein (208) P:Cr 92 Plat: 207 AST/ALT: 10/10    . Hypertension   . Obesity in pregnancy    Antepartum, first trimester  . URI (upper respiratory infection)     Patient Active Problem List   Diagnosis Date Noted  . Hypertension 10/29/2016    Past Surgical History:  Procedure Laterality Date  . CESAREAN SECTION N/A 02/25/2016   Procedure: CESAREAN SECTION;  Surgeon: Truett Mainland, DO;  Location: [facility];  Service: Obstetrics;  Laterality: N/A;  . NO PAST SURGERIES      OB History    Gravida  1   Para  1   Term  1   Preterm      AB      Living  1     SAB      TAB      Ectopic      Multiple  0   Live Births  1            Home Medications    Prior to Admission medications   Medication Sig Start Date End Date  Taking? Authorizing Provider  amLODipine (NORVASC) 5 MG tablet Take 1 tablet (5 mg total) by mouth daily. 06/09/18 09/07/18  Richardson Dopp T, PA-C  lisinopril (PRINIVIL,ZESTRIL) 20 MG tablet Take 1 tablet (20 mg total) by mouth daily. 06/09/18 09/07/18  Liliane Shi, PA-C    Family History Family History  Problem Relation Age of Onset  . Hypertension Mother   . Heart disease Mother     Social History Social History   Tobacco Use  . Smoking status: Never Smoker  . Smokeless tobacco: Never Used  Substance Use Topics  . Alcohol use: No  . Drug use: No     Allergies   Patient has no known allergies.   Review of Systems Review of Systems  Constitutional: Positive for activity change, appetite change and fatigue. Negative for chills, diaphoresis and unexpected weight change.  HENT: Negative for congestion, ear discharge, ear pain, facial swelling, mouth sores, nosebleeds, postnasal drip, rhinorrhea, sinus pressure, sinus pain, sneezing, sore throat and trouble swallowing.   Eyes: Negative for photophobia and visual disturbance.  Respiratory: Positive for shortness of breath. Negative for cough, chest tightness and wheezing.   Cardiovascular: Positive for palpitations. Negative for chest  pain.  Gastrointestinal: Positive for abdominal pain (cramping), diarrhea, nausea and vomiting. Negative for blood in stool.  Genitourinary: Negative for decreased urine volume, difficulty urinating, dysuria and flank pain.  Musculoskeletal: Negative for arthralgias, back pain, myalgias, neck pain and neck stiffness.  Neurological: Positive for weakness. Negative for dizziness, tremors, seizures, syncope, light-headedness, numbness and headaches.  Hematological: Negative for adenopathy. Does not bruise/bleed easily.     Physical Exam Triage Vital Signs ED Triage Vitals  Enc Vitals Group     BP 08/26/18 1816 (!) 195/147     Pulse Rate 08/26/18 1816 (!) 108     Resp 08/26/18 1816 16     Temp  08/26/18 1816 99.3 F (37.4 C)     Temp src --      SpO2 08/26/18 1816 100 %     Weight --      Height --      Head Circumference --      Peak Flow --      Pain Score 08/26/18 1818 0     Pain Loc --      Pain Edu? --      Excl. in [place]? --    No data found.  Updated Vital Signs BP (!) 195/147   Pulse (!) 108   Temp 99.3 F (37.4 C)   Resp 16   SpO2 100%   Visual Acuity Right Eye Distance:   Left Eye Distance:   Bilateral Distance:    Right Eye Near:   Left Eye Near:    Bilateral Near:     Physical Exam  Constitutional: She is oriented to person, place, and time. She appears well-developed and well-nourished. She is cooperative. She appears ill. No distress.  Patient sitting comfortably on exam table in no acute distress but appears ill.   HENT:  Head: Normocephalic and atraumatic.  Right Ear: Hearing, tympanic membrane, external ear and ear canal normal.  Left Ear: Hearing, tympanic membrane, external ear and ear canal normal.  Nose: Nose normal. Right sinus exhibits no maxillary sinus tenderness and no frontal sinus tenderness. Left sinus exhibits no maxillary sinus tenderness and no frontal sinus tenderness.  Mouth/Throat: Uvula is midline and oropharynx is clear and moist. Mucous membranes are pale and dry.  Eyes: Conjunctivae and EOM are normal.  Neck: Normal range of motion. Neck supple.  Cardiovascular: Regular rhythm, normal heart sounds and normal pulses. Tachycardia present.  Pulmonary/Chest: Breath sounds normal. No tachypnea. No respiratory distress. She has no decreased breath sounds. She has no wheezes. She has no rhonchi. She has no rales.  Abdominal: Soft. Normal appearance. She exhibits no distension, no abdominal bruit and no mass. Bowel sounds are increased. There is no hepatosplenomegaly. There is generalized tenderness (mild). There is no rigidity, no rebound, no guarding and no CVA tenderness. No hernia.  Musculoskeletal: Normal range of motion.    Lymphadenopathy:    She has no cervical adenopathy.  Neurological: She is alert and oriented to person, place, and time.  Skin: Skin is warm, dry and intact. Capillary refill takes less than 2 seconds. No rash noted.  Psychiatric: She has a normal mood and affect. Her behavior is normal. Judgment and thought content normal.  Vitals reviewed.    UC Treatments / Results  Labs (all labs ordered are listed, but only abnormal results are displayed) Labs Reviewed - No data to display  EKG None  Radiology No results found.  Procedures ED EKG Date/Time: 08/26/2018 7:22 PM Performed by: Zoe Lan  Gwenlyn Found, NP Authorized by: Vanessa Kick, MD   ECG reviewed by ED Physician in the absence of a cardiologist: yes   Previous ECG:    Previous ECG:  Unavailable (no previous ECG to compare) Interpretation:    Interpretation: non-specific   Rate:    ECG rate:  102   ECG rate assessment: tachycardic   Rhythm:    Rhythm: sinus rhythm and sinus tachycardia   Conduction:    Conduction: normal   T waves:    T waves: non-specific   Comments:     Reviewed ECG findings with Dr. Mannie Stabile- no distinct abnormality that required emergent attention- results may be due to dehydration.    (including critical care time)  Medications Ordered in UC Medications - No data to display  Initial Impression / Assessment and Plan / UC Course  I have reviewed the triage vital signs and the nursing notes.  Pertinent labs & imaging results that were available during my care of the patient were reviewed by me and considered in my medical decision making (see chart for details).    Reviewed ECG findings and clinical findings with patient- discussed that she is probably dehydrated due to GI illness. Offered to give her Zofran and increase oral fluids but patient declined. Since her blood pressure is elevated and she continues to have shortness of breath and weakness, recommend go to the ER now for further  evaluation and probable IV fluid replacement. Patient understands and agrees to further evaluation at the ER now. Patient is stable and in no acute distress. Staff from Urgent Care walked patient over to ER since patient is not accompanied by any other family members.   Final Clinical Impressions(s) / UC Diagnoses   Final diagnoses:  Tachycardia  Elevated heart rate with elevated blood pressure and diagnosis of hypertension  Nausea, vomiting, and diarrhea  Weakness  Shortness of breath     Discharge Instructions     Due to elevated heart rate, elevated blood pressure, shortness of breath and weakness, recommend go to the ER now for further evaluation.     ED Prescriptions    None     Controlled Substance Prescriptions [place] Controlled Substance Registry consulted? Not Applicable   Katy Apo, NP 08/26/18 1927    Katy Apo, NP 08/26/18 2330

## 2018-08-29 ENCOUNTER — Inpatient Hospital Stay (HOSPITAL_COMMUNITY)
Admission: EM | Admit: 2018-08-29 | Discharge: 2018-08-31 | DRG: 392 | Disposition: A | Discharge status: Home / Self Care | Payer: 59 | Attending: Oncology | Admitting: Oncology

## 2018-08-29 ENCOUNTER — Other Ambulatory Visit: Payer: Self-pay

## 2018-08-29 ENCOUNTER — Encounter (HOSPITAL_COMMUNITY): Payer: Self-pay

## 2018-08-29 ENCOUNTER — Emergency Department (HOSPITAL_COMMUNITY): Payer: 59

## 2018-08-29 DIAGNOSIS — R1110 Vomiting, unspecified: Secondary | ICD-10-CM | POA: Diagnosis not present

## 2018-08-29 DIAGNOSIS — N3090 Cystitis, unspecified without hematuria: Secondary | ICD-10-CM

## 2018-08-29 DIAGNOSIS — R740 Nonspecific elevation of levels of transaminase and lactic acid dehydrogenase [LDH]: Secondary | ICD-10-CM | POA: Diagnosis not present

## 2018-08-29 DIAGNOSIS — A084 Viral intestinal infection, unspecified: Principal | ICD-10-CM | POA: Diagnosis present

## 2018-08-29 DIAGNOSIS — R32 Unspecified urinary incontinence: Secondary | ICD-10-CM

## 2018-08-29 DIAGNOSIS — I10 Essential (primary) hypertension: Secondary | ICD-10-CM | POA: Diagnosis not present

## 2018-08-29 DIAGNOSIS — R109 Unspecified abdominal pain: Secondary | ICD-10-CM | POA: Diagnosis present

## 2018-08-29 DIAGNOSIS — D509 Iron deficiency anemia, unspecified: Secondary | ICD-10-CM | POA: Diagnosis present

## 2018-08-29 DIAGNOSIS — Z79899 Other long term (current) drug therapy: Secondary | ICD-10-CM | POA: Diagnosis not present

## 2018-08-29 DIAGNOSIS — R809 Proteinuria, unspecified: Secondary | ICD-10-CM

## 2018-08-29 DIAGNOSIS — I160 Hypertensive urgency: Secondary | ICD-10-CM | POA: Diagnosis present

## 2018-08-29 DIAGNOSIS — K819 Cholecystitis, unspecified: Secondary | ICD-10-CM

## 2018-08-29 DIAGNOSIS — R000 Tachycardia, unspecified: Secondary | ICD-10-CM | POA: Diagnosis not present

## 2018-08-29 DIAGNOSIS — E876 Hypokalemia: Secondary | ICD-10-CM | POA: Diagnosis present

## 2018-08-29 DIAGNOSIS — Z8249 Family history of ischemic heart disease and other diseases of the circulatory system: Secondary | ICD-10-CM | POA: Diagnosis not present

## 2018-08-29 DIAGNOSIS — K529 Noninfective gastroenteritis and colitis, unspecified: Secondary | ICD-10-CM | POA: Diagnosis not present

## 2018-08-29 DIAGNOSIS — R1012 Left upper quadrant pain: Secondary | ICD-10-CM | POA: Diagnosis not present

## 2018-08-29 DIAGNOSIS — E860 Dehydration: Secondary | ICD-10-CM | POA: Diagnosis present

## 2018-08-29 DIAGNOSIS — R0602 Shortness of breath: Secondary | ICD-10-CM | POA: Diagnosis not present

## 2018-08-29 LAB — IRON AND TIBC
IRON: 18 ug/dL — AB (ref 28–170)
Saturation Ratios: 4 % — ABNORMAL LOW (ref 10.4–31.8)
TIBC: 447 ug/dL (ref 250–450)
UIBC: 429 ug/dL

## 2018-08-29 LAB — COMPREHENSIVE METABOLIC PANEL
ALT: 116 U/L — AB (ref 0–44)
AST: 69 U/L — AB (ref 15–41)
Albumin: 3.1 g/dL — ABNORMAL LOW (ref 3.5–5.0)
Alkaline Phosphatase: 45 U/L (ref 38–126)
Anion gap: 9 (ref 5–15)
BILIRUBIN TOTAL: 1.3 mg/dL — AB (ref 0.3–1.2)
BUN: 15 mg/dL (ref 6–20)
CHLORIDE: 105 mmol/L (ref 98–111)
CO2: 22 mmol/L (ref 22–32)
Calcium: 8.7 mg/dL — ABNORMAL LOW (ref 8.9–10.3)
Creatinine, Ser: 1.02 mg/dL — ABNORMAL HIGH (ref 0.44–1.00)
GFR calc Af Amer: >60 mL/min (ref >60)
Glucose, Bld: 114 mg/dL — ABNORMAL HIGH (ref 70–99)
Potassium: 3.7 mmol/L (ref 3.5–5.1)
Sodium: 136 mmol/L (ref 135–145)
TOTAL PROTEIN: 6.3 g/dL — AB (ref 6.5–8.1)

## 2018-08-29 LAB — CBC WITH DIFFERENTIAL/PLATELET
ABS IMMATURE GRANULOCYTES: 0.02 10*3/uL (ref 0.00–0.07)
BASOS PCT: 1 %
Basophils Absolute: 0.1 10*3/uL (ref 0.0–0.1)
Eosinophils Absolute: 0 10*3/uL (ref 0.0–0.5)
Eosinophils Relative: 1 %
HEMATOCRIT: 36.7 % (ref 36.0–46.0)
HEMOGLOBIN: 11 g/dL — AB (ref 12.0–15.0)
Immature Granulocytes: 0 %
Lymphocytes Relative: 25 %
Lymphs Abs: 2 10*3/uL (ref 0.7–4.0)
MCH: 23.2 pg — AB (ref 26.0–34.0)
MCHC: 30 g/dL (ref 30.0–36.0)
MCV: 77.3 fL — AB (ref 80.0–100.0)
MONO ABS: 0.6 10*3/uL (ref 0.1–1.0)
MONOS PCT: 7 %
NEUTROS ABS: 5.2 10*3/uL (ref 1.7–7.7)
Neutrophils Relative %: 66 %
PLATELETS: 283 10*3/uL (ref 150–400)
RBC: 4.75 MIL/uL (ref 3.87–5.11)
RDW: 14.1 % (ref 11.5–15.5)
WBC: 7.8 10*3/uL (ref 4.0–10.5)
nRBC: 0 % (ref 0.0–0.2)

## 2018-08-29 LAB — URINALYSIS, ROUTINE W REFLEX MICROSCOPIC
BACTERIA UA: NONE SEEN
BILIRUBIN URINE: NEGATIVE
GLUCOSE, UA: NEGATIVE mg/dL
Hgb urine dipstick: NEGATIVE
Ketones, ur: NEGATIVE mg/dL
Leukocytes, UA: NEGATIVE
NITRITE: NEGATIVE
PH: 6 (ref 5.0–8.0)
Protein, ur: 100 mg/dL — AB
Specific Gravity, Urine: >1.046 — ABNORMAL HIGH (ref 1.005–1.030)

## 2018-08-29 LAB — I-STAT BETA HCG BLOOD, ED (MC, WL, AP ONLY): I-stat hCG, quantitative: <5 m[IU]/mL (ref <5)

## 2018-08-29 LAB — TSH: TSH: 3.072 u[IU]/mL (ref 0.350–4.500)

## 2018-08-29 LAB — ACETAMINOPHEN LEVEL: Acetaminophen (Tylenol), Serum: <10 ug/mL — ABNORMAL LOW (ref 10–30)

## 2018-08-29 LAB — FERRITIN: Ferritin: 10 ng/mL — ABNORMAL LOW (ref 11–307)

## 2018-08-29 LAB — LIPASE, BLOOD: LIPASE: 30 U/L (ref 11–51)

## 2018-08-29 LAB — PROTEIN / CREATININE RATIO, URINE
Creatinine, Urine: 119.18 mg/dL
Protein Creatinine Ratio: 0.61 mg/mg{Cre} — ABNORMAL HIGH (ref 0.00–0.15)
Total Protein, Urine: 73 mg/dL

## 2018-08-29 LAB — I-STAT TROPONIN, ED: Troponin i, poc: 0 ng/mL (ref 0.00–0.08)

## 2018-08-29 MED ORDER — LABETALOL HCL 5 MG/ML IV SOLN
10.0000 mg | Freq: Four times a day (QID) | INTRAVENOUS | Status: DC | PRN
  Administered 2018-08-29 – 2018-08-30 (×2): 10 mg via INTRAVENOUS
  Filled 2018-08-29 (×4): qty 4

## 2018-08-29 MED ORDER — SODIUM CHLORIDE 0.9 % IV SOLN
INTRAVENOUS | Status: AC
  Administered 2018-08-29: 16:00:00 via INTRAVENOUS

## 2018-08-29 MED ORDER — AMLODIPINE BESYLATE 10 MG PO TABS
10.0000 mg | ORAL_TABLET | Freq: Every day | ORAL | Status: DC
  Administered 2018-08-30 – 2018-08-31 (×2): 10 mg via ORAL
  Filled 2018-08-29 (×2): qty 1

## 2018-08-29 MED ORDER — LISINOPRIL 20 MG PO TABS: 20.0000 mg | ORAL_TABLET | Freq: Every day | ORAL | Status: DC

## 2018-08-29 MED ORDER — FERROUS SULFATE 325 (65 FE) MG PO TABS
325.0000 mg | ORAL_TABLET | Freq: Every day | ORAL | Status: DC
  Administered 2018-08-30 – 2018-08-31 (×2): 325 mg via ORAL
  Filled 2018-08-29 (×2): qty 1

## 2018-08-29 MED ORDER — ENOXAPARIN SODIUM 40 MG/0.4ML ~~LOC~~ SOLN
40.0000 mg | SUBCUTANEOUS | Status: DC
  Administered 2018-08-30: 40 mg via SUBCUTANEOUS
  Filled 2018-08-29 (×3): qty 0.4

## 2018-08-29 MED ORDER — IOHEXOL 300 MG/ML  SOLN
100.0000 mL | Freq: Once | INTRAMUSCULAR | Status: AC | PRN
  Administered 2018-08-29: 100 mL via INTRAVENOUS

## 2018-08-29 MED ORDER — PROMETHAZINE HCL 25 MG PO TABS
12.5000 mg | ORAL_TABLET | Freq: Four times a day (QID) | ORAL | Status: DC | PRN
  Filled 2018-08-29: qty 1

## 2018-08-29 MED ORDER — METOPROLOL TARTRATE 5 MG/5ML IV SOLN
5.0000 mg | Freq: Once | INTRAVENOUS | Status: AC
  Administered 2018-08-29: 5 mg via INTRAVENOUS
  Filled 2018-08-29: qty 5

## 2018-08-29 MED ORDER — LISINOPRIL 20 MG PO TABS
40.0000 mg | ORAL_TABLET | Freq: Every day | ORAL | Status: DC
  Administered 2018-08-30 – 2018-08-31 (×2): 40 mg via ORAL
  Filled 2018-08-29 (×2): qty 2

## 2018-08-29 MED ORDER — HYDRALAZINE HCL 20 MG/ML IJ SOLN
5.0000 mg | Freq: Once | INTRAMUSCULAR | Status: AC
  Administered 2018-08-29: 5 mg via INTRAVENOUS
  Filled 2018-08-29: qty 1

## 2018-08-29 MED ORDER — AMLODIPINE BESYLATE 5 MG PO TABS
5.0000 mg | ORAL_TABLET | Freq: Once | ORAL | Status: AC
  Administered 2018-08-29: 5 mg via ORAL
  Filled 2018-08-29: qty 1

## 2018-08-29 MED ORDER — LISINOPRIL 20 MG PO TABS
20.0000 mg | ORAL_TABLET | Freq: Once | ORAL | Status: AC
  Administered 2018-08-29: 20 mg via ORAL
  Filled 2018-08-29: qty 1

## 2018-08-29 NOTE — ED Notes (Signed)
ED TO INPATIENT HANDOFF REPORT  Name/Age/Gender [name] [name] 32 y.o. female  Code Status    Code Status Orders  (From admission, onward)         Start     Ordered   08/29/18 1220  Full code  Continuous     08/29/18 1224        Code Status History    Date Active Date Inactive Code Status Order ID Comments User Context   02/22/2016 0723 02/26/2016 0046 Full Code [id number]  Christin Fudge, CNM Inpatient      Home/SNF/Other Home  Chief Complaint abd pain  Level of Care/Admitting Diagnosis ED Disposition    ED Disposition Condition [place]: [facility] [[id number]]  Level of Care: Telemetry [5]  Diagnosis: Abdominal pain [858850]  Admitting Physician: Annia Belt [[id number]]  Attending Physician: Annia Belt [[id number]]  Estimated length of stay: past midnight tomorrow  Certification:: I certify this patient will need inpatient services for at least 2 midnights  PT Class (Do Not Modify): Inpatient [101]  PT Acc Code (Do Not Modify): Private [1]       Medical History Past Medical History:  Diagnosis Date  . Condyloma acuminata   . HTN in pregnancy, chronic 09/24/2015   [place] labs: [x ]Cr, _0  24hr protein (208) P:Cr 92 Plat: 207 AST/ALT: 10/10    . Hypertension   . Obesity in pregnancy    Antepartum, first trimester  . URI (upper respiratory infection)     Allergies No Known Allergies  IV Location/Drains/Wounds Patient Lines/Drains/Airways Status   Active Line/Drains/Airways    Name:   Placement date:   Placement time:   Site:   Days:   Peripheral IV 08/29/18 Left Antecubital   08/29/18    0825    Antecubital   less than 1   Incision (Closed) 02/25/16 Abdomen   02/25/16    2330     916          Labs/Imaging Results for orders placed or performed during the hospital encounter of 08/29/18 (from the past 48 hour(s))  Comprehensive metabolic panel     Status: Abnormal   Collection Time:  08/29/18  8:30 AM  Result Value Ref Range   Sodium 136 135 - 145 mmol/L   Potassium 3.7 3.5 - 5.1 mmol/L   Chloride 105 98 - 111 mmol/L   CO2 22 22 - 32 mmol/L   Glucose, Bld 114 (H) 70 - 99 mg/dL   BUN 15 6 - 20 mg/dL   Creatinine, Ser 1.02 (H) 0.44 - 1.00 mg/dL   Calcium 8.7 (L) 8.9 - 10.3 mg/dL   Total Protein 6.3 (L) 6.5 - 8.1 g/dL   Albumin 3.1 (L) 3.5 - 5.0 g/dL   AST 69 (H) 15 - 41 U/L   ALT 116 (H) 0 - 44 U/L   Alkaline Phosphatase 45 38 - 126 U/L   Total Bilirubin 1.3 (H) 0.3 - 1.2 mg/dL   GFR calc non Af Amer >60 >60 mL/min   GFR calc Af Amer >60 >60 mL/min    Comment: (NOTE) The eGFR has been calculated using the CKD EPI equation. This calculation has not been validated in all clinical situations. eGFR's persistently <60 mL/min signify possible Chronic Kidney Disease.    Anion gap 9 5 - 15    Comment: Performed at [facility] [address] [place], [place] [address]  Lipase, blood     Status: None  Collection Time: 08/29/18  8:30 AM  Result Value Ref Range   Lipase 30 11 - 51 U/L    Comment: Performed at [facility], [place] [address] [place], [place] [address]  CBC with Differential/Platelet     Status: Abnormal   Collection Time: 08/29/18  8:30 AM  Result Value Ref Range   WBC 7.8 4.0 - 10.5 K/uL   RBC 4.75 3.87 - 5.11 MIL/uL   Hemoglobin 11.0 (L) 12.0 - 15.0 g/dL   HCT 36.7 36.0 - 46.0 %   MCV 77.3 (L) 80.0 - 100.0 fL   MCH 23.2 (L) 26.0 - 34.0 pg   MCHC 30.0 30.0 - 36.0 g/dL   RDW 14.1 11.5 - 15.5 %   Platelets 283 150 - 400 K/uL   nRBC 0.0 0.0 - 0.2 %   Neutrophils Relative % 66 %   Neutro Abs 5.2 1.7 - 7.7 K/uL   Lymphocytes Relative 25 %   Lymphs Abs 2.0 0.7 - 4.0 K/uL   Monocytes Relative 7 %   Monocytes Absolute 0.6 0.1 - 1.0 K/uL   Eosinophils Relative 1 %   Eosinophils Absolute 0.0 0.0 - 0.5 K/uL   Basophils Relative 1 %   Basophils Absolute 0.1 0.0 - 0.1 K/uL   Immature Granulocytes 0 %   Abs Immature Granulocytes 0.02 0.00  - 0.07 K/uL    Comment: Performed at [facility] [address] [place], [place] [address]  I-stat troponin, ED     Status: None   Collection Time: 08/29/18  8:39 AM  Result Value Ref Range   Troponin i, poc 0.00 0.00 - 0.08 ng/mL   Comment 3            Comment: Due to the release kinetics of cTnI, a negative result within the first hours of the onset of symptoms does not rule out myocardial infarction with certainty. If myocardial infarction is still suspected, repeat the test at appropriate intervals.   I-Stat Beta hCG blood, ED ([facility], [facility], [facility] only)     Status: None   Collection Time: 08/29/18  8:39 AM  Result Value Ref Range   I-stat hCG, quantitative <5.0 <5 mIU/mL   Comment 3            Comment:   GEST. AGE      CONC.  (mIU/mL)   <=1 WEEK        5 - 50     2 WEEKS       50 - 500     3 WEEKS       100 - 10,000     4 WEEKS     1,000 - 30,000        FEMALE AND NON-PREGNANT FEMALE:     LESS THAN 5 mIU/mL   TSH     Status: None   Collection Time: 08/29/18  8:44 AM  Result Value Ref Range   TSH 3.072 0.350 - 4.500 uIU/mL    Comment: Performed by a 3rd Generation assay with a functional sensitivity of <=0.01 uIU/mL. Performed at [facility], [place] [address], [place], [place] [address]   Urinalysis, Routine w reflex microscopic     Status: Abnormal   Collection Time: 08/29/18 11:20 AM  Result Value Ref Range   Color, Urine YELLOW YELLOW   APPearance CLEAR CLEAR   Specific Gravity, Urine >1.046 (H) 1.005 - 1.030   pH 6.0 5.0 - 8.0   Glucose, UA NEGATIVE  NEGATIVE mg/dL   Hgb urine dipstick NEGATIVE NEGATIVE   Bilirubin Urine NEGATIVE NEGATIVE   Ketones, ur NEGATIVE NEGATIVE mg/dL   Protein, ur 100 (A) NEGATIVE mg/dL   Nitrite NEGATIVE NEGATIVE   Leukocytes, UA NEGATIVE NEGATIVE   RBC / HPF 0-5 0 - 5 RBC/hpf   WBC, UA 0-5 0 - 5 WBC/hpf   Bacteria, UA NONE SEEN NONE SEEN   Squamous Epithelial / LPF 0-5 0 - 5    Comment: Performed at [facility],  [place] [address] [place], [place] [address]  Acetaminophen level     Status: Abnormal   Collection Time: 08/29/18  1:25 PM  Result Value Ref Range   Acetaminophen (Tylenol), Serum <10 (L) 10 - 30 ug/mL    Comment: (NOTE) Therapeutic concentrations vary significantly. A range of 10-30 ug/mL  may be an effective concentration for many patients. However, some  are best treated at concentrations outside of this range. Acetaminophen concentrations >150 ug/mL at 4 hours after ingestion  and >50 ug/mL at 12 hours after ingestion are often associated with  toxic reactions. Performed at [facility], [place] [address], [place], [place] [address]   Ferritin     Status: Abnormal   Collection Time: 08/29/18  1:25 PM  Result Value Ref Range   Ferritin 10 (L) 11 - 307 ng/mL    Comment: Performed at [facility], [place] [address] [place], [place] [address]  Iron and TIBC     Status: Abnormal   Collection Time: 08/29/18  1:25 PM  Result Value Ref Range   Iron 18 (L) 28 - 170 ug/dL   TIBC 447 250 - 450 ug/dL   Saturation Ratios 4 (L) 10.4 - 31.8 %   UIBC 429 ug/dL    Comment: Performed at [facility], [place] [address], [place], [place] [address]   Ct Abdomen Pelvis W Contrast  Result Date: 08/29/2018 CLINICAL DATA:  Left upper quadrant pain, nausea/vomiting EXAM: CT ABDOMEN AND PELVIS WITH CONTRAST TECHNIQUE: Multidetector CT imaging of the abdomen and pelvis was performed using the standard protocol following bolus administration of intravenous contrast. CONTRAST:  179m OMNIPAQUE IOHEXOL 300 MG/ML  SOLN COMPARISON:  None. FINDINGS: Lower chest: Cardiomegaly with small pericardial effusion, incompletely visualized. Small right pleural effusion. Hepatobiliary: Liver is within normal limits. Gallbladder wall thickening/edema with possible pericholecystic fluid. Gallbladder is not distended. No intrahepatic or extrahepatic ductal dilatation. Pancreas: Within normal limits. Spleen: Within normal  limits. Adrenals/Urinary Tract: Adrenal glands are within normal limits. Kidneys are within normal limits, noting mild perinephric fluid anterior to the right kidney (series 3/image 38). No hydronephrosis. Mildly thick-walled bladder with perivesical stranding (series 3/image 78). Stomach/Bowel: Stomach is notable for a tiny hiatal hernia. No evidence of bowel obstruction. Normal appendix (series 3/image 56). Vascular/Lymphatic: No evidence of abdominal aortic aneurysm. No suspicious abdominopelvic lymphadenopathy. Reproductive: Uterus is grossly unremarkable. Right ovary is within normal limits. 4.3 cm left ovarian cyst, simple. In a premenopausal patient, this is likely physiologic, and follow-up is not required. Other: Mild pelvic fluid, likely physiologic. Musculoskeletal: Visualized osseous structures are within normal limits. IMPRESSION: Mildly thick-walled bladder with perivesical stranding. Correlate for cystitis. Gallbladder wall thickening/edema with possible pericholecystic fluid. If there is clinical concern for acute cholecystitis, consider right upper quadrant ultrasound for further evaluation. In the absence of gallbladder distension, this may also be secondary to hepatic inflammation. Cardiomegaly with small pericardial effusion. Small right pleural effusion. Additional ancillary findings as above. Electronically Signed  By: Julian Hy M.D.   On: 08/29/2018 10:42    Pending Labs Unresulted Labs (From admission, onward)    Start     Ordered   08/30/18 0500  Aldosterone + renin activity w/ ratio  Tomorrow morning,   R    Comments:  Please obtain lab early in the morning (5-7AM) with the patient in a seated position.    08/29/18 1308   08/29/18 1245  Protein / creatinine ratio, urine  Once,   R     08/29/18 1250   08/29/18 1233  Hepatitis panel, acute  Add-on,   R     08/29/18 1233   08/29/18 1233  Hepatitis B surface antibody  Add-on,   R     08/29/18 1233   08/29/18 1233   Hepatitis B surface antigen  Add-on,   R     08/29/18 1233   08/29/18 1219  HIV antibody (Routine Testing)  Once,   R     08/29/18 1224          Vitals/Pain Today's Vitals   08/29/18 1100 08/29/18 1143 08/29/18 1320 08/29/18 1413  BP: (!) 181/140 (!) 174/134 (!) 164/126 (!) 176/128  Pulse: 92 91  89  Resp: 17 20  (!) 28  Temp:      TempSrc:      SpO2: 100% 98%  100%  Weight:      Height:      PainSc:        Isolation Precautions No active isolations  Medications Medications  enoxaparin (LOVENOX) injection 40 mg (has no administration in time range)  promethazine (PHENERGAN) tablet 12.5 mg (has no administration in time range)  amLODipine (NORVASC) tablet 10 mg (has no administration in time range)  0.9 %  sodium chloride infusion (has no administration in time range)  lisinopril (PRINIVIL,ZESTRIL) tablet 40 mg (has no administration in time range)  metoprolol tartrate (LOPRESSOR) injection 5 mg (5 mg Intravenous Given 08/29/18 0827)  lisinopril (PRINIVIL,ZESTRIL) tablet 20 mg (20 mg Oral Given 08/29/18 0852)  amLODipine (NORVASC) tablet 5 mg (5 mg Oral Given 08/29/18 0852)  iohexol (OMNIPAQUE) 300 MG/ML solution 100 mL (100 mLs Intravenous Contrast Given 08/29/18 1012)  hydrALAZINE (APRESOLINE) injection 5 mg (5 mg Intravenous Given 08/29/18 1320)    Mobility walks

## 2018-08-29 NOTE — ED Triage Notes (Signed)
Pt states that she has been having left to mid upper abdominal pain X6 days. Pt reports decreased pain when lying on her side. Pt denies blood in stool, one episode of vomiting last night. Pt also c/o SOB since Monday, denies cough.

## 2018-08-29 NOTE — H&P (Addendum)
Date: 08/29/2018               Patient Name:  [name] [name] MRN: [id number]  DOB: 12/19/1985 Age / Sex: 32 y.o., female   PCP: Fanny Bien, MD         Medical Service: Internal Medicine Teaching Service         Attending Physician: Dr. Orlie Dakin, MD    First Contact: Dr. Sharon Seller Pager: [phone]  Second Contact: Dr. Frederico Hamman Pager: [phone]       After Hours (After 5p/  First Contact Pager: [phone]  weekends / holidays): Second Contact Pager: [phone]   Chief Complaint: abdominal pain  History of Present Illness:  [name] [name] is a 32yo female with PMH of hypertension diagnosed at age 31 presenting with abdominal pain, nausea, and vomiting that began suddenly one week ago. She states this has never happened before. The pain is in the LUQ and is not associated with a certain time of day or with eating but seems to occur randomly. She has had no recent changes in diet or medications. She takes excedrin for headaches but only 1-2 times per week. She states the only thing that helps is lying on [name] [name] right side and shortly after having a BM. She states she has had constipation but [name] [name] BM are "like shredded material" and of abnormal consistency. She states the color is dark but not tarry or like coffee grounds. She states emesis has been yellow in color. She has had limited po intake due to nausea but requests water currently. She denies recent illness or sick contacts besides [name] [name] young son who has a cold. She denies burning in [name] [name] chest, dysuria, or recent changes in urination. She has a history of urinary incontinence since having [name] [name] son in 2017. She presented to urgent care for [name] [name] symptoms on 11/14. She was not given any medications at that time, but it was suggested she come into the ED.  She has a history of hypertension since she was 32 years old and states she was not previously worked up for alternate etiology. She takes lisinopril and amlodipine but states this does not  really seem to lower [name] [name] blood pressure much. The only other medication she has been on was labetalol during [name] [name] pregnancy. She has a family history of hypertension but no one else was diagnosed at a young age.     Meds:  Current Meds  Medication Sig  . amLODipine (NORVASC) 5 MG tablet Take 1 tablet (5 mg total) by mouth daily.  Marland Kitchen aspirin-acetaminophen-caffeine (EXCEDRIN MIGRAINE) 250-250-65 MG tablet Take 2 tablets by mouth every 6 (six) hours as needed for headache.  . etonogestrel (NEXPLANON) 68 MG IMPL implant 1 each by Subdermal route once.  Marland Kitchen lisinopril (PRINIVIL,ZESTRIL) 20 MG tablet Take 1 tablet (20 mg total) by mouth daily.     Allergies: Allergies as of 08/29/2018  . (No Known Allergies)   Past Medical History:  Diagnosis Date  . Condyloma acuminata   . HTN in pregnancy, chronic 09/24/2015   [place] labs: [x ]Cr, [x]  24hr protein (208) P:Cr 92 Plat: 207 AST/ALT: 10/10    . Hypertension   . Obesity in pregnancy    Antepartum, first trimester  . URI (upper respiratory infection)     Family History:  Mother: HTN, deceased from MI Father: HTN  Social History:  She denies smoking, drinking alcohol, or recreational drug use.  She has a son and is accompanied by [name] [name] mother  in law.   Review of Systems: A complete ROS was negative except as per HPI.   Physical Exam: Blood pressure (!) 181/140, pulse 92, temperature 97.8 F (36.6 C), temperature source Oral, resp. rate 17, height 5\' 3"  (1.6 m), weight 103 kg, SpO2 100 %.  Physical Exam:  Constitution: NAD, well-groomed, appears stated age, lying supine in bed  HEENT: no scleral icterus, color correct contact lenses Cardio: tachycardic, regular rhythm, no m/r/g Respiratory: CTAB, no wheezing or rales Abdominal: +BS, non-distended, soft, TTP LUQ/epigastric region, no rebound tenderness, no splenomegaly or hepatomegaly,  MSK: moving all extremities  Neuro: a&o, normal affect, cooperative Skin: c/d/i, no edema     EKG: personally reviewed my interpretation is sinus tachycardia, left axis deviation, no qtc prolongation  CT 08/29/2018 IMPRESSION: Mildly thick-walled bladder with perivesical stranding. Correlate for cystitis.  Gallbladder wall thickening/edema with possible pericholecystic fluid. If there is clinical concern for acute cholecystitis, consider right upper quadrant ultrasound for further evaluation. In the absence of gallbladder distension, this may also be secondary to hepatic inflammation.  Cardiomegaly with small pericardial effusion. Small right pleural effusion.  Additional ancillary findings as above. Electronically Signed   By: Julian Hy M.D.   On: 08/29/2018 10:42   Assessment & Plan by Problem: Active Problems:   * No active hospital problems. *   32yo female with PMH of hypertension diagnosed at age 43 presenting with abdominal pain, nausea, and vomiting that began suddenly one week ago found to have transaminitis with cholecystic and bladder inflammation on CT.    Abdominal Pain with associated Nausea Transaminitis  LUQ/epigastric pain beginning one week ago with changes in BM and vomiting. In the ED Trops, betaHcg, TSH both wnl. No fever, leukocytosis or pain associated with eating although she has had decreased po intake due to n/v, small creatinine bump, and increase in specific gravity. PE with mild TTP and no guarding. Transaminitis present with hypoalbuminemia. CT scan showed inflammation along the gallbladder w/o cholelithasis or obvious cholecystitis. Also inflammation along the bladder. She has no symptoms of UTI or changes in urination, UA significant for proteinuria and inc in sp gravity. She also takes excedrin for HA but only takes 2 times per week. Symptoms seem likely secondary to viral enteritis and dehydration but cannot rule out acalculous cholecystitis. Transaminitis may also be related to NAFLD in a young woman with BMI of 40.   -  acetaminophen level  - hepatitis panel, hep B surface ag - am BMP, Mg - NaCl 100 cc/hr - 1 L total - CLD, advance diet slowly - phenergan 12.5 mg q6h prn - admit to telemetry   Hypertensive Urgency Hypertension  Blood pressure 219/154 at admission. Initially received metop IV 5mg  and norvasc 5mg . She did not take [name] [name] bp medications this morning.  She has a long history of HTN since age 19 which has not been worked up for secondary etiology. She takes lisinopril and amlodipine but states these medications do not seem to help [name] [name] BP much. Previously she took labetalol during [name] [name] pregnancy in 2017. UA significant for proteinuria. As diagnosis was at age 89, symptoms seem likely to be secondary hypertension although she currently has multiple risk factors. We will obtain adosterone renin ratios as this is most common cause of secondary hypertension, but she may need further workup outpatient with [name] [name] PCP.   - increase norvasc from 5 to 10 mg - cont. Lisinopril 20 mg qd - inc to 40 mg tomorrow - am aldosterone +  renin activity with ratio  - urine protein/cr ratio - hydral 5 mg for systolic >080  Anemia Hbg 11. Baseline unknown as most previous labs were during pregnancy.   - ferritin, Iron, TIBC  Diet: CLD VTE: lovenox IVF: none Code: full  Dispo: Admit patient to Inpatient with expected length of stay greater than 2 midnights.  SignedMarty Heck, DO 08/29/2018, 11:46 AM  Pager: [phone]

## 2018-08-29 NOTE — ED Notes (Signed)
Pt has not had home BP medications today, reports taking yesterday.

## 2018-08-29 NOTE — ED Provider Notes (Signed)
[facility] EMERGENCY DEPARTMENT Provider Note   CSN: [id number] Arrival date & time: 08/29/18  0756     History   Chief Complaint Chief Complaint  Patient presents with  . Abdominal Pain    HPI [name] [name] is a 32 y.o. female.  HPI of left upper quadrant abdominal pain onset 6 days ago discomfort is constant.  She treated herself with Excedrin with partial relief pain is not made worse by anything is improved somewhat by lying in the right lateral decubitus position.  She vomited once yesterday.  Her last bowel movement was 2 days ago no blood per rectum.  Other associated symptoms include shortness of breath with exertion such as walking up steps for the past 6 days.  Patient was seen at [facility] urgent care center on 08/26/2018 for same complaint.  At that time she was markedly hypertensive with a blood pressure 195/147 and pulse of 108.  She was advised to come to the emergency department patient did not take her blood pressure medication this morning, though she had taken it on 08/26/2018 she is not nauseated now no fever.  No urinary symptoms.  No other associated symptoms  Past Medical History:  Diagnosis Date  . Condyloma acuminata   . HTN in pregnancy, chronic 09/24/2015   [place] labs: [x ]Cr, [x]  24hr protein (208) P:Cr 92 Plat: 207 AST/ALT: 10/10    . Hypertension   . Obesity in pregnancy    Antepartum, first trimester  . URI (upper respiratory infection)     Patient Active Problem List   Diagnosis Date Noted  . Hypertension 10/29/2016    Past Surgical History:  Procedure Laterality Date  . CESAREAN SECTION N/A 02/25/2016   Procedure: CESAREAN SECTION;  Surgeon: Truett Mainland, DO;  Location: [facility];  Service: Obstetrics;  Laterality: N/A;  . NO PAST SURGERIES       OB History    Gravida  1   Para  1   Term  1   Preterm      AB      Living  1     SAB      TAB      Ectopic      Multiple  0   Live Births    1            Home Medications    Prior to Admission medications   Medication Sig Start Date End Date Taking? Authorizing Provider  amLODipine (NORVASC) 5 MG tablet Take 1 tablet (5 mg total) by mouth daily. 06/09/18 09/07/18  Richardson Dopp T, PA-C  lisinopril (PRINIVIL,ZESTRIL) 20 MG tablet Take 1 tablet (20 mg total) by mouth daily. 06/09/18 09/07/18  Liliane Shi, PA-C    Family History Family History  Problem Relation Age of Onset  . Hypertension Mother   . Heart disease Mother     Social History Social History   Tobacco Use  . Smoking status: Never Smoker  . Smokeless tobacco: Never Used  Substance Use Topics  . Alcohol use: No  . Drug use: No     Allergies   Patient has no known allergies.   Review of Systems Review of Systems  Constitutional: Negative.   HENT: Negative.   Respiratory: Positive for shortness of breath.   Cardiovascular: Negative.   Gastrointestinal: Positive for abdominal pain and vomiting.  Musculoskeletal: Negative.   Skin: Negative.   Neurological: Negative.   Psychiatric/Behavioral: Negative.   All other systems  reviewed and are negative.    Physical Exam Updated Vital Signs BP (!) 219/154 (BP Location: Right Arm)   Pulse (!) 110   Temp 97.8 F (36.6 C) (Oral)   Resp 20   Ht 5\' 3"  (1.6 m)   Wt 103 kg   SpO2 100%   BMI 40.21 kg/m   Physical Exam  Constitutional: She is oriented to person, place, and time. She appears well-developed and well-nourished.  HENT:  Head: Normocephalic and atraumatic.  Eyes: Pupils are equal, round, and reactive to light. Conjunctivae are normal.  Neck: Neck supple. No tracheal deviation present. No thyromegaly present.  Cardiovascular: Regular rhythm.  No murmur heard. Tachycardic  Pulmonary/Chest: Effort normal and breath sounds normal.  Abdominal: Soft. Bowel sounds are normal. She exhibits no distension and no mass. There is tenderness. There is no guarding.  Obese, minimally tender  at left upper quadrant  Musculoskeletal: Normal range of motion. She exhibits no edema or tenderness.  Neurological: She is alert and oriented to person, place, and time. Coordination normal.  Skin: Skin is warm and dry. No rash noted.  Psychiatric: She has a normal mood and affect.  Nursing note and vitals reviewed.    ED Treatments / Results  Labs (all labs ordered are listed, but only abnormal results are displayed) Labs Reviewed  COMPREHENSIVE METABOLIC PANEL  LIPASE, BLOOD  CBC WITH DIFFERENTIAL/PLATELET  I-STAT TROPONIN, ED  I-STAT BETA HCG BLOOD, ED ([facility], [facility], [facility] ONLY)    EKG EKG Interpretation  Date/Time:  Sunday August 29 2018 08:11:09 EST Ventricular Rate:  111 PR Interval:    QRS Duration: 83 QT Interval:  367 QTC Calculation: 499 R Axis:   32 Text Interpretation:  Sinus tachycardia Left atrial enlargement Borderline T wave abnormalities Prolonged QT interval Baseline wander in lead(s) I II III aVR aVF V1 V2 V4 No significant change since last tracing Confirmed by Orlie Dakin [phone]) on 08/29/2018 8:35:05 AM   Radiology No results found.  Procedures Procedures (including critical care time)  Medications Ordered in ED Medications  metoprolol tartrate (LOPRESSOR) injection 5 mg (has no administration in time range)   Declines pain medicine.  8:55 AM patient resting comfortably after treatment with intravenous Lopressor.  She is no longer tachycardic. 11:20 AM pain is improved patient is resting comfortably.  She still has some left upper quadrant pain.  Denies any right upper quadrant pain on reexamination she is mildly tender at left upper quadrant.  No right upper quadrant tenderness.  Doubt acute cholecystitis.  Blood pressure remains markedly elevated after treatment with oral and intravenous antihypertensive medication. Initial Impression / Assessment and Plan / ED Course  I have reviewed the triage vital signs and the nursing notes.  Pertinent labs  & imaging results that were available during my care of the patient were reviewed by me and considered in my medical decision making (see chart for details).   Lab work consistent with mildly elevated transaminases and hyperbilirubinbilirubinemia.  I am not convinced the patient has acute cholecystitis.  She has no right upper quadrant pain or tenderness. Consulted the internal medicine resident physician to arrange for overnight stay, as patient's blood pressure is poorly controlled after intravenous and oral antihypertensive medication Final Clinical Impressions(s) / ED Diagnoses  Diagnoses #1 hypertensive urgency #2 left upper quadrant abdominal pain CRITICAL CARE Performed by: Orlie Dakin Total critical care time: 35 minutes Critical care time was exclusive of separately billable procedures and treating other patients. Critical care was necessary to  treat or prevent imminent or life-threatening deterioration. Critical care was time spent personally by me on the following activities: development of treatment plan with patient and/or surrogate as well as nursing, discussions with consultants, evaluation of patient's response to treatment, examination of patient, obtaining history from patient or surrogate, ordering and performing treatments and interventions, ordering and review of laboratory studies, ordering and review of radiographic studies, pulse oximetry and re-evaluation of patient's condition. Final diagnoses:  None    ED Discharge Orders    None       Orlie Dakin, MD 08/29/18 1157

## 2018-08-29 NOTE — Discharge Summary (Addendum)
Name: [name] [name] MRN: [id number] DOB: [birth year] 32 y.o. PCP: Fanny Bien, MD  Date of Admission: 08/29/2018  7:58 AM Date of Discharge: 08/31/2018 Attending Physician: Annia Belt, MD  Discharge Diagnosis: 1. Gastroenteritis 2. Hypertensive Urgency  3. Iron Deficiency Anemia  Discharge Medications: Allergies as of 08/31/2018   No Known Allergies     Medication List    STOP taking these medications   aspirin-acetaminophen-caffeine 250-250-65 MG tablet Commonly known as:  EXCEDRIN MIGRAINE     TAKE these medications   amLODipine 10 MG tablet Commonly known as:  NORVASC Take 1 tablet (10 mg total) by mouth daily. What changed:    medication strength  how much to take   chlorthalidone 25 MG tablet Commonly known as:  HYGROTON Take 1 tablet (25 mg total) by mouth daily.   etonogestrel 68 MG Impl implant Commonly known as:  NEXPLANON 1 each by Subdermal route once.   ferrous sulfate 325 (65 FE) MG tablet Take 1 tablet (325 mg total) by mouth daily with breakfast.   lisinopril 40 MG tablet Commonly known as:  PRINIVIL,ZESTRIL Take 1 tablet (40 mg total) by mouth daily. What changed:    medication strength  how much to take   polyethylene glycol packet Commonly known as:  MIRALAX / GLYCOLAX Take 17 g by mouth daily as needed.       Disposition and follow-up:   Ms.[name] [name] was discharged from [facility] in Stable condition.  At the hospital follow up visit please address:  1.  Gastroenteritis: one week of abdominal pain, nausea, and vomiting which resolved with fluids and antinausea medication. Likely viral GI illness. Inflammation of gallbladder and bladder seen on CT with no specific concerning findings Hypertension: BP 195/147 at admission. Hypertension dx at age 86. States she has had no previous work-up for secondary HTN but do not have all previous notes and labs. Had difficulty taking bp meds prior to  admission due to n/v. UA significant for proteinuria Restarted medications and then increased to lisinopril 40mg  qd, norvasc 10mg  qd, and started chlorthalidone 25 mg qd after blood pressure only decreased down to 155/119. Labs for secondary hypertension workup still pending, renal US negative for renal artery stenosis.   Iron Deficiency Anemia: ferrous sulfate started   2.  Labs / imaging needed at time of follow-up: UA, BMP  3.  Pending labs/ test needing follow-up: renin/aldosterone ratio, 24hr urine creatinine and protein, metanephrines  Follow-up Appointments: Follow-up Information    Fanny Bien, MD.   Specialty:  Family Medicine Contact information: [address] [place] [address] [phone]         Appointment Time: 09/03/18 at [facility] by problem list: Gastroenteritis  32yo female with PMH of hypertension diagnosed at age 41 presenting with abdominal pain, nausea, and vomiting that began suddenly one week ago found to have mild transaminitis with nonspecific cholecystic and bladder inflammation on CT. Hepatitis C, A, and B antibodies and Hep B surface Ag negative. She was placed on clear liquid diet. Her symptoms were likely due to viral gastroenteritis, and transaminitis and symptoms resolved with fluid resuscitation. She was advanced to a regular diet with no recurrence of symptoms and close follow-up with PCP.  Hypertensive Urgency She was 195/147 at admission and has a history of hypertension diagnosed at age 71 with no known workup for secondary hypertension. Her drastically elevated blood pressure at admission was likely rebound hypertension as she  had been unable to take her blood pressure medications consistently due to nausea and vomiting. She stated when she checked it at home it remained in the 160s/100s even with her medications. Workup was done for secondary sources with morning renin/aldosterone ratio, metanephrines, 24 hr urine cr and  protein still pending. Renal US showed no renal arterial stenosis. Over the course of her hospital stay she was titrated up to lisinopril 40 mg qd, norvasc 10 mg qd and was started on chlorthalidone 25 mg and was discharged with these medications.  Iron Deficiency Anemia Hemoglobin 11 at admission with microcytic anemia seen on previous labs. Ferritin 10, Iron 18. She was discharged with ferrous sulfate 325 mg qd.    Discharge Vitals:   BP (!) 181/137 (BP Location: Right Arm)   Pulse 93   Temp 98.2 F (36.8 C) (Oral)   Resp (!) 29   Ht 5\' 3"  (1.6 m)   Wt 106.1 kg   SpO2 100%   BMI 41.43 kg/m   Pertinent Labs, Studies, and Procedures:   UA Protein: 100 Specific Gravity >1.046 All other values negative   UPC Ratio Total urine protein: 73 Urine creatinine: 119.18 Protein/Creatinine Ratio: .61   11/19 Renal US Right: Normal size right kidney. Normal right Resisitive Index.    1-59% stenosis of the right renal artery. Left: Normal size of left kidney. Normal left Resistive Index.    1-59% stenosis of the left renal artery.  Iron: 18 UIBC: 429 TIBC: 447 Ferritin: 10  08/30/18 CT Abdomen w/contrast IMPRESSION: Mildly thick-walled bladder with perivesical stranding. Correlate for cystitis.  Gallbladder wall thickening/edema with possible pericholecystic fluid. If there is clinical concern for acute cholecystitis, consider right upper quadrant ultrasound for further evaluation. In the absence of gallbladder distension, this may also be secondary to hepatic inflammation.  Cardiomegaly with small pericardial effusion. Small right pleural effusion. Additional ancillary findings as above. Electronically Signed   By: Julian Hy M.D.   On: 08/29/2018 10:42  Discharge Instructions: Discharge Instructions    Call MD for:  difficulty breathing, headache or visual disturbances   Complete by:  As directed    Diet - low sodium heart healthy   Complete by:   As directed    Diet - low sodium heart healthy   Complete by:  As directed    Discharge instructions   Complete by:  As directed    You were hospitalized for dehydration and gastroenteritis. You were also found to have hypertensive urgency and an iron deficiency. Thank you for allowing [place] to be part of your care.   We arranged for you to follow up at:   Dr. Rachell Cipro, MD AT: Friday, November 22nd at [address], [place] [address] [phone] Please call to reschedule if this appointment time does not work for you.    Please note these changes made to your medications:   Please START taking:   Ferrous Sulfate 325 mg tablet once per day. This is to treat your iron deficiency anemia.   Chlorthalidone (Hygroton) 25 mg tablet once per day in the morning for your high blood pressure  Your Norvasc (amlodipine) has been increased from 5 mg to 10 mg tablet once per day Your lisinopril (PRINIVIL) has been increased from 20 mg to 40 mg tablet once per day.   Please check your blood pressure 2-3 times per day to keep track of how your new blood pressure medications are working  and bring this information with you to your appointment with Dr Ernie Hew.   Polyethylene glycol (MIRALAX) as needed for constipation.   Increase activity slowly   Complete by:  As directed    Increase activity slowly   Complete by:  As directed       Signed: Molli Hazard A, DO 08/31/2018, 2:10 PM   Pager: [phone]

## 2018-08-29 NOTE — ED Notes (Signed)
Pt transported to CT ?

## 2018-08-30 ENCOUNTER — Inpatient Hospital Stay (HOSPITAL_COMMUNITY): Payer: 59

## 2018-08-30 ENCOUNTER — Encounter (HOSPITAL_COMMUNITY): Payer: Self-pay | Admitting: General Practice

## 2018-08-30 DIAGNOSIS — I10 Essential (primary) hypertension: Secondary | ICD-10-CM

## 2018-08-30 LAB — CBC
HCT: 33.4 % — ABNORMAL LOW (ref 36.0–46.0)
Hemoglobin: 9.9 g/dL — ABNORMAL LOW (ref 12.0–15.0)
MCH: 22.8 pg — AB (ref 26.0–34.0)
MCHC: 29.6 g/dL — AB (ref 30.0–36.0)
MCV: 76.8 fL — AB (ref 80.0–100.0)
Platelets: 250 10*3/uL (ref 150–400)
RBC: 4.35 MIL/uL (ref 3.87–5.11)
RDW: 14.3 % (ref 11.5–15.5)
WBC: 6.4 10*3/uL (ref 4.0–10.5)
nRBC: 0 % (ref 0.0–0.2)

## 2018-08-30 LAB — COMPREHENSIVE METABOLIC PANEL
ALT: 113 U/L — AB (ref 0–44)
ANION GAP: 6 (ref 5–15)
AST: 63 U/L — ABNORMAL HIGH (ref 15–41)
Albumin: 2.5 g/dL — ABNORMAL LOW (ref 3.5–5.0)
Alkaline Phosphatase: 38 U/L (ref 38–126)
BUN: 9 mg/dL (ref 6–20)
CALCIUM: 8.2 mg/dL — AB (ref 8.9–10.3)
CO2: 23 mmol/L (ref 22–32)
CREATININE: 0.97 mg/dL (ref 0.44–1.00)
Chloride: 107 mmol/L (ref 98–111)
Glucose, Bld: 92 mg/dL (ref 70–99)
Potassium: 3.4 mmol/L — ABNORMAL LOW (ref 3.5–5.1)
SODIUM: 136 mmol/L (ref 135–145)
Total Bilirubin: 1.4 mg/dL — ABNORMAL HIGH (ref 0.3–1.2)
Total Protein: 5.1 g/dL — ABNORMAL LOW (ref 6.5–8.1)

## 2018-08-30 LAB — HEPATITIS PANEL, ACUTE
HEP A IGM: NEGATIVE
HEP B C IGM: NEGATIVE
HEP B S AG: NEGATIVE

## 2018-08-30 LAB — HEPATITIS B SURFACE ANTIBODY, QUANTITATIVE: HEPATITIS B-POST: 10.4 m[IU]/mL

## 2018-08-30 LAB — HIV ANTIBODY (ROUTINE TESTING W REFLEX): HIV Screen 4th Generation wRfx: NONREACTIVE

## 2018-08-30 LAB — HEPATITIS B SURFACE ANTIGEN: HEP B S AG: NEGATIVE

## 2018-08-30 MED ORDER — POTASSIUM CHLORIDE CRYS ER 20 MEQ PO TBCR
20.0000 meq | EXTENDED_RELEASE_TABLET | Freq: Once | ORAL | Status: AC
  Administered 2018-08-30: 20 meq via ORAL
  Filled 2018-08-30: qty 1

## 2018-08-30 NOTE — Progress Notes (Signed)
Called lab and spoke to [employer].  She stated am labs including Aldosterone were taken.  Trying to clarify with lab if draw was received because pt MUST NOT eat breakfast before blood draw.

## 2018-08-30 NOTE — Progress Notes (Signed)
   Subjective:  Patient states she is feeling much better this morning and has not had any more nausea, vomiting or abdominal pain. Denies fever, dizziness, chills, or headache. She is requesting a normal diet.   Objective:  Vital signs in last 24 hours: Vitals:   08/29/18 1413 08/29/18 1538 08/29/18 2100 08/30/18 0554  BP: (!) 176/128 (!) 188/155 (!) 171/140 (!) 153/119  Pulse: 89 96 (!) 103 84  Resp: (!) 28 17 16 16   Temp:  (!) 97.4 F (36.3 C) 98.9 F (37.2 C) 97.7 F (36.5 C)  TempSrc:  Oral Oral Oral  SpO2: 100% 100% 100% 100%  Weight:  106.1 kg    Height:  5\' 3"  (1.6 m)     Physical Exam Constitution: NAD, lying supine in bed HEENT: no scleral icterus, PERRLA  Cardio: RRR, no m/r/g Respiratory: CTAB Abdominal: +BS, NTTP, soft, non-distended Neuro: a&o, cooperative, normal affect Skin: c/d/i    Assessment/Plan:  Principal Problem:   Hypertensive urgency Active Problems:   Abdominal pain   32yo female with PMH of hypertension diagnosed at age 32 presenting with abdominal pain, nausea, and vomiting that began suddenly one week ago found to have transaminitis with cholecystic and bladder inflammation on CT.  Gastroenteritis Transaminitis Symptoms of n/v/abdominal pain resolved s/p fluid resuscitation and nausea medications. Acetaminophen, TSH level normal. Mildly hypokalemic today after IVF. Very likely that this was viral illness with transaminatis secondary to dehydration and vomiting.   - advance to regular diet - phenergan prn nausea  - hepatitis panel pending  - am CMP  Hypertensive Urgency Her blood pressure continues to be elevated although improved since admission to 155/122. She states she checks her BP at home infrequently but it runs around 170s/100s. We will stabilize her blood pressure prior to discharge as it seems to have been dangerously elevated for some time with rebound HTN after she was unable to take her medications due to gastroenteritis. I  have increased her home norvasc and lisinopril this morning, but she will likely need a third bp medication. We will trend bp today and possibly add clonidine.   - cont lisinopril 40mg  qd, norvasc 10mg  qd - Aldosterone, renin ratio pending  - metanephrines ordered - protein, creatinine urine 24 hours - renal artery duplex pending   Iron Deficiency Anemia Ferritin 10, TIBC 447, Iron 18   - ferrous sulfate 325 qd - am CBC    VTE: lovenox IVF: none Diet: heart healthy Code: full   Dispo: Anticipated discharge in approximately one day.   Molli Hazard A, DO 08/30/2018, 7:34 AM Pager: [phone]

## 2018-08-30 NOTE — Progress Notes (Signed)
Renal artery duplex has been completed. Normal size right kidney. Normal right Resisitive Index. 1-59% stenosis of the right renal artery.  Normal size of left kidney. Normal left Resistive Index. 1-59% stenosis of the left renal artery.   08/30/18 10:35 AM [name] [name] RVT

## 2018-08-30 NOTE — Progress Notes (Addendum)
Paged Dr. Laural Golden per IM.  Re: Phleb read to me am Aldosterone labs have very specific instructions to obtain between 5-7am with pt haven been up ambulating and active prior to blood draw.  Pt asking to sleep and not be up until after breakfast.   MD aware and asked phleb to obtain as early as possible after pt has woken and ambulated.

## 2018-08-30 NOTE — Progress Notes (Signed)
   08/30/18 1000  Clinical Encounter Type  Visited With Patient not available  Visit Type Initial  Referral From Other (Comment) (chaplain rounding)   Attempted initial visit, pt not in room.  Chaplain remains available.  Myra Gianotti resident, [phone]

## 2018-08-31 LAB — CBC
HCT: 33.4 % — ABNORMAL LOW (ref 36.0–46.0)
Hemoglobin: 10.2 g/dL — ABNORMAL LOW (ref 12.0–15.0)
MCH: 23.2 pg — AB (ref 26.0–34.0)
MCHC: 30.5 g/dL (ref 30.0–36.0)
MCV: 75.9 fL — AB (ref 80.0–100.0)
NRBC: 0 % (ref 0.0–0.2)
PLATELETS: 252 10*3/uL (ref 150–400)
RBC: 4.4 MIL/uL (ref 3.87–5.11)
RDW: 14.1 % (ref 11.5–15.5)
WBC: 9.4 10*3/uL (ref 4.0–10.5)

## 2018-08-31 LAB — COMPREHENSIVE METABOLIC PANEL
ALK PHOS: 41 U/L (ref 38–126)
ALT: 102 U/L — AB (ref 0–44)
ANION GAP: 7 (ref 5–15)
AST: 41 U/L (ref 15–41)
Albumin: 2.6 g/dL — ABNORMAL LOW (ref 3.5–5.0)
BILIRUBIN TOTAL: 0.9 mg/dL (ref 0.3–1.2)
BUN: 10 mg/dL (ref 6–20)
CALCIUM: 8.4 mg/dL — AB (ref 8.9–10.3)
CO2: 23 mmol/L (ref 22–32)
CREATININE: 0.99 mg/dL (ref 0.44–1.00)
Chloride: 107 mmol/L (ref 98–111)
GFR calc non Af Amer: >60 mL/min (ref >60)
GLUCOSE: 114 mg/dL — AB (ref 70–99)
Potassium: 3.6 mmol/L (ref 3.5–5.1)
Sodium: 137 mmol/L (ref 135–145)
TOTAL PROTEIN: 5.3 g/dL — AB (ref 6.5–8.1)

## 2018-08-31 LAB — MAGNESIUM: MAGNESIUM: 2 mg/dL (ref 1.7–2.4)

## 2018-08-31 MED ORDER — CHLORTHALIDONE 25 MG PO TABS: 25.0000 mg | ORAL_TABLET | Freq: Every day | ORAL | 0 refills | Status: DC

## 2018-08-31 MED ORDER — LISINOPRIL 40 MG PO TABS: 40.0000 mg | ORAL_TABLET | Freq: Every day | ORAL | 0 refills | Status: DC

## 2018-08-31 MED ORDER — FERROUS SULFATE 325 (65 FE) MG PO TABS: 325.0000 mg | ORAL_TABLET | Freq: Every day | ORAL | 0 refills | Status: DC

## 2018-08-31 MED ORDER — POLYETHYLENE GLYCOL 3350 17 G PO PACK: 17.0000 g | PACK | Freq: Every day | ORAL | 0 refills | Status: DC | PRN

## 2018-08-31 MED ORDER — AMLODIPINE BESYLATE 10 MG PO TABS: 10.0000 mg | ORAL_TABLET | Freq: Every day | ORAL | 0 refills | Status: DC

## 2018-08-31 NOTE — Plan of Care (Signed)

## 2018-08-31 NOTE — Progress Notes (Signed)
[name] [name] to be D/C'd  per MD order. Discussed with the patient and all questions fully answered.  VSS, Skin clean, dry and intact without evidence of skin break down, no evidence of skin tears noted.  IV catheter discontinued intact. Site without signs and symptoms of complications. Dressing and pressure applied.  An After Visit Summary was printed and given to the patient.   D/c education completed with patient/family including follow up instructions, medication list, d/c activities limitations if indicated, with other d/c instructions as indicated by MD - patient able to verbalize understanding, all questions fully answered.   Patient instructed to return to ED, call 911, or call MD for any changes in condition.   Patient to be escorted via [place], and D/C home via private auto.

## 2018-08-31 NOTE — Progress Notes (Addendum)
Subjective: Patient reports improved PO and tolerance (no vomiting, and minimal nausea that resolved).  Patient also reports headache that self-resolved with rest.    Objective: Vital signs in last 24 hours: Vitals:   08/30/18 1644 08/30/18 2116 08/31/18 0508 08/31/18 0800  BP: (!) 169/119 (!) 145/113 (!) 153/116 (!) 181/137  Pulse: 86 (!) 102 95   Resp:      Temp:  98.8 F (37.1 C) (!) 97.5 F (36.4 C) 98.2 F (36.8 C)  TempSrc:  Oral Oral Oral  SpO2:  100% 98% 100%  Weight:      Height:      Ht 5\' 3"  (1.6 m)   Wt 106.1 kg   BMI 41.43 kg/m   Intake/Output Summary (Last 24 hours) at 08/31/2018 1042 Last data filed at 08/30/2018 1700 Gross per 24 hour  Intake 240 ml  Output 3 ml  Net 237 ml    General Appearance:    Alert, cooperative, no distress, appears stated age  Head:    Normocephalic, without obvious abnormality, atraumatic  Eyes:    EOM's intact,      Nose:  no drainage    or sinus tenderness  Throat:   Lips normal  Neck:  no enlargement/tenderness/nodules; no carotid   bruit or JVD  Back:     Symmetric, no curvature,  Lungs:     Clear to auscultation bilaterally, respirations unlabored  Chest Wall:    No tenderness or deformity   Heart:    Regular rate and rhythm, S1 and S2 normal, no murmur, rub   or gallop     Abdomen:     Soft, , bowel sounds active all four quadrants,    no masses, no organomegaly        Extremities:   Extremities normal, atraumatic, no cyanosis or edema  Pulses:   2+ and symmetric all extremities  Skin:   Skin color, texture, turgor normal, no rashes or lesions  Lymph nodes:   Cervical, supraclavicular, and axillary nodes normal  Neurologic:  No focal deficits noted   Lab Results: @LABTEST2 @ Micro Results: No results found for this or any previous visit (from the past 240 hour(s)). Studies/Results: Vas US Renal Artery Duplex  Result Date: 08/30/2018 ABDOMINAL VISCERAL Indications: Hypertension Limitations: Obesity and  air/bowel gas. Performing Technologist: Oliver Hum RVT  Examination Guidelines: A complete evaluation includes B-mode imaging, spectral Doppler, color Doppler, and power Doppler as needed of all accessible portions of each vessel. Bilateral testing is considered an integral part of a complete examination. Limited examinations for reoccurring indications may be performed as noted.  Duplex Findings: +--------------------+--------+--------+------+--------+ Mesenteric          PSV cm/sEDV cm/sPlaqueComments +--------------------+--------+--------+------+--------+ Celiac Artery Origin   51      13                  +--------------------+--------+--------+------+--------+ SMA Proximal           74      19                  +--------------------+--------+--------+------+--------+  +------------------+--------+--------+-------+ Right Renal ArteryPSV cm/sEDV cm/sComment +------------------+--------+--------+-------+ Origin               63      26           +------------------+--------+--------+-------+ Proximal             46      17           +------------------+--------+--------+-------+  Mid                  49      23           +------------------+--------+--------+-------+ Distal               53      23           +------------------+--------+--------+-------+ +-----------------+--------+--------+-------+ Left Renal ArteryPSV cm/sEDV cm/sComment +-----------------+--------+--------+-------+ Origin              41      9            +-----------------+--------+--------+-------+ Proximal            31      8            +-----------------+--------+--------+-------+ Mid                 26      10           +-----------------+--------+--------+-------+ Distal              26      11           +-----------------+--------+--------+-------+ +------------+--------+--------+----+-----------+--------+--------+----+ Right KidneyPSV cm/sEDV cm/sRI  Left  KidneyPSV cm/sEDV cm/sRI   +------------+--------+--------+----+-----------+--------+--------+----+ Upper Pole  26      12      0.54Upper Pole 23      10      0.58 +------------+--------+--------+----+-----------+--------+--------+----+ Mid         17      9       0.46Mid        26      12      0.54 +------------+--------+--------+----+-----------+--------+--------+----+ Lower Pole  13      4       0.67Lower Pole 19      8       0.60 +------------+--------+--------+----+-----------+--------+--------+----+ Hilar       55      19      0.65Hilar      24      11      0.54 +------------+--------+--------+----+-----------+--------+--------+----+ +------------------+-----+------------------+----+ Right Kidney           Left Kidney            +------------------+-----+------------------+----+ RAR                    RAR                    +------------------+-----+------------------+----+ RAR (manual)      0.88 RAR (manual)      0.51 +------------------+-----+------------------+----+ Cortex                 Cortex                 +------------------+-----+------------------+----+ Cortex thickness       Corex thickness        +------------------+-----+------------------+----+ Kidney length (cm)10.00Kidney length (cm)9.50 +------------------+-----+------------------+----+  Summary: Renal:  Right: Normal size right kidney. Normal right Resisitive Index.        1-59% stenosis of the right renal artery. Left:  Normal size of left kidney. Normal left Resistive Index.        1-59% stenosis of the left renal artery.  *See table(s) above for measurements and observations.  Diagnosing physician: Servando Snare MD  Electronically signed by Servando Snare MD on 08/30/2018 at 4:21:23 PM.    Final    Medications: I have  reviewed the patient's current medications. Scheduled Meds: . amLODipine  10 mg Oral Daily  . enoxaparin (LOVENOX) injection  40 mg Subcutaneous Q24H    . ferrous sulfate  325 mg Oral Q breakfast  . lisinopril  40 mg Oral Daily   Continuous Infusions: PRN Meds:.labetalol, promethazine  Assessment/Plan: Principal Problem:   Hypertensive urgency Active Problems:   Abdominal pain  [name] is a 32yo G1P1 female w. pmh s/o htn that presented with 1 week history of luq/epigastric pain with nausea and vomiting  in the setting of transaminitis, likely 2/2 gastroenteritis;  and found to be in hypertensive urgency, but with improved clinical picture today.   1. Hypertensive Urgency: Blood pressures continue to be elevated,  150s/110s at 5am readings. Exacerbated by non-adherence due to n/v.  also resistant to anti-hypertensive medication. Given the age of onset of hypertension diagnosis, secondary causes of hypertension (Conn's syndrome, Cushings) are under consideration   Less likely etiologies include pheochromocytoma, and chronic nsaid use (less likely because hypertension predates use). Obstructive sleep apnea and renovascular disease (stenotic renal arteries) could also contribute but Renal Artery Doppler unremarkable for stenosis (Read as  1-59% stenosed) - f/u aldosterone/renin ratio - Amlodipine 10mg    - Lisinopril 40mg  - Clorthalidone 25mg  qd - Hydral 5mg  for resuce SBP >180 - f/u sleep study in outpatient  2. Abdominal Pain with Nausea and Vomiting in the setting of transaminitis: Resolving LUQ/Epigastric; most likely due to gastroenteritis.  Liver injury secondary to acetaminophen toxicity, or hepatitis also under consideration.  CT imaging conveyed gallbladder inflammation. Acetaminophen levels wnl, Hep B Surface Antigen negative, Hep B Surface antibody immune status, HCV neg;   [name] was given maintenance fluids on 17 Nov  -phenergan 12.5mg  q6hr prn  3. Anemia 2/2 Iron Deficiency: Ferritin 10 ng/mL, Iron 18ug/dL; Saturation Ratios 4%  -325mg  Iron qd, CTM; outpt f/u in 3-71M  This is a Careers information officer Note.  The care of  the patient was discussed with Dr.Granfortuna and the assessment and plan formulated with their assistance.  Please see their attached note for official documentation of the daily encounter.   LOS: 2 days   Leodis Liverpool I, Medical Student MS3 08/31/2018, 10:42 AM

## 2018-09-01 ENCOUNTER — Other Ambulatory Visit: Payer: Self-pay | Admitting: *Deleted

## 2018-09-01 NOTE — Patient Outreach (Signed)
[place] [facility]) Care Management  09/01/2018  [name] [name] August 20, 1986 [id number]   Subjective: Telephone call to patient's home  / mobile number, no answer, left HIPAA compliant voicemail message, and requested call back.    Objective: Per KPN (Knowledge Performance Now, point of care tool) and chart review, patient hospitalized 08/29/18 - 08/31/18 for hypertensive urgency and gastroenteritis.   Patient also has a history of iron deficiency anemia.      Assessment: Received UMR Transition of care referral on 08/31/18.   Transition of care follow up pending patient contact.       Plan: RNCM will send unsuccessful outreach  letter, [facility] pamphlet, will call patient for 2nd telephone outreach attempt, transition of care follow up, and proceed with case closure, within 10 business days if no return call.         Emmani Lesueur H. Annia Friendly, BSN, [place] Management [facility] Telephonic CM Phone: [phone] Fax: [phone]

## 2018-09-02 ENCOUNTER — Other Ambulatory Visit: Payer: Self-pay | Admitting: *Deleted

## 2018-09-02 LAB — METANEPHRINES, PLASMA
Metanephrine, Free: 44 pg/mL (ref 0–62)
Normetanephrine, Free: 126 pg/mL (ref 0–145)

## 2018-09-02 NOTE — Patient Outreach (Signed)
[place] [facility]) Care Management  09/02/2018  [name] [name] 10-20-1985 [id number]   Subjective: Telephone call to patient's home  / mobile number, no answer, left HIPAA compliant voicemail message, and requested call back.    Objective: Per KPN (Knowledge Performance Now, point of care tool) and chart review, patient hospitalized 08/29/18 - 08/31/18 for hypertensive urgency and gastroenteritis.   Patient also has a history of iron deficiency anemia.      Assessment: Received UMR Transition of care referral on 08/31/18.   Transition of care follow up pending patient contact.       Plan: RNCM has sent unsuccessful outreach  letter, [facility] pamphlet, will call patient for 3rd telephone outreach attempt, transition of care follow up, and proceed with case closure, within 10 business days if no return call.        Cinzia Devos H. Annia Friendly, BSN, [place] Management [facility] Telephonic CM Phone: [phone] Fax: [phone]

## 2018-09-03 LAB — ALDOSTERONE + RENIN ACTIVITY W/ RATIO
ALDO / PRA RATIO: 2 (ref 0.0–30.0)
Aldosterone: 3.9 ng/dL (ref 0.0–30.0)
PRA LC/MS/MS: 1.978 ng/mL/hr (ref 0.167–5.380)

## 2018-09-06 ENCOUNTER — Other Ambulatory Visit: Payer: Self-pay | Admitting: *Deleted

## 2018-09-06 NOTE — Patient Outreach (Signed)
[place] [facility]) Care Management  09/06/2018  [name] [name] [birth year] [id number]   Subjective:Telephone call to patient's home / mobile number, no answer, left HIPAA compliant voicemail message, and requested call back.    Objective:Per KPN (Knowledge Performance Now, point of care tool) and chart review,patient hospitalized 08/29/18 - 08/31/18 for hypertensive urgency and gastroenteritis. Patient also has a history of iron deficiency anemia.     Assessment: Received UMR Transition of care referral on 08/31/18.Transition of care follow up pending patient contact.      Plan:RNCM has sent unsuccessful outreach letter, [facility] pamphlet, and will proceed with case closure, within 10 business days if no return call.        [name] [name] H. Annia Friendly, BSN, [place] Management [facility] Telephonic CM Phone: [phone] Fax: [phone]

## 2018-09-07 ENCOUNTER — Other Ambulatory Visit: Payer: Self-pay | Admitting: *Deleted

## 2018-09-07 NOTE — Patient Outreach (Signed)
[place] [facility]) Care Management  09/07/2018  [name] [name] 1986/02/06 [id number]   Subjective: Received voicemail message from [name] [name], states she is returning call, and requested call back. Telephone call to patient's home  / mobile number, no answer, left HIPAA compliant voicemail message, and requested call back.     Objective:Per KPN (Knowledge Performance Now, point of care tool) and chart review,patient hospitalized 08/29/18 - 08/31/18 for hypertensive urgency and gastroenteritis. Patient also has a history of iron deficiency anemia.     Assessment: Received UMR Transition of care referral on 08/31/18.Transition of care follow up pending patient contact.      Plan:RNCMhassentunsuccessful outreach letter, [facility] pamphlet, and will proceed with case closure, within 10 business days if no return call.        Delancey Moraes H. Annia Friendly, BSN, [place] Management [facility] Telephonic CM Phone: [phone] Fax: [phone]

## 2018-09-14 ENCOUNTER — Other Ambulatory Visit: Payer: Self-pay | Admitting: *Deleted

## 2018-09-14 NOTE — Patient Outreach (Signed)
[place] [facility]) Care Management  09/14/2018  [name] [name] [birth year] [id number]   No response from patient outreach attempts will proceed with case closure.     Objective:Per KPN (Knowledge Performance Now, point of care tool) and chart review,patient hospitalized 08/29/18 - 08/31/18 for hypertensive urgency and gastroenteritis. Patient also has a history of iron deficiency anemia.      Assessment: Received UMR Transition of care referral on 08/31/18.Transition of care follow up not completed due to unable to contact patient and will proceed with case closure.        Plan: Case closure due to unable to reach.       Tadd Holtmeyer H. Annia Friendly, BSN, [place] Management [facility] Telephonic CM Phone: [phone] Fax: [phone]

## 2018-10-08 DIAGNOSIS — I509 Heart failure, unspecified: Secondary | ICD-10-CM | POA: Diagnosis not present

## 2018-10-08 DIAGNOSIS — Z6841 Body Mass Index (BMI) 40.0 and over, adult: Secondary | ICD-10-CM | POA: Diagnosis not present

## 2018-10-08 DIAGNOSIS — A419 Sepsis, unspecified organism: Secondary | ICD-10-CM | POA: Diagnosis not present

## 2018-10-08 DIAGNOSIS — R791 Abnormal coagulation profile: Secondary | ICD-10-CM | POA: Diagnosis not present

## 2018-10-08 DIAGNOSIS — R7982 Elevated C-reactive protein (CRP): Secondary | ICD-10-CM | POA: Diagnosis not present

## 2018-10-08 DIAGNOSIS — J189 Pneumonia, unspecified organism: Secondary | ICD-10-CM | POA: Diagnosis not present

## 2018-10-08 DIAGNOSIS — I10 Essential (primary) hypertension: Secondary | ICD-10-CM | POA: Diagnosis not present

## 2018-10-08 DIAGNOSIS — R042 Hemoptysis: Secondary | ICD-10-CM | POA: Diagnosis not present

## 2018-10-08 DIAGNOSIS — R0602 Shortness of breath: Secondary | ICD-10-CM | POA: Diagnosis not present

## 2018-10-08 DIAGNOSIS — R918 Other nonspecific abnormal finding of lung field: Secondary | ICD-10-CM | POA: Diagnosis not present

## 2018-10-08 DIAGNOSIS — E669 Obesity, unspecified: Secondary | ICD-10-CM | POA: Diagnosis not present

## 2018-10-08 DIAGNOSIS — R0609 Other forms of dyspnea: Secondary | ICD-10-CM | POA: Diagnosis not present

## 2018-10-08 DIAGNOSIS — R079 Chest pain, unspecified: Secondary | ICD-10-CM | POA: Diagnosis not present

## 2018-10-08 DIAGNOSIS — I5021 Acute systolic (congestive) heart failure: Secondary | ICD-10-CM | POA: Diagnosis not present

## 2018-10-08 DIAGNOSIS — I340 Nonrheumatic mitral (valve) insufficiency: Secondary | ICD-10-CM | POA: Diagnosis not present

## 2018-10-08 DIAGNOSIS — I313 Pericardial effusion (noninflammatory): Secondary | ICD-10-CM | POA: Diagnosis not present

## 2018-10-11 DIAGNOSIS — I82621 Acute embolism and thrombosis of deep veins of right upper extremity: Secondary | ICD-10-CM | POA: Diagnosis not present

## 2018-10-11 DIAGNOSIS — R079 Chest pain, unspecified: Secondary | ICD-10-CM | POA: Diagnosis not present

## 2018-10-11 DIAGNOSIS — R0609 Other forms of dyspnea: Secondary | ICD-10-CM | POA: Diagnosis not present

## 2018-10-11 DIAGNOSIS — R042 Hemoptysis: Secondary | ICD-10-CM | POA: Diagnosis not present

## 2018-10-11 DIAGNOSIS — J189 Pneumonia, unspecified organism: Secondary | ICD-10-CM | POA: Diagnosis not present

## 2018-10-11 DIAGNOSIS — I509 Heart failure, unspecified: Secondary | ICD-10-CM | POA: Diagnosis not present

## 2018-10-11 DIAGNOSIS — I10 Essential (primary) hypertension: Secondary | ICD-10-CM | POA: Diagnosis not present

## 2018-10-11 DIAGNOSIS — Z6841 Body Mass Index (BMI) 40.0 and over, adult: Secondary | ICD-10-CM | POA: Diagnosis not present

## 2018-10-11 DIAGNOSIS — R7982 Elevated C-reactive protein (CRP): Secondary | ICD-10-CM | POA: Diagnosis not present

## 2018-10-11 DIAGNOSIS — A419 Sepsis, unspecified organism: Secondary | ICD-10-CM | POA: Diagnosis not present

## 2018-10-11 DIAGNOSIS — I313 Pericardial effusion (noninflammatory): Secondary | ICD-10-CM | POA: Diagnosis not present

## 2018-10-11 DIAGNOSIS — E669 Obesity, unspecified: Secondary | ICD-10-CM | POA: Diagnosis not present

## 2018-10-13 DIAGNOSIS — R042 Hemoptysis: Secondary | ICD-10-CM | POA: Diagnosis not present

## 2018-10-13 DIAGNOSIS — A419 Sepsis, unspecified organism: Secondary | ICD-10-CM | POA: Diagnosis not present

## 2018-10-13 DIAGNOSIS — I10 Essential (primary) hypertension: Secondary | ICD-10-CM | POA: Diagnosis not present

## 2018-10-13 DIAGNOSIS — J189 Pneumonia, unspecified organism: Secondary | ICD-10-CM | POA: Diagnosis not present

## 2018-10-13 DIAGNOSIS — R7982 Elevated C-reactive protein (CRP): Secondary | ICD-10-CM | POA: Diagnosis not present

## 2018-10-13 DIAGNOSIS — E669 Obesity, unspecified: Secondary | ICD-10-CM | POA: Diagnosis not present

## 2018-10-13 DIAGNOSIS — Z6841 Body Mass Index (BMI) 40.0 and over, adult: Secondary | ICD-10-CM | POA: Diagnosis not present

## 2018-10-13 DIAGNOSIS — I313 Pericardial effusion (noninflammatory): Secondary | ICD-10-CM | POA: Diagnosis not present

## 2018-10-18 ENCOUNTER — Other Ambulatory Visit: Payer: Self-pay | Admitting: *Deleted

## 2018-10-18 NOTE — Patient Outreach (Addendum)
[place] [facility]) Care Management  10/18/2018  [name] [name] September 30, 1986 [id number]   Transition of care call Date referral received: 10/15/18  Subjective: Initial successful telephone call to patient's preferred number (mobile/home) in order to complete transition of care assessment;  2 HIPAA identifiers verified. Explained purpose of call and completed transition of care assessment. Also assessed and/or reviewed: caregiver assistance, pain control with prescribed pain medications, medication reconciliation, medication taking behavior, ambulation ability, diet tolerance, and problems requiring MD notification. [name] says she was told by the health care providers at the hospital that the fluid around her heart was probably related to a viral infection she had in November that required hospitalization.  She reports a long history of HTN says she has all of her prescribed medications but does not use a [facility] OP pharmacy. She says she does not have a home blood pressure monitor but does occasionally monitor her blood pressure at a drug store. . She also said she is not active with the [place] Chronic condition management program, [facility].  She also says she was told her ejection fraction is lower than normal (30%) and that it was probably due to uncontrolled blood pressure.   Objective:  [name] [name] was hospitalized (while vacationing in [place]) at [facility] from 12/27-19- 10/13/18 for pneumonia and pericardial effusion. Comorbidities include: HTN. She was discharged to home on 10/13/18 without the need for DME or home health services.  Assessment:  See transition of care flowsheet for assessment details. .   Plan:  Encouraged Indie to transfer all prescription medications from [facility] to one of the [facility] OP pharmacies and provided her with the phone number and address of the nearest [facility] OP pharmacy.   Discussed [facility]  chronic disease program offered by [place] to assist her with HTN management and provided contact information in the successful outreach letter .  No care management needs identified so will close case to [place] Management care management services and route successful outreach letter to [place] Management clinical pool to be mailed to patient's home address.   Barrington Ellison RN,CCM,CDE [place] Management Coordinator Office Phone [phone] Office Fax [phone]

## 2018-10-22 DIAGNOSIS — R042 Hemoptysis: Secondary | ICD-10-CM | POA: Diagnosis not present

## 2018-10-22 DIAGNOSIS — I309 Acute pericarditis, unspecified: Secondary | ICD-10-CM | POA: Diagnosis not present

## 2018-10-22 DIAGNOSIS — J189 Pneumonia, unspecified organism: Secondary | ICD-10-CM | POA: Diagnosis not present

## 2018-10-22 DIAGNOSIS — Z23 Encounter for immunization: Secondary | ICD-10-CM | POA: Diagnosis not present

## 2018-10-22 DIAGNOSIS — Z6841 Body Mass Index (BMI) 40.0 and over, adult: Secondary | ICD-10-CM | POA: Diagnosis not present

## 2018-11-29 ENCOUNTER — Encounter: Payer: Self-pay | Admitting: Physician Assistant

## 2018-12-16 ENCOUNTER — Encounter: Payer: Self-pay | Admitting: Cardiology

## 2018-12-16 ENCOUNTER — Ambulatory Visit (INDEPENDENT_AMBULATORY_CARE_PROVIDER_SITE_OTHER): Payer: 59 | Admitting: Cardiology

## 2018-12-16 ENCOUNTER — Telehealth: Payer: Self-pay

## 2018-12-16 VITALS — BP 128/100 | HR 83 | Ht 63.0 in | Wt 236.8 lb

## 2018-12-16 DIAGNOSIS — Z6841 Body Mass Index (BMI) 40.0 and over, adult: Secondary | ICD-10-CM | POA: Diagnosis not present

## 2018-12-16 DIAGNOSIS — I10 Essential (primary) hypertension: Secondary | ICD-10-CM

## 2018-12-16 DIAGNOSIS — I509 Heart failure, unspecified: Secondary | ICD-10-CM

## 2018-12-16 LAB — CBC
HEMATOCRIT: 34.3 % (ref 34.0–46.6)
HEMOGLOBIN: 10.4 g/dL — AB (ref 11.1–15.9)
MCH: 23.2 pg — ABNORMAL LOW (ref 26.6–33.0)
MCHC: 30.3 g/dL — ABNORMAL LOW (ref 31.5–35.7)
MCV: 76 fL — ABNORMAL LOW (ref 79–97)
Platelets: 239 10*3/uL (ref 150–450)
RBC: 4.49 x10E6/uL (ref 3.77–5.28)
RDW: 15.9 % — ABNORMAL HIGH (ref 11.7–15.4)
WBC: 7.1 10*3/uL (ref 3.4–10.8)

## 2018-12-16 LAB — BASIC METABOLIC PANEL
BUN/Creatinine Ratio: 14 (ref 9–23)
BUN: 16 mg/dL (ref 6–20)
CALCIUM: 8.4 mg/dL — AB (ref 8.7–10.2)
CHLORIDE: 104 mmol/L (ref 96–106)
CO2: 20 mmol/L (ref 20–29)
Creatinine, Ser: 1.12 mg/dL — ABNORMAL HIGH (ref 0.57–1.00)
GFR calc Af Amer: 76 mL/min/{1.73_m2} (ref >59)
GFR calc non Af Amer: 66 mL/min/{1.73_m2} (ref >59)
GLUCOSE: 79 mg/dL (ref 65–99)
POTASSIUM: 3.8 mmol/L (ref 3.5–5.2)
Sodium: 138 mmol/L (ref 134–144)

## 2018-12-16 LAB — PRO B NATRIURETIC PEPTIDE: NT-PRO BNP: 3613 pg/mL — AB (ref 0–130)

## 2018-12-16 MED ORDER — AMLODIPINE BESYLATE 10 MG PO TABS: 10.0000 mg | ORAL_TABLET | Freq: Every day | ORAL | 3 refills | Status: DC

## 2018-12-16 MED ORDER — LISINOPRIL 40 MG PO TABS: 40.0000 mg | ORAL_TABLET | Freq: Every day | ORAL | 3 refills | Status: DC

## 2018-12-16 MED ORDER — FUROSEMIDE 40 MG PO TABS: 40.0000 mg | ORAL_TABLET | Freq: Every day | ORAL | 3 refills | Status: DC

## 2018-12-16 MED ORDER — CARVEDILOL 25 MG PO TABS: 25.0000 mg | ORAL_TABLET | Freq: Two times a day (BID) | ORAL | 3 refills | Status: DC

## 2018-12-16 NOTE — Telephone Encounter (Signed)
Patient called in, was given to [facility] Triage- after speaking with patient she has had increased weight gain normal weight was 220, and has noticed an increase the past few days she is now 229. Patient states she is swelling in her abdomen, legs, and feet. She does not have any appetite, and believes that her BP medication is causing her issues, she has not been taking it the past few days. Patient did not know her BP or HR at this time, she is fatigue and was slightly SOB on the phone while talking.  Patient denies chest pains/tightness. Patient had appointment on 03/13, but would have liked to be seen sooner in case something else was going on. Scheduled with Daune Perch, NP at [facility] office as it was an opening. Patient verbalized understanding of appointment date and time.

## 2018-12-16 NOTE — Patient Instructions (Signed)
Medication Instructions:  1.) STOP : Chlorthalidone   2.) START: Lasix 40 mg once a day  3.) INCREASE: Carvedilol to 25 mg twice a day   If you need a refill on your cardiac medications before your next appointment, please call your pharmacy.   Lab work: TODAY: BMET, CBC & PRO BNP   If you have labs (blood work) drawn today and your tests are completely normal, you will receive your results only by: Marland Kitchen MyChart Message (if you have MyChart) OR . A paper copy in the mail If you have any lab test that is abnormal or we need to change your treatment, we will call you to review the results.  Testing/Procedures: Your physician has requested that you have an echocardiogram. Echocardiography is a painless test that uses sound waves to create images of your heart. It provides your doctor with information about the size and shape of your heart and how well your heart's chambers and valves are working. This procedure takes approximately one hour. There are no restrictions for this procedure.    Follow-Up: Keep follow up appointment with Richardson Dopp PA-C on 12/24/2018 @ 8:15 AM   You have been referred to Nutrition someone will contact you with an appointment   Any Other Special Instructions Will Be Listed Below (If Applicable).   Low-Sodium Eating Plan Sodium, which is an element that makes up salt, helps you maintain a healthy balance of fluids in your body. Too much sodium can increase your blood pressure and cause fluid and waste to be held in your body. Your health care provider or dietitian may recommend following this plan if you have high blood pressure (hypertension), kidney disease, liver disease, or heart failure. Eating less sodium can help lower your blood pressure, reduce swelling, and protect your heart, liver, and kidneys. What are tips for following this plan? General guidelines  Most people on this plan should limit their sodium intake to 1,500-2,000 mg (milligrams) of  sodium each day. Reading food labels   The Nutrition Facts label lists the amount of sodium in one serving of the food. If you eat more than one serving, you must multiply the listed amount of sodium by the number of servings.  Choose foods with less than 140 mg of sodium per serving.  Avoid foods with 300 mg of sodium or more per serving. Shopping  Look for lower-sodium products, often labeled as "low-sodium" or "no salt added."  Always check the sodium content even if foods are labeled as "unsalted" or "no salt added".  Buy fresh foods. ? Avoid canned foods and premade or frozen meals. ? Avoid canned, cured, or processed meats  Buy breads that have less than 80 mg of sodium per slice. Cooking  Eat more home-cooked food and less restaurant, buffet, and fast food.  Avoid adding salt when cooking. Use salt-free seasonings or herbs instead of table salt or sea salt. Check with your health care provider or pharmacist before using salt substitutes.  Cook with plant-based oils, such as canola, sunflower, or olive oil. Meal planning  When eating at a restaurant, ask that your food be prepared with less salt or no salt, if possible.  Avoid foods that contain MSG (monosodium glutamate). MSG is sometimes added to [place] food, bouillon, and some canned foods. What foods are recommended? The items listed may not be a complete list. Talk with your dietitian about what dietary choices are best for you. Grains Low-sodium cereals, including oats, puffed wheat and rice,  and shredded wheat. Low-sodium crackers. Unsalted rice. Unsalted pasta. Low-sodium bread. Whole-grain breads and whole-grain pasta. Vegetables Fresh or frozen vegetables. "No salt added" canned vegetables. "No salt added" tomato sauce and paste. Low-sodium or reduced-sodium tomato and vegetable juice. Fruits Fresh, frozen, or canned fruit. Fruit juice. Meats and other protein foods Fresh or frozen (no salt added) meat,  poultry, seafood, and fish. Low-sodium canned tuna and salmon. Unsalted nuts. Dried peas, beans, and lentils without added salt. Unsalted canned beans. Eggs. Unsalted nut butters. Dairy Milk. Soy milk. Cheese that is naturally low in sodium, such as ricotta cheese, fresh mozzarella, or Swiss cheese Low-sodium or reduced-sodium cheese. Cream cheese. Yogurt. Fats and oils Unsalted butter. Unsalted margarine with no trans fat. Vegetable oils such as canola or olive oils. Seasonings and other foods Fresh and dried herbs and spices. Salt-free seasonings. Low-sodium mustard and ketchup. Sodium-free salad dressing. Sodium-free light mayonnaise. Fresh or refrigerated horseradish. Lemon juice. Vinegar. Homemade, reduced-sodium, or low-sodium soups. Unsalted popcorn and pretzels. Low-salt or salt-free chips. What foods are not recommended? The items listed may not be a complete list. Talk with your dietitian about what dietary choices are best for you. Grains Instant hot cereals. Bread stuffing, pancake, and biscuit mixes. Croutons. Seasoned rice or pasta mixes. Noodle soup cups. Boxed or frozen macaroni and cheese. Regular salted crackers. Self-rising flour. Vegetables Sauerkraut, pickled vegetables, and relishes. Olives. [place] fries. Onion rings. Regular canned vegetables (not low-sodium or reduced-sodium). Regular canned tomato sauce and paste (not low-sodium or reduced-sodium). Regular tomato and vegetable juice (not low-sodium or reduced-sodium). Frozen vegetables in sauces. Meats and other protein foods Meat or fish that is salted, canned, smoked, spiced, or pickled. Bacon, ham, sausage, hotdogs, corned beef, chipped beef, packaged lunch meats, salt pork, jerky, pickled herring, anchovies, regular canned tuna, sardines, salted nuts. Dairy Processed cheese and cheese spreads. Cheese curds. Blue cheese. Feta cheese. String cheese. Regular cottage cheese. Buttermilk. Canned milk. Fats and oils Salted  butter. Regular margarine. Ghee. Bacon fat. Seasonings and other foods Onion salt, garlic salt, seasoned salt, table salt, and sea salt. Canned and packaged gravies. Worcestershire sauce. Tartar sauce. Barbecue sauce. Teriyaki sauce. Soy sauce, including reduced-sodium. Steak sauce. Fish sauce. Oyster sauce. Cocktail sauce. Horseradish that you find on the shelf. Regular ketchup and mustard. Meat flavorings and tenderizers. Bouillon cubes. Hot sauce and Tabasco sauce. Premade or packaged marinades. Premade or packaged taco seasonings. Relishes. Regular salad dressings. Salsa. Potato and tortilla chips. Corn chips and puffs. Salted popcorn and pretzels. Canned or dried soups. Pizza. Frozen entrees and pot pies. Summary  Eating less sodium can help lower your blood pressure, reduce swelling, and protect your heart, liver, and kidneys.  Most people on this plan should limit their sodium intake to 1,500-2,000 mg (milligrams) of sodium each day.  Canned, boxed, and frozen foods are high in sodium. Restaurant foods, fast foods, and pizza are also very high in sodium. You also get sodium by adding salt to food.  Try to cook at home, eat more fresh fruits and vegetables, and eat less fast food, canned, processed, or prepared foods. This information is not intended to replace advice given to you by your health care provider. Make sure you discuss any questions you have with your health care provider. Document Released: 03/21/2002 Document Revised: 09/22/2016 Document Reviewed: 09/22/2016 Elsevier Interactive Patient Education  2019 [employer].

## 2018-12-16 NOTE — Progress Notes (Signed)
Cardiology Office Note:    Date:  12/16/2018   ID:  [name] [name], DOB [birth year], MRN [id number]  PCP:  Fanny Bien, MD  Cardiologist:  Dorris Carnes, MD  Referring MD: Fanny Bien, MD   Chief Complaint  Patient presents with  . Leg Swelling    History of Present Illness:    [name] [name] is a 33 y.o. female with a past medical history significant for pregnancy induced HTN.  She was seen by Dr. Dorris Carnes in 12/16.  She was seen in the HTN clinic once as well. She delivered in 5/17.  She has several occurrences of being noncompliant with her BP meds.   She had normal LV function on echo done while pregnant in 09/2015.   She went to [facility] in [facility] for shortness of breath in December. Hospitalized for 7 days. She says they noted pneumonia and  "fluid around my heart". Carvedilol was added and she was to follow up with our office in 60 days. Since then her breathing improved with tx for PNA. Yesterday her feet and abdomen started swelling and she has felt mild weakness. She denies shortness of breath or chest discomfort. She denies orthopnea or PND last night.  She reports productive cough with "bloody sputum". She denies any fever or chills and no upper respiratory symptoms.   She has run out of lisinopril and has not taken any in about 2 weeks (although I suspect may be longer). She says she just ran out of amlodipine with last dose this morning. She states that she is taking all of her other medications.   Pt admits to eating a lot of salty foods including a lot of pickles lately and overall has a poor diet with fast food. She does not exercise. She says that she needs to lose weight and would like to see a nutritionist for guidance.   She denies possibility of being pregnant.   Past Medical History:  Diagnosis Date  . Condyloma acuminata   . HTN in pregnancy, chronic 09/24/2015   [place] labs: [x ]Cr, [x]  24hr protein (208) P:Cr 92 Plat: 207  AST/ALT: 10/10    . Hypertension   . Obesity in pregnancy    Antepartum, first trimester  . URI (upper respiratory infection)     Past Surgical History:  Procedure Laterality Date  . BIRTH CONTROL IMPLANT Left 03/2016   "Nexplanon in my upper arm"  . CESAREAN SECTION N/A 02/25/2016   Procedure: CESAREAN SECTION;  Surgeon: Truett Mainland, DO;  Location: [facility];  Service: Obstetrics;  Laterality: N/A;    Current Medications: Current Meds  Medication Sig  . amLODipine (NORVASC) 10 MG tablet Take 1 tablet (10 mg total) by mouth daily.  . colchicine 0.6 MG tablet Take 0.6 mg by mouth daily.  Marland Kitchen etonogestrel (NEXPLANON) 68 MG IMPL implant 1 each by Subdermal route once.  . ferrous sulfate 325 (65 FE) MG tablet Take 1 tablet (325 mg total) by mouth daily with breakfast.  . lisinopril (PRINIVIL,ZESTRIL) 40 MG tablet Take 1 tablet (40 mg total) by mouth daily.  Marland Kitchen spironolactone (ALDACTONE) 25 MG tablet Take 25 mg by mouth daily.  . [DISCONTINUED] amLODipine (NORVASC) 10 MG tablet Take 1 tablet (10 mg total) by mouth daily.  . [DISCONTINUED] carvedilol (COREG) 12.5 MG tablet Take 12.5 mg by mouth 2 (two) times daily with a meal.  . [DISCONTINUED] chlorthalidone (HYGROTON) 25 MG tablet Take 1 tablet (25 mg total) by  mouth daily.  . [DISCONTINUED] lisinopril (PRINIVIL,ZESTRIL) 40 MG tablet Take 1 tablet (40 mg total) by mouth daily.     Allergies:   Patient has no known allergies.   Social History   Socioeconomic History  . Marital status: Single    Spouse name: n/a  . Number of children: 0  . Years of education: college  . Highest education level: Not on file  Occupational History  . Occupation: Therapist, art Rep    Employer: [employer]  . Financial resource strain: Not on file  . Food insecurity:    Worry: Not on file    Inability: Not on file  . Transportation needs:    Medical: Not on file    Non-medical: Not on file  Tobacco Use  . Smoking  status: Never Smoker  . Smokeless tobacco: Never Used  Substance and Sexual Activity  . Alcohol use: Never    Frequency: Never  . Drug use: Never  . Sexual activity: Yes    Birth control/protection: None  Lifestyle  . Physical activity:    Days per week: Not on file    Minutes per session: Not on file  . Stress: Not on file  Relationships  . Social connections:    Talks on phone: Not on file    Gets together: Not on file    Attends religious service: Not on file    Active member of club or organization: Not on file    Attends meetings of clubs or organizations: Not on file    Relationship status: Not on file  Other Topics Concern  . Not on file  Social History Narrative   Lives alone.  No family lives nearby.     Family History: The patient's family history includes Heart disease in her mother; Hypertension in her mother. ROS:   Please see the history of present illness.     All other systems reviewed and are negative.  EKGs/Labs/Other Studies Reviewed:    The following studies were reviewed today:  Echocardiogram 10/11/2015 Study Conclusions - Left ventricle: The cavity size was mildly dilated. Wall   thickness was increased in a pattern of moderate LVH. Systolic   function was normal. The estimated ejection fraction was in the   range of 60% to 65%. Wall motion was normal; there were no   regional wall motion abnormalities. Doppler parameters are   consistent with elevated ventricular end-diastolic filling   pressure. - Left atrium: The atrium was mildly dilated. - Atrial septum: No defect or patent foramen ovale was identified.   EKG:  EKG is not ordered today.    Recent Labs: 08/29/2018: TSH 3.072 08/31/2018: ALT 102; BUN 10; Creatinine, Ser 0.99; Hemoglobin 10.2; Magnesium 2.0; Platelets 252; Potassium 3.6; Sodium 137   Recent Lipid Panel    Component Value Date/Time   CHOL 193 11/04/2016 0926   TRIG 103 11/04/2016 0926   HDL 36 (L) 11/04/2016 0926     CHOLHDL 5.4 (H) 11/04/2016 0926   LDLCALC 136 (H) 11/04/2016 0926    Physical Exam:    VS:  BP (!) 128/100   Pulse 83   Ht 5\' 3"  (1.6 m)   Wt 236 lb 12.8 oz (107.4 kg)   SpO2 99%   BMI 41.95 kg/m     Wt Readings from Last 3 Encounters:  12/16/18 236 lb 12.8 oz (107.4 kg)  08/29/18 233 lb 14.5 oz (106.1 kg)  10/28/16 229 lb 12.8 oz (104.2 kg)  Physical Exam  Constitutional: She is oriented to person, place, and time. No distress.  Obese female  HENT:  Head: Normocephalic and atraumatic.  Neck: Normal range of motion. Neck supple. No JVD present.  Cardiovascular: Normal rate, regular rhythm, normal heart sounds and intact distal pulses. Exam reveals no gallop and no friction rub.  No murmur heard. Pulmonary/Chest: Effort normal and breath sounds normal. No respiratory distress. She has no wheezes. She has no rales.  Abdominal: Soft. Bowel sounds are normal.  Musculoskeletal: Normal range of motion.        General: Edema present.     Comments: 1-2+ lower leg edema  Neurological: She is alert and oriented to person, place, and time.  Skin: Skin is warm and dry. She is not diaphoretic.  Psychiatric: She has a normal mood and affect. Her behavior is normal. Judgment and thought content normal.  Vitals reviewed.    ASSESSMENT:    1. Heart failure, unspecified HF chronicity, unspecified heart failure type (HCC)   2. Class 3 severe obesity due to excess calories with body mass index (BMI) of 40.0 to 44.9 in adult, unspecified whether serious comorbidity present ([place])   3. Essential hypertension    PLAN:    In order of problems listed above:  CHF -Pt has normal EF, but diastolic dysfunction in 63/8756.  -Pt with sudden lower extremity edema and abdominal distention starting yesterday. No orthopnea, PND or chest discomfort. Also has production cough with blood tinged sputum.  -Hospitalized in [facility] in December with PNA and possible HF with reduced EF- no echo to  review.  -Pt has uncontrolled HTN which may be playing a role, not taking all of her meds. -Carvedilol was added in December. She continues on ACEI (has been out for at least 2 weeks), chlorthalidone, spironolactone.  -With new symptoms, will switch chlorthalidone to Lasix 40 mg daily and increase carvedilol.  -Will check echo, BNP, BMet. Will also check CBC for leukocytosis with cough but is likely related to CHF exacerbation.  -Will have close follow up on 3/13 and reassess response to lasix. May need to cut back if volume looks better.  -Pt advised on low sodium diet  Obesity Body mass index is 41.95 kg/m. -Discussed diet and exercise (once symptoms improved).  -Pt thinks she would benefit from a nutritionist referral to help guide her on diet for wt loss and for CHF.   Hypertension -BP elevated. Pt has been out of lisinopril for at least 2 weeks, will restart.  -Will increase carvedilol (HR in the 80's). -Switching chlorthalidone to lasix for better fluid removal. Continue amlodipine, and spironolactone.   Medication Adjustments/Labs and Tests Ordered: Current medicines are reviewed at length with the patient today.  Concerns regarding medicines are outlined above. Labs and tests ordered and medication changes are outlined in the patient instructions below:  Patient Instructions  Medication Instructions:  1.) STOP : Chlorthalidone   2.) START: Lasix 40 mg once a day  3.) INCREASE: Carvedilol to 25 mg twice a day   If you need a refill on your cardiac medications before your next appointment, please call your pharmacy.   Lab work: TODAY: BMET, CBC & PRO BNP   If you have labs (blood work) drawn today and your tests are completely normal, you will receive your results only by: Marland Kitchen MyChart Message (if you have MyChart) OR . A paper copy in the mail If you have any lab test that is abnormal or we need to  change your treatment, we will call you to review the  results.  Testing/Procedures: Your physician has requested that you have an echocardiogram. Echocardiography is a painless test that uses sound waves to create images of your heart. It provides your doctor with information about the size and shape of your heart and how well your heart's chambers and valves are working. This procedure takes approximately one hour. There are no restrictions for this procedure.    Follow-Up: Keep follow up appointment with Richardson Dopp PA-C on 12/24/2018 @ 8:15 AM   You have been referred to Nutrition someone will contact you with an appointment   Any Other Special Instructions Will Be Listed Below (If Applicable).   Low-Sodium Eating Plan Sodium, which is an element that makes up salt, helps you maintain a healthy balance of fluids in your body. Too much sodium can increase your blood pressure and cause fluid and waste to be held in your body. Your health care provider or dietitian may recommend following this plan if you have high blood pressure (hypertension), kidney disease, liver disease, or heart failure. Eating less sodium can help lower your blood pressure, reduce swelling, and protect your heart, liver, and kidneys. What are tips for following this plan? General guidelines  Most people on this plan should limit their sodium intake to 1,500-2,000 mg (milligrams) of sodium each day. Reading food labels   The Nutrition Facts label lists the amount of sodium in one serving of the food. If you eat more than one serving, you must multiply the listed amount of sodium by the number of servings.  Choose foods with less than 140 mg of sodium per serving.  Avoid foods with 300 mg of sodium or more per serving. Shopping  Look for lower-sodium products, often labeled as "low-sodium" or "no salt added."  Always check the sodium content even if foods are labeled as "unsalted" or "no salt added".  Buy fresh foods. ? Avoid canned foods and premade or frozen  meals. ? Avoid canned, cured, or processed meats  Buy breads that have less than 80 mg of sodium per slice. Cooking  Eat more home-cooked food and less restaurant, buffet, and fast food.  Avoid adding salt when cooking. Use salt-free seasonings or herbs instead of table salt or sea salt. Check with your health care provider or pharmacist before using salt substitutes.  Cook with plant-based oils, such as canola, sunflower, or olive oil. Meal planning  When eating at a restaurant, ask that your food be prepared with less salt or no salt, if possible.  Avoid foods that contain MSG (monosodium glutamate). MSG is sometimes added to [place] food, bouillon, and some canned foods. What foods are recommended? The items listed may not be a complete list. Talk with your dietitian about what dietary choices are best for you. Grains Low-sodium cereals, including oats, puffed wheat and rice, and shredded wheat. Low-sodium crackers. Unsalted rice. Unsalted pasta. Low-sodium bread. Whole-grain breads and whole-grain pasta. Vegetables Fresh or frozen vegetables. "No salt added" canned vegetables. "No salt added" tomato sauce and paste. Low-sodium or reduced-sodium tomato and vegetable juice. Fruits Fresh, frozen, or canned fruit. Fruit juice. Meats and other protein foods Fresh or frozen (no salt added) meat, poultry, seafood, and fish. Low-sodium canned tuna and salmon. Unsalted nuts. Dried peas, beans, and lentils without added salt. Unsalted canned beans. Eggs. Unsalted nut butters. Dairy Milk. Soy milk. Cheese that is naturally low in sodium, such as ricotta cheese, fresh mozzarella, or Swiss cheese  Low-sodium or reduced-sodium cheese. Cream cheese. Yogurt. Fats and oils Unsalted butter. Unsalted margarine with no trans fat. Vegetable oils such as canola or olive oils. Seasonings and other foods Fresh and dried herbs and spices. Salt-free seasonings. Low-sodium mustard and ketchup. Sodium-free  salad dressing. Sodium-free light mayonnaise. Fresh or refrigerated horseradish. Lemon juice. Vinegar. Homemade, reduced-sodium, or low-sodium soups. Unsalted popcorn and pretzels. Low-salt or salt-free chips. What foods are not recommended? The items listed may not be a complete list. Talk with your dietitian about what dietary choices are best for you. Grains Instant hot cereals. Bread stuffing, pancake, and biscuit mixes. Croutons. Seasoned rice or pasta mixes. Noodle soup cups. Boxed or frozen macaroni and cheese. Regular salted crackers. Self-rising flour. Vegetables Sauerkraut, pickled vegetables, and relishes. Olives. [place] fries. Onion rings. Regular canned vegetables (not low-sodium or reduced-sodium). Regular canned tomato sauce and paste (not low-sodium or reduced-sodium). Regular tomato and vegetable juice (not low-sodium or reduced-sodium). Frozen vegetables in sauces. Meats and other protein foods Meat or fish that is salted, canned, smoked, spiced, or pickled. Bacon, ham, sausage, hotdogs, corned beef, chipped beef, packaged lunch meats, salt pork, jerky, pickled herring, anchovies, regular canned tuna, sardines, salted nuts. Dairy Processed cheese and cheese spreads. Cheese curds. Blue cheese. Feta cheese. String cheese. Regular cottage cheese. Buttermilk. Canned milk. Fats and oils Salted butter. Regular margarine. Ghee. Bacon fat. Seasonings and other foods Onion salt, garlic salt, seasoned salt, table salt, and sea salt. Canned and packaged gravies. Worcestershire sauce. Tartar sauce. Barbecue sauce. Teriyaki sauce. Soy sauce, including reduced-sodium. Steak sauce. Fish sauce. Oyster sauce. Cocktail sauce. Horseradish that you find on the shelf. Regular ketchup and mustard. Meat flavorings and tenderizers. Bouillon cubes. Hot sauce and Tabasco sauce. Premade or packaged marinades. Premade or packaged taco seasonings. Relishes. Regular salad dressings. Salsa. Potato and tortilla  chips. Corn chips and puffs. Salted popcorn and pretzels. Canned or dried soups. Pizza. Frozen entrees and pot pies. Summary  Eating less sodium can help lower your blood pressure, reduce swelling, and protect your heart, liver, and kidneys.  Most people on this plan should limit their sodium intake to 1,500-2,000 mg (milligrams) of sodium each day.  Canned, boxed, and frozen foods are high in sodium. Restaurant foods, fast foods, and pizza are also very high in sodium. You also get sodium by adding salt to food.  Try to cook at home, eat more fresh fruits and vegetables, and eat less fast food, canned, processed, or prepared foods. This information is not intended to replace advice given to you by your health care provider. Make sure you discuss any questions you have with your health care provider. Document Released: 03/21/2002 Document Revised: 09/22/2016 Document Reviewed: 09/22/2016 Elsevier Interactive Patient Education  2019 [place], Daune Perch, NP  12/16/2018 4:12 PM    [facility]

## 2018-12-17 ENCOUNTER — Inpatient Hospital Stay (HOSPITAL_COMMUNITY)
Admission: EM | Admit: 2018-12-17 | Discharge: 2018-12-27 | DRG: 291 | Disposition: A | Discharge status: Home Health | Payer: 59 | Attending: Internal Medicine | Admitting: Internal Medicine

## 2018-12-17 ENCOUNTER — Encounter (HOSPITAL_COMMUNITY): Payer: Self-pay | Admitting: Emergency Medicine

## 2018-12-17 ENCOUNTER — Emergency Department (HOSPITAL_COMMUNITY): Payer: 59

## 2018-12-17 DIAGNOSIS — R091 Pleurisy: Secondary | ICD-10-CM | POA: Diagnosis not present

## 2018-12-17 DIAGNOSIS — K761 Chronic passive congestion of liver: Secondary | ICD-10-CM | POA: Diagnosis present

## 2018-12-17 DIAGNOSIS — Z6841 Body Mass Index (BMI) 40.0 and over, adult: Secondary | ICD-10-CM

## 2018-12-17 DIAGNOSIS — I480 Paroxysmal atrial fibrillation: Secondary | ICD-10-CM | POA: Diagnosis present

## 2018-12-17 DIAGNOSIS — I5042 Chronic combined systolic (congestive) and diastolic (congestive) heart failure: Secondary | ICD-10-CM | POA: Diagnosis present

## 2018-12-17 DIAGNOSIS — R945 Abnormal results of liver function studies: Secondary | ICD-10-CM | POA: Diagnosis present

## 2018-12-17 DIAGNOSIS — Z8249 Family history of ischemic heart disease and other diseases of the circulatory system: Secondary | ICD-10-CM | POA: Diagnosis not present

## 2018-12-17 DIAGNOSIS — N926 Irregular menstruation, unspecified: Secondary | ICD-10-CM | POA: Diagnosis present

## 2018-12-17 DIAGNOSIS — Q211 Atrial septal defect: Secondary | ICD-10-CM | POA: Diagnosis not present

## 2018-12-17 DIAGNOSIS — Y95 Nosocomial condition: Secondary | ICD-10-CM | POA: Diagnosis present

## 2018-12-17 DIAGNOSIS — D509 Iron deficiency anemia, unspecified: Secondary | ICD-10-CM | POA: Diagnosis present

## 2018-12-17 DIAGNOSIS — R0602 Shortness of breath: Secondary | ICD-10-CM | POA: Diagnosis present

## 2018-12-17 DIAGNOSIS — Z79899 Other long term (current) drug therapy: Secondary | ICD-10-CM

## 2018-12-17 DIAGNOSIS — E876 Hypokalemia: Secondary | ICD-10-CM | POA: Diagnosis present

## 2018-12-17 DIAGNOSIS — I340 Nonrheumatic mitral (valve) insufficiency: Secondary | ICD-10-CM | POA: Diagnosis not present

## 2018-12-17 DIAGNOSIS — J181 Lobar pneumonia, unspecified organism: Secondary | ICD-10-CM | POA: Diagnosis not present

## 2018-12-17 DIAGNOSIS — Z86711 Personal history of pulmonary embolism: Secondary | ICD-10-CM

## 2018-12-17 DIAGNOSIS — I110 Hypertensive heart disease with heart failure: Secondary | ICD-10-CM | POA: Diagnosis present

## 2018-12-17 DIAGNOSIS — I428 Other cardiomyopathies: Secondary | ICD-10-CM | POA: Diagnosis present

## 2018-12-17 DIAGNOSIS — R042 Hemoptysis: Secondary | ICD-10-CM | POA: Diagnosis present

## 2018-12-17 DIAGNOSIS — I5043 Acute on chronic combined systolic (congestive) and diastolic (congestive) heart failure: Secondary | ICD-10-CM | POA: Diagnosis present

## 2018-12-17 DIAGNOSIS — N179 Acute kidney failure, unspecified: Secondary | ICD-10-CM | POA: Diagnosis not present

## 2018-12-17 DIAGNOSIS — I5032 Chronic diastolic (congestive) heart failure: Secondary | ICD-10-CM | POA: Diagnosis not present

## 2018-12-17 DIAGNOSIS — R05 Cough: Secondary | ICD-10-CM | POA: Diagnosis not present

## 2018-12-17 DIAGNOSIS — I5041 Acute combined systolic (congestive) and diastolic (congestive) heart failure: Secondary | ICD-10-CM | POA: Diagnosis not present

## 2018-12-17 DIAGNOSIS — J9601 Acute respiratory failure with hypoxia: Secondary | ICD-10-CM | POA: Diagnosis present

## 2018-12-17 DIAGNOSIS — J189 Pneumonia, unspecified organism: Secondary | ICD-10-CM | POA: Diagnosis present

## 2018-12-17 DIAGNOSIS — Z793 Long term (current) use of hormonal contraceptives: Secondary | ICD-10-CM

## 2018-12-17 DIAGNOSIS — R778 Other specified abnormalities of plasma proteins: Secondary | ICD-10-CM | POA: Diagnosis present

## 2018-12-17 DIAGNOSIS — R7989 Other specified abnormal findings of blood chemistry: Secondary | ICD-10-CM

## 2018-12-17 DIAGNOSIS — I471 Supraventricular tachycardia: Secondary | ICD-10-CM | POA: Diagnosis not present

## 2018-12-17 DIAGNOSIS — R079 Chest pain, unspecified: Secondary | ICD-10-CM | POA: Diagnosis not present

## 2018-12-17 DIAGNOSIS — I313 Pericardial effusion (noninflammatory): Secondary | ICD-10-CM | POA: Diagnosis present

## 2018-12-17 DIAGNOSIS — I2699 Other pulmonary embolism without acute cor pulmonale: Secondary | ICD-10-CM | POA: Diagnosis present

## 2018-12-17 DIAGNOSIS — I361 Nonrheumatic tricuspid (valve) insufficiency: Secondary | ICD-10-CM | POA: Diagnosis not present

## 2018-12-17 DIAGNOSIS — I5021 Acute systolic (congestive) heart failure: Secondary | ICD-10-CM

## 2018-12-17 DIAGNOSIS — R570 Cardiogenic shock: Secondary | ICD-10-CM | POA: Diagnosis not present

## 2018-12-17 DIAGNOSIS — I429 Cardiomyopathy, unspecified: Secondary | ICD-10-CM | POA: Diagnosis not present

## 2018-12-17 HISTORY — DX: Personal history of pulmonary embolism: Z86.711

## 2018-12-17 LAB — BASIC METABOLIC PANEL
ANION GAP: 7 (ref 5–15)
BUN: 11 mg/dL (ref 6–20)
CALCIUM: 8.2 mg/dL — AB (ref 8.9–10.3)
CO2: 23 mmol/L (ref 22–32)
CREATININE: 1.33 mg/dL — AB (ref 0.44–1.00)
Chloride: 106 mmol/L (ref 98–111)
GFR calc Af Amer: >60 mL/min (ref >60)
GFR, EST NON AFRICAN AMERICAN: 53 mL/min — AB (ref >60)
GLUCOSE: 120 mg/dL — AB (ref 70–99)
Potassium: 3.2 mmol/L — ABNORMAL LOW (ref 3.5–5.1)
Sodium: 136 mmol/L (ref 135–145)

## 2018-12-17 LAB — CBC
HCT: 35.9 % — ABNORMAL LOW (ref 36.0–46.0)
Hemoglobin: 10.5 g/dL — ABNORMAL LOW (ref 12.0–15.0)
MCH: 22.2 pg — AB (ref 26.0–34.0)
MCHC: 29.2 g/dL — ABNORMAL LOW (ref 30.0–36.0)
MCV: 75.9 fL — ABNORMAL LOW (ref 80.0–100.0)
Platelets: 264 10*3/uL (ref 150–400)
RBC: 4.73 MIL/uL (ref 3.87–5.11)
RDW: 16.3 % — AB (ref 11.5–15.5)
WBC: 13.7 10*3/uL — ABNORMAL HIGH (ref 4.0–10.5)
nRBC: 0 % (ref 0.0–0.2)

## 2018-12-17 LAB — I-STAT BETA HCG BLOOD, ED (MC, WL, AP ONLY): I-stat hCG, quantitative: <5 m[IU]/mL (ref <5)

## 2018-12-17 LAB — I-STAT TROPONIN, ED: TROPONIN I, POC: 0.09 ng/mL — AB (ref 0.00–0.08)

## 2018-12-17 LAB — TROPONIN I: Troponin I: 0.14 ng/mL (ref <0.03)

## 2018-12-17 MED ORDER — HEPARIN (PORCINE) 25000 UT/250ML-% IV SOLN
2100.0000 [IU]/h | INTRAVENOUS | Status: DC
  Administered 2018-12-17: 1400 [IU]/h via INTRAVENOUS
  Administered 2018-12-18: 1650 [IU]/h via INTRAVENOUS
  Administered 2018-12-19: 1700 [IU]/h via INTRAVENOUS
  Administered 2018-12-20 – 2018-12-21 (×4): 1900 [IU]/h via INTRAVENOUS
  Administered 2018-12-22 – 2018-12-24 (×3): 2100 [IU]/h via INTRAVENOUS
  Filled 2018-12-17 (×11): qty 250

## 2018-12-17 MED ORDER — SODIUM CHLORIDE 0.9 % IV SOLN
500.0000 mg | INTRAVENOUS | Status: DC
  Administered 2018-12-18 (×2): 500 mg via INTRAVENOUS
  Filled 2018-12-17 (×3): qty 500

## 2018-12-17 MED ORDER — SODIUM CHLORIDE 0.9% FLUSH: 3.0000 mL | Freq: Once | INTRAVENOUS | Status: DC

## 2018-12-17 MED ORDER — IOHEXOL 350 MG/ML SOLN
75.0000 mL | Freq: Once | INTRAVENOUS | Status: AC | PRN
  Administered 2018-12-17: 75 mL via INTRAVENOUS

## 2018-12-17 MED ORDER — SODIUM CHLORIDE 0.9 % IV SOLN
2.0000 g | INTRAVENOUS | Status: DC
  Administered 2018-12-17: 2 g via INTRAVENOUS
  Filled 2018-12-17: qty 20

## 2018-12-17 MED ORDER — HEPARIN BOLUS VIA INFUSION
6000.0000 [IU] | Freq: Once | INTRAVENOUS | Status: AC
  Administered 2018-12-17: 6000 [IU] via INTRAVENOUS
  Filled 2018-12-17: qty 6000

## 2018-12-17 MED ORDER — MORPHINE SULFATE (PF) 2 MG/ML IV SOLN
1.0000 mg | INTRAVENOUS | Status: DC | PRN
  Administered 2018-12-17: 1 mg via INTRAVENOUS
  Filled 2018-12-17: qty 1

## 2018-12-17 MED ORDER — POTASSIUM CHLORIDE CRYS ER 20 MEQ PO TBCR
40.0000 meq | EXTENDED_RELEASE_TABLET | Freq: Once | ORAL | Status: AC
  Administered 2018-12-17: 40 meq via ORAL
  Filled 2018-12-17: qty 2

## 2018-12-17 NOTE — H&P (Addendum)
[name] [name] VQM:[id number] DOB: 1986-05-24 DOA: 12/17/2018     PCP: Fanny Bien, MD   Outpatient Specialists:  CARDS:   Dr. Dorris Carnes   Patient arrived to ER on 12/17/18 at 1933  Patient coming from: home Lives With family   Chief Complaint:  Chief Complaint  Patient presents with  . Leg Swelling  . Hemoptysis    HPI: [name] [name] is a 33 y.o. female with medical history significant of pregnancy induced HTN.  Class III severe obesity with BMI between 93-26.7, diastolic CHF per echogram in 2016    Presented with lower extremity and abdominal swelling and hemoptysis.  Increased shortness of breath.  Reports similar to prior during her last admission December.  She have not had any fevers.  She also endorsing chest pain while in triage.  Patient ran out of lisinopril that she has been taking for weeks.  Also ran out of amlodipine.  But states that was more recent.  Otherwise she has been taking her other medications.  She have had increased salt intake recently She was seen by cardiologist yesterday in the office blood pressure at the time was 128/100 pulse 83 weight 107.4 kg She was noted to have 2+ lower extremity edema  Neurology switch her chlorthalidone to Lasix 40 mg a day and increase carvedilol up to 25 mg twice a day.  Last admission for CHF/pneumonia was in December when she was admitted at [facility]. She was started at the time on Colchicine Was told that she may have decreased cardiac function and some sort of inflammation. After her discharge she had first felt better over the past 2 weeks she started to do worse  Reports no travel outside of [place] her husband is a Administrator and travels all over the country.       While in ER: Noted to have elevated troponin up to 0.09 The following Work up has been ordered so far:  Orders Placed This Encounter  Procedures  . DG Chest 2 View  . CT Angio Chest PE W and/or Wo Contrast  . Basic  metabolic panel  . CBC  . Troponin I - Once-Timed  . Cardiac monitoring  . Saline Lock IV, Maintain IV access  . Consult to hospitalist  [phone]  . Pulse oximetry, continuous  . I-stat troponin, ED  . I-Stat beta hCG blood, ED  . ED EKG  . EKG 12-Lead    Following Medications were ordered in ER: Medications  sodium chloride flush (NS) 0.9 % injection 3 mL (has no administration in time range)  potassium chloride SA (K-DUR,KLOR-CON) CR tablet 40 mEq (40 mEq Oral Given 12/17/18 2128)    Significant initial  Findings: Abnormal Labs Reviewed  BASIC METABOLIC PANEL - Abnormal; Notable for the following components:      Result Value   Potassium 3.2 (*)    Glucose, Bld 120 (*)    Creatinine, Ser 1.33 (*)    Calcium 8.2 (*)    GFR calc non Af Amer 53 (*)    All other components within normal limits  CBC - Abnormal; Notable for the following components:   WBC 13.7 (*)    Hemoglobin 10.5 (*)    HCT 35.9 (*)    MCV 75.9 (*)    MCH 22.2 (*)    MCHC 29.2 (*)    RDW 16.3 (*)    All other components within normal limits  I-STAT TROPONIN, ED - Abnormal; Notable for the  following components:   Troponin i, poc 0.09 (*)    All other components within normal limits   Lactic Acid, Venous No results found for: LATICACIDVEN  Na 136  K 3.2   Cr    Up from baseline see below Lab Results  Component Value Date   CREATININE 1.33 (H) 12/17/2018   CREATININE 1.12 (H) 12/16/2018   CREATININE 0.99 08/31/2018  WBC 13.7   HG/HCT  stable,      Component Value Date/Time   HGB 10.5 (L) 12/17/2018 2014   HGB 10.4 (L) 12/16/2018 1150   HGB 11.3 08/07/2015   HCT 35.9 (L) 12/17/2018 2014   HCT 34.3 12/16/2018 1150   HCT 32 08/07/2015    Troponin (Point of Care Test) Recent Labs    12/17/18 2020  TROPIPOC 0.09*    BNP (last 3 results) No results for input(s): BNP in the last 8760 hours.  ProBNP (last 3 results) Recent Labs    12/16/18 1150  PROBNP 3,613*    UA not ordered      CXR - RML dilatation concerning for pneumonia  CTA Acute segmental RIGHT middle and RIGHT lower lobe occlusive pulmonary emboli   RIGHT middle RIGHT lower lobe airspace opacities most consistent with pneumonia  ECG:  Personally reviewed by me showing: HR : 111  Rhythm:    Sinus tachycardia   nonspecific changes    QTC 451     ED Triage Vitals  Enc Vitals Group     BP 12/17/18 1944 (!) 148/110     Pulse Rate 12/17/18 1944 (!) 119     Resp 12/17/18 1944 (!) 24     Temp 12/17/18 1944 99.8 F (37.7 C)     Temp Source 12/17/18 1944 Oral     SpO2 12/17/18 1943 100 %     Weight --      Height 12/17/18 1945 5' 3.5" (1.613 m)     Head Circumference --      Peak Flow --      Pain Score 12/17/18 1945 9     Pain Loc --      Pain Edu? --      Excl. in [place]? --   TMAX(24)@       Latest  Blood pressure (!) 141/94, pulse (!) 105, temperature 99.8 F (37.7 C), temperature source Oral, resp. rate 20, height 5' 3.5" (1.613 m), SpO2 99 %.    Hospitalist was called for admission for CHF exacerbation/RML PNA,  Elevated trop   Review of Systems:    Pertinent positives include:  fatigue, chest pain, hemoptysis shortness of breath at rest.  dyspnea on exertion  coughing up of blood. Constitutional:  No weight loss, night sweats, Fevers, chillsweight loss  HEENT:  No headaches, Difficulty swallowing,Tooth/dental problems,Sore throat,  No sneezing, itching, ear ache, nasal congestion, post nasal drip,  Cardio-vascular:  No  Orthopnea, PND, anasarca, dizziness, palpitations.no Bilateral lower extremity swelling  GI:  No heartburn, indigestion, abdominal pain, nausea, vomiting, diarrhea, change in bowel habits, loss of appetite, melena, blood in stool, hematemesis Resp:   , No excess mucus, no productive cough, No non-productive cough, NoNo change in color of mucus.No wheezing. Skin:  no rash or lesions. No jaundice GU:  no dysuria, change in color of urine, no urgency or frequency. No  straining to urinate.  No flank pain.  Musculoskeletal:  No joint pain or no joint swelling. No decreased range of motion. No back pain.  Psych:  No change in  mood or affect. No depression or anxiety. No memory loss.  Neuro: no localizing neurological complaints, no tingling, no weakness, no double vision, no gait abnormality, no slurred speech, no confusion  All systems reviewed and apart from [place] all are negative  Past Medical History:   Past Medical History:  Diagnosis Date  . Condyloma acuminata   . HTN in pregnancy, chronic 09/24/2015   [place] labs: [x ]Cr, [x]  24hr protein (208) P:Cr 92 Plat: 207 AST/ALT: 10/10    . Hypertension   . Obesity in pregnancy    Antepartum, first trimester  . URI (upper respiratory infection)       Past Surgical History:  Procedure Laterality Date  . BIRTH CONTROL IMPLANT Left 03/2016   "Nexplanon in my upper arm"  . CESAREAN SECTION N/A 02/25/2016   Procedure: CESAREAN SECTION;  Surgeon: Truett Mainland, DO;  Location: [place];  Service: Obstetrics;  Laterality: N/A;    Social History:  Ambulatory   Independently     reports that she has never smoked. She has never used smokeless tobacco. She reports that she does not drink alcohol or use drugs.     Family History:   Family History  Problem Relation Age of Onset  . Hypertension Mother   . Heart disease Mother     Allergies: No Known Allergies   Prior to Admission medications   Medication Sig Start Date End Date Taking? Authorizing Provider  amLODipine (NORVASC) 10 MG tablet Take 1 tablet (10 mg total) by mouth daily. 12/16/18  Yes Daune Perch, NP  carvedilol (COREG) 25 MG tablet Take 1 tablet (25 mg total) by mouth 2 (two) times daily. 12/16/18  Yes Daune Perch, NP  colchicine 0.6 MG tablet Take 0.6 mg by mouth daily.   Yes [provider]  ferrous sulfate 325 (65 FE) MG tablet Take 1 tablet (325 mg total) by mouth daily with breakfast. 08/31/18  Yes  Seawell, Jaimie A, DO  furosemide (LASIX) 40 MG tablet Take 1 tablet (40 mg total) by mouth daily. 12/16/18  Yes Daune Perch, NP  lisinopril (PRINIVIL,ZESTRIL) 40 MG tablet Take 1 tablet (40 mg total) by mouth daily. 12/16/18  Yes Daune Perch, NP  spironolactone (ALDACTONE) 25 MG tablet Take 25 mg by mouth daily.   Yes [provider]  etonogestrel (NEXPLANON) 68 MG IMPL implant 1 each by Subdermal route once.    [provider]   Physical Exam: Blood pressure (!) 141/94, pulse (!) 105, temperature 99.8 F (37.7 C), temperature source Oral, resp. rate 20, height 5' 3.5" (1.613 m), SpO2 99 %. 1. General:  in  Acute distress increased work of breathing  complaining of severe pain   acutely ill -appearing 2. Psychological: Alert and  Oriented 3. Head/ENT:     Dry Mucous Membranes                          Head Non traumatic, neck supple                           Poor Dentition 4. SKIN:   decreased Skin turgor,  Skin clean Dry and intact no rash 5. Heart: Regular rate and rhythm no  Murmur, no Rub or gallop 6. Lungs:  no wheezes some crackles   7. Abdomen: Soft,  non-tender,  distended  obese   bowel sounds present 8. Lower extremities: no clubbing, cyanosis, trace edema 9.  Neurologically Grossly intact, moving all 4 extremities equally   10. MSK: Normal range of motion   LABS:     Recent Labs  Lab 12/16/18 1150 12/17/18 2014  WBC 7.1 13.7*  HGB 10.4* 10.5*  HCT 34.3 35.9*  MCV 76* 75.9*  PLT 239 016   Basic Metabolic Panel: Recent Labs  Lab 12/16/18 1150 12/17/18 2014  NA 138 136  K 3.8 3.2*  CL 104 106  CO2 20 23  GLUCOSE 79 120*  BUN 16 11  CREATININE 1.12* 1.33*  CALCIUM 8.4* 8.2*      No results for input(s): AST, ALT, ALKPHOS, BILITOT, PROT, ALBUMIN in the last 168 hours. No results for input(s): LIPASE, AMYLASE in the last 168 hours. No results for input(s): AMMONIA in the last 168 hours.    HbA1C: No results for input(s): HGBA1C in  the last 72 hours. CBG: No results for input(s): GLUCAP in the last 168 hours.    Urine analysis:    Component Value Date/Time   COLORURINE YELLOW 08/29/2018 1120   APPEARANCEUR CLEAR 08/29/2018 1120   LABSPEC >1.046 (H) 08/29/2018 1120   PHURINE 6.0 08/29/2018 1120   GLUCOSEU NEGATIVE 08/29/2018 1120   HGBUR NEGATIVE 08/29/2018 1120   BILIRUBINUR NEGATIVE 08/29/2018 1120   KETONESUR NEGATIVE 08/29/2018 1120   PROTEINUR 100 (A) 08/29/2018 1120   UROBILINOGEN 1.0 02/21/2016 1057   NITRITE NEGATIVE 08/29/2018 1120   LEUKOCYTESUR NEGATIVE 08/29/2018 1120    Cultures:    Component Value Date/Time   SDES OB CLEAN CATCH 12/29/2015 0905   SPECREQUEST NONE 12/29/2015 0905   CULT  12/29/2015 0905    MULTIPLE SPECIES PRESENT, SUGGEST RECOLLECTION 1,000 COLONIES/mL GROUP B STREP(S.AGALACTIAE)ISOLATED TESTING AGAINST S. AGALACTIAE NOT ROUTINELY PERFORMED DUE TO PREDICTABILITY OF AMP/PEN/VAN SUSCEPTIBILITY. Performed at [place] 01/01/2016 FINAL 12/29/2015 0905     Radiological Exams on Admission: Dg Chest 2 View  Result Date: 12/17/2018 CLINICAL DATA:  Increased swelling. Hemoptysis. Difficulty breathing. Chest pain. EXAM: CHEST - 2 VIEW COMPARISON:  CT of the abdomen and pelvis 08/29/2018 FINDINGS: Heart is enlarged, stable. Mild pulmonary vascular congestion is present. Right middle lobe pneumonia is present. A right pleural effusion is evident. Minimal atelectasis is present at the left base. IMPRESSION: 1. Right middle lobe pneumonia. 2. Right pleural effusion. 3. Cardiomegaly without failure. Electronically Signed   By: San Morelle M.D.   On: 12/17/2018 20:37    Chart has been reviewed    Assessment/Plan  33 y.o. female with medical history significant of pregnancy induced HTN.  Class III severe obesity with BMI between 01-09.3, diastolic CHF per echogram in 2016  Admitted for HCAP, hemoptysis, PE  Present on Admission: . HCAP  (healthcare-associated pneumonia) - will admit for treatment of HCAP  Given: admission within the past 90 days ,   will start on appropriate antibiotic coverage for now and change as needed pending further studies   Obtain:  sputum cultures, MRSA testing                                   blood cultures                   strep pneumo UA antigen,  Provide oxygen as needed.              Admit to stepdown given increased work of breathing           Check HIV status given recurrent pneumonia            -Would probably benefit from rheumatological work-up as well  . Hemoptysis -in the setting of pulmonary embolism and continue to monitor in stepdown  . Pulmonary emboli (HCC) -  Admit to   stepdown Initiate heparin drip  Would likely benefit from case manager consult for long term anticoagulation Hold home blood pressure medications avoid hypotension Cycle cardiac enzymes troponin currently elevated Order echogram and lower extremity Dopplers  Most likely risk factors for hypercoagulable state being  obesity  hormonal therapy  sedentary lifestyle could be underlying rheumatological disorder  Would benefit from hypercoagulable workup as an outpatient Discussed case with PCCM who will see patient in consult.  -History of diastolic CHF -currently underlying etiology more consistent with pneumonia rather than CHF exacerbation.  Will hold off of Lasix for tonight hold home medications for now to support blood pressure Given pulmonary embolism would restart when able to tolerate  Elevated troponin -in the setting of pulmonary embolism will need further work-up to rule out underlying autoimmune disorder would benefit from cardiology input    Other plan as per orders.  DVT prophylaxis:  Heparin   Code Status:  FULL CODE    Family Communication:   Family   at  Bedside  plan of care was discussed with  Husband,   Disposition Plan:        To home once workup is  complete and patient is stable                                     Consults called: Pulmonology  Consulted will see in ER, please consult cardiology in AM  Admission status:    inpatient     Expect 2 midnight stay secondary to severity of patient's current illness including   hemodynamic instability despite optimal treatment (tachycardia  Tachypnea )  Severe lab/radiological/exam abnormalities including:   large PNA and PE   and extensive comorbidities including:obesity That are currently affecting medical management.   I expect  patient to be hospitalized for 2 midnights requiring inpatient medical care.  Patient is at high risk for adverse outcome (such as loss of life or disability) if not treated.  Indication for inpatient stay as follows:    Hemodynamic instability despite maximal medical therapy,    severe pain requiring acute inpatient management,     persistent chest pain despite medical management     Need for IV antibiotics, IV fluids, I IV pain medications      Level of care        SDU tele indefinitely please discontinue once patient no longer qualifies      Shandora Koogler 12/18/2018, 12:05 AM    [facility]     after 2 AM please page floor coverage PA If 7AM-7PM, please contact the day team taking care of the patient using [email or web address]

## 2018-12-17 NOTE — ED Notes (Signed)
Dr. Reather Converse notified of Trop results.

## 2018-12-17 NOTE — ED Triage Notes (Addendum)
Patient reports noticing swelling from abdomen down lower extremeties for two days and coughing up blood since Wednesday night.  Also having some labored breathing in triage.  Hx of pneumonia this past December. Denies having any fever.  Later in triage also c/o chest pain and I visualized patient cough up blood tinged sputum.

## 2018-12-17 NOTE — Progress Notes (Signed)
ANTICOAGULATION CONSULT NOTE - Initial Consult  Pharmacy Consult for heparin Indication: pulmonary embolus  No Known Allergies  Patient Measurements: Height: 5' 3.5" (161.3 cm) IBW/kg (Calculated) : 53.55 Heparin Dosing Weight: 78.1  Vital Signs: Temp: 99.8 F (37.7 C) (03/06 1944) Temp Source: Oral (03/06 1944) BP: 118/68 (03/06 2300) Pulse Rate: 108 (03/06 2200)  Labs: Recent Labs    12/16/18 1150 12/17/18 2014 12/17/18 2215  HGB 10.4* 10.5*  --   HCT 34.3 35.9*  --   PLT 239 264  --   CREATININE 1.12* 1.33*  --   TROPONINI  --   --  0.14*    Estimated Creatinine Clearance: 72.7 mL/min (A) (by C-G formula based on SCr of 1.33 mg/dL (H)).   Medical History: Past Medical History:  Diagnosis Date  . Condyloma acuminata   . HTN in pregnancy, chronic 09/24/2015   [place] labs: [x ]Cr, [x]  24hr protein (208) P:Cr 92 Plat: 207 AST/ALT: 10/10    . Hypertension   . Obesity in pregnancy    Antepartum, first trimester  . URI (upper respiratory infection)     Medications:  See medication history  Assessment: 33 yo lady to start heparin for PE.  No evidence of right heart strain.  She was not on anticoagulation PTA.  Baseline Hg 10.5, PTLC 264 Goal of Therapy:  Heparin level 0.3-0.7 units/ml Monitor platelets by anticoagulation protocol: Yes   Plan:  Heparin 6000 unit bolus and drip at 1400 units/hr Check heparin level 6-8 hours after start then daily while on heparin CBC daily Monitor for bleeding complications  Thanks for allowing pharmacy to be a part of this patient's care.  Excell Seltzer, PharmD Clinical Pharmacist 12/17/2018,11:12 PM

## 2018-12-17 NOTE — ED Notes (Signed)
Dr. Ashok Cordia notified of Trop.

## 2018-12-17 NOTE — ED Notes (Addendum)
Sort RN Santiago Glad informed of pt troponin results .09

## 2018-12-17 NOTE — ED Notes (Signed)
Provider made aware of troponin.

## 2018-12-17 NOTE — ED Provider Notes (Addendum)
[facility] EMERGENCY DEPARTMENT Provider Note   CSN: [id number] Arrival date & time: 12/17/18  1933    History   Chief Complaint Chief Complaint  Patient presents with  . Leg Swelling  . Hemoptysis    HPI [name] [name] is a 33 y.o. female w/ PMH of hypertension presenting with chest pain, leg swelling and hemoptysis. She was in her usual state of health until 12/14/18 when she began to notice increase lower extremity swelling. The next day she began to have significant coughing with productive sputum, nausea, poor appetite and malaise. Today she noticed bloody flecks in her sputum and came to ED for evaluation. She denies any recent surgery, hx of long flights/car rides, smoking history. She is currently on etonogestrel for birth control.  Past Medical History:  Diagnosis Date  . Condyloma acuminata   . HTN in pregnancy, chronic 09/24/2015   [place] labs: [x ]Cr, [x]  24hr protein (208) P:Cr 92 Plat: 207 AST/ALT: 10/10    . Hypertension   . Obesity in pregnancy    Antepartum, first trimester  . URI (upper respiratory infection)     Patient Active Problem List   Diagnosis Date Noted  . Abdominal pain 08/29/2018  . Hypertension 10/29/2016  . Hypertensive urgency 04/03/2016    Past Surgical History:  Procedure Laterality Date  . BIRTH CONTROL IMPLANT Left 03/2016   "Nexplanon in my upper arm"  . CESAREAN SECTION N/A 02/25/2016   Procedure: CESAREAN SECTION;  Surgeon: Truett Mainland, DO;  Location: [facility];  Service: Obstetrics;  Laterality: N/A;     OB History    Gravida  1   Para  1   Term  1   Preterm      AB      Living  1     SAB      TAB      Ectopic      Multiple  0   Live Births  1            Home Medications    Prior to Admission medications   Medication Sig Start Date End Date Taking? Authorizing Provider  amLODipine (NORVASC) 10 MG tablet Take 1 tablet (10 mg total) by mouth daily. 12/16/18  Yes Daune Perch, NP  carvedilol (COREG) 25 MG tablet Take 1 tablet (25 mg total) by mouth 2 (two) times daily. 12/16/18  Yes Daune Perch, NP  colchicine 0.6 MG tablet Take 0.6 mg by mouth daily.   Yes [provider]  ferrous sulfate 325 (65 FE) MG tablet Take 1 tablet (325 mg total) by mouth daily with breakfast. 08/31/18  Yes Seawell, Jaimie A, DO  furosemide (LASIX) 40 MG tablet Take 1 tablet (40 mg total) by mouth daily. 12/16/18  Yes Daune Perch, NP  lisinopril (PRINIVIL,ZESTRIL) 40 MG tablet Take 1 tablet (40 mg total) by mouth daily. 12/16/18  Yes Daune Perch, NP  spironolactone (ALDACTONE) 25 MG tablet Take 25 mg by mouth daily.   Yes [provider]  etonogestrel (NEXPLANON) 68 MG IMPL implant 1 each by Subdermal route once.    [provider]    Family History Family History  Problem Relation Age of Onset  . Hypertension Mother   . Heart disease Mother     Social History Social History   Tobacco Use  . Smoking status: Never Smoker  . Smokeless tobacco: Never Used  Substance Use Topics  . Alcohol use: Never    Frequency: Never  .  Drug use: Never     Allergies   Patient has no known allergies.   Review of Systems Review of Systems   Physical Exam Updated Vital Signs BP (!) 134/93   Pulse (!) 108   Temp 99.8 F (37.7 C) (Oral)   Resp (!) 37   Ht 5' 3.5" (1.613 m)   SpO2 100%   BMI 41.29 kg/m   Physical Exam Constitutional:      General: She is not in acute distress.    Appearance: She is obese.  HENT:     Head: Normocephalic and atraumatic.  Eyes:     Conjunctiva/sclera: Conjunctivae normal.  Neck:     Musculoskeletal: Normal range of motion and neck supple.  Cardiovascular:     Rate and Rhythm: Normal rate and regular rhythm.     Pulses: Normal pulses.     Heart sounds: Normal heart sounds.  Pulmonary:     Effort: Pulmonary effort is normal.     Breath sounds: Normal breath sounds. No wheezing or rales.  Chest:      Chest wall: Tenderness (right sided chest wall tenderness to palpation) present.  Abdominal:     General: Abdomen is flat. Bowel sounds are normal. There is no distension.     Tenderness: There is no abdominal tenderness.  Musculoskeletal: Normal range of motion.        General: Tenderness (Left calf tenderness to palpation) present.     Right lower leg: Edema (2+ up to upper shin) present.     Left lower leg: Edema (2+ to upper shin) present.  Skin:    General: Skin is warm and dry.     Coloration: Skin is not jaundiced.  Neurological:     Mental Status: She is alert.     ED Treatments / Results  Labs (all labs ordered are listed, but only abnormal results are displayed) Labs Reviewed  BASIC METABOLIC PANEL - Abnormal; Notable for the following components:      Result Value   Potassium 3.2 (*)    Glucose, Bld 120 (*)    Creatinine, Ser 1.33 (*)    Calcium 8.2 (*)    GFR calc non Af Amer 53 (*)    All other components within normal limits  CBC - Abnormal; Notable for the following components:   WBC 13.7 (*)    Hemoglobin 10.5 (*)    HCT 35.9 (*)    MCV 75.9 (*)    MCH 22.2 (*)    MCHC 29.2 (*)    RDW 16.3 (*)    All other components within normal limits  TROPONIN I - Abnormal; Notable for the following components:   Troponin I 0.14 (*)    All other components within normal limits  I-STAT TROPONIN, ED - Abnormal; Notable for the following components:   Troponin i, poc 0.09 (*)    All other components within normal limits  I-STAT BETA HCG BLOOD, ED ([facility], [facility], [facility] ONLY)    EKG EKG Interpretation  Date/Time:  Friday December 17 2018 19:45:24 EST Ventricular Rate:  111 PR Interval:  168 QRS Duration: 68 QT Interval:  332 QTC Calculation: 451 R Axis:   26 Text Interpretation:  Sinus tachycardia Biatrial enlargement Low voltage QRS Non-specific ST-t changes Confirmed by Lajean Saver [phone]) on 12/17/2018 8:56:33 PM  Personally reviewed, sinus tachycardia, normal axis,  T-wave flattening in inferior and lateral leads.   Radiology Dg Chest 2 View  Result Date: 12/17/2018 CLINICAL DATA:  Increased swelling.  Hemoptysis. Difficulty breathing. Chest pain. EXAM: CHEST - 2 VIEW COMPARISON:  CT of the abdomen and pelvis 08/29/2018 FINDINGS: Heart is enlarged, stable. Mild pulmonary vascular congestion is present. Right middle lobe pneumonia is present. A right pleural effusion is evident. Minimal atelectasis is present at the left base. IMPRESSION: 1. Right middle lobe pneumonia. 2. Right pleural effusion. 3. Cardiomegaly without failure. Electronically Signed   By: San Morelle M.D.   On: 12/17/2018 20:37    Procedures Procedures (including critical care time)  Medications Ordered in ED Medications  sodium chloride flush (NS) 0.9 % injection 3 mL (0 mLs Intravenous Hold 12/17/18 2247)  cefTRIAXone (ROCEPHIN) 2 g in sodium chloride 0.9 % 100 mL IVPB (has no administration in time range)  azithromycin (ZITHROMAX) 500 mg in sodium chloride 0.9 % 250 mL IVPB (has no administration in time range)  potassium chloride SA (K-DUR,KLOR-CON) CR tablet 40 mEq (40 mEq Oral Given 12/17/18 2128)  iohexol (OMNIPAQUE) 350 MG/ML injection 75 mL (75 mLs Intravenous Contrast Given 12/17/18 2220)    Initial Impression / Assessment and Plan / ED Course  I have reviewed the triage vital signs and the nursing notes.  Pertinent labs & imaging results that were available during my care of the patient were reviewed by me and considered in my medical decision making (see chart for details).    Ms.[name] is 33 yo F w/ PMH of HTN presenting with lower extremity edema and hemoptysis. Chest X-ray show RML consolidation concerning for pneumonia and she has leukocytosis on CBC. She also has troponemia which may be demand but with complaints of hemoptysis and calf tenderness, concerning for pulmonary embolism.  Will get CTA and consult to medicine.  CTA showing right middle lobe consolidation  and small segmental PE  Final Clinical Impressions(s) / ED Diagnoses   Final diagnoses:  Community acquired pneumonia of right middle lobe of lung ([place])  Pulmonary embolism without acute cor pulmonale, unspecified chronicity, unspecified pulmonary embolism type ([place])   Ms.Jimmye Norman is 33 yo F w/ PMH of HTN presenting with lower extremity edema and hemoptysis with findings of right lobar consolidation with pulmonary embolism on CTA. May have underlying rheumatologic disorder considering significant cardiomegaly and pulmonary embolisms.  ED Discharge Orders    None       Mosetta Anis, MD 12/17/18 2307    Mosetta Anis, MD 12/17/18 2310    Lajean Saver, MD 12/18/18 1507    Lajean Saver, MD 12/18/18 [phone]

## 2018-12-18 ENCOUNTER — Inpatient Hospital Stay (HOSPITAL_COMMUNITY): Payer: 59

## 2018-12-18 ENCOUNTER — Other Ambulatory Visit: Payer: Self-pay

## 2018-12-18 ENCOUNTER — Encounter (HOSPITAL_COMMUNITY): Payer: Self-pay

## 2018-12-18 DIAGNOSIS — I2699 Other pulmonary embolism without acute cor pulmonale: Secondary | ICD-10-CM

## 2018-12-18 DIAGNOSIS — D509 Iron deficiency anemia, unspecified: Secondary | ICD-10-CM

## 2018-12-18 DIAGNOSIS — J181 Lobar pneumonia, unspecified organism: Secondary | ICD-10-CM

## 2018-12-18 DIAGNOSIS — R091 Pleurisy: Secondary | ICD-10-CM

## 2018-12-18 DIAGNOSIS — J189 Pneumonia, unspecified organism: Secondary | ICD-10-CM

## 2018-12-18 DIAGNOSIS — I5021 Acute systolic (congestive) heart failure: Secondary | ICD-10-CM

## 2018-12-18 DIAGNOSIS — I361 Nonrheumatic tricuspid (valve) insufficiency: Secondary | ICD-10-CM

## 2018-12-18 DIAGNOSIS — I340 Nonrheumatic mitral (valve) insufficiency: Secondary | ICD-10-CM

## 2018-12-18 DIAGNOSIS — I5032 Chronic diastolic (congestive) heart failure: Secondary | ICD-10-CM | POA: Diagnosis present

## 2018-12-18 DIAGNOSIS — N179 Acute kidney failure, unspecified: Secondary | ICD-10-CM

## 2018-12-18 DIAGNOSIS — R7989 Other specified abnormal findings of blood chemistry: Secondary | ICD-10-CM

## 2018-12-18 DIAGNOSIS — R778 Other specified abnormalities of plasma proteins: Secondary | ICD-10-CM | POA: Diagnosis present

## 2018-12-18 LAB — POCT I-STAT EG7
Acid-Base Excess: 1 mmol/L (ref 0.0–2.0)
Bicarbonate: 23.8 mmol/L (ref 20.0–28.0)
CALCIUM ION: 1.04 mmol/L — AB (ref 1.15–1.40)
HEMATOCRIT: 36 % (ref 36.0–46.0)
Hemoglobin: 12.2 g/dL (ref 12.0–15.0)
O2 Saturation: 94 %
Patient temperature: 37
Potassium: 5.4 mmol/L — ABNORMAL HIGH (ref 3.5–5.1)
Sodium: 136 mmol/L (ref 135–145)
TCO2: 25 mmol/L (ref 22–32)
pCO2, Ven: 31.9 mmHg — ABNORMAL LOW (ref 44.0–60.0)
pH, Ven: 7.48 — ABNORMAL HIGH (ref 7.250–7.430)
pO2, Ven: 63 mmHg — ABNORMAL HIGH (ref 32.0–45.0)

## 2018-12-18 LAB — COMPREHENSIVE METABOLIC PANEL
ALT: 58 U/L — ABNORMAL HIGH (ref 0–44)
AST: 25 U/L (ref 15–41)
Albumin: 2.3 g/dL — ABNORMAL LOW (ref 3.5–5.0)
Alkaline Phosphatase: 36 U/L — ABNORMAL LOW (ref 38–126)
Anion gap: 9 (ref 5–15)
BUN: 10 mg/dL (ref 6–20)
CO2: 25 mmol/L (ref 22–32)
Calcium: 7.9 mg/dL — ABNORMAL LOW (ref 8.9–10.3)
Chloride: 104 mmol/L (ref 98–111)
Creatinine, Ser: 1.26 mg/dL — ABNORMAL HIGH (ref 0.44–1.00)
GFR calc Af Amer: >60 mL/min (ref >60)
GFR calc non Af Amer: 57 mL/min — ABNORMAL LOW (ref >60)
Glucose, Bld: 90 mg/dL (ref 70–99)
Potassium: 3.2 mmol/L — ABNORMAL LOW (ref 3.5–5.1)
Sodium: 138 mmol/L (ref 135–145)
Total Bilirubin: 3.7 mg/dL — ABNORMAL HIGH (ref 0.3–1.2)
Total Protein: 5.2 g/dL — ABNORMAL LOW (ref 6.5–8.1)

## 2018-12-18 LAB — BRAIN NATRIURETIC PEPTIDE: B Natriuretic Peptide: 836.8 pg/mL — ABNORMAL HIGH (ref 0.0–100.0)

## 2018-12-18 LAB — HEPARIN LEVEL (UNFRACTIONATED)
HEPARIN UNFRACTIONATED: 0.15 [IU]/mL — AB (ref 0.30–0.70)
Heparin Unfractionated: 0.11 IU/mL — ABNORMAL LOW (ref 0.30–0.70)
Heparin Unfractionated: 0.31 IU/mL (ref 0.30–0.70)

## 2018-12-18 LAB — TROPONIN I
TROPONIN I: 0.21 ng/mL — AB (ref <0.03)
Troponin I: 0.21 ng/mL (ref <0.03)
Troponin I: 0.16 ng/mL (ref <0.03)

## 2018-12-18 LAB — TSH: TSH: 1.236 u[IU]/mL (ref 0.350–4.500)

## 2018-12-18 LAB — ECHOCARDIOGRAM COMPLETE: HEIGHTINCHES: 63.5 in

## 2018-12-18 LAB — MAGNESIUM: Magnesium: 1.5 mg/dL — ABNORMAL LOW (ref 1.7–2.4)

## 2018-12-18 LAB — PHOSPHORUS: Phosphorus: 3.5 mg/dL (ref 2.5–4.6)

## 2018-12-18 MED ORDER — OXYCODONE HCL 5 MG PO TABS
5.0000 mg | ORAL_TABLET | Freq: Four times a day (QID) | ORAL | Status: DC | PRN
  Administered 2018-12-18 – 2018-12-20 (×5): 5 mg via ORAL
  Filled 2018-12-18 (×5): qty 1

## 2018-12-18 MED ORDER — VANCOMYCIN HCL IN DEXTROSE 750-5 MG/150ML-% IV SOLN
750.0000 mg | Freq: Two times a day (BID) | INTRAVENOUS | Status: DC
  Administered 2018-12-18 – 2018-12-19 (×2): 750 mg via INTRAVENOUS
  Filled 2018-12-18 (×2): qty 150

## 2018-12-18 MED ORDER — MAGNESIUM SULFATE 2 GM/50ML IV SOLN
2.0000 g | Freq: Once | INTRAVENOUS | Status: AC
  Administered 2018-12-18: 2 g via INTRAVENOUS
  Filled 2018-12-18: qty 50

## 2018-12-18 MED ORDER — FUROSEMIDE 10 MG/ML IJ SOLN
40.0000 mg | Freq: Every day | INTRAMUSCULAR | Status: DC
  Administered 2018-12-18: 40 mg via INTRAVENOUS
  Filled 2018-12-18 (×2): qty 4

## 2018-12-18 MED ORDER — VANCOMYCIN HCL 10 G IV SOLR
1500.0000 mg | Freq: Once | INTRAVENOUS | Status: AC
  Administered 2018-12-18: 1500 mg via INTRAVENOUS
  Filled 2018-12-18: qty 1500

## 2018-12-18 MED ORDER — FUROSEMIDE 10 MG/ML IJ SOLN
40.0000 mg | Freq: Once | INTRAMUSCULAR | Status: AC
  Administered 2018-12-18: 40 mg via INTRAVENOUS
  Filled 2018-12-18: qty 4

## 2018-12-18 MED ORDER — ALBUTEROL SULFATE (2.5 MG/3ML) 0.083% IN NEBU: 2.5000 mg | INHALATION_SOLUTION | RESPIRATORY_TRACT | Status: DC | PRN

## 2018-12-18 MED ORDER — HYDRALAZINE HCL 10 MG PO TABS
10.0000 mg | ORAL_TABLET | Freq: Three times a day (TID) | ORAL | Status: DC
  Administered 2018-12-18 – 2018-12-19 (×3): 10 mg via ORAL
  Filled 2018-12-18 (×3): qty 1

## 2018-12-18 MED ORDER — SODIUM CHLORIDE 0.9% FLUSH: 3.0000 mL | INTRAVENOUS | Status: DC | PRN

## 2018-12-18 MED ORDER — SODIUM CHLORIDE 0.9 % IV SOLN: 500.0000 mg | INTRAVENOUS | Status: DC

## 2018-12-18 MED ORDER — MORPHINE SULFATE (PF) 2 MG/ML IV SOLN
1.0000 mg | INTRAVENOUS | Status: DC | PRN
  Administered 2018-12-18: 1 mg via INTRAVENOUS
  Filled 2018-12-18: qty 1

## 2018-12-18 MED ORDER — ONDANSETRON HCL 4 MG PO TABS: 4.0000 mg | ORAL_TABLET | Freq: Four times a day (QID) | ORAL | Status: DC | PRN

## 2018-12-18 MED ORDER — HYDRALAZINE HCL 20 MG/ML IJ SOLN: 5.0000 mg | Freq: Four times a day (QID) | INTRAMUSCULAR | Status: DC | PRN

## 2018-12-18 MED ORDER — POTASSIUM CHLORIDE CRYS ER 20 MEQ PO TBCR
40.0000 meq | EXTENDED_RELEASE_TABLET | Freq: Once | ORAL | Status: AC
  Administered 2018-12-18: 40 meq via ORAL
  Filled 2018-12-18: qty 2

## 2018-12-18 MED ORDER — MORPHINE SULFATE (PF) 2 MG/ML IV SOLN
2.0000 mg | INTRAVENOUS | Status: DC | PRN
  Administered 2018-12-18 – 2018-12-20 (×4): 2 mg via INTRAVENOUS
  Filled 2018-12-18 (×4): qty 1

## 2018-12-18 MED ORDER — ACETAMINOPHEN 325 MG PO TABS
650.0000 mg | ORAL_TABLET | Freq: Four times a day (QID) | ORAL | Status: DC | PRN
  Administered 2018-12-18 – 2018-12-21 (×5): 650 mg via ORAL
  Filled 2018-12-18 (×5): qty 2

## 2018-12-18 MED ORDER — SODIUM CHLORIDE 0.9 % IV SOLN
250.0000 mL | INTRAVENOUS | Status: DC | PRN
  Administered 2018-12-20 – 2018-12-22 (×2): 250 mL via INTRAVENOUS

## 2018-12-18 MED ORDER — ISOSORBIDE MONONITRATE ER 30 MG PO TB24
30.0000 mg | ORAL_TABLET | Freq: Every day | ORAL | Status: DC
  Administered 2018-12-18 – 2018-12-20 (×3): 30 mg via ORAL
  Filled 2018-12-18 (×3): qty 1

## 2018-12-18 MED ORDER — SODIUM CHLORIDE 0.9 % IV SOLN
1.0000 g | Freq: Three times a day (TID) | INTRAVENOUS | Status: DC
  Administered 2018-12-18 – 2018-12-19 (×4): 1 g via INTRAVENOUS
  Filled 2018-12-18 (×5): qty 1

## 2018-12-18 MED ORDER — SODIUM CHLORIDE 0.9% FLUSH
3.0000 mL | Freq: Two times a day (BID) | INTRAVENOUS | Status: DC
  Administered 2018-12-18 – 2018-12-24 (×7): 3 mL via INTRAVENOUS

## 2018-12-18 MED ORDER — ONDANSETRON HCL 4 MG/2ML IJ SOLN
4.0000 mg | Freq: Four times a day (QID) | INTRAMUSCULAR | Status: DC | PRN
  Administered 2018-12-18 – 2018-12-22 (×3): 4 mg via INTRAVENOUS
  Filled 2018-12-18 (×3): qty 2

## 2018-12-18 NOTE — ED Notes (Signed)
Patient care taker asking for patient to have water RN told them patient is not allowed to have water at the moment. He was upset and walked away

## 2018-12-18 NOTE — Progress Notes (Addendum)
ANTICOAGULATION CONSULT NOTE - Initial Consult  Pharmacy Consult for heparin Indication: pulmonary embolus  No Known Allergies  Patient Measurements: Height: 5' 3.5" (161.3 cm) IBW/kg (Calculated) : 53.55 Heparin Dosing Weight: 78.1  Vital Signs: BP: 135/96 (03/07 0930) Pulse Rate: 102 (03/07 0930)  Labs: Recent Labs    12/16/18 1150 12/17/18 2014 12/17/18 2215 12/18/18 0103 12/18/18 0858  HGB 10.4* 10.5*  --  12.2  --   HCT 34.3 35.9*  --  36.0  --   PLT 239 264  --   --   --   HEPARINUNFRC  --   --   --   --  0.15*  CREATININE 1.12* 1.33*  --   --   --   TROPONINI  --   --  0.14*  --   --     Estimated Creatinine Clearance: 72.7 mL/min (A) (by C-G formula based on SCr of 1.33 mg/dL (H)).   Medical History: Past Medical History:  Diagnosis Date  . Condyloma acuminata   . HTN in pregnancy, chronic 09/24/2015   [place] labs: [x ]Cr, [x]  24hr protein (208) P:Cr 92 Plat: 207 AST/ALT: 10/10    . Hypertension   . Obesity in pregnancy    Antepartum, first trimester  . URI (upper respiratory infection)     Medications:  See medication history  Assessment: 33 yo lady to start heparin for PE.  No evidence of right heart strain.  She was not on anticoagulation PTA.  Baseline Hg 10.5, PTLC 264  Initial heparin level subtherapeutic at 0.15, line issues and moved to other arm per RN.    Goal of Therapy:  Heparin level 0.3-0.7 units/ml Monitor platelets by anticoagulation protocol: Yes   Plan:  Continue heparin gtt at 1400 units/hr, re-check level this AM to confirm more accurate heparin level since moving to other site  Bertis Ruddy, PharmD Clinical Pharmacist Please check AMION for all [facility] numbers 12/18/2018 9:49 AM   Addendum: Repeat Heparin level subtherapeutic at 0.11 Increase heparin gtt to 1650 units/hr F/u 8 hour heparin level

## 2018-12-18 NOTE — Progress Notes (Signed)
ANTICOAGULATION CONSULT NOTE  Pharmacy Consult for heparin Indication: pulmonary embolus  No Known Allergies  Patient Measurements: Height: 5' 3.5" (161.3 cm) Weight: 227 lb 4.7 oz (103.1 kg) IBW/kg (Calculated) : 53.55 Heparin Dosing Weight: 78.1  Vital Signs: Temp: 102.3 F (39.1 C) (03/07 1912) Temp Source: Oral (03/07 1912) BP: 113/74 (03/07 1912) Pulse Rate: 101 (03/07 1912)  Labs: Recent Labs    12/16/18 1150 12/17/18 2014 12/17/18 2215 12/18/18 0103 12/18/18 0858 12/18/18 1136 12/18/18 1431 12/18/18 2034  HGB 10.4* 10.5*  --  12.2  --   --   --   --   HCT 34.3 35.9*  --  36.0  --   --   --   --   PLT 239 264  --   --   --   --   --   --   HEPARINUNFRC  --   --   --   --  0.15* 0.11*  --  0.31  CREATININE 1.12* 1.33*  --   --   --   --  1.26*  --   TROPONINI  --   --  0.14*  --  0.21*  --  0.21*  --     Estimated Creatinine Clearance: 75 mL/min (A) (by C-G formula based on SCr of 1.26 mg/dL (H)).   Medical History: Past Medical History:  Diagnosis Date  . Condyloma acuminata   . HTN in pregnancy, chronic 09/24/2015   [place] labs: [x ]Cr, [x]  24hr protein (208) P:Cr 92 Plat: 207 AST/ALT: 10/10    . Hypertension   . Obesity in pregnancy    Antepartum, first trimester  . URI (upper respiratory infection)     Medications:  See medication history  Assessment: 33 yo lady to start heparin for PE.  No evidence of right heart strain.  She was not on anticoagulation PTA.  Baseline Hg 10.5, PTLC 264.  Heparin level increased to 0.31, on 1650 units/hr. No s/sx of bleeding. No infusion issues per nursing.   Goal of Therapy:  Heparin level 0.3-0.7 units/ml Monitor platelets by anticoagulation protocol: Yes   Plan:  Will increase to 1700 units/hr to keep in goal range Monitor daily HL, CBC, and for s/sx of bleeding F/u plan for oral anticoag at time of discharge  Antonietta Jewel, PharmD, [facility] Pharmacist  Pager: [phone] Phone: [phone] Please  check AMION for all [facility] numbers 12/18/2018 9:31 PM

## 2018-12-18 NOTE — Progress Notes (Signed)
NAME:  [name] [name], MRN:  [id number], DOB:  [birth year], LOS: 1 ADMISSION DATE:  12/17/2018, CONSULTATION DATE:  3/6 REFERRING MD:  [place] , CHIEF COMPLAINT:  SOB    Brief History   33 yr old F w/ PMHx Pregnancy induced HTN, condyloma acuminata, morbid obesoty presents with SOB found to have right middle and right lower lobe segmental pulmonary emboli with airspace opacities in the same lobes with right pleural effusion. PCCM consulted for assistance in mgmt  History of present illness   Pt has a PMHx of HTN,Condyloma acuminata, iron deficiency anemia,  HFpEF  Who presented to [facility] with complaints of shortness of breath, pain on deep inspiration and blood streaked sputum.  Pt states that she has been weak with increased fatigue for the last few weeks and has noticed prior to her SOB that her abdomen is larger than normal and its difficult for her to button her pants.  She also states that on Tuesday 3rd March she noticed while sitting down that her legs began to swell. This continues through the week and did not improve with elevation. She states that she was seen by Cardiology outpatient on 12/16/2018 - she was out of lisinopril for 2 weeks prior to this visit and due to her lower ext edema and abdominal distension she was switched from chlorthalidone to lasix.  She states that she can not quantify the hemoptysis she just noticed when she coughed the mucus was blood tinged and sometimes more blood than mucus (tsp fulls at a time).  The increased work of breathing and chest pain prompted her to seek medical care on 12/17/2018.   Past Medical History    Significant Hospital Events     Consults:  pccm 3/6   Procedures:    Significant Diagnostic Tests:  Echo 3/7 >>  Micro Data:    Antimicrobials:  Azithro 3/6->> Maxipime 3/7>> Vanc 3/7 >>  Interim history/subjective:  Remains in ER awaiting bed placement  On Hep drip and ABX .  Says she is feeling some better, +pleuritic pain .    Dyspnea is less.  Echo being done   Objective   Blood pressure 122/83, pulse 99, temperature 99.8 F (37.7 C), temperature source Oral, resp. rate (!) 35, height 5' 3.5" (1.613 m), SpO2 100 %.    FiO2 (%):  [48 %-81 %] 48 %   Intake/Output Summary (Last 24 hours) at 12/18/2018 1118 Last data filed at 12/18/2018 1036 Gross per 24 hour  Intake 350 ml  Output 1500 ml  Net -1150 ml   There were no vitals filed for this visit.  Examination: General: Young female sitting up in bed , NAD  HENT: AT , [place] , MM moist  Lungs: few trace rhonchi  Cardiovascular: ST , no m/r/g  Abdomen: Soft NT , BS +  Extremities: intact, tr edema  Neuro: a/o x 3 , following commands.  GU: intact   Resolved Hospital Problem list     Assessment & Plan:  Acute Respiratory Insufficiency from Multifactorial Etiology: A.  CHF decompensated- Pulmonary edema with effusion and pericardial effusion. B/l lower ext swelling, pillow orthopnea, increased abdominal girth, positive trop and BNP 3613 (normally underestimated In obese patients) GFR >60. Last ECHO with diastolic dysfunction    Recommend: Continue on Cardiac monitoring.  Diuresis with Lasix IV as tolerated.  Consult Cardiology.  Started her on HFNC to improve oxygen supply and add some PEEP to decrease strain/demand.  Trend Troponin and EKG.  Echo pending  Check CXR in am   B. Right Middle and Lower Lobe Pneumonia: Plan  Cont CAP coverage -would de-escalate as indicated.  Check Procacitonin, Trend WBC and fever curve.    C. Segmental PE w/o right heart strain in patient on nexplanon impl birth control- no need for IR evaluation not a candidate for EKOS   Recommend:  anticoagulation with heparin gtt and a repeat ECHO   D. Hemoptysis: low volume described as blood tinged. May be explained by pulmonary edema (pnk frothy sputum). Recommend: Close observation  Hypomagnesium   Plan  Replace Mg 3/7  Check mg in am   Best practice:  Diet:   Pain/Anxiety/Delirium protocol (if indicated): na VAP protocol (if indicated): na DVT prophylaxis: Hep drip  GI prophylaxis: na  Glucose control: monitor BS  Mobility: BR  Code Status: full  Family Communication: updated pt  Disposition: Admitted by Hospitalist, PCCM consulting   Labs   CBC: Recent Labs  Lab 12/16/18 1150 12/17/18 2014 12/18/18 0103  WBC 7.1 13.7*  --   HGB 10.4* 10.5* 12.2  HCT 34.3 35.9* 36.0  MCV 76* 75.9*  --   PLT 239 264  --     Basic Metabolic Panel: Recent Labs  Lab 12/16/18 1150 12/17/18 2014 12/18/18 0103 12/18/18 0858  NA 138 136 136  --   K 3.8 3.2* 5.4*  --   CL 104 106  --   --   CO2 20 23  --   --   GLUCOSE 79 120*  --   --   BUN 16 11  --   --   CREATININE 1.12* 1.33*  --   --   CALCIUM 8.4* 8.2*  --   --   MG  --   --   --  1.5*  PHOS  --   --   --  3.5   GFR: Estimated Creatinine Clearance: 72.7 mL/min (A) (by C-G formula based on SCr of 1.33 mg/dL (H)). Recent Labs  Lab 12/16/18 1150 12/17/18 2014  WBC 7.1 13.7*    Liver Function Tests: No results for input(s): AST, ALT, ALKPHOS, BILITOT, PROT, ALBUMIN in the last 168 hours. No results for input(s): LIPASE, AMYLASE in the last 168 hours. No results for input(s): AMMONIA in the last 168 hours.  ABG    Component Value Date/Time   HCO3 23.8 12/18/2018 0103   TCO2 25 12/18/2018 0103   O2SAT 94.0 12/18/2018 0103     Coagulation Profile: No results for input(s): INR, PROTIME in the last 168 hours.  Cardiac Enzymes: Recent Labs  Lab 12/17/18 2215 12/18/18 0858  TROPONINI 0.14* 0.21*    HbA1C: No results found for: HGBA1C  CBG: No results for input(s): GLUCAP in the last 168 hours.  Review of Systems:     Past Medical History  She,  has a past medical history of Condyloma acuminata, HTN in pregnancy, chronic (09/24/2015), Hypertension, Obesity in pregnancy, and URI (upper respiratory infection).   Surgical History    Past Surgical History:   Procedure Laterality Date  . BIRTH CONTROL IMPLANT Left 03/2016   "Nexplanon in my upper arm"  . CESAREAN SECTION N/A 02/25/2016   Procedure: CESAREAN SECTION;  Surgeon: Truett Mainland, DO;  Location: [facility];  Service: Obstetrics;  Laterality: N/A;     Social History   reports that she has never smoked. She has never used smokeless tobacco. She reports that she does not drink alcohol or use drugs.   Family History  Her family history includes Heart disease in her mother; Hypertension in her mother.   Allergies No Known Allergies   Home Medications  Prior to Admission medications   Medication Sig Start Date End Date Taking? Authorizing Provider  amLODipine (NORVASC) 10 MG tablet Take 1 tablet (10 mg total) by mouth daily. 12/16/18  Yes Daune Perch, NP  carvedilol (COREG) 25 MG tablet Take 1 tablet (25 mg total) by mouth 2 (two) times daily. 12/16/18  Yes Daune Perch, NP  colchicine 0.6 MG tablet Take 0.6 mg by mouth daily.   Yes [provider]  ferrous sulfate 325 (65 FE) MG tablet Take 1 tablet (325 mg total) by mouth daily with breakfast. 08/31/18  Yes Seawell, Jaimie A, DO  furosemide (LASIX) 40 MG tablet Take 1 tablet (40 mg total) by mouth daily. 12/16/18  Yes Daune Perch, NP  lisinopril (PRINIVIL,ZESTRIL) 40 MG tablet Take 1 tablet (40 mg total) by mouth daily. 12/16/18  Yes Daune Perch, NP  spironolactone (ALDACTONE) 25 MG tablet Take 25 mg by mouth daily.   Yes [provider]  etonogestrel (NEXPLANON) 68 MG IMPL implant 1 each by Subdermal route once.    [provider]     Critical care time:

## 2018-12-18 NOTE — Consult Note (Signed)
..   NAME:  [name] [name], MRN:  [id number], DOB:  August 12, 1986, LOS: 1 ADMISSION DATE:  12/17/2018, CONSULTATION DATE:  12/18/2018 REFERRING MD:  Marvis Repress MD, CHIEF COMPLAINT:  SOB   Brief History   33 yr old F w/ PMHx Pregnancy induced HTN, condyloma acuminata, morbid obesoty presents with SOB found to have right middle and right lower lobe segmental pulmonary emboli with airspace opacities in the same lobes with right pleural effusion. PCCM consulted for assistance in mgmt  History of present illness   Pt has a PMHx of HTN,Condyloma acuminata, iron deficiency anemia,  HFpEF  Who presented to [facility] with complaints of shortness of breath, pain on deep inspiration and blood streaked sputum.  Pt states that she has been weak with increased fatigue for the last few weeks and has noticed prior to her SOB that her abdomen is larger than normal and its difficult for her to button her pants.  She also states that on Tuesday 3rd March she noticed while sitting down that her legs began to swell. This continues through the week and did not improve with elevation. She states that she was seen by Cardiology outpatient on 12/16/2018 - she was out of lisinopril for 2 weeks prior to this visit and due to her lower ext edema and abdominal distension she was switched from chlorthalidone to lasix.  She states that she can not quantify the hemoptysis she just noticed when she coughed the mucus was blood tinged and sometimes more blood than mucus (tsp fulls at a time).  The increased work of breathing and chest pain prompted her to seek medical care on 12/17/2018.   Past Medical History  .Marland Kitchen Active Ambulatory Problems    Diagnosis Date Noted  . Hypertensive urgency 04/03/2016  . Hypertension 10/29/2016  . Abdominal pain 08/29/2018   Resolved Ambulatory Problems    Diagnosis Date Noted  . Supervision of high-risk pregnancy 09/24/2015  . HTN in pregnancy, chronic 09/24/2015  . Obesity affecting pregnancy, antepartum  09/24/2015  . Abnormal quad screen 10/18/2015  . Hyperemesis gravidarum 10/24/2015  . Group B Streptococcus carrier, +RV culture, currently pregnant 02/03/2016   Past Medical History:  Diagnosis Date  . Condyloma acuminata   . Obesity in pregnancy   . URI (upper respiratory infection)    Consults:  PCCM 12/18/2018  Procedures:  none  Significant Diagnostic Tests:  Sig Labs: K+ 3.2 GFR >60 Creatinine 1.33 Trop 0.09->0.14 WBC 13.7 Hgb 10.5 Plts 264 BNP 3613   EKG:  Vent. rate 111 BPM PR interval 168 ms QRS duration 68 ms QT/QTc 332/451 ms P-R-T axes 65 26 16 Biatrial enlargement with non specific ST changes   CT ANGIO CHEST PE 12/17/2018 FINDINGS: CARDIOVASCULAR: Adequate contrast opacification of the pulmonary artery's. Main pulmonary artery is not enlarged. Segmental occlusive pulmonary arterial filling defects and RIGHT middle lobe, and RIGHT lower lobe. Moderate cardiomegaly. No RIGHT heart strain (RV/LV equals 0.8) dilated RIGHT atrium with reflux contrast into the intrahepatic IVC. Moderate pericardial effusion. Thoracic aorta is normal course and caliber. LUNGS/PLEURA: Tracheobronchial tree is patent, no pneumothorax. Confluent ground-glass opacities RIGHT middle and RIGHT lower lobe. LEFT lower lobe atelectasis. Small RIGHT pleural effusion.  Micro Data:  none  Antimicrobials:  CAP coverage  Ceftriaxone - 12/17/2018 Azithromycin- 12/17/2018   Interim history/subjective:  Pt states that she is having difficulty breathing due to the pain in her chest.  She says she feels better when she is sitting upright or propped with pillows.  Objective  Blood pressure 118/68, pulse (!) 108, temperature 99.8 F (37.7 C), temperature source Oral, resp. rate (!) 35, height 5' 3.5" (1.613 m), SpO2 100 %.       No intake or output data in the 24 hours ending 12/18/18 0002 There were no vitals filed for this visit.  Examination: General: obese female verbal alert in mild  distress HENT: normocephalic atraumatic no nasal cannula Lungs: shallow diminished breath sounds unable to get patient to take a deep breath Cardiovascular: S1 and S2 noted regular rhythm increased rate displaced PMI  Abdomen: soft obese abdomen + BS non tender Extremities: +3 pitting edema to above the knees Neuro: AAO x 3 with no focal deficits   Assessment & Plan:  Acute Respiratory Insufficiency from Multifactorial Etiology: A.  CHF decompensated- Pulmonary edema with effusion and pericardial effusion. B/l lower ext swelling, pillow orthopnea, increased abdominal girth, positive trop and BNP 3613 (normally underestimated In obese patients) GFR >60. Last ECHO with diastolic dysfunction  Recommend: Continue on Cardiac monitoring. Diuresis with Lasix IV. Low salt diet. Replace potassium. Consult Cardiology. Started her on HFNC to improve oxygen supply and add some PEEP to decrease strain/demand. Trend Troponin and EKG. Get an ECHO.  B. Right Middle and Lower Lobe Pneumonia: I agree with CAP coverage. Check Procacitonin, Trend WBC and fever curve. No sick contacts and afebrile.   C. Segmental PE w/o right heart strain in patient on nexplanon impl birth control- no need for IR evaluation not a candidate for EKOS Recommend: anticoagulation with heparin gtt and a repeat ECHO   D. Hemoptysis: low volume described as blood tinged. May be explained by pulmonary edema (pnk frothy sputum). Recommend: Close observation  Best practice:  Diet: per Primary Pain/Anxiety/Delirium protocol (if indicated): not indicated VAP protocol (if indicated): pt not intubated not indicated  DVT prophylaxis: heparin gtt for PE GI prophylaxis: not indicated pt not on steroids, not intubated.  Glucose control:  no h/o DM-> if BG exceeds 180mg /dl consider starting ISS Mobility: bedrest Code Status: Full Family Communication: husband at bedside Disposition: Admitted to Hospitalist. Pulmonary following as  consult  Labs   CBC: Recent Labs  Lab 12/16/18 1150 12/17/18 2014  WBC 7.1 13.7*  HGB 10.4* 10.5*  HCT 34.3 35.9*  MCV 76* 75.9*  PLT 239 696    Basic Metabolic Panel: Recent Labs  Lab 12/16/18 1150 12/17/18 2014  NA 138 136  K 3.8 3.2*  CL 104 106  CO2 20 23  GLUCOSE 79 120*  BUN 16 11  CREATININE 1.12* 1.33*  CALCIUM 8.4* 8.2*   GFR: Estimated Creatinine Clearance: 72.7 mL/min (A) (by C-G formula based on SCr of 1.33 mg/dL (H)). Recent Labs  Lab 12/16/18 1150 12/17/18 2014  WBC 7.1 13.7*    Liver Function Tests: No results for input(s): AST, ALT, ALKPHOS, BILITOT, PROT, ALBUMIN in the last 168 hours. No results for input(s): LIPASE, AMYLASE in the last 168 hours. No results for input(s): AMMONIA in the last 168 hours.  ABG No results found for: PHART, PCO2ART, PO2ART, HCO3, TCO2, ACIDBASEDEF, O2SAT   Coagulation Profile: No results for input(s): INR, PROTIME in the last 168 hours.  Cardiac Enzymes: Recent Labs  Lab 12/17/18 2215  TROPONINI 0.14*    HbA1C: No results found for: HGBA1C  CBG: No results for input(s): GLUCAP in the last 168 hours.  Review of Systems:   Marland KitchenMarland KitchenReview of Systems  Constitutional: Positive for malaise/fatigue. Negative for fever.  HENT: Negative for congestion and nosebleeds.  Eyes: Negative.   Respiratory: Positive for cough, hemoptysis, sputum production and shortness of breath.   Cardiovascular: Positive for chest pain, palpitations, orthopnea, leg swelling and PND.  Gastrointestinal: Negative.   Genitourinary: Negative.   Neurological: Negative.      Past Medical History  She,  has a past medical history of Condyloma acuminata, HTN in pregnancy, chronic (09/24/2015), Hypertension, Obesity in pregnancy, and URI (upper respiratory infection).   Surgical History    Past Surgical History:  Procedure Laterality Date  . BIRTH CONTROL IMPLANT Left 03/2016   "Nexplanon in my upper arm"  . CESAREAN SECTION N/A  02/25/2016   Procedure: CESAREAN SECTION;  Surgeon: Truett Mainland, DO;  Location: [place];  Service: Obstetrics;  Laterality: N/A;     Social History   reports that she has never smoked. She has never used smokeless tobacco. She reports that she does not drink alcohol or use drugs.   Family History   Her family history includes Heart disease in her mother; Hypertension in her mother.   Allergies No Known Allergies   Home Medications  Prior to Admission medications   Medication Sig Start Date End Date Taking? Authorizing Provider  amLODipine (NORVASC) 10 MG tablet Take 1 tablet (10 mg total) by mouth daily. 12/16/18  Yes Daune Perch, NP  carvedilol (COREG) 25 MG tablet Take 1 tablet (25 mg total) by mouth 2 (two) times daily. 12/16/18  Yes Daune Perch, NP  colchicine 0.6 MG tablet Take 0.6 mg by mouth daily.   Yes [provider]  ferrous sulfate 325 (65 FE) MG tablet Take 1 tablet (325 mg total) by mouth daily with breakfast. 08/31/18  Yes Seawell, Jaimie A, DO  furosemide (LASIX) 40 MG tablet Take 1 tablet (40 mg total) by mouth daily. 12/16/18  Yes Daune Perch, NP  lisinopril (PRINIVIL,ZESTRIL) 40 MG tablet Take 1 tablet (40 mg total) by mouth daily. 12/16/18  Yes Daune Perch, NP  spironolactone (ALDACTONE) 25 MG tablet Take 25 mg by mouth daily.   Yes [provider]  etonogestrel (NEXPLANON) 68 MG IMPL implant 1 each by Subdermal route once.    [provider]      I, Dr Seward Carol have personally reviewed patient's available data, including medical history, events of note, physical examination and test results as part of my evaluation. I have discussed with other care providers such as RN and Elink.  In addition,  I personally evaluated patient This patient was evaluated by PCCM. She is hemodynamically stable. She will be admitted to Hospitalist service. She will be followed by Pulmonary consult  Critical Care Time devoted to  patient care services described in this note is  45 Minutes. This time reflects time of care of this signee Dr Seward Carol. This critical care time does not reflect procedure time, or teaching time or supervisory time but could involve care discussion time    Dr. Seward Carol Pulmonary Critical Care Medicine  12/18/2018 1:05 AM   Critical care time: 45 mins

## 2018-12-18 NOTE — Progress Notes (Signed)
Bilateral lower extremity venous duplex completed. Refer to "CV Proc" under chart review to view preliminary results.  12/18/2018 1:49 PM Maudry Mayhew, MHA, RVT, RDCS, RDMS

## 2018-12-18 NOTE — Progress Notes (Signed)
PROGRESS NOTE   [name] [name]  FBP:[id number]    DOB: 1985/12/22    DOA: 12/17/2018  PCP: Fanny Bien, MD   I have briefly reviewed patients previous medical records in [facility].  Brief Narrative:  33 year old female with PMH of pregnancy-induced hypertension, morbid obesity, diastolic dysfunction, hospitalized end of December 2019 in [facility] for possible pneumonia and "fluid around my heart", seen by her OP cardiologist on 3/5/202 for lower extremity edema, abdominal distention, cough productive of blood-tinged sputum in the context of running out of her lisinopril and amlodipine for 2 weeks, assessed to have unspecified heart failure, chlorthalidone was switched to Lasix 40 mg daily, carvedilol dose was increased, continued lisinopril, amlodipine and Aldactone, supposed to recheck echo and close follow-up on 3/13, presented to [facility] ED the next day 3/6 due to right-sided pleuritic chest pain, ongoing cough with mild hemoptysis and dyspnea.  Admitted for acute pulmonary embolism without right heart strain, right middle and lower lobe pulmonary infarction versus HCAP (less likely), acute on chronic CHF (suspect systolic) and acute hypoxic respiratory failure.  PCCM and cardiology consulted.   Assessment & Plan:   Active Problems:   HCAP (healthcare-associated pneumonia)   Hemoptysis   Pulmonary emboli (HCC)   Chronic diastolic CHF (congestive heart failure) (HCC)   Elevated troponin   1. Acute segmental pulmonary embolism of RML and RLL: Noted on CTA chest.  No right heart strain.  Risk factors: Morbid obesity, [place] hormonal contraception.  Mild hemoptysis likely related to acute PE with possible pulmonary infarction.  Continue IV heparin drip until stabilizes and then transition to oral agent.  Outpatient discontinuation of hormonal contraception.  Duration of anticoagulation: At least 3 months. 2. RML and RLL pulmonary infarction versus HCAP: CTA chest indicates that the  airspace opacities are most consistent with pneumonia and atypical for infarcts.  However patient does not have any other clinical features suggestive of pneumonia (no productive cough, fever, sick contacts).  Suspect this is likely pulmonary infarction and less likely to be pneumonia.  However for now continue IV heparin and broad-spectrum IV antibiotics (cefepime, vancomycin and azithromycin).  Hospitalized <90 days in [facility] for pneumonia and possible CHF. 3. Acute on chronic CHF, suspect systolic versus combined): TTE in 2016: LVEF 60-65%.  Repeat TTE.  Received a dose of IV Lasix 40 mg last night, -1.15 L thus far.  Significantly volume overloaded.  Continue IV Lasix 40 mg daily.  Hold lisinopril and Aldactone due to hyperkalemia and mild renal insufficiency.  Cardiology consulted for assistance in management.  BNP 3613 > 837.  Counseled regarding low-salt diet, had salt indiscretion and was supposed to see a dietitian on 3/9. 4. Acute respiratory failure with hypoxia: Secondary to acute PE, pulmonary infarction versus pneumonia, decompensated CHF.  Treat underlying causes as above.  Oxygen supplementation.  Incentive spirometry.  Follow chest x-ray in a.m. 5. Pleurisy/hemoptysis: Hemoptysis is minimal.  Treatment as above and monitor closely.  Adequate pain management. 6. Elevated troponin: Suspect demand ischemia due to above reasons.  Troponin: 0.09 > 0.14 > 0.21.  Follow TTE.  Holding beta-blockers due to decompensated CHF but may have to start at low-dose.  Trend troponin.  Cardiology consulted. 7. Hypokalemia: Presented with potassium of 3.2, received a dose of potassium 40 mEq overnight and potassium up to 5.4,?  Spurious.  Follow CMP later this afternoon.  Holding lisinopril and Aldactone. 8. Hypomagnesemia: Magnesium 1.5.  Replace and follow. 9. Acute kidney injury: Creatinine on 3/5: 1.12,  creatinine on admission up to 1.33.  This could be cardiogenic versus medication related.  Did  receive contrast after that.  Follow BMP closely with diuresis. 10. Iron deficiency anemia: Hemoglobin baseline in the 10 g range.  Anemia panel 08/29/2018: Iron 18, TIBC 447, saturation 4, ferritin 10.  Unclear etiology.  Need to check with her regarding heavy menstrual blood loss.  Once better, start iron supplements.  Follow CBCs.  Pregnancy test negative. 11. Essential hypertension: Uncontrolled.  May have to start back carvedilol at low-dose.  Holding lisinopril.  PRN IV hydralazine. 12. Morbid obesity/Body mass index is 41.29 kg/m.:  Needs to diet, exercise and weight loss when able.    DVT prophylaxis: Currently on IV heparin drip Code Status: Full Family Communication: None at bedside Disposition: DC home pending clinical improvement.   Consultants:  PCCM Cardiology-pending  Procedures:  None  Antimicrobials:  IV cefepime, vancomycin and azithromycin   Subjective: Patient seen early this morning in ED.  Feels slightly better.  Right-sided pleuritic chest pain, worse with deep inspiration and coughing, 5/10 in severity, IV morphine not controlling pain consistently, minimal bright red blood in sputum (noted to very tiny globs of blood in cup), dyspnea improved compared to last night, urinated well after dose of Lasix.  Denies recent long distance travel, sick contacts.  States that her contraceptive implant is supposed to come out in July 2020.  ROS: As above, otherwise negative  Objective:  Vitals:   12/18/18 0900 12/18/18 0930 12/18/18 1000 12/18/18 1030  BP: 132/89 (!) 135/96 (!) 134/104 122/83  Pulse: (!) 104 (!) 102 (!) 101 99  Resp: (!) 35 (!) 39 (!) 38 (!) 35  Temp:      TempSrc:      SpO2: 100% 100% 100% 100%  Height:        Examination:  General exam: Pleasant young female, moderately built and morbidly obese, sitting up in bed, mildly tachypneic. Respiratory system: Reduced breath sounds in the bases, right >left, right base bronchial breath sounds, few  bibasal crackles, no pleural rub.  Rest of lung fields clear to auscultation.  Shallow respirations with mild tachypnea due to pain. Cardiovascular system: S1 & S2 heard, RRR. No murmurs, rubs, gallops or clicks.  JVD +.  Bilateral 2+ pitting leg edema. Maitri personally reviewed: SR- occasional mild ST in the 100s. Gastrointestinal system: Abdomen is nondistended, soft and nontender. No organomegaly or masses felt. Normal bowel sounds heard. Central nervous system: Alert and oriented. No focal neurological deficits. Extremities: Symmetric 5 x 5 power. Skin: No rashes, lesions or ulcers Psychiatry: Judgement and insight appear normal. Mood & affect appropriate.     Data Reviewed: I have personally reviewed following labs and imaging studies  CBC: Recent Labs  Lab 12/16/18 1150 12/17/18 2014 12/18/18 0103  WBC 7.1 13.7*  --   HGB 10.4* 10.5* 12.2  HCT 34.3 35.9* 36.0  MCV 76* 75.9*  --   PLT 239 264  --    Basic Metabolic Panel: Recent Labs  Lab 12/16/18 1150 12/17/18 2014 12/18/18 0103 12/18/18 0858  NA 138 136 136  --   K 3.8 3.2* 5.4*  --   CL 104 106  --   --   CO2 20 23  --   --   GLUCOSE 79 120*  --   --   BUN 16 11  --   --   CREATININE 1.12* 1.33*  --   --   CALCIUM 8.4* 8.2*  --   --  MG  --   --   --  1.5*  PHOS  --   --   --  3.5   Liver Function Tests: No results for input(s): AST, ALT, ALKPHOS, BILITOT, PROT, ALBUMIN in the last 168 hours. Coagulation Profile: No results for input(s): INR, PROTIME in the last 168 hours. Cardiac Enzymes: Recent Labs  Lab 12/17/18 2215 12/18/18 0858  TROPONINI 0.14* 0.21*   HbA1C: No results for input(s): HGBA1C in the last 72 hours. CBG: No results for input(s): GLUCAP in the last 168 hours.  No results found for this or any previous visit (from the past 240 hour(s)).       Radiology Studies: Dg Chest 2 View  Result Date: 12/17/2018 CLINICAL DATA:  Increased swelling. Hemoptysis. Difficulty breathing.  Chest pain. EXAM: CHEST - 2 VIEW COMPARISON:  CT of the abdomen and pelvis 08/29/2018 FINDINGS: Heart is enlarged, stable. Mild pulmonary vascular congestion is present. Right middle lobe pneumonia is present. A right pleural effusion is evident. Minimal atelectasis is present at the left base. IMPRESSION: 1. Right middle lobe pneumonia. 2. Right pleural effusion. 3. Cardiomegaly without failure. Electronically Signed   By: San Morelle M.D.   On: 12/17/2018 20:37   Ct Angio Chest Pe W And/or Wo Contrast  Result Date: 12/17/2018 CLINICAL DATA:  Hemoptysis, shortness of breath for 2 days. EXAM: CT ANGIOGRAPHY CHEST WITH CONTRAST TECHNIQUE: Multidetector CT imaging of the chest was performed using the standard protocol during bolus administration of intravenous contrast. Multiplanar CT image reconstructions and MIPs were obtained to evaluate the vascular anatomy. CONTRAST:  38mL OMNIPAQUE IOHEXOL 350 MG/ML SOLN COMPARISON:  Chest radiograph December 17, 2018 FINDINGS: CARDIOVASCULAR: Adequate contrast opacification of the pulmonary artery's. Main pulmonary artery is not enlarged. Segmental occlusive pulmonary arterial filling defects and RIGHT middle lobe, and RIGHT lower lobe. Moderate cardiomegaly. No RIGHT heart strain (RV/LV equals 0.8) dilated RIGHT atrium with reflux contrast into the intrahepatic IVC. Moderate pericardial effusion. Thoracic aorta is normal course and caliber. MEDIASTINUM/NODES: No lymphadenopathy by CT size criteria. LUNGS/PLEURA: Tracheobronchial tree is patent, no pneumothorax. Confluent ground-glass opacities RIGHT middle and RIGHT lower lobe. LEFT lower lobe atelectasis. Small RIGHT pleural effusion. UPPER ABDOMEN: Non-acute. Contrast versus calcifications LEFT kidney. MUSCULOSKELETAL: Non-acute. Mild broad levoscoliosis on this nonweightbearing examination. Review of the MIP images confirms the above findings. IMPRESSION: 1. Acute segmental RIGHT middle and RIGHT lower lobe  occlusive pulmonary emboli. No RIGHT heart strain. 2. Confluent RIGHT middle RIGHT lower lobe airspace opacities most consistent with pneumonia, atypical appearance for infarcts. Small RIGHT pleural effusion. 3. Critical Value/emergent results were called by telephone at the time of interpretation on 12/17/2018 at 11:05 pm to Dr. Lajean Saver , who verbally acknowledged these results. Electronically Signed   By: Elon Alas M.D.   On: 12/17/2018 23:06        Scheduled Meds: . furosemide  40 mg Intravenous Daily  . sodium chloride flush  3 mL Intravenous Once  . sodium chloride flush  3 mL Intravenous Q12H   Continuous Infusions: . sodium chloride    . azithromycin Stopped (12/18/18 0106)  . ceFEPime (MAXIPIME) IV 1 g (12/18/18 0931)  . heparin 1,400 Units/hr (12/17/18 2351)  . vancomycin    . vancomycin       LOS: 1 day     Vernell Leep, MD, FACP, [facility]. [facility]  To contact the attending provider between 7A-7P or the covering provider during after hours 7P-7A, please log into the web site [email or web address]  and access using universal [place] password for that web site. If you do not have the password, please call the hospital operator.  12/18/2018, 11:20 AM

## 2018-12-18 NOTE — ED Notes (Signed)
Reject x1 QNS for blood test.

## 2018-12-18 NOTE — Plan of Care (Signed)

## 2018-12-18 NOTE — ED Notes (Signed)
ED TO INPATIENT HANDOFF REPORT  ED Nurse Name and Phone #:  Gerald Stabs [phone]  S Name/Age/Gender [name] [name] 33 y.o. female Room/Bed: 025C/025C  Code Status   Code Status: Full Code  Home/SNF/Other Home Patient oriented to: self, place, time and situation Is this baseline? Yes   Triage Complete: Triage complete  Chief Complaint vomiting/swollen stomach,feet, legs  Triage Note Patient reports noticing swelling from abdomen down lower extremeties for two days and coughing up blood since Wednesday night.  Also having some labored breathing in triage.  Hx of pneumonia this past December. Denies having any fever.  Later in triage also c/o chest pain and I visualized patient cough up blood tinged sputum.   Allergies No Known Allergies  Level of Care/Admitting Diagnosis ED Disposition    ED Disposition Condition Comment   Admit  Hospital Area: [facility] [[id number]]  Level of Care: Progressive [102]  Diagnosis: HCAP (healthcare-associated pneumonia) [[id number]]  Admitting Physician: Toy Baker [[id number]]  Attending Physician: Toy Baker [[id number]]  Estimated length of stay: 3 - 4 days  Certification:: I certify this patient will need inpatient services for at least 2 midnights  PT Class (Do Not Modify): Inpatient [101]  PT Acc Code (Do Not Modify): Private [1]       B Medical/Surgery History Past Medical History:  Diagnosis Date  . Condyloma acuminata   . HTN in pregnancy, chronic 09/24/2015   [place] labs: [x ]Cr, [x]  24hr protein (208) P:Cr 92 Plat: 207 AST/ALT: 10/10    . Hypertension   . Obesity in pregnancy    Antepartum, first trimester  . URI (upper respiratory infection)    Past Surgical History:  Procedure Laterality Date  . BIRTH CONTROL IMPLANT Left 03/2016   "Nexplanon in my upper arm"  . CESAREAN SECTION N/A 02/25/2016   Procedure: CESAREAN SECTION;  Surgeon: Truett Mainland, DO;  Location: [facility];  Service: Obstetrics;   Laterality: N/A;     A IV Location/Drains/Wounds Patient Lines/Drains/Airways Status   Active Line/Drains/Airways    Name:   Placement date:   Placement time:   Site:   Days:   Peripheral IV 12/17/18 Right Antecubital   12/17/18    2135    Antecubital   1   External Urinary Catheter   12/18/18    0109    -   less than 1   Incision (Closed) 02/25/16 Abdomen   02/25/16    2330     1027          Intake/Output Last 24 hours  Intake/Output Summary (Last 24 hours) at 12/18/2018 1205 Last data filed at 12/18/2018 1036 Gross per 24 hour  Intake 450 ml  Output 1500 ml  Net -1050 ml    Labs/Imaging Results for orders placed or performed during the hospital encounter of 12/17/18 (from the past 48 hour(s))  Basic metabolic panel     Status: Abnormal   Collection Time: 12/17/18  8:14 PM  Result Value Ref Range   Sodium 136 135 - 145 mmol/L   Potassium 3.2 (L) 3.5 - 5.1 mmol/L   Chloride 106 98 - 111 mmol/L   CO2 23 22 - 32 mmol/L   Glucose, Bld 120 (H) 70 - 99 mg/dL   BUN 11 6 - 20 mg/dL   Creatinine, Ser 1.33 (H) 0.44 - 1.00 mg/dL   Calcium 8.2 (L) 8.9 - 10.3 mg/dL   GFR calc non Af Amer 53 (L) >60 mL/min   GFR calc Af  Amer >60 >60 mL/min   Anion gap 7 5 - 15    Comment: Performed at [facility] [address] [place], [place] [address]  CBC     Status: Abnormal   Collection Time: 12/17/18  8:14 PM  Result Value Ref Range   WBC 13.7 (H) 4.0 - 10.5 K/uL   RBC 4.73 3.87 - 5.11 MIL/uL   Hemoglobin 10.5 (L) 12.0 - 15.0 g/dL   HCT 35.9 (L) 36.0 - 46.0 %   MCV 75.9 (L) 80.0 - 100.0 fL   MCH 22.2 (L) 26.0 - 34.0 pg   MCHC 29.2 (L) 30.0 - 36.0 g/dL   RDW 16.3 (H) 11.5 - 15.5 %   Platelets 264 150 - 400 K/uL   nRBC 0.0 0.0 - 0.2 %    Comment: Performed at [facility], [place] [address] [place], [place] [address]  Brain natriuretic peptide     Status: Abnormal   Collection Time: 12/17/18  8:14 PM  Result Value Ref Range   B Natriuretic Peptide 836.8 (H) 0.0 - 100.0  pg/mL    Comment: Performed at [facility] [address], [place], [place] [address]  I-stat troponin, ED     Status: Abnormal   Collection Time: 12/17/18  8:20 PM  Result Value Ref Range   Troponin i, poc 0.09 (HH) 0.00 - 0.08 ng/mL   Comment NOTIFIED PHYSICIAN    Comment 3            Comment: Due to the release kinetics of cTnI, a negative result within the first hours of the onset of symptoms does not rule out myocardial infarction with certainty. If myocardial infarction is still suspected, repeat the test at appropriate intervals.   I-Stat beta hCG blood, ED     Status: None   Collection Time: 12/17/18  8:21 PM  Result Value Ref Range   I-stat hCG, quantitative <5.0 <5 mIU/mL   Comment 3            Comment:   GEST. AGE      CONC.  (mIU/mL)   <=1 WEEK        5 - 50     2 WEEKS       50 - 500     3 WEEKS       100 - 10,000     4 WEEKS     1,000 - 30,000        FEMALE AND NON-PREGNANT FEMALE:     LESS THAN 5 mIU/mL   Troponin I - Once-Timed     Status: Abnormal   Collection Time: 12/17/18 10:15 PM  Result Value Ref Range   Troponin I 0.14 (HH) <0.03 ng/mL    Comment: CRITICAL RESULT CALLED TO, READ BACK BY AND VERIFIED WITH: BAILEY,S RN 12/17/2018 2305 JORDANS Performed at [facility], [place] [address], [place], [place] [address]   POCT I-Stat EG7     Status: Abnormal   Collection Time: 12/18/18  1:03 AM  Result Value Ref Range   pH, Ven 7.480 (H) 7.250 - 7.430   pCO2, Ven 31.9 (L) 44.0 - 60.0 mmHg   pO2, Ven 63.0 (H) 32.0 - 45.0 mmHg   Bicarbonate 23.8 20.0 - 28.0 mmol/L   TCO2 25 22 - 32 mmol/L   O2 Saturation 94.0 %   Acid-Base Excess 1.0 0.0 - 2.0 mmol/L   Sodium 136 135 - 145 mmol/L   Potassium 5.4 (H)  3.5 - 5.1 mmol/L   Calcium, Ion 1.04 (L) 1.15 - 1.40 mmol/L   HCT 36.0 36.0 - 46.0 %   Hemoglobin 12.2 12.0 - 15.0 g/dL   Patient temperature 37.0 C    Sample type VENOUS   Heparin level (unfractionated)     Status: Abnormal   Collection Time:  12/18/18  8:58 AM  Result Value Ref Range   Heparin Unfractionated 0.15 (L) 0.30 - 0.70 IU/mL    Comment: (NOTE) If heparin results are below expected values, and patient dosage has  been confirmed, suggest follow up testing of antithrombin III levels. Performed at [facility], [place] [address] [place], [place] [address]   Troponin I - Now Then Q6H     Status: Abnormal   Collection Time: 12/18/18  8:58 AM  Result Value Ref Range   Troponin I 0.21 (HH) <0.03 ng/mL    Comment: CRITICAL VALUE NOTED.  VALUE IS CONSISTENT WITH PREVIOUSLY REPORTED AND CALLED VALUE. Performed at [facility], [place] [address] [place], [place] [address]   Magnesium     Status: Abnormal   Collection Time: 12/18/18  8:58 AM  Result Value Ref Range   Magnesium 1.5 (L) 1.7 - 2.4 mg/dL    Comment: Performed at [facility] [address], [place], [place] [address]  Phosphorus     Status: None   Collection Time: 12/18/18  8:58 AM  Result Value Ref Range   Phosphorus 3.5 2.5 - 4.6 mg/dL    Comment: Performed at [facility] [address], [place], [place] [address]  TSH     Status: None   Collection Time: 12/18/18  8:58 AM  Result Value Ref Range   TSH 1.236 0.350 - 4.500 uIU/mL    Comment: Performed by a 3rd Generation assay with a functional sensitivity of <=0.01 uIU/mL. Performed at [facility], [place] [address] [place], [place] [address]    Dg Chest 2 View  Result Date: 12/17/2018 CLINICAL DATA:  Increased swelling. Hemoptysis. Difficulty breathing. Chest pain. EXAM: CHEST - 2 VIEW COMPARISON:  CT of the abdomen and pelvis 08/29/2018 FINDINGS: Heart is enlarged, stable. Mild pulmonary vascular congestion is present. Right middle lobe pneumonia is present. A right pleural effusion is evident. Minimal atelectasis is present at the left base. IMPRESSION: 1. Right middle lobe pneumonia. 2. Right pleural effusion. 3. Cardiomegaly without failure. Electronically Signed   By:  San Morelle M.D.   On: 12/17/2018 20:37   Ct Angio Chest Pe W And/or Wo Contrast  Result Date: 12/17/2018 CLINICAL DATA:  Hemoptysis, shortness of breath for 2 days. EXAM: CT ANGIOGRAPHY CHEST WITH CONTRAST TECHNIQUE: Multidetector CT imaging of the chest was performed using the standard protocol during bolus administration of intravenous contrast. Multiplanar CT image reconstructions and MIPs were obtained to evaluate the vascular anatomy. CONTRAST:  65mL OMNIPAQUE IOHEXOL 350 MG/ML SOLN COMPARISON:  Chest radiograph December 17, 2018 FINDINGS: CARDIOVASCULAR: Adequate contrast opacification of the pulmonary artery's. Main pulmonary artery is not enlarged. Segmental occlusive pulmonary arterial filling defects and RIGHT middle lobe, and RIGHT lower lobe. Moderate cardiomegaly. No RIGHT heart strain (RV/LV equals 0.8) dilated RIGHT atrium with reflux contrast into the intrahepatic IVC. Moderate pericardial effusion. Thoracic aorta is normal course and caliber. MEDIASTINUM/NODES: No lymphadenopathy by CT size criteria. LUNGS/PLEURA: Tracheobronchial tree is patent, no pneumothorax. Confluent ground-glass opacities RIGHT middle and RIGHT lower lobe. LEFT lower lobe atelectasis. Small RIGHT pleural effusion. UPPER ABDOMEN: Non-acute. Contrast  versus calcifications LEFT kidney. MUSCULOSKELETAL: Non-acute. Mild broad levoscoliosis on this nonweightbearing examination. Review of the MIP images confirms the above findings. IMPRESSION: 1. Acute segmental RIGHT middle and RIGHT lower lobe occlusive pulmonary emboli. No RIGHT heart strain. 2. Confluent RIGHT middle RIGHT lower lobe airspace opacities most consistent with pneumonia, atypical appearance for infarcts. Small RIGHT pleural effusion. 3. Critical Value/emergent results were called by telephone at the time of interpretation on 12/17/2018 at 11:05 pm to Dr. Lajean Saver , who verbally acknowledged these results. Electronically Signed   By: Elon Alas M.D.    On: 12/17/2018 23:06    Pending Labs Unresulted Labs (From admission, onward)    Start     Ordered   12/19/18 0500  Heparin level (unfractionated)  Daily,   R     12/17/18 2320   12/19/18 0500  CBC  Daily,   R     12/17/18 2320   12/19/18 1017  Basic metabolic panel  Tomorrow morning,   R     12/18/18 0839   12/19/18 0500  Brain natriuretic peptide  Tomorrow morning,   R     12/18/18 0839   12/19/18 0500  Magnesium  Tomorrow morning,   STAT     12/18/18 1131   12/18/18 1400  Comprehensive metabolic panel  Once-Timed,   R    Comments:  Page MD with results.    12/18/18 0839   12/18/18 1100  Heparin level (unfractionated)  Once-Timed,   R     12/18/18 0951   12/18/18 0833  Troponin I - Now Then Q6H  Now then every 6 hours,   R     12/18/18 0839   12/18/18 0810  HIV antibody (Routine Screening)  Once,   R     12/18/18 0809   12/18/18 0810  Culture, sputum-assessment  Once,   R    Question:  Patient immune status  Answer:  Immunocompromised   12/18/18 0809   12/18/18 0810  Gram stain  Once,   R    Question:  Patient immune status  Answer:  Immunocompromised   12/18/18 0809   12/18/18 0810  Strep pneumoniae urinary antigen  Once,   R     12/18/18 0809   12/18/18 0809  Culture, blood (routine x 2) Call MD if unable to obtain prior to antibiotics being given  BLOOD CULTURE X 2,   R    Comments:  If blood cultures drawn in Emergency Department - Do not draw and cancel order   Question:  Patient immune status  Answer:  Immunocompromised   12/18/18 0809   12/18/18 0500  Rheumatoid factor  Tomorrow morning,   R     12/17/18 2336   12/17/18 2336  ANA w/Reflex if Positive  Once,   R     12/17/18 2336          Vitals/Pain Today's Vitals   12/18/18 1000 12/18/18 1030 12/18/18 1036 12/18/18 1130  BP: (!) 134/104 122/83  125/85  Pulse: (!) 101 99  98  Resp: (!) 38 (!) 35  (!) 34  Temp:      TempSrc:      SpO2: 100% 100%  100%  Height:      PainSc:   2      Isolation  Precautions No active isolations  Medications Medications  sodium chloride flush (NS) 0.9 % injection 3 mL (0 mLs Intravenous Hold 12/17/18 2247)  azithromycin (ZITHROMAX) 500 mg in sodium chloride 0.9 %  250 mL IVPB (0 mg Intravenous Stopped 12/18/18 0106)  heparin ADULT infusion 100 units/mL (25000 units/246mL sodium chloride 0.45%) (1,400 Units/hr Intravenous New Bag/Given 12/17/18 2351)  ceFEPIme (MAXIPIME) 1 g in sodium chloride 0.9 % 100 mL IVPB (0 g Intravenous Stopped 12/18/18 1001)  ondansetron (ZOFRAN) tablet 4 mg (has no administration in time range)    Or  ondansetron (ZOFRAN) injection 4 mg (has no administration in time range)  sodium chloride flush (NS) 0.9 % injection 3 mL (has no administration in time range)  sodium chloride flush (NS) 0.9 % injection 3 mL (has no administration in time range)  0.9 %  sodium chloride infusion (has no administration in time range)  morphine 2 MG/ML injection 2 mg (2 mg Intravenous Given 12/18/18 0409)  vancomycin (VANCOCIN) 1,500 mg in sodium chloride 0.9 % 500 mL IVPB (has no administration in time range)  vancomycin (VANCOCIN) IVPB 750 mg/150 ml premix (has no administration in time range)  oxyCODONE (Oxy IR/ROXICODONE) immediate release tablet 5 mg (5 mg Oral Given 12/18/18 0923)  acetaminophen (TYLENOL) tablet 650 mg (has no administration in time range)  furosemide (LASIX) injection 40 mg (40 mg Intravenous Given 12/18/18 0923)  albuterol (PROVENTIL) (2.5 MG/3ML) 0.083% nebulizer solution 2.5 mg (has no administration in time range)  magnesium sulfate IVPB 2 g 50 mL (has no administration in time range)  hydrALAZINE (APRESOLINE) injection 5 mg (has no administration in time range)  potassium chloride SA (K-DUR,KLOR-CON) CR tablet 40 mEq (40 mEq Oral Given 12/17/18 2128)  iohexol (OMNIPAQUE) 350 MG/ML injection 75 mL (75 mLs Intravenous Contrast Given 12/17/18 2220)  heparin bolus via infusion 6,000 Units (6,000 Units Intravenous Bolus from Bag 12/17/18  2352)  furosemide (LASIX) injection 40 mg (40 mg Intravenous Given 12/18/18 0054)    Mobility walks Low fall risk   Focused Assessments Cardiac Assessment Handoff:  Cardiac Rhythm: Normal sinus rhythm Lab Results  Component Value Date   TROPONINI 0.21 ([facility]) 12/18/2018   No results found for: DDIMER Does the Patient currently have chest pain? Yes     R Recommendations: See Admitting Provider Note  Report given to:   Additional Notes:

## 2018-12-18 NOTE — ED Notes (Signed)
Explained to him why Patient was not allowed any, and he did not like the answer and walked away upset going to find another nurse, RN stated to him that she would help him but he refused.

## 2018-12-18 NOTE — Progress Notes (Signed)
  Echocardiogram 2D Echocardiogram has been performed.  [name] [name] 12/18/2018, 12:16 PM

## 2018-12-18 NOTE — ED Notes (Signed)
Pt. given cup of ice per RN. 

## 2018-12-18 NOTE — Progress Notes (Signed)
Pharmacy Antibiotic Note  [name] [name] is a 33 y.o. female admitted on 12/17/2018 with pneumonia.  Pharmacy has been consulted for Vancomycin dosing.  Also started on azithromycin and Cefepime per MD.  SCr 1.33 (BL wnl), CrCl ~ 76ml/min  Plan: Vancomycin 1500mg  IV x1, then 750mg  IV every 12h (nomogram dosing d/t AKI) Monitor renal function, Cx and clinical progression to narrow Vancomycin level at steady state  Height: 5' 3.5" (161.3 cm) IBW/kg (Calculated) : 53.55  Temp (24hrs), Avg:99.8 F (37.7 C), Min:99.8 F (37.7 C), Max:99.8 F (37.7 C)  Recent Labs  Lab 12/16/18 1150 12/17/18 2014  WBC 7.1 13.7*  CREATININE 1.12* 1.33*    Estimated Creatinine Clearance: 72.7 mL/min (A) (by C-G formula based on SCr of 1.33 mg/dL (H)).    No Known Allergies  Antimicrobials this admission: Ceftriaxone 3/7 x1 Cefepime 3/7>> Azithro 3/7>> Vanc 3/7>>  Dose adjustments this admission: n/a  Microbiology results: 3/7 BCx: sent 3/7 Sputum: sent  Bertis Ruddy, PharmD Clinical Pharmacist Please check AMION for all [place] numbers 12/18/2018 8:20 AM

## 2018-12-18 NOTE — Consult Note (Signed)
Cardiology Consultation:   Patient ID: [name] [name]; [id number]; 1985/11/13   Admit date: 12/17/2018 Date of Consult: 12/18/2018  Primary Care Provider: Fanny Bien, MD Primary Cardiologist: Dr. Dorris Carnes   Patient Profile:   [name] [name] is a 33 y.o. female with a history of pregnancy-induced hypertension, recent suspected diastolic heart failure who is being seen today for the evaluation of fluid overload at the request of Dr. Algis Liming.  History of Present Illness:   [name] [name] presents to the hospital reporting progressive weight gain with associated abdominal distention, leg swelling, and cough with intermittent blood-tinged sputum.  She states that weight gain began up at least 3 weeks ago.  She had been out of her regular medications for a few days only by report.  She was seen in the cardiology office on March 5 with initiation of Lasix and increase of carvedilol dose and plan for follow-up with repeat echocardiogram.  She presents stating that symptoms have worsened in general, increasing cough with hemoptysis.  She has had no obvious fevers.  Her presenting chest x-ray was concerning for right middle lobe pneumonia and right pleural effusion.  Chest CTA demonstrated acute segmental right middle lobe and right lower lobe pulmonary emboli without obvious right heart strain.  Also right middle and right lower lobe airspace disease consistent with pneumonia, less likely pulmonary infarct.  Also right pleural effusion that was small in size.  She has been admitted to the medicine service for further management, currently on IV heparin.  She was given a dose of Lasix in the ER, not on any standing cardiac medicines at this time.  She tells me that she is approximately 30 pounds over her usual weight.  She also mentions hospital stay in [facility] back in December 2019 with pneumonia and "fluid around the heart."  Past Medical History:  Diagnosis Date  . Condyloma acuminata    . HTN in pregnancy, chronic 09/24/2015   [place] labs: [x ]Cr, [x]  24hr protein (208) P:Cr 92 Plat: 207 AST/ALT: 10/10    . Hypertension   . Obesity in pregnancy    Antepartum, first trimester  . URI (upper respiratory infection)     Past Surgical History:  Procedure Laterality Date  . BIRTH CONTROL IMPLANT Left 03/2016   "Nexplanon in my upper arm"  . CESAREAN SECTION N/A 02/25/2016   Procedure: CESAREAN SECTION;  Surgeon: Truett Mainland, DO;  Location: [facility];  Service: Obstetrics;  Laterality: N/A;     Inpatient Medications: Scheduled Meds: . furosemide  40 mg Intravenous Daily  . sodium chloride flush  3 mL Intravenous Once  . sodium chloride flush  3 mL Intravenous Q12H   Continuous Infusions: . sodium chloride    . azithromycin Stopped (12/18/18 0106)  . ceFEPime (MAXIPIME) IV Stopped (12/18/18 1001)  . heparin 1,650 Units/hr (12/18/18 1258)  . magnesium sulfate 1 - 4 g bolus IVPB    . vancomycin     PRN Meds: sodium chloride, acetaminophen, albuterol, hydrALAZINE, morphine injection, ondansetron **OR** ondansetron (ZOFRAN) IV, oxyCODONE, sodium chloride flush  Allergies:   No Known Allergies  Social History:   Social History   Socioeconomic History  . Marital status: Single    Spouse name: n/a  . Number of children: 0  . Years of education: college  . Highest education level: Not on file  Occupational History  . Occupation: Therapist, art Rep    Employer: [employer]  . Financial resource strain: Not  on file  . Food insecurity:    Worry: Not on file    Inability: Not on file  . Transportation needs:    Medical: Not on file    Non-medical: Not on file  Tobacco Use  . Smoking status: Never Smoker  . Smokeless tobacco: Never Used  Substance and Sexual Activity  . Alcohol use: Never    Frequency: Never  . Drug use: Never  . Sexual activity: Yes    Birth control/protection: None  Lifestyle  . Physical activity:     Days per week: Not on file    Minutes per session: Not on file  . Stress: Not on file  Relationships  . Social connections:    Talks on phone: Not on file    Gets together: Not on file    Attends religious service: Not on file    Active member of club or organization: Not on file    Attends meetings of clubs or organizations: Not on file    Relationship status: Not on file  . Intimate partner violence:    Fear of current or ex partner: Not on file    Emotionally abused: Not on file    Physically abused: Not on file    Forced sexual activity: Not on file  Other Topics Concern  . Not on file  Social History Narrative   Lives alone.  No family lives nearby.    Family History:   The patient's family history includes Heart disease in her mother; Hypertension in her mother.  ROS:  Please see the history of present illness.  All other ROS reviewed and negative.     Physical Exam/Data:   Vitals:   12/18/18 1000 12/18/18 1030 12/18/18 1130 12/18/18 1302  BP: (!) 134/104 122/83 125/85 (!) 121/99  Pulse: (!) 101 99 98 100  Resp: (!) 38 (!) 35 (!) 34 (!) 30  Temp:      TempSrc:      SpO2: 100% 100% 100% 100%  Height:        Intake/Output Summary (Last 24 hours) at 12/18/2018 1454 Last data filed at 12/18/2018 1036 Gross per 24 hour  Intake 450 ml  Output 1500 ml  Net -1050 ml   There were no vitals filed for this visit. Body mass index is 41.29 kg/m.   Gen: Obese woman, no acute distress. HEENT: Conjunctiva and lids normal, oropharynx clear. Neck: Supple, elevated JVP, no carotid bruits. Lungs: Decreased breath sounds and rhonchi right middle lung zone, scattered crackles at the bases, nonlabored breathing at rest. Cardiac: Indistinct PMI with rapid regular rate, no S3, no pericardial rub. Abdomen: Obese, nontender, bowel sounds present. Extremities: 2+ lower leg edema, distal pulses 2+. Skin: Warm and dry. Musculoskeletal: No kyphosis. Neuropsychiatric: Alert and  oriented x3, affect grossly appropriate.  EKG:  I personally reviewed the tracing from 12/17/2018 which showed sinus tachycardia with borderline low voltage, possible biatrial enlargement, nonspecific ST changes.  Telemetry:  I personally reviewed telemetry which shows sinus rhythm with PACs.  Relevant CV Studies:  Echocardiogram 12/18/2018:  1. The left ventricle has severely reduced systolic function, with an ejection fraction of 20-25%. The cavity size was mild to moderately dilated. There is mild concentric left ventricular hypertrophy. Left ventricular diastolic Doppler parameters are  consistent with pseudonormalization Elevated left ventricular end-diastolic pressure.  2. The right ventricle has moderately reduced systolic function. The cavity was normal. There is no increase in right ventricular wall thickness.  3. Left atrial size was  severely dilated.  4. Moderate pericardial effusion.  5. The pericardial effusion is circumferential.  6. The mitral valve is normal in structure. Mild thickening of the mitral valve leaflet.  7. The tricuspid valve is normal in structure.  8. The aortic valve is normal in structure.  9. The pulmonic valve was normal in structure. 10. When compared to the prior study: When compared to the prior study from 10/11/2015 LVEF has decreased significantly from 60-65% to 20-25% with diffuse hypokinesis and moderatel left ventricular dilatation. RVEF is also moderately decreased.  Laboratory Data:  Chemistry Recent Labs  Lab 12/16/18 1150 12/17/18 2014 12/18/18 0103  NA 138 136 136  K 3.8 3.2* 5.4*  CL 104 106  --   CO2 20 23  --   GLUCOSE 79 120*  --   BUN 16 11  --   CREATININE 1.12* 1.33*  --   CALCIUM 8.4* 8.2*  --   GFRNONAA 66 53*  --   GFRAA 76 >60  --   ANIONGAP  --  7  --     No results for input(s): PROT, ALBUMIN, AST, ALT, ALKPHOS, BILITOT in the last 168 hours. Hematology Recent Labs  Lab 12/16/18 1150 12/17/18 2014 12/18/18 0103    WBC 7.1 13.7*  --   RBC 4.49 4.73  --   HGB 10.4* 10.5* 12.2  HCT 34.3 35.9* 36.0  MCV 76* 75.9*  --   MCH 23.2* 22.2*  --   MCHC 30.3* 29.2*  --   RDW 15.9* 16.3*  --   PLT 239 264  --    Cardiac Enzymes Recent Labs  Lab 12/17/18 2215 12/18/18 0858  TROPONINI 0.14* 0.21*    Recent Labs  Lab 12/17/18 2020  TROPIPOC 0.09*    BNP Recent Labs  Lab 12/16/18 1150 12/17/18 2014  BNP  --  836.8*  PROBNP 3,613*  --      Radiology/Studies:  Dg Chest 2 View  Result Date: 12/17/2018 CLINICAL DATA:  Increased swelling. Hemoptysis. Difficulty breathing. Chest pain. EXAM: CHEST - 2 VIEW COMPARISON:  CT of the abdomen and pelvis 08/29/2018 FINDINGS: Heart is enlarged, stable. Mild pulmonary vascular congestion is present. Right middle lobe pneumonia is present. A right pleural effusion is evident. Minimal atelectasis is present at the left base. IMPRESSION: 1. Right middle lobe pneumonia. 2. Right pleural effusion. 3. Cardiomegaly without failure. Electronically Signed   By: San Morelle M.D.   On: 12/17/2018 20:37   Ct Angio Chest Pe W And/or Wo Contrast  Result Date: 12/17/2018 CLINICAL DATA:  Hemoptysis, shortness of breath for 2 days. EXAM: CT ANGIOGRAPHY CHEST WITH CONTRAST TECHNIQUE: Multidetector CT imaging of the chest was performed using the standard protocol during bolus administration of intravenous contrast. Multiplanar CT image reconstructions and MIPs were obtained to evaluate the vascular anatomy. CONTRAST:  69mL OMNIPAQUE IOHEXOL 350 MG/ML SOLN COMPARISON:  Chest radiograph December 17, 2018 FINDINGS: CARDIOVASCULAR: Adequate contrast opacification of the pulmonary artery's. Main pulmonary artery is not enlarged. Segmental occlusive pulmonary arterial filling defects and RIGHT middle lobe, and RIGHT lower lobe. Moderate cardiomegaly. No RIGHT heart strain (RV/LV equals 0.8) dilated RIGHT atrium with reflux contrast into the intrahepatic IVC. Moderate pericardial effusion.  Thoracic aorta is normal course and caliber. MEDIASTINUM/NODES: No lymphadenopathy by CT size criteria. LUNGS/PLEURA: Tracheobronchial tree is patent, no pneumothorax. Confluent ground-glass opacities RIGHT middle and RIGHT lower lobe. LEFT lower lobe atelectasis. Small RIGHT pleural effusion. UPPER ABDOMEN: Non-acute. Contrast versus calcifications LEFT kidney. MUSCULOSKELETAL: Non-acute. Mild  broad levoscoliosis on this nonweightbearing examination. Review of the MIP images confirms the above findings. IMPRESSION: 1. Acute segmental RIGHT middle and RIGHT lower lobe occlusive pulmonary emboli. No RIGHT heart strain. 2. Confluent RIGHT middle RIGHT lower lobe airspace opacities most consistent with pneumonia, atypical appearance for infarcts. Small RIGHT pleural effusion. 3. Critical Value/emergent results were called by telephone at the time of interpretation on 12/17/2018 at 11:05 pm to Dr. Lajean Saver , who verbally acknowledged these results. Electronically Signed   By: Elon Alas M.D.   On: 12/17/2018 23:06   Vas [place] Lower Extremity Venous (dvt)  Result Date: 12/18/2018  Lower Venous Study Indications: Pulmonary embolism, and hormone contraception use.  Performing Technologist: Maudry Mayhew MHA, RDMS, RVT, RDCS  Examination Guidelines: A complete evaluation includes B-mode imaging, spectral Doppler, color Doppler, and power Doppler as needed of all accessible portions of each vessel. Bilateral testing is considered an integral part of a complete examination. Limited examinations for reoccurring indications may be performed as noted.  Right Venous Findings: +---------+---------------+---------+-----------+----------+-------+          CompressibilityPhasicitySpontaneityPropertiesSummary +---------+---------------+---------+-----------+----------+-------+ CFV      Full           Yes      Yes                          +---------+---------------+---------+-----------+----------+-------+  SFJ      Full                                                 +---------+---------------+---------+-----------+----------+-------+ FV Prox  Full                                                 +---------+---------------+---------+-----------+----------+-------+ FV Mid   Full                                                 +---------+---------------+---------+-----------+----------+-------+ FV DistalFull                                                 +---------+---------------+---------+-----------+----------+-------+ POP      Full           Yes      Yes                          +---------+---------------+---------+-----------+----------+-------+ PTV      Full                                                 +---------+---------------+---------+-----------+----------+-------+ PERO     Full                                                 +---------+---------------+---------+-----------+----------+-------+  Left Venous Findings: +---------+---------------+---------+-----------+----------+--------------+          CompressibilityPhasicitySpontaneityPropertiesSummary        +---------+---------------+---------+-----------+----------+--------------+ CFV      Full           Yes      Yes                                 +---------+---------------+---------+-----------+----------+--------------+ SFJ      Full                                                        +---------+---------------+---------+-----------+----------+--------------+ FV Prox  Full                                                        +---------+---------------+---------+-----------+----------+--------------+ FV Mid   Full                                                        +---------+---------------+---------+-----------+----------+--------------+ FV Distal                                             Not visualized  +---------+---------------+---------+-----------+----------+--------------+ POP      Full           Yes      Yes                                 +---------+---------------+---------+-----------+----------+--------------+ PTV      Full                                                        +---------+---------------+---------+-----------+----------+--------------+ PERO     Full                                                        +---------+---------------+---------+-----------+----------+--------------+    Summary: Right: There is no evidence of deep vein thrombosis in the lower extremity. No cystic structure found in the popliteal fossa. Left: There is no evidence of deep vein thrombosis in the lower extremity. However, portions of this examination were limited- see technologist comments above. No cystic structure found in the popliteal fossa.  *See table(s) above for measurements and observations.    Preliminary     Assessment and Plan:   1.  Acute systolic heart failure with significant fluid overload.  Patient reports approximately 30 pound weight gain over a period of weeks.  She has a  history of diastolic dysfunction but no previous cardiomyopathy.  Follow-up echocardiogram today reveals LVEF 20 to 25% range with mild to moderate LV chamber dilatation and diffuse hypokinesis concerning for nonischemic cardiomyopathy.  She has moderate diastolic dysfunction and also moderately reduced right ventricular contraction.  Previous LVEF was 60 to 65% 4 years ago.  2.  Acute segmental pulmonary emboli involving the right middle and lower lobes.  3.  Right middle lobe infiltrate, pneumonia, less likely pulmonary infarct.  4.  Mild troponin I elevation, 0.14 and 0.21.  Most likely consistent with cardiomyopathy with systolic heart failure and also acute pulmonary emboli.  5.  Acute renal insufficiency, creatinine 1.12 and 1.33.  6.  Essential hypertension, current systolics running  403K to 150s.  7.  Moderate sized circumferential pericardial effusion.  Continue IV Lasix, currently at 40 mg twice daily (may need higher dose or potentially Lasix infusion depending on adequacy of diuresis and hemodynamic stability with pulmonary emboli), follow urine output and subsequent creatinine tomorrow.  We will hold off on resuming ACE inhibitor at this time.  In light of physiologic demands with increased heart rate in the setting of pulmonary emboli and also cardiomyopathy with severely reduced LVEF, would not resume her Coreg as yet particularly until volume status is better managed.  Plan to start combination of hydralazine and Imdur for now for gentle afterload reduction.  Suggest consultation with the Heart Failure team.  Signed, Rozann Lesches, MD  12/18/2018 2:54 PM

## 2018-12-19 ENCOUNTER — Inpatient Hospital Stay (HOSPITAL_COMMUNITY): Payer: 59

## 2018-12-19 DIAGNOSIS — I471 Supraventricular tachycardia, unspecified: Secondary | ICD-10-CM | POA: Diagnosis present

## 2018-12-19 DIAGNOSIS — R570 Cardiogenic shock: Secondary | ICD-10-CM | POA: Diagnosis present

## 2018-12-19 DIAGNOSIS — R042 Hemoptysis: Secondary | ICD-10-CM

## 2018-12-19 LAB — HEPATIC FUNCTION PANEL
ALT: 46 U/L — ABNORMAL HIGH (ref 0–44)
AST: 20 U/L (ref 15–41)
Albumin: 2.1 g/dL — ABNORMAL LOW (ref 3.5–5.0)
Alkaline Phosphatase: 36 U/L — ABNORMAL LOW (ref 38–126)
Bilirubin, Direct: 1 mg/dL — ABNORMAL HIGH (ref 0.0–0.2)
Indirect Bilirubin: 2.4 mg/dL — ABNORMAL HIGH (ref 0.3–0.9)
Total Bilirubin: 3.4 mg/dL — ABNORMAL HIGH (ref 0.3–1.2)
Total Protein: 5.4 g/dL — ABNORMAL LOW (ref 6.5–8.1)

## 2018-12-19 LAB — HIV ANTIBODY (ROUTINE TESTING W REFLEX): HIV Screen 4th Generation wRfx: NONREACTIVE

## 2018-12-19 LAB — RESPIRATORY PANEL BY PCR
Adenovirus: NOT DETECTED
Bordetella pertussis: NOT DETECTED
CHLAMYDOPHILA PNEUMONIAE-RVPPCR: NOT DETECTED
CORONAVIRUS NL63-RVPPCR: NOT DETECTED
Coronavirus 229E: NOT DETECTED
Coronavirus HKU1: NOT DETECTED
Coronavirus OC43: NOT DETECTED
INFLUENZA A-RVPPCR: NOT DETECTED
Influenza B: NOT DETECTED
Metapneumovirus: NOT DETECTED
Mycoplasma pneumoniae: NOT DETECTED
PARAINFLUENZA VIRUS 4-RVPPCR: NOT DETECTED
Parainfluenza Virus 1: NOT DETECTED
Parainfluenza Virus 2: NOT DETECTED
Parainfluenza Virus 3: NOT DETECTED
Respiratory Syncytial Virus: NOT DETECTED
Rhinovirus / Enterovirus: NOT DETECTED

## 2018-12-19 LAB — BASIC METABOLIC PANEL
Anion gap: 7 (ref 5–15)
BUN: 10 mg/dL (ref 6–20)
CHLORIDE: 104 mmol/L (ref 98–111)
CO2: 24 mmol/L (ref 22–32)
Calcium: 8.1 mg/dL — ABNORMAL LOW (ref 8.9–10.3)
Creatinine, Ser: 1 mg/dL (ref 0.44–1.00)
GFR calc Af Amer: >60 mL/min (ref >60)
GFR calc non Af Amer: >60 mL/min (ref >60)
Glucose, Bld: 89 mg/dL (ref 70–99)
Potassium: 3.7 mmol/L (ref 3.5–5.1)
Sodium: 135 mmol/L (ref 135–145)

## 2018-12-19 LAB — CBC
HCT: 31.4 % — ABNORMAL LOW (ref 36.0–46.0)
Hemoglobin: 9.3 g/dL — ABNORMAL LOW (ref 12.0–15.0)
MCH: 22.9 pg — ABNORMAL LOW (ref 26.0–34.0)
MCHC: 29.6 g/dL — ABNORMAL LOW (ref 30.0–36.0)
MCV: 77.1 fL — ABNORMAL LOW (ref 80.0–100.0)
Platelets: 218 10*3/uL (ref 150–400)
RBC: 4.07 MIL/uL (ref 3.87–5.11)
RDW: 16.4 % — ABNORMAL HIGH (ref 11.5–15.5)
WBC: 15.7 10*3/uL — ABNORMAL HIGH (ref 4.0–10.5)
nRBC: 0 % (ref 0.0–0.2)

## 2018-12-19 LAB — MAGNESIUM: Magnesium: 2 mg/dL (ref 1.7–2.4)

## 2018-12-19 LAB — HEPARIN LEVEL (UNFRACTIONATED)
Heparin Unfractionated: 0.19 IU/mL — ABNORMAL LOW (ref 0.30–0.70)
Heparin Unfractionated: 0.36 IU/mL (ref 0.30–0.70)

## 2018-12-19 LAB — BRAIN NATRIURETIC PEPTIDE: B Natriuretic Peptide: 192.4 pg/mL — ABNORMAL HIGH (ref 0.0–100.0)

## 2018-12-19 LAB — MRSA PCR SCREENING: MRSA by PCR: NEGATIVE

## 2018-12-19 LAB — INFLUENZA PANEL BY PCR (TYPE A & B)
Influenza A By PCR: NEGATIVE
Influenza B By PCR: NEGATIVE

## 2018-12-19 LAB — RHEUMATOID FACTOR: Rheumatoid fact SerPl-aCnc: <10 IU/mL (ref 0.0–13.9)

## 2018-12-19 MED ORDER — METOPROLOL TARTRATE 5 MG/5ML IV SOLN
INTRAVENOUS | Status: AC
  Filled 2018-12-19: qty 10

## 2018-12-19 MED ORDER — AMIODARONE HCL IN DEXTROSE 360-4.14 MG/200ML-% IV SOLN
30.0000 mg/h | INTRAVENOUS | Status: DC
  Administered 2018-12-20 – 2018-12-24 (×9): 30 mg/h via INTRAVENOUS
  Filled 2018-12-19 (×10): qty 200

## 2018-12-19 MED ORDER — ADENOSINE 6 MG/2ML IV SOLN
INTRAVENOUS | Status: AC
  Administered 2018-12-19: 6 mg via INTRAVENOUS
  Filled 2018-12-19: qty 2

## 2018-12-19 MED ORDER — POTASSIUM CHLORIDE CRYS ER 20 MEQ PO TBCR
20.0000 meq | EXTENDED_RELEASE_TABLET | Freq: Every day | ORAL | Status: DC
  Administered 2018-12-19 – 2018-12-20 (×2): 20 meq via ORAL
  Filled 2018-12-19 (×2): qty 1

## 2018-12-19 MED ORDER — ADENOSINE 6 MG/2ML IV SOLN
INTRAVENOUS | Status: AC
  Administered 2018-12-19: 12 mg via INTRAVENOUS
  Filled 2018-12-19: qty 4

## 2018-12-19 MED ORDER — SPIRONOLACTONE 12.5 MG HALF TABLET
12.5000 mg | ORAL_TABLET | Freq: Every day | ORAL | Status: DC
  Administered 2018-12-19 – 2018-12-20 (×2): 12.5 mg via ORAL
  Filled 2018-12-19 (×2): qty 1

## 2018-12-19 MED ORDER — FUROSEMIDE 10 MG/ML IJ SOLN
10.0000 mg/h | INTRAVENOUS | Status: DC
  Administered 2018-12-19: 4 mg/h via INTRAVENOUS
  Administered 2018-12-21 (×2): 10 mg/h via INTRAVENOUS
  Filled 2018-12-19 (×3): qty 25

## 2018-12-19 MED ORDER — HYDRALAZINE HCL 10 MG PO TABS
20.0000 mg | ORAL_TABLET | Freq: Three times a day (TID) | ORAL | Status: DC
  Administered 2018-12-19 – 2018-12-21 (×6): 20 mg via ORAL
  Filled 2018-12-19 (×6): qty 2

## 2018-12-19 MED ORDER — AMIODARONE HCL IN DEXTROSE 360-4.14 MG/200ML-% IV SOLN
60.0000 mg/h | INTRAVENOUS | Status: DC
  Administered 2018-12-19 (×2): 60 mg/h via INTRAVENOUS
  Filled 2018-12-19: qty 200

## 2018-12-19 MED ORDER — SODIUM CHLORIDE 0.9 % IV SOLN
1.0000 g | INTRAVENOUS | Status: DC
  Administered 2018-12-19: 1 g via INTRAVENOUS
  Filled 2018-12-19: qty 10

## 2018-12-19 MED ORDER — AMIODARONE LOAD VIA INFUSION
150.0000 mg | Freq: Once | INTRAVENOUS | Status: AC
  Administered 2018-12-19: 150 mg via INTRAVENOUS
  Filled 2018-12-19: qty 83.34

## 2018-12-19 MED ORDER — ADENOSINE 6 MG/2ML IV SOLN
6.0000 mg | Freq: Once | INTRAVENOUS | Status: AC
  Administered 2018-12-19: 6 mg via INTRAVENOUS
  Administered 2018-12-19: 12 mg via INTRAVENOUS

## 2018-12-19 MED ORDER — SODIUM CHLORIDE 0.9 % IV SOLN
2.0000 g | INTRAVENOUS | Status: AC
  Administered 2018-12-19: 2 g via INTRAVENOUS
  Filled 2018-12-19: qty 2

## 2018-12-19 MED ORDER — SODIUM CHLORIDE 0.9 % IV SOLN
2.0000 g | Freq: Three times a day (TID) | INTRAVENOUS | Status: DC
  Administered 2018-12-19 – 2018-12-22 (×8): 2 g via INTRAVENOUS
  Filled 2018-12-19 (×10): qty 2

## 2018-12-19 NOTE — Significant Event (Signed)
Rapid Response Event Note  Overview: Time Called: 1529 Arrival Time: 1532 Event Type: Cardiac  Initial Focused Assessment: Patient in SVT 180-190.  Admitted with HCAP & PE on heparin gtt and lasix gtt.  IV antibiotics She is alert and oriented.  States she has pain with inspiration 8/10 Temp 103.1 oral  BP 117/71  HR 170-190  RR 30s  O2 sat 96% 2mg  morphine recently given for pain Tylenol given for temp Some anxiety component.    Interventions: Oxy given for inspiratory chest pain  Ice packs Placed on droplet precautions  6mg  Adenosine given rapid IV push, no change in HR 12mg  Adenosine given rapid IV push  Minimal change in HR.  Eventually slowed to 140s Patient began to feel better,  Pain now 2/10, still feels "hot" but better. HR now ST 120s Amiodarone 150mg  bolus and gtt started   Transferred to 2H15 via bed with O2 and zoll heart monitor.   Friend to bedside side. MD notifying husband of events and transfer   Plan of Care (if not transferred):  Event Summary: Name of Physician Notified: Dr Algis Liming at bedside   Name of Consulting Physician Notified: Dr Debara Pickett at bedside   Outcome: Transferred (Comment)(2H15)  Event End Time: [place]  Raliegh Ip

## 2018-12-19 NOTE — Progress Notes (Signed)
Pharmacy Antibiotic Note  [name] [name] is a 33 y.o. female admitted on 12/17/2018 with pneumonia. Vancomycin and Cefepime were discontinued earlier today 12/19/18.  Now to restart Cefepime for sepsis,  Tc 102.7, Tm 103.1 WBC 13.7>16, MD noted patientmildly tachypneic and in some painful distress, HRin the 160s-180s, SVT on monitor, sustained. BP okay SCr decreased to 1.0 today, , CrCl ~ 90 ml/min  Plan: Restart  Cefepime 2g IV q8h  Monitor renal function, Cx and clinical progression to narrow   Height: 5' 3.5" (161.3 cm) Weight: 227 lb 4.7 oz (103.1 kg) IBW/kg (Calculated) : 53.55  Temp (24hrs), Avg:100.5 F (38.1 C), Min:98 F (36.7 C), Max:103.1 F (39.5 C)  Recent Labs  Lab 12/16/18 1150 12/17/18 2014 12/18/18 1431 12/19/18 0943  WBC 7.1 13.7*  --  16.0*  CREATININE 1.12* 1.33* 1.26* 1.00    Estimated Creatinine Clearance: 94.5 mL/min (by C-G formula based on SCr of 1 mg/dL).    No Known Allergies  Antimicrobials this admission: Cefepime 3/7>>3/8; resume 3/8>> Azithro 3/7>>3/8 Vanc 3/7>>3/8 Ceftriaxone 3/8 x1  Dose adjustments this admission: n/a  Microbiology results: 3/7 BCx: ngtd <24 hrs 3/8 Resp PCR: sent  Nicole Cella, RPh Clinical Pharmacist Please check AMION for all [place] numbers 12/19/2018 4:41 PM

## 2018-12-19 NOTE — Progress Notes (Signed)
NAME:  [name] [name], MRN:  [id number], DOB:  [birth year], LOS: 2 ADMISSION DATE:  12/17/2018, CONSULTATION DATE:  3/6 REFERRING MD:  [place] , CHIEF COMPLAINT:  SOB    Brief History   33 yr old F w/ presents with SOB found to have right middle and right lower lobe segmental pulmonary emboli with airspace opacities in the same lobes with right pleural effusion. PCCM consulted on the floor for recommendations for AHRF. Today developed SVT/AFRVR. Will be transferred to ICU.  Past Medical History  PMHx Pregnancy induced HTN, condyloma acuminata, morbid obesity  Significant Hospital Events   3/6 Admitted 3/8 SVT requiring ICU transfer for potential cardioversion  Consults:  PCCM 3/6   Procedures:    Significant Diagnostic Tests:  CTA 3/6: Segmental occlusion involving RML and RLL pulmonary artery, RML and RLL GGO and small right pleural effusion TTE 3/7: EF 20-25% compared to EF 60-65% in 2016, reduced RV systolic function, LA dilation, moderate pericardial effusion LE Dopplers 3/8: No LE DVT  Micro Data:  BCX 3/7 > Sputum CX sent  Antimicrobials:  Azithro 3/6->> Maxipime 3/7>> Vanc 3/7 >>  Interim history/subjective:  Reports improved dyspnea. However still has mild hemoptysis.  Objective   Blood pressure 131/63, pulse (!) 118, temperature (!) 103.1 F (39.5 C), temperature source Oral, resp. rate (!) 30, height 5' 3.5" (1.613 m), weight 103.1 kg, SpO2 100 %.        Intake/Output Summary (Last 24 hours) at 12/19/2018 1614 Last data filed at 12/19/2018 0700 Gross per 24 hour  Intake 1241.66 ml  Output 850 ml  Net 391.66 ml   Filed Weights   12/18/18 1627  Weight: 103.1 kg    Physical Exam: General: Well-appearing female, no acute distress HENT: [place], AT, OP clear, MMM Eyes: EOMI, no scleral icterus Respiratory: Clear to auscultation bilaterally.  No crackles, wheezing or rales Cardiovascular: RRR, -M/R/G, no JVD GI: BS+, soft,  nontender Extremities:-Edema,-tenderness Neuro: AAO x4, CNII-XII grossly intact Skin: Intact, no rashes or bruising. LUE with nexplanon implant, nontender Psych: Normal mood, normal affect   Resolved Hospital Problem list     Assessment & Plan:   SVT s/p adenosine Primary team transferring to ICU  Telemetry IV amiodarone Cardiology following - Recommends HF to see patient tomorrow  New Fever, worsening leukocytosis Acute respiratory failure secondary to suspected heart failure, pneumonia and PEs Her symptoms are likely multifactorial with immediate relief related to treated of volume overload. Became febrile on 3/6 Plan Broaden antibiotics Repeat sputum culture, blood culture, RVP Wean supplemental oxygen for goal >90-92% Diuresis per Cardiology Encourage OOB and incentive spirometry  Acute systolic heart failure Pericardial effusion TTE with EF 20-25% compared to normal EF in 2016. Unclear etiology. Does not report symptoms of viral illness preceding admission. Plan Cardiology following Agree with diuresis Obtain RVP   Right-sided segmental pulmonary emboli LE Dopplers negative Plan Agree with systemic anticoagulation Pain control Advised patient for nexplanon implant removal as soon as able and to seek alternative birth control options (PCP scheduled 3/20) as she is unable to have it removed as inpatient  Community-acquired pneumonia Recent hospitalization however no risk factors for MRSA or Pseudomonas.  Continue antibiotics  Hemoptysis No bronchoscopy indicated as likely secondary PE and/or pulmonary edema in setting of anticoagulation Close observation  Hypomagnesium  Improved Trend Mag  Best practice:  Diet: NPO Pain/Anxiety/Delirium protocol (if indicated): na VAP protocol (if indicated): na DVT prophylaxis: Hep drip  GI prophylaxis: na  Glucose control: monitor BS  Mobility:  OOB as tolerated Code Status: full  Family Communication: Update  patient Disposition: Transferred to ICU  Labs   CBC: Recent Labs  Lab 12/16/18 1150 12/17/18 2014 12/18/18 0103 12/19/18 0943  WBC 7.1 13.7*  --  16.0*  HGB 10.4* 10.5* 12.2 9.2*  HCT 34.3 35.9* 36.0 31.6*  MCV 76* 75.9*  --  77.3*  PLT 239 264  --  388    Basic Metabolic Panel: Recent Labs  Lab 12/16/18 1150 12/17/18 2014 12/18/18 0103 12/18/18 0858 12/18/18 1431 12/19/18 0943  NA 138 136 136  --  138 135  K 3.8 3.2* 5.4*  --  3.2* 3.7  CL 104 106  --   --  104 104  CO2 20 23  --   --  25 24  GLUCOSE 79 120*  --   --  90 89  BUN 16 11  --   --  10 10  CREATININE 1.12* 1.33*  --   --  1.26* 1.00  CALCIUM 8.4* 8.2*  --   --  7.9* 8.1*  MG  --   --   --  1.5*  --  2.0  PHOS  --   --   --  3.5  --   --    GFR: Estimated Creatinine Clearance: 94.5 mL/min (by C-G formula based on SCr of 1 mg/dL). Recent Labs  Lab 12/16/18 1150 12/17/18 2014 12/19/18 0943  WBC 7.1 13.7* 16.0*    Liver Function Tests: Recent Labs  Lab 12/18/18 1431 12/19/18 0943  AST 25 20  ALT 58* 46*  ALKPHOS 36* 36*  BILITOT 3.7* 3.4*  PROT 5.2* 5.4*  ALBUMIN 2.3* 2.1*   No results for input(s): LIPASE, AMYLASE in the last 168 hours. No results for input(s): AMMONIA in the last 168 hours.  ABG    Component Value Date/Time   HCO3 23.8 12/18/2018 0103   TCO2 25 12/18/2018 0103   O2SAT 94.0 12/18/2018 0103     Coagulation Profile: No results for input(s): INR, PROTIME in the last 168 hours.  Cardiac Enzymes: Recent Labs  Lab 12/17/18 2215 12/18/18 0858 12/18/18 1431 12/18/18 2034  TROPONINI 0.14* 0.21* 0.21* 0.16*    HbA1C: No results found for: HGBA1C  CBG: No results for input(s): GLUCAP in the last 168 hours.   The patient is critically ill with multiple organ systems failure and requires high complexity decision making for assessment and support, frequent evaluation and titration of therapies, application of advanced monitoring technologies and extensive  interpretation of multiple databases.   Critical Care Time devoted to patient care services described in this note is 31 Minutes. This time reflects time of care of this signee Dr. Rodman Pickle. This critical care time does not reflect procedure time, or teaching time or supervisory time of PA/NP/Med student/Med Resident etc but could involve care discussion time.  Rodman Pickle, M.D. [facility] Pulmonary/Critical Care Medicine Pager: [phone] After hours pager: [phone]

## 2018-12-19 NOTE — Progress Notes (Signed)
Called to the beside to see Mrs. [name] [name] - she was noted to go into an SVT at 160-180 just after 3PM. Since then, she has complained of worsening right-sided chest pain, shortness of breath and was just found to have high fever of 103F. I personally evaluated her at the bedside and reviewed her telemetry and discussed the case with Dr. Algis Liming. She was noted to have a newly reduced LVEF of 20-25%. She appears to be becoming progressively more decompensated.  I discussed options with her. We briefly tried vagal maneuvers without success. I recommended we try to convert her SVT with adenosine. I explained the process of chemical cardioversion to her and she provided verbal consent to proceed. She was given 6 mg with no effect - she was then given 12 mg which did slow her down to 120 and she appeared to be in a sinus tach, however, the rate picked up again. She continued to be more short of breath and uncomfortable, so plans were made for DCCV and MICU transfer. While preparing for cardioversion, she was noted to spontaneously revert back to sinus tachycardia in the 120's. She reported she felt more comfortable at present.  D/w Dr. Algis Liming - would recommend she be monitored in the ICU, especially given her high fever, pneumonia, pulmonary emboli and biventricular failure. We will start IV amiodarone to see if we can keep her out of SVT until she improves. Plan for the advanced heart failure service to see her tomorrow morning on rounds.  Cardiology available overnight as needed - please contact the fellow on call.  CRITICAL CARE TIME: I have spent a total of 60 minutes with patient reviewing hospital notes, telemetry, EKGs, labs and examining the patient as well as establishing an assessment and plan that was discussed with the patient. > 50% of time was spent in direct patient care. The patient is critically ill with multi-organ system failure and requires high complexity decision making for assessment  and support, frequent evaluation and titration of therapies, application of advanced monitoring technologies and extensive interpretation of multiple databases.  Pixie Casino, MD, [facility], [place] Director of the Advanced Lipid Disorders &  Cardiovascular Risk Reduction Clinic Diplomate of the American Board of Clinical Lipidology Attending Cardiologist  Direct Dial: [phone]  Fax: [phone]  Website:  [email or web address]

## 2018-12-19 NOTE — Progress Notes (Signed)
Consult for DOAC- patient insured through [employer]. Will qualify for 30 day Free and $10 copay card for either DOAC. Patient may qualify for additional discount if filled at a [facility] outpatient pharmacy.

## 2018-12-19 NOTE — Progress Notes (Signed)
NAME:  [name] [name], MRN:  [id number], DOB:  [birth year], LOS: 2 ADMISSION DATE:  12/17/2018, CONSULTATION DATE:  3/6 REFERRING MD:  Duotova , CHIEF COMPLAINT:  SOB    Brief History   33 yr old F w/ PMHx Pregnancy induced HTN, condyloma acuminata, morbid obesoty presents with SOB found to have right middle and right lower lobe segmental pulmonary emboli with airspace opacities in the same lobes with right pleural effusion. PCCM consulted for assistance in mgmt  History of present illness   Pt has a PMHx of HTN,Condyloma acuminata, iron deficiency anemia,  HFpEF  Who presented to [facility] with complaints of shortness of breath, pain on deep inspiration and blood streaked sputum.  Pt states that she has been weak with increased fatigue for the last few weeks and has noticed prior to her SOB that her abdomen is larger than normal and its difficult for her to button her pants.  She also states that on Tuesday 3rd March she noticed while sitting down that her legs began to swell. This continues through the week and did not improve with elevation. She states that she was seen by Cardiology outpatient on 12/16/2018 - she was out of lisinopril for 2 weeks prior to this visit and due to her lower ext edema and abdominal distension she was switched from chlorthalidone to lasix.  She states that she can not quantify the hemoptysis she just noticed when she coughed the mucus was blood tinged and sometimes more blood than mucus (tsp fulls at a time).  The increased work of breathing and chest pain prompted her to seek medical care on 12/17/2018.   Past Medical History    Significant Hospital Events     Consults:  PCCM 3/6   Procedures:    Significant Diagnostic Tests:  TTE 3/7: EF 20-25% compared to EF 60-65% in 2016, reduced RV systolic function, LA dilation, moderate pericardial effusion LE Dopplers 3/8: No LE DVT  Micro Data:  BCX 3/7 > Sputum CX sent  Antimicrobials:  Azithro 3/6->> Maxipime  3/7>> Vanc 3/7 >>  Interim history/subjective:  Reports improved dyspnea. However still has mild hemoptysis.  Objective   Blood pressure (!) 136/97, pulse (!) 113, temperature 99.7 F (37.6 C), temperature source Oral, resp. rate (!) 28, height 5' 3.5" (1.613 m), weight 103.1 kg, SpO2 100 %.        Intake/Output Summary (Last 24 hours) at 12/19/2018 1104 Last data filed at 12/19/2018 0700 Gross per 24 hour  Intake 2038.24 ml  Output 850 ml  Net 1188.24 ml   Filed Weights   12/18/18 1627  Weight: 103.1 kg    Physical Exam: General: Well-appearing female, no acute distress HENT: [place], AT, OP clear, MMM Eyes: EOMI, no scleral icterus Respiratory: Clear to auscultation bilaterally.  No crackles, wheezing or rales Cardiovascular: RRR, -M/R/G, no JVD GI: BS+, soft, nontender Extremities:-Edema,-tenderness Neuro: AAO x4, CNII-XII grossly intact Skin: Intact, no rashes or bruising. LUE with nexplanon implant, nontender Psych: Normal mood, normal affect  I personally reviewed echo and LE dopplers as noted above.  Resolved Hospital Problem list     Assessment & Plan:   Acute respiratory failure secondary to suspected heart failure, pneumonia and PEs Her symptoms are likely multifactorial with immediate relief related to treated of volume overload.  Plan Management as below with anticoagulation, antibiotics and diuresis. Wean supplemental oxygen for goal >90-92% Diuresis per Cardiology Encourage OOB and incentive spirometry  Right-sided segmental pulmonary emboli LE Dopplers negative Plan Agree with  systemic anticoagulation Pain control Advised patient for nexplanon implant removal as soon as able and to seek alternative birth control options (PCP scheduled 3/20) as she is unable to have it removed as inpatient  Community-acquired pneumonia Recent hospitalization however no risk factors for MRSA or Pseudomonas.  Recommend deescalating to CAP coverage x 5 days  total  Acute systolic heart failure Pericardial effusion Cardiology following Agree with diuresis   Hemoptysis No bronchoscopy indicated as likely secondary PE and/or pulmonary edema in setting of anticoagulation Close observation  Hypomagnesium  Improved Trend Mag  Pulmonary team will sign off. Please call for any concerns or questions.  Best practice:  Diet: Diet Pain/Anxiety/Delirium protocol (if indicated): na VAP protocol (if indicated): na DVT prophylaxis: Hep drip  GI prophylaxis: na  Glucose control: monitor BS  Mobility: OOB as tolerated Code Status: full  Family Communication: Update patient Disposition: Admitted by Hospitalist, PCCM consulting   Labs   CBC: Recent Labs  Lab 12/16/18 1150 12/17/18 2014 12/18/18 0103 12/19/18 0943  WBC 7.1 13.7*  --  16.0*  HGB 10.4* 10.5* 12.2 9.2*  HCT 34.3 35.9* 36.0 31.6*  MCV 76* 75.9*  --  77.3*  PLT 239 264  --  229    Basic Metabolic Panel: Recent Labs  Lab 12/16/18 1150 12/17/18 2014 12/18/18 0103 12/18/18 0858 12/18/18 1431 12/19/18 0943  NA 138 136 136  --  138 135  K 3.8 3.2* 5.4*  --  3.2* 3.7  CL 104 106  --   --  104 104  CO2 20 23  --   --  25 24  GLUCOSE 79 120*  --   --  90 89  BUN 16 11  --   --  10 10  CREATININE 1.12* 1.33*  --   --  1.26* 1.00  CALCIUM 8.4* 8.2*  --   --  7.9* 8.1*  MG  --   --   --  1.5*  --  2.0  PHOS  --   --   --  3.5  --   --    GFR: Estimated Creatinine Clearance: 94.5 mL/min (by C-G formula based on SCr of 1 mg/dL). Recent Labs  Lab 12/16/18 1150 12/17/18 2014 12/19/18 0943  WBC 7.1 13.7* 16.0*    Liver Function Tests: Recent Labs  Lab 12/18/18 1431 12/19/18 0943  AST 25 20  ALT 58* 46*  ALKPHOS 36* 36*  BILITOT 3.7* 3.4*  PROT 5.2* 5.4*  ALBUMIN 2.3* 2.1*   No results for input(s): LIPASE, AMYLASE in the last 168 hours. No results for input(s): AMMONIA in the last 168 hours.  ABG    Component Value Date/Time   HCO3 23.8 12/18/2018 0103    TCO2 25 12/18/2018 0103   O2SAT 94.0 12/18/2018 0103     Coagulation Profile: No results for input(s): INR, PROTIME in the last 168 hours.  Cardiac Enzymes: Recent Labs  Lab 12/17/18 2215 12/18/18 0858 12/18/18 1431 12/18/18 2034  TROPONINI 0.14* 0.21* 0.21* 0.16*    HbA1C: No results found for: HGBA1C  CBG: No results for input(s): GLUCAP in the last 168 hours.   Home Medications  Prior to Admission medications   Medication Sig Start Date End Date Taking? Authorizing Provider  amLODipine (NORVASC) 10 MG tablet Take 1 tablet (10 mg total) by mouth daily. 12/16/18  Yes Daune Perch, NP  carvedilol (COREG) 25 MG tablet Take 1 tablet (25 mg total) by mouth 2 (two) times daily. 12/16/18  Yes Phylliss Bob,  Janine, NP  colchicine 0.6 MG tablet Take 0.6 mg by mouth daily.   Yes [provider]  ferrous sulfate 325 (65 FE) MG tablet Take 1 tablet (325 mg total) by mouth daily with breakfast. 08/31/18  Yes Seawell, Jaimie A, DO  furosemide (LASIX) 40 MG tablet Take 1 tablet (40 mg total) by mouth daily. 12/16/18  Yes Daune Perch, NP  lisinopril (PRINIVIL,ZESTRIL) 40 MG tablet Take 1 tablet (40 mg total) by mouth daily. 12/16/18  Yes Daune Perch, NP  spironolactone (ALDACTONE) 25 MG tablet Take 25 mg by mouth daily.   Yes [provider]  etonogestrel (NEXPLANON) 68 MG IMPL implant 1 each by Subdermal route once.    [provider]     Rodman Pickle, M.D. [facility] Pulmonary/Critical Care Medicine Pager: [phone] After hours pager: [phone]

## 2018-12-19 NOTE — Progress Notes (Signed)
ANTICOAGULATION CONSULT NOTE  Pharmacy Consult for heparin Indication: pulmonary embolus  No Known Allergies  Patient Measurements: Height: 5' 3.5" (161.3 cm) Weight: 227 lb 4.7 oz (103.1 kg) IBW/kg (Calculated) : 53.55 Heparin Dosing Weight: 78.1  Vital Signs: Temp: 99.7 F (37.6 C) (03/08 0727) Temp Source: Oral (03/08 0727) BP: 132/108 (03/08 1100) Pulse Rate: 116 (03/08 1100)  Labs: Recent Labs    12/16/18 1150  12/17/18 2014  12/18/18 0103  12/18/18 0858 12/18/18 1136 12/18/18 1431 12/18/18 2034 12/19/18 0943  HGB 10.4*   < > 10.5*  --  12.2  --   --   --   --   --  9.2*  HCT 34.3  --  35.9*  --  36.0  --   --   --   --   --  31.6*  PLT 239  --  264  --   --   --   --   --   --   --  226  HEPARINUNFRC  --   --   --   --   --    < > 0.15* 0.11*  --  0.31 0.19*  CREATININE 1.12*  --  1.33*  --   --   --   --   --  1.26*  --  1.00  TROPONINI  --   --   --    < >  --   --  0.21*  --  0.21* 0.16*  --    < > = values in this interval not displayed.    Estimated Creatinine Clearance: 94.5 mL/min (by C-G formula based on SCr of 1 mg/dL).   Medical History: Past Medical History:  Diagnosis Date  . Condyloma acuminata   . HTN in pregnancy, chronic 09/24/2015   [place] labs: [x ]Cr, [x]  24hr protein (208) P:Cr 92 Plat: 207 AST/ALT: 10/10    . Hypertension   . Obesity in pregnancy    Antepartum, first trimester  . URI (upper respiratory infection)     Medications:  See medication history  Assessment: 33 yo female on IV heparin infusion for acute right middle and right lower lobe segmental pulmonary emboli .  No evidence of right heart strain.  She was not on anticoagulation PTA. Heparin level has decreased from 0.31 last night  to 0.19 this AM despite heparin rate increased to 1700 units/hr. No infusion issues per nursing.  Still having small episodes of hemoptysis.  PCCM noted hemoptysis likely 2/2 PE and/or pulm edema in setting of AC.  Urine pinkish/red per  RN.  MD aware and heparin continuing for acute PE. Hgb 10.5>12.2>9.2, pltc wnl    Goal of Therapy:  Heparin level 0.3-0.7 units/ml Monitor platelets by anticoagulation protocol: Yes   Plan:  Will increase to 1900 units/hr to keep in goal range Monitor daily HL, CBC, and for s/sx of bleeding F/u plan for oral anticoag at time of discharge  Nicole Cella, [place] Pharmacist  Phone: [phone] Please check AMION for all [place] numbers 12/19/2018 11:53 AM

## 2018-12-19 NOTE — Progress Notes (Signed)
[place] for heparin Indication: pulmonary embolus  No Known Allergies  Patient Measurements: Height: 5' 3.5" (161.3 cm) Weight: 227 lb 4.7 oz (103.1 kg) IBW/kg (Calculated) : 53.55 Heparin Dosing Weight: 78.1  Vital Signs: Temp: 100 F (37.8 C) (03/08 1716) Temp Source: Oral (03/08 1716) BP: 106/75 (03/08 1800) Pulse Rate: 100 (03/08 1800)  Labs: Recent Labs    12/17/18 2014  12/18/18 0103 12/18/18 0858  12/18/18 1431 12/18/18 2034 12/19/18 0943 12/19/18 1854  HGB 10.5*  --  12.2  --   --   --   --  9.2* 9.3*  HCT 35.9*  --  36.0  --   --   --   --  31.6* 31.4*  PLT 264  --   --   --   --   --   --  226 218  HEPARINUNFRC  --   --   --  0.15*   < >  --  0.31 0.19* 0.36  CREATININE 1.33*  --   --   --   --  1.26*  --  1.00  --   TROPONINI  --    < >  --  0.21*  --  0.21* 0.16*  --   --    < > = values in this interval not displayed.    Estimated Creatinine Clearance: 94.5 mL/min (by C-G formula based on SCr of 1 mg/dL).  Assessment: 33 yo female on IV heparin infusion for acute right middle and right lower lobe segmental pulmonary emboli .  No evidence of right heart strain.  She was not on anticoagulation PTA.  Still having small episodes of hemoptysis.  PCCM noted hemoptysis likely 2/2 PE and/or pulm edema in setting of AC.  Urine pinkish/red per RN.  MD aware and heparin continuing for acute PE.  Hep lvl within goal - 0.36  Goal of Therapy:  Heparin level 0.3-0.7 units/ml Monitor platelets by anticoagulation protocol: Yes   Plan:  Continue heparin 1900 units/hr Monitor daily HL, CBC, and for s/sx of bleeding F/u plan for oral anticoag at time of discharge  Levester Fresh, PharmD, BCPS, BCCCP Clinical Pharmacist [phone]  Please check AMION for all [place] numbers  12/19/2018 7:34 PM

## 2018-12-19 NOTE — Progress Notes (Addendum)
PROGRESS NOTE   [name] [name]  OFB:[id number]    DOB: 1986/06/14    DOA: 12/17/2018  PCP: Fanny Bien, MD   I have briefly reviewed patients previous medical records in [facility].  Brief Narrative:  33 year old female with PMH of pregnancy-induced hypertension, morbid obesity, diastolic dysfunction, hospitalized end of December 2019 in [facility] for possible pneumonia and "fluid around my heart", seen by her OP cardiologist on 3/5/202 for lower extremity edema, abdominal distention, cough productive of blood-tinged sputum in the context of running out of her lisinopril and amlodipine for 2 weeks, assessed to have unspecified heart failure, chlorthalidone was switched to Lasix 40 mg daily, carvedilol dose was increased, continued lisinopril, amlodipine and Aldactone, supposed to recheck echo and close follow-up on 3/13, presented to [facility] ED the next day 3/6 due to right-sided pleuritic chest pain, ongoing cough with mild hemoptysis and dyspnea.  Admitted for acute pulmonary embolism without right heart strain, right middle and lower lobe pulmonary infarction versus HCAP (less likely), acute on chronic CHF (suspect systolic) and acute hypoxic respiratory failure.  PCCM and cardiology consulted.  TTE shows new low LVEF 20-25%.   Assessment & Plan:   Active Problems:   HCAP (healthcare-associated pneumonia)   Hemoptysis   Pulmonary emboli (HCC)   Chronic diastolic CHF (congestive heart failure) (HCC)   Elevated troponin   1. Acute segmental pulmonary embolism of RML and RLL: Noted on CTA chest.  No right heart strain.  Risk factors: Morbid obesity, Nexplanon implant/hormonal contraception. LEV dopplers: Neg for DVT. Continue IV heparin drip until stabilizes and then transition to oral agent.  Outpatient discontinuation of hormonal implant.  Duration of anticoagulation: At least 3 months.  Hemodynamically stable. 2. RML and RLL pulmonary infarction versus HCAP: CTA chest indicates  that the airspace opacities are most consistent with pneumonia and atypical for infarcts.  However patient does not have any other clinical features suggestive of pneumonia (no productive cough, fever, sick contacts).  Suspect this is likely pulmonary infarction and less likely to be pneumonia. Hospitalized <90 days in [facility] for pneumonia and possible CHF.  I discussed with PCCM, not entirely convinced that she has a pneumonia, doing well, transitioned to IV ceftriaxone and azithromycin and complete total 5 days course. 3. Acute systolic CHF/nonischemic cardiomyopathy: TTE in 2016: LVEF 60-65%.  Repeat TTE shows LVEF 20-25%.  Since admission was on IV Lasix 40 mg daily and had UO 2350 but still significantly volume overloaded.  BNP down.  Cardiology follow-up appreciated, changed IV Lasix to Lasix infusion, added Aldactone 12.5 mg daily, added hydralazine and Imdur for afterload reduction, holding off on beta-blockers due to decompensated CHF.  Suggesting HF team to follow in a.m. 4. Moderate Pericardial Effusion: TTE report as below. No clinical tamponade. Per Cardiology.  5. Acute respiratory failure with hypoxia: Secondary to acute PE, pulmonary infarction versus pneumonia, decompensated CHF.  Treat underlying causes as above.  Oxygen supplementation.  Incentive spirometry.  Chest x-ray personally reviewed: Cardiomegaly, RML and RLL ASD.  Mild pulmonary congestion. 6. Pleurisy/hemoptysis: Hemoptysis is minimal.  Likely due to pulmonary embolism/pulmonary infarction and pulmonary edema.  Treatment as above and monitor closely.  Pain better controlled today. 7. Elevated troponin: Suspect demand ischemia due to above reasons.  Troponin: 0.09 > 0.14 > 0.21.  TTE results as noted below.  Holding beta-blockers due to decompensated CHF but may have to start at low-dose.  Cardiology follow-up appreciated.  Added hydralazine and Imdur for afterload reduction. 8. Hypokalemia:  Replaced. 9. Hypomagnesemia:  Replaced. 10. Acute kidney injury: Resolved.  Monitor closely while on IV Lasix infusion. 11. Iron deficiency anemia: Hemoglobin baseline in the 10 g range.  Anemia panel 08/29/2018: Iron 18, TIBC 447, saturation 4, ferritin 10.  Unclear etiology.  Need to check with her regarding heavy menstrual blood loss.  Once better, start iron supplements.  Follow CBCs.  Pregnancy test negative.  Stable. 12. Essential hypertension: Mildly uncontrolled.  Holding beta-blockers and lisinopril.  Titrating hydralazine and Imdur. 13. Morbid obesity/Body mass index is 39.63 kg/m.:  Needs to diet, exercise and weight loss when able. 14. Abnormal LFTs: Hyperbilirubinemia and mild transaminitis.?  Passive hepatic congestion.  No GI symptoms.  Follow LFTs.    DVT prophylaxis: Currently on IV heparin drip Code Status: Full Family Communication: None at bedside Disposition: DC home pending clinical improvement, may take several days.   Consultants:  PCCM Cardiology  Procedures:  TTE  IMPRESSIONS    1. The left ventricle has severely reduced systolic function, with an ejection fraction of 20-25%. The cavity size was mild to moderately dilated. There is mild concentric left ventricular hypertrophy. Left ventricular diastolic Doppler parameters are  consistent with pseudonormalization Elevated left ventricular end-diastolic pressure.  2. The right ventricle has moderately reduced systolic function. The cavity was normal. There is no increase in right ventricular wall thickness.  3. Left atrial size was severely dilated.  4. Moderate pericardial effusion.  5. The pericardial effusion is circumferential.  6. The mitral valve is normal in structure. Mild thickening of the mitral valve leaflet.  7. The tricuspid valve is normal in structure.  8. The aortic valve is normal in structure.  9. The pulmonic valve was normal in structure. 10. When compared to the prior study: When compared to the prior study from  10/11/2015 LVEF has decreased significantly from 60-65% to 20-25% with diffuse hypokinesis and moderatel left ventricular dilatation. RVEF is also moderately decreased.  SUMMARY   When compared to the prior study from 10/11/2015 LVEF has decreased significantly from 60-65% to 20-25% with diffuse hypokinesis and moderatel left ventricular dilatation. RVEF is also moderately decreased.  Antimicrobials:  IV cefepime, vancomycin-discontinued Azithromycin IV ceftriaxone   Subjective: Overall feels much better.  Right-sided pleuritic chest pain down to 2/5 and only on coughing.  Minimal hemoptysis, seems slightly better than yesterday.  Dyspnea improved.  Leg swelling better.  ROS: As above, otherwise negative  Objective:  Vitals:   12/19/18 1100 12/19/18 1154 12/19/18 1200 12/19/18 1300  BP: (!) 132/108 132/83 (!) 126/96 119/84  Pulse: (!) 116 (!) 223 (!) 117 (!) 111  Resp: (!) 29 19 (!) 30 (!) 29  Temp:  99 F (37.2 C)    TempSrc:  Oral    SpO2: 100% 100% 99% 100%  Weight:      Height:        Examination:  General exam: Pleasant young female, moderately built and morbidly obese, sitting up in bed, looks much improved compared to yesterday. Respiratory system: Diminished breath sounds in the bases with occasional basal crackles.  Rest of lung fields clear to auscultation.  No pleural rub.  No increased work of breathing. Cardiovascular system: S1 & S2 heard, RRR. No murmurs, rubs, gallops or clicks.  JVD +.  Bilateral 2+ pitting leg edema.  Telemetry personally reviewed: Sinus tachycardia in the 100s. Gastrointestinal system: Abdomen is nondistended, soft and nontender. No organomegaly or masses felt. Normal bowel sounds heard.  Stable. Central nervous system: Alert and oriented. No  focal neurological deficits.  Stable. Extremities: Symmetric 5 x 5 power. Skin: No rashes, lesions or ulcers Psychiatry: Judgement and insight appear normal. Mood & affect appropriate.     Data  Reviewed: I have personally reviewed following labs and imaging studies  CBC: Recent Labs  Lab 12/16/18 1150 12/17/18 2014 12/18/18 0103 12/19/18 0943  WBC 7.1 13.7*  --  16.0*  HGB 10.4* 10.5* 12.2 9.2*  HCT 34.3 35.9* 36.0 31.6*  MCV 76* 75.9*  --  77.3*  PLT 239 264  --  149   Basic Metabolic Panel: Recent Labs  Lab 12/16/18 1150 12/17/18 2014 12/18/18 0103 12/18/18 0858 12/18/18 1431 12/19/18 0943  NA 138 136 136  --  138 135  K 3.8 3.2* 5.4*  --  3.2* 3.7  CL 104 106  --   --  104 104  CO2 20 23  --   --  25 24  GLUCOSE 79 120*  --   --  90 89  BUN 16 11  --   --  10 10  CREATININE 1.12* 1.33*  --   --  1.26* 1.00  CALCIUM 8.4* 8.2*  --   --  7.9* 8.1*  MG  --   --   --  1.5*  --  2.0  PHOS  --   --   --  3.5  --   --    Liver Function Tests: Recent Labs  Lab 12/18/18 1431 12/19/18 0943  AST 25 20  ALT 58* 46*  ALKPHOS 36* 36*  BILITOT 3.7* 3.4*  PROT 5.2* 5.4*  ALBUMIN 2.3* 2.1*   Coagulation Profile: No results for input(s): INR, PROTIME in the last 168 hours. Cardiac Enzymes: Recent Labs  Lab 12/17/18 2215 12/18/18 0858 12/18/18 1431 12/18/18 2034  TROPONINI 0.14* 0.21* 0.21* 0.16*   HbA1C: No results for input(s): HGBA1C in the last 72 hours. CBG: No results for input(s): GLUCAP in the last 168 hours.  Recent Results (from the past 240 hour(s))  Culture, blood (routine x 2) Call MD if unable to obtain prior to antibiotics being given     Status: None (Preliminary result)   Collection Time: 12/18/18  9:00 AM  Result Value Ref Range Status   Specimen Description BLOOD LEFT HAND  Final   Special Requests   Final    BOTTLES DRAWN AEROBIC AND ANAEROBIC Blood Culture adequate volume   Culture   Final    NO GROWTH < 24 HOURS Performed at [facility], [address], [place], [place] [address]    Report Status PENDING  Incomplete  Culture, blood (routine x 2) Call MD if unable to obtain prior to antibiotics being given     Status:  None (Preliminary result)   Collection Time: 12/18/18  9:50 AM  Result Value Ref Range Status   Specimen Description BLOOD RIGHT HAND  Final   Special Requests   Final    BOTTLES DRAWN AEROBIC ONLY Blood Culture results may not be optimal due to an inadequate volume of blood received in culture bottles   Culture   Final    NO GROWTH < 24 HOURS Performed at [facility], [place] [address], [place], [place] [address]    Report Status PENDING  Incomplete         Radiology Studies: Dg Chest 2 View  Result Date: 12/17/2018 CLINICAL DATA:  Increased swelling. Hemoptysis. Difficulty breathing. Chest pain. EXAM: CHEST - 2 VIEW COMPARISON:  CT of the  abdomen and pelvis 08/29/2018 FINDINGS: Heart is enlarged, stable. Mild pulmonary vascular congestion is present. Right middle lobe pneumonia is present. A right pleural effusion is evident. Minimal atelectasis is present at the left base. IMPRESSION: 1. Right middle lobe pneumonia. 2. Right pleural effusion. 3. Cardiomegaly without failure. Electronically Signed   By: San Morelle M.D.   On: 12/17/2018 20:37   Ct Angio Chest Pe W And/or Wo Contrast  Result Date: 12/17/2018 CLINICAL DATA:  Hemoptysis, shortness of breath for 2 days. EXAM: CT ANGIOGRAPHY CHEST WITH CONTRAST TECHNIQUE: Multidetector CT imaging of the chest was performed using the standard protocol during bolus administration of intravenous contrast. Multiplanar CT image reconstructions and MIPs were obtained to evaluate the vascular anatomy. CONTRAST:  3mL OMNIPAQUE IOHEXOL 350 MG/ML SOLN COMPARISON:  Chest radiograph December 17, 2018 FINDINGS: CARDIOVASCULAR: Adequate contrast opacification of the pulmonary artery's. Main pulmonary artery is not enlarged. Segmental occlusive pulmonary arterial filling defects and RIGHT middle lobe, and RIGHT lower lobe. Moderate cardiomegaly. No RIGHT heart strain (RV/LV equals 0.8) dilated RIGHT atrium with reflux contrast into the intrahepatic  IVC. Moderate pericardial effusion. Thoracic aorta is normal course and caliber. MEDIASTINUM/NODES: No lymphadenopathy by CT size criteria. LUNGS/PLEURA: Tracheobronchial tree is patent, no pneumothorax. Confluent ground-glass opacities RIGHT middle and RIGHT lower lobe. LEFT lower lobe atelectasis. Small RIGHT pleural effusion. UPPER ABDOMEN: Non-acute. Contrast versus calcifications LEFT kidney. MUSCULOSKELETAL: Non-acute. Mild broad levoscoliosis on this nonweightbearing examination. Review of the MIP images confirms the above findings. IMPRESSION: 1. Acute segmental RIGHT middle and RIGHT lower lobe occlusive pulmonary emboli. No RIGHT heart strain. 2. Confluent RIGHT middle RIGHT lower lobe airspace opacities most consistent with pneumonia, atypical appearance for infarcts. Small RIGHT pleural effusion. 3. Critical Value/emergent results were called by telephone at the time of interpretation on 12/17/2018 at 11:05 pm to Dr. Lajean Saver , who verbally acknowledged these results. Electronically Signed   By: Elon Alas M.D.   On: 12/17/2018 23:06   Dg Chest Port 1 View  Result Date: 12/19/2018 CLINICAL DATA:  Right chest pain and hemoptysis since Wednesday. Pneumonia. EXAM: PORTABLE CHEST 1 VIEW COMPARISON:  Plain film and CT of 12/17/2018 FINDINGS: Marked cardiomegaly. Small right pleural effusion is similar. No pneumothorax. Right mid and lower lung airspace disease is not significantly changed. The left lung remains clear. No overt congestive failure. Low lung volumes with resultant pulmonary interstitial prominence. IMPRESSION: No significant change in right mid and lower lung airspace disease. Cardiomegaly and low lung volumes, without congestive failure. Electronically Signed   By: Abigail Miyamoto M.D.   On: 12/19/2018 09:32   Vas [place] Lower Extremity Venous (dvt)  Result Date: 12/18/2018  Lower Venous Study Indications: Pulmonary embolism, and hormone contraception use.  Performing Technologist:  Maudry Mayhew MHA, RDMS, RVT, RDCS  Examination Guidelines: A complete evaluation includes B-mode imaging, spectral Doppler, color Doppler, and power Doppler as needed of all accessible portions of each vessel. Bilateral testing is considered an integral part of a complete examination. Limited examinations for reoccurring indications may be performed as noted.  Right Venous Findings: +---------+---------------+---------+-----------+----------+-------+          CompressibilityPhasicitySpontaneityPropertiesSummary +---------+---------------+---------+-----------+----------+-------+ CFV      Full           Yes      Yes                          +---------+---------------+---------+-----------+----------+-------+ SFJ      Full                                                 +---------+---------------+---------+-----------+----------+-------+  FV Prox  Full                                                 +---------+---------------+---------+-----------+----------+-------+ FV Mid   Full                                                 +---------+---------------+---------+-----------+----------+-------+ FV DistalFull                                                 +---------+---------------+---------+-----------+----------+-------+ POP      Full           Yes      Yes                          +---------+---------------+---------+-----------+----------+-------+ PTV      Full                                                 +---------+---------------+---------+-----------+----------+-------+ PERO     Full                                                 +---------+---------------+---------+-----------+----------+-------+  Left Venous Findings: +---------+---------------+---------+-----------+----------+--------------+          CompressibilityPhasicitySpontaneityPropertiesSummary         +---------+---------------+---------+-----------+----------+--------------+ CFV      Full           Yes      Yes                                 +---------+---------------+---------+-----------+----------+--------------+ SFJ      Full                                                        +---------+---------------+---------+-----------+----------+--------------+ FV Prox  Full                                                        +---------+---------------+---------+-----------+----------+--------------+ FV Mid   Full                                                        +---------+---------------+---------+-----------+----------+--------------+ FV Distal  Not visualized +---------+---------------+---------+-----------+----------+--------------+ POP      Full           Yes      Yes                                 +---------+---------------+---------+-----------+----------+--------------+ PTV      Full                                                        +---------+---------------+---------+-----------+----------+--------------+ PERO     Full                                                        +---------+---------------+---------+-----------+----------+--------------+    Summary: Right: There is no evidence of deep vein thrombosis in the lower extremity. No cystic structure found in the popliteal fossa. Left: There is no evidence of deep vein thrombosis in the lower extremity. However, portions of this examination were limited- see technologist comments above. No cystic structure found in the popliteal fossa.  *See table(s) above for measurements and observations. Electronically signed by Monica Martinez MD on 12/18/2018 at 4:28:41 PM.    Final         Scheduled Meds: . hydrALAZINE  20 mg Oral Q8H  . isosorbide mononitrate  30 mg Oral Daily  . potassium chloride  20 mEq Oral Daily  . sodium chloride  flush  3 mL Intravenous Once  . sodium chloride flush  3 mL Intravenous Q12H  . spironolactone  12.5 mg Oral Daily   Continuous Infusions: . sodium chloride    . azithromycin Stopped (12/19/18 0052)  . ceFEPime (MAXIPIME) IV Stopped (12/19/18 8850)  . furosemide (LASIX) infusion 4 mg/hr (12/19/18 1151)  . heparin 1,900 Units/hr (12/19/18 1206)  . vancomycin 750 mg (12/19/18 0951)     LOS: 2 days     Vernell Leep, MD, FACP, [facility]. [facility]  To contact the attending provider between 7A-7P or the covering provider during after hours 7P-7A, please log into the web site [email or web address] and access using universal [place] password for that web site. If you do not have the password, please call the hospital operator.  12/19/2018, 1:17 PM

## 2018-12-19 NOTE — Progress Notes (Signed)
Progress Note  Patient Name: [name] [name] Date of Encounter: 12/19/2018  Primary Cardiologist: Dr. Dorris Carnes  Subjective   States that her breathing feels somewhat better, but she is still having small episodes of hemoptysis.  No chest pain.  No abdominal pain.  Inpatient Medications    Scheduled Meds: . furosemide  40 mg Intravenous Daily  . hydrALAZINE  10 mg Oral Q8H  . isosorbide mononitrate  30 mg Oral Daily  . sodium chloride flush  3 mL Intravenous Once  . sodium chloride flush  3 mL Intravenous Q12H   Continuous Infusions: . sodium chloride    . azithromycin Stopped (12/19/18 0052)  . ceFEPime (MAXIPIME) IV Stopped (12/19/18 8185)  . heparin 1,700 Units/hr (12/19/18 0950)  . vancomycin 750 mg (12/19/18 0951)   PRN Meds: sodium chloride, acetaminophen, albuterol, hydrALAZINE, morphine injection, ondansetron **OR** ondansetron (ZOFRAN) IV, oxyCODONE, sodium chloride flush   Vital Signs    Vitals:   12/19/18 0700 12/19/18 0727 12/19/18 0800 12/19/18 0900  BP: (!) 136/95 (!) 136/95 (!) 151/92 (!) 133/95  Pulse: (!) 103 (!) 109 (!) 110 (!) 113  Resp: (!) 28 (!) 22 (!) 26 (!) 26  Temp:  99.7 F (37.6 C)    TempSrc:  Oral    SpO2: 100% 100% 100% 100%  Weight:      Height:        Intake/Output Summary (Last 24 hours) at 12/19/2018 1011 Last data filed at 12/19/2018 0700 Gross per 24 hour  Intake 2038.24 ml  Output 2350 ml  Net -311.76 ml   Filed Weights   12/18/18 1627  Weight: 103.1 kg    Telemetry    Sinus rhythm and sinus tachycardia.  Personally reviewed.  ECG    Tracing from 12/17/2018 shows sinus tachycardia with biatrial enlargement and nonspecific ST-T changes.  Personally reviewed.  Physical Exam   GEN:  Obese woman.  No acute distress.   Neck:  Elevated JVD. Cardiac: RRR, probable soft S3.  Respiratory:  Scattered rhonchi and egophony mid right lung zone. GI: Soft, nontender, bowel sounds present. MS:  2+ lower leg edema; No  deformity. Neuro:  Nonfocal. Psych: Alert and oriented x 3. Normal affect.  Labs    Chemistry Recent Labs  Lab 12/16/18 1150 12/17/18 2014 12/18/18 0103 12/18/18 1431  NA 138 136 136 138  K 3.8 3.2* 5.4* 3.2*  CL 104 106  --  104  CO2 20 23  --  25  GLUCOSE 79 120*  --  90  BUN 16 11  --  10  CREATININE 1.12* 1.33*  --  1.26*  CALCIUM 8.4* 8.2*  --  7.9*  PROT  --   --   --  5.2*  ALBUMIN  --   --   --  2.3*  AST  --   --   --  25  ALT  --   --   --  58*  ALKPHOS  --   --   --  36*  BILITOT  --   --   --  3.7*  GFRNONAA 66 53*  --  57*  GFRAA 76 >60  --  >60  ANIONGAP  --  7  --  9     Hematology Recent Labs  Lab 12/16/18 1150 12/17/18 2014 12/18/18 0103  WBC 7.1 13.7*  --   RBC 4.49 4.73  --   HGB 10.4* 10.5* 12.2  HCT 34.3 35.9* 36.0  MCV 76* 75.9*  --  MCH 23.2* 22.2*  --   MCHC 30.3* 29.2*  --   RDW 15.9* 16.3*  --   PLT 239 264  --     Cardiac Enzymes Recent Labs  Lab 12/17/18 2215 12/18/18 0858 12/18/18 1431 12/18/18 2034  TROPONINI 0.14* 0.21* 0.21* 0.16*    Recent Labs  Lab 12/17/18 2020  TROPIPOC 0.09*     BNP Recent Labs  Lab 12/16/18 1150 12/17/18 2014  BNP  --  836.8*  PROBNP 3,613*  --      Radiology    Dg Chest 2 View  Result Date: 12/17/2018 CLINICAL DATA:  Increased swelling. Hemoptysis. Difficulty breathing. Chest pain. EXAM: CHEST - 2 VIEW COMPARISON:  CT of the abdomen and pelvis 08/29/2018 FINDINGS: Heart is enlarged, stable. Mild pulmonary vascular congestion is present. Right middle lobe pneumonia is present. A right pleural effusion is evident. Minimal atelectasis is present at the left base. IMPRESSION: 1. Right middle lobe pneumonia. 2. Right pleural effusion. 3. Cardiomegaly without failure. Electronically Signed   By: San Morelle M.D.   On: 12/17/2018 20:37   Ct Angio Chest Pe W And/or Wo Contrast  Result Date: 12/17/2018 CLINICAL DATA:  Hemoptysis, shortness of breath for 2 days. EXAM: CT ANGIOGRAPHY  CHEST WITH CONTRAST TECHNIQUE: Multidetector CT imaging of the chest was performed using the standard protocol during bolus administration of intravenous contrast. Multiplanar CT image reconstructions and MIPs were obtained to evaluate the vascular anatomy. CONTRAST:  74mL OMNIPAQUE IOHEXOL 350 MG/ML SOLN COMPARISON:  Chest radiograph December 17, 2018 FINDINGS: CARDIOVASCULAR: Adequate contrast opacification of the pulmonary artery's. Main pulmonary artery is not enlarged. Segmental occlusive pulmonary arterial filling defects and RIGHT middle lobe, and RIGHT lower lobe. Moderate cardiomegaly. No RIGHT heart strain (RV/LV equals 0.8) dilated RIGHT atrium with reflux contrast into the intrahepatic IVC. Moderate pericardial effusion. Thoracic aorta is normal course and caliber. MEDIASTINUM/NODES: No lymphadenopathy by CT size criteria. LUNGS/PLEURA: Tracheobronchial tree is patent, no pneumothorax. Confluent ground-glass opacities RIGHT middle and RIGHT lower lobe. LEFT lower lobe atelectasis. Small RIGHT pleural effusion. UPPER ABDOMEN: Non-acute. Contrast versus calcifications LEFT kidney. MUSCULOSKELETAL: Non-acute. Mild broad levoscoliosis on this nonweightbearing examination. Review of the MIP images confirms the above findings. IMPRESSION: 1. Acute segmental RIGHT middle and RIGHT lower lobe occlusive pulmonary emboli. No RIGHT heart strain. 2. Confluent RIGHT middle RIGHT lower lobe airspace opacities most consistent with pneumonia, atypical appearance for infarcts. Small RIGHT pleural effusion. 3. Critical Value/emergent results were called by telephone at the time of interpretation on 12/17/2018 at 11:05 pm to Dr. Lajean Saver , who verbally acknowledged these results. Electronically Signed   By: Elon Alas M.D.   On: 12/17/2018 23:06   Dg Chest Port 1 View  Result Date: 12/19/2018 CLINICAL DATA:  Right chest pain and hemoptysis since Wednesday. Pneumonia. EXAM: PORTABLE CHEST 1 VIEW COMPARISON:  Plain  film and CT of 12/17/2018 FINDINGS: Marked cardiomegaly. Small right pleural effusion is similar. No pneumothorax. Right mid and lower lung airspace disease is not significantly changed. The left lung remains clear. No overt congestive failure. Low lung volumes with resultant pulmonary interstitial prominence. IMPRESSION: No significant change in right mid and lower lung airspace disease. Cardiomegaly and low lung volumes, without congestive failure. Electronically Signed   By: Abigail Miyamoto M.D.   On: 12/19/2018 09:32   Vas [place] Lower Extremity Venous (dvt)  Result Date: 12/18/2018  Lower Venous Study Indications: Pulmonary embolism, and hormone contraception use.  Performing Technologist: Maudry Mayhew MHA, RDMS, RVT, RDCS  Examination Guidelines: A complete evaluation includes B-mode imaging, spectral Doppler, color Doppler, and power Doppler as needed of all accessible portions of each vessel. Bilateral testing is considered an integral part of a complete examination. Limited examinations for reoccurring indications may be performed as noted.  Right Venous Findings: +---------+---------------+---------+-----------+----------+-------+          CompressibilityPhasicitySpontaneityPropertiesSummary +---------+---------------+---------+-----------+----------+-------+ CFV      Full           Yes      Yes                          +---------+---------------+---------+-----------+----------+-------+ SFJ      Full                                                 +---------+---------------+---------+-----------+----------+-------+ FV Prox  Full                                                 +---------+---------------+---------+-----------+----------+-------+ FV Mid   Full                                                 +---------+---------------+---------+-----------+----------+-------+ FV DistalFull                                                  +---------+---------------+---------+-----------+----------+-------+ POP      Full           Yes      Yes                          +---------+---------------+---------+-----------+----------+-------+ PTV      Full                                                 +---------+---------------+---------+-----------+----------+-------+ PERO     Full                                                 +---------+---------------+---------+-----------+----------+-------+  Left Venous Findings: +---------+---------------+---------+-----------+----------+--------------+          CompressibilityPhasicitySpontaneityPropertiesSummary        +---------+---------------+---------+-----------+----------+--------------+ CFV      Full           Yes      Yes                                 +---------+---------------+---------+-----------+----------+--------------+ SFJ      Full                                                        +---------+---------------+---------+-----------+----------+--------------+  FV Prox  Full                                                        +---------+---------------+---------+-----------+----------+--------------+ FV Mid   Full                                                        +---------+---------------+---------+-----------+----------+--------------+ FV Distal                                             Not visualized +---------+---------------+---------+-----------+----------+--------------+ POP      Full           Yes      Yes                                 +---------+---------------+---------+-----------+----------+--------------+ PTV      Full                                                        +---------+---------------+---------+-----------+----------+--------------+ PERO     Full                                                        +---------+---------------+---------+-----------+----------+--------------+     Summary: Right: There is no evidence of deep vein thrombosis in the lower extremity. No cystic structure found in the popliteal fossa. Left: There is no evidence of deep vein thrombosis in the lower extremity. However, portions of this examination were limited- see technologist comments above. No cystic structure found in the popliteal fossa.  *See table(s) above for measurements and observations. Electronically signed by Monica Martinez MD on 12/18/2018 at 4:28:41 PM.    Final     Cardiac Studies   Echocardiogram 12/18/2018:  1. The left ventricle has severely reduced systolic function, with an ejection fraction of 20-25%. The cavity size was mild to moderately dilated. There is mild concentric left ventricular hypertrophy. Left ventricular diastolic Doppler parameters are  consistent with pseudonormalization Elevated left ventricular end-diastolic pressure.  2. The right ventricle has moderately reduced systolic function. The cavity was normal. There is no increase in right ventricular wall thickness.  3. Left atrial size was severely dilated.  4. Moderate pericardial effusion.  5. The pericardial effusion is circumferential.  6. The mitral valve is normal in structure. Mild thickening of the mitral valve leaflet.  7. The tricuspid valve is normal in structure.  8. The aortic valve is normal in structure.  9. The pulmonic valve was normal in structure. 10. When compared to the prior study: When compared to the prior study from 10/11/2015 LVEF has decreased significantly from 60-65% to 20-25% with diffuse  hypokinesis and moderatel left ventricular dilatation. RVEF is also moderately decreased.  SUMMARY   When compared to the prior study from 10/11/2015 LVEF has decreased significantly from 60-65% to 20-25% with diffuse hypokinesis and moderatel left ventricular dilatation. RVEF is also moderately decreased.  Patient Profile     33 y.o. female with a history of pregnancy-induced  hypertension, reported weight gain over several weeks, and recent worsening shortness of breath with hemoptysis.  She has been diagnosed with acute segmental pulmonary emboli involving the right middle and lower lobes, also possible right middle lobe pneumonia, and acute systolic heart failure with LVEF newly documented at 20 to 25% from normal previously.  She also has moderately reduced right ventricular contraction.  Assessment & Plan    1.  Acute systolic heart failure with LVEF 20 to 25% and diffuse hypokinesis possibly reflecting nonischemic cardiomyopathy, also moderate right ventricular dysfunction.  She has had progressive weight gain over several weeks and presents with volume overload.  2.  Acute segmental pulmonary emboli involving the right middle and lower lobes.  3.  Right middle lobe infiltrate, suspected pneumonia,currently on antibiotics.  4.  Mild troponin I elevation in relatively flat pattern most suggestive of demand ischemia.  5.  Essential hypertension.  6.  Moderate sized circumferential pericardial effusion.  Plan to increase hydralazine and continue Imdur for afterload reduction.  Change from divided dose Lasix to Lasix infusion and supplement potassium.  Also start Aldactone.  Would hold off on beta-blocker given current physiologic demands and heart failure with fluid overload.  Suggest consultation with the Heart Failure team tomorrow.  Signed, Rozann Lesches, MD  12/19/2018, 10:11 AM

## 2018-12-19 NOTE — Progress Notes (Signed)
Addendum  RN paged ~ 3:15pm d/t patient having SVT in 180's with some CP. Arrived at bedside & joined Cardiologist who was already there. Patient with unchanged right-sided pleuritic type of chest pain that she has had since admission.  Now with palpitations and high fever of 103.1.  Patient appears anxious, mildly tachypneic and in some painful distress.  Temperature 102.7.  Heart rate fluctuating in the 160s-180s, SVT on monitor, sustained.  Blood pressure okay.  RS: Reduced breath sounds in the bases, right>left with few basal crackles.  No increased work of breathing.  Rest of lung fields clear to auscultation.  A/P  1.  SVT versus atrial flutter with RVR: Failed vagal maneuver.  Patient did not respond to adenosine 6 mg x 1 dose.  Tried Adenosine 12 mg x 1 dose with transient slowing of heart rate but then back up to the 160s- 180s.  Subsequently spontaneously reverted to sinus tachycardia in the 130s.  Plan is to start IV amiodarone, transferred to ICU for close monitoring. 2.  Menstrual bleeding versus hematuria: Patient has a pure wick and note dark-colored urine and collection bottle.  This may be from menstrual bleeding which started on day of admission.  Patient has history of irregular menstrual bleeding.  Note that she is on IV heparin.  Thereby will monitor CBC later today to ensure stability and then daily.  I attempted to reach patient's spouse to update him regarding patient's decline in status, plans to transfer her to ICU for close monitoring.  However I was unable to reach him and he does not have a voicemail set up to leave messages.  I consulted Dr. Foye Clock, PCCM.  Discussed with Dr. Lyman Bishop, Cardiology at bedside.  Vernell Leep, MD, FACP, [facility]. [facility]  To contact the attending provider between 7A-7P or the covering provider during after hours 7P-7A, please log into the web site [email or web address] and access using universal [employer] password for that  web site. If you do not have the password, please call the hospital operator.

## 2018-12-19 NOTE — Progress Notes (Signed)
PT Cancellation Note  Patient Details Name: [name] [name] MRN: [id number] DOB: 1985/11/13   Cancelled Treatment:    Reason Eval/Treat Not Completed: Patient not medically ready;Medical issues which prohibited therapy patient with SVT in 72s' discussed with MD patient unstable and transferring to ICU. PT will cont to follow for appropriateness.   Reinaldo Berber, PT, DPT Acute Rehabilitation Services Pager: [phone] Office: [phone]    Reinaldo Berber 12/19/2018, 4:43 PM

## 2018-12-20 ENCOUNTER — Inpatient Hospital Stay: Payer: Self-pay

## 2018-12-20 ENCOUNTER — Ambulatory Visit: Payer: 59 | Admitting: Dietician

## 2018-12-20 ENCOUNTER — Other Ambulatory Visit (HOSPITAL_COMMUNITY): Payer: 59

## 2018-12-20 DIAGNOSIS — I5041 Acute combined systolic (congestive) and diastolic (congestive) heart failure: Secondary | ICD-10-CM

## 2018-12-20 DIAGNOSIS — R05 Cough: Secondary | ICD-10-CM

## 2018-12-20 DIAGNOSIS — I5043 Acute on chronic combined systolic (congestive) and diastolic (congestive) heart failure: Secondary | ICD-10-CM

## 2018-12-20 DIAGNOSIS — I480 Paroxysmal atrial fibrillation: Secondary | ICD-10-CM

## 2018-12-20 DIAGNOSIS — J9601 Acute respiratory failure with hypoxia: Secondary | ICD-10-CM

## 2018-12-20 LAB — HEPATIC FUNCTION PANEL
ALT: 39 U/L (ref 0–44)
AST: 23 U/L (ref 15–41)
Albumin: 2.1 g/dL — ABNORMAL LOW (ref 3.5–5.0)
Alkaline Phosphatase: 36 U/L — ABNORMAL LOW (ref 38–126)
Bilirubin, Direct: 0.7 mg/dL — ABNORMAL HIGH (ref 0.0–0.2)
Indirect Bilirubin: 1.1 mg/dL — ABNORMAL HIGH (ref 0.3–0.9)
Total Bilirubin: 1.8 mg/dL — ABNORMAL HIGH (ref 0.3–1.2)
Total Protein: 5.5 g/dL — ABNORMAL LOW (ref 6.5–8.1)

## 2018-12-20 LAB — ANA W/REFLEX IF POSITIVE: ANA: NEGATIVE

## 2018-12-20 LAB — STREP PNEUMONIAE URINARY ANTIGEN: Strep Pneumo Urinary Antigen: NEGATIVE

## 2018-12-20 LAB — BASIC METABOLIC PANEL
Anion gap: 10 (ref 5–15)
BUN: 9 mg/dL (ref 6–20)
CO2: 22 mmol/L (ref 22–32)
Calcium: 7.6 mg/dL — ABNORMAL LOW (ref 8.9–10.3)
Chloride: 102 mmol/L (ref 98–111)
Creatinine, Ser: 1.17 mg/dL — ABNORMAL HIGH (ref 0.44–1.00)
GFR calc Af Amer: >60 mL/min (ref >60)
GFR calc non Af Amer: >60 mL/min (ref >60)
GLUCOSE: 89 mg/dL (ref 70–99)
Potassium: 3.9 mmol/L (ref 3.5–5.1)
Sodium: 134 mmol/L — ABNORMAL LOW (ref 135–145)

## 2018-12-20 LAB — CBC
HCT: 31.2 % — ABNORMAL LOW (ref 36.0–46.0)
HEMATOCRIT: 31.6 % — AB (ref 36.0–46.0)
Hemoglobin: 9.2 g/dL — ABNORMAL LOW (ref 12.0–15.0)
Hemoglobin: 9.4 g/dL — ABNORMAL LOW (ref 12.0–15.0)
MCH: 22.5 pg — ABNORMAL LOW (ref 26.0–34.0)
MCH: 23.2 pg — ABNORMAL LOW (ref 26.0–34.0)
MCHC: 29.1 g/dL — ABNORMAL LOW (ref 30.0–36.0)
MCHC: 30.1 g/dL (ref 30.0–36.0)
MCV: 77.3 fL — ABNORMAL LOW (ref 80.0–100.0)
MCV: 76.8 fL — ABNORMAL LOW (ref 80.0–100.0)
Platelets: 226 10*3/uL (ref 150–400)
Platelets: 206 10*3/uL (ref 150–400)
RBC: 4.09 MIL/uL (ref 3.87–5.11)
RBC: 4.06 MIL/uL (ref 3.87–5.11)
RDW: 16.4 % — ABNORMAL HIGH (ref 11.5–15.5)
RDW: 16.4 % — ABNORMAL HIGH (ref 11.5–15.5)
WBC: 16 10*3/uL — ABNORMAL HIGH (ref 4.0–10.5)
WBC: 12.3 10*3/uL — ABNORMAL HIGH (ref 4.0–10.5)
nRBC: 0 % (ref 0.0–0.2)
nRBC: 0 % (ref 0.0–0.2)

## 2018-12-20 LAB — EXPECTORATED SPUTUM ASSESSMENT W GRAM STAIN, RFLX TO RESP C

## 2018-12-20 LAB — HEPARIN LEVEL (UNFRACTIONATED): Heparin Unfractionated: 0.35 IU/mL (ref 0.30–0.70)

## 2018-12-20 MED ORDER — SODIUM CHLORIDE 0.9 % IV SOLN
500.0000 mg | Freq: Every day | INTRAVENOUS | Status: AC
  Administered 2018-12-20 – 2018-12-22 (×3): 500 mg via INTRAVENOUS
  Filled 2018-12-20 (×3): qty 500

## 2018-12-20 MED ORDER — CARVEDILOL 3.125 MG PO TABS
3.1250 mg | ORAL_TABLET | Freq: Two times a day (BID) | ORAL | Status: DC
  Administered 2018-12-20 – 2018-12-21 (×3): 3.125 mg via ORAL
  Filled 2018-12-20 (×3): qty 1

## 2018-12-20 MED ORDER — SODIUM CHLORIDE 0.9% FLUSH
10.0000 mL | Freq: Two times a day (BID) | INTRAVENOUS | Status: DC
  Administered 2018-12-20 – 2018-12-22 (×4): 10 mL
  Administered 2018-12-22: 20 mL
  Administered 2018-12-23 – 2018-12-25 (×4): 10 mL
  Administered 2018-12-25: 20 mL
  Administered 2018-12-26 – 2018-12-27 (×3): 10 mL

## 2018-12-20 MED ORDER — METOLAZONE 2.5 MG PO TABS
2.5000 mg | ORAL_TABLET | Freq: Once | ORAL | Status: AC
  Administered 2018-12-20: 2.5 mg via ORAL
  Filled 2018-12-20: qty 1

## 2018-12-20 MED ORDER — SODIUM CHLORIDE 0.9% FLUSH: 10.0000 mL | INTRAVENOUS | Status: DC | PRN

## 2018-12-20 MED ORDER — GUAIFENESIN-CODEINE 100-10 MG/5ML PO SOLN
10.0000 mL | Freq: Four times a day (QID) | ORAL | Status: DC | PRN
  Administered 2018-12-20: 10 mL via ORAL
  Filled 2018-12-20 (×3): qty 10

## 2018-12-20 MED ORDER — SPIRONOLACTONE 25 MG PO TABS
25.0000 mg | ORAL_TABLET | Freq: Every day | ORAL | Status: DC
  Administered 2018-12-21 – 2018-12-22 (×2): 25 mg via ORAL
  Filled 2018-12-20 (×3): qty 1

## 2018-12-20 NOTE — Care Management (Signed)
Benefits check sent and pending for Eliquis and Xarelto as requested.  Midge Minium RN, BSN, NCM-BC, ACM-RN [phone]

## 2018-12-20 NOTE — Consult Note (Addendum)
Advanced Heart Failure Team Consult Note   Primary Physician: Fanny Bien, MD PCP-Cardiologist:  Dorris Carnes, MD  Reason for Consultation: Acute systolic CHF  HPI:    [name] [name] is seen today for evaluation of A/C systolic CHF at the request of Dr. Margaretann Loveless.  [name] [name] is a 33 y.o. female with h/o pregnancy induced HTN and suspected diastolic CHF.   Last seen by [facility] 12/16/18. Had run out of lisinopril and amlodipine for an unspecified time frame. Had been eating a lot of salty foods and not exercising. Coreg increased and clorthalidone switched to lasix.   She presented to [facility] 12/17/18 with worsening SOB, abdominal distention, peripheral edema, and hemoptysis. CXR concerning for R middle lobe PNA and R pleural effusion. Chest CTA demonstrated acute segmental right middle and right lower lobe pulmonary emboli without obvious right heart strain. Started on IV heparin.  She reports weight gain of ~ 30 lbs. Reports being seen in the hospital at [facility] in 09/2018.  Pertinent labs on admission include K 3.2, Cr 1.33, BNP 836.8, Mild LFT elevated. Troponine 0.21 -> 0.16. Viral panel negative. Flu panel negative.   She is currently diuresing on IV lasix gtt at 4 mg/hr. Weight actually shows up 6 lbs, but checking intermittently.   Not feeling much better. She has been acutely more SOB over the past week, with the fluid building up in legs and abdomen since last Tuesday. She is SOB with any exertion, including ADLs.  She has no personal cardiac history other than HTN. Her mother passed away from a "heart attack" at 32 yo. She snores heavily and has questionable apneic episodes per her husband.   Tmax 103.1. WBC 12.3.  Echo 12/18/18 LVEF 20-25%, Severe LAE, Moderately reduced RV, Mod pericardial effusion (circumferential).  - Note - This is a significant decrease in EF from 60-65% in 09/2015.  Chest CTA 12/17/18 1. Acute segmental RIGHT middle and RIGHT lower lobe occlusive  pulmonary emboli. No RIGHT heart strain. 2. Confluent RIGHT middle RIGHT lower lobe airspace opacities most consistent with pneumonia, atypical appearance for infarcts. Small RIGHT pleural effusion.  Review of systems complete and found to be negative unless listed in HPI.    Home Medications Prior to Admission medications   Medication Sig Start Date End Date Taking? Authorizing Provider  amLODipine (NORVASC) 10 MG tablet Take 1 tablet (10 mg total) by mouth daily. 12/16/18  Yes Daune Perch, NP  carvedilol (COREG) 25 MG tablet Take 1 tablet (25 mg total) by mouth 2 (two) times daily. 12/16/18  Yes Daune Perch, NP  colchicine 0.6 MG tablet Take 0.6 mg by mouth daily.   Yes [provider]  ferrous sulfate 325 (65 FE) MG tablet Take 1 tablet (325 mg total) by mouth daily with breakfast. 08/31/18  Yes Seawell, Jaimie A, DO  furosemide (LASIX) 40 MG tablet Take 1 tablet (40 mg total) by mouth daily. 12/16/18  Yes Daune Perch, NP  lisinopril (PRINIVIL,ZESTRIL) 40 MG tablet Take 1 tablet (40 mg total) by mouth daily. 12/16/18  Yes Daune Perch, NP  spironolactone (ALDACTONE) 25 MG tablet Take 25 mg by mouth daily.   Yes [provider]  etonogestrel (NEXPLANON) 68 MG IMPL implant 1 each by Subdermal route once.    [provider]    Past Medical History: Past Medical History:  Diagnosis Date  . Condyloma acuminata   . HTN in pregnancy, chronic 09/24/2015   [place] labs: [x ]Cr, [x]  24hr protein (208)  P:Cr 92 Plat: 207 AST/ALT: 10/10    . Hypertension   . Obesity in pregnancy    Antepartum, first trimester  . URI (upper respiratory infection)     Past Surgical History: Past Surgical History:  Procedure Laterality Date  . BIRTH CONTROL IMPLANT Left 03/2016   "Nexplanon in my upper arm"  . CESAREAN SECTION N/A 02/25/2016   Procedure: CESAREAN SECTION;  Surgeon: Truett Mainland, DO;  Location: [place];  Service: Obstetrics;  Laterality: N/A;     Family History: Family History  Problem Relation Age of Onset  . Hypertension Mother   . Heart disease Mother     Social History: Social History   Socioeconomic History  . Marital status: Single    Spouse name: n/a  . Number of children: 0  . Years of education: college  . Highest education level: Not on file  Occupational History  . Occupation: Therapist, art Rep    Employer: [employer]  . Financial resource strain: Not on file  . Food insecurity:    Worry: Not on file    Inability: Not on file  . Transportation needs:    Medical: Not on file    Non-medical: Not on file  Tobacco Use  . Smoking status: Never Smoker  . Smokeless tobacco: Never Used  Substance and Sexual Activity  . Alcohol use: Never    Frequency: Never  . Drug use: Never  . Sexual activity: Yes    Birth control/protection: None  Lifestyle  . Physical activity:    Days per week: Not on file    Minutes per session: Not on file  . Stress: Not on file  Relationships  . Social connections:    Talks on phone: Not on file    Gets together: Not on file    Attends religious service: Not on file    Active member of club or organization: Not on file    Attends meetings of clubs or organizations: Not on file    Relationship status: Not on file  Other Topics Concern  . Not on file  Social History Narrative   Lives alone.  No family lives nearby.   Allergies:  No Known Allergies  Objective:    Vital Signs:   Temp:  [99 F (37.2 C)-103.1 F (39.5 C)] 100.4 F (38 C) (03/09 0700) Pulse Rate:  [48-223] 48 (03/09 0800) Resp:  [0-33] 26 (03/09 0800) BP: (102-140)/(56-108) 120/71 (03/09 0800) SpO2:  [96 %-100 %] 100 % (03/09 0800) Weight:  [105.7 kg] 105.7 kg (03/09 0600) Last BM Date: (PTA)  Weight change: Filed Weights   12/18/18 1627 12/20/18 0600  Weight: 103.1 kg 105.7 kg   Intake/Output:   Intake/Output Summary (Last 24 hours) at 12/20/2018 0906 Last data filed  at 12/20/2018 0828 Gross per 24 hour  Intake 1171.81 ml  Output 700 ml  Net 471.81 ml    Physical Exam    General:  NAD sitting upright in bed.  HEENT: O2 via [place].  Neck: supple. JVP to jaw. Carotids 2+ bilat; no bruits. No lymphadenopathy or thyromegaly appreciated. Cor: PMI nondisplaced. Regular, tachy. No rubs, gallops or murmurs. Lungs: Diminished throughout. Dull R base.  Abdomen: Obese, nontender, nondistended. No hepatosplenomegaly. No bruits or masses. Good bowel sounds. Extremities: no cyanosis, clubbing, or rash. 2-3+ edema into thighs.  Neuro: alert & orientedx3, cranial nerves grossly intact. moves all 4 extremities w/o difficulty. Affect pleasant  Telemetry   Sinus tach 100-110s. Has  intermittently been in Afib/SVT in 120-130s, as high as 170 at one point, personally reviewed.   EKG    12/19/2018 Afib RVR/SVT at 171, personally reviewed.   Labs   Basic Metabolic Panel: Recent Labs  Lab 12/16/18 1150 12/17/18 2014 12/18/18 0103 12/18/18 0858 12/18/18 1431 12/19/18 0943 12/20/18 0411  NA 138 136 136  --  138 135 134*  K 3.8 3.2* 5.4*  --  3.2* 3.7 3.9  CL 104 106  --   --  104 104 102  CO2 20 23  --   --  25 24 22   GLUCOSE 79 120*  --   --  90 89 89  BUN 16 11  --   --  10 10 9   CREATININE 1.12* 1.33*  --   --  1.26* 1.00 1.17*  CALCIUM 8.4* 8.2*  --   --  7.9* 8.1* 7.6*  MG  --   --   --  1.5*  --  2.0  --   PHOS  --   --   --  3.5  --   --   --     Liver Function Tests: Recent Labs  Lab 12/18/18 1431 12/19/18 0943 12/20/18 0411  AST 25 20 23   ALT 58* 46* 39  ALKPHOS 36* 36* 36*  BILITOT 3.7* 3.4* 1.8*  PROT 5.2* 5.4* 5.5*  ALBUMIN 2.3* 2.1* 2.1*   No results for input(s): LIPASE, AMYLASE in the last 168 hours. No results for input(s): AMMONIA in the last 168 hours.  CBC: Recent Labs  Lab 12/16/18 1150 12/17/18 2014 12/18/18 0103 12/19/18 0943 12/19/18 1854 12/20/18 0411  WBC 7.1 13.7*  --  16.0* 15.7* 12.3*  HGB 10.4* 10.5* 12.2 9.2*  9.3* 9.4*  HCT 34.3 35.9* 36.0 31.6* 31.4* 31.2*  MCV 76* 75.9*  --  77.3* 77.1* 76.8*  PLT 239 264  --  226 218 206    Cardiac Enzymes: Recent Labs  Lab 12/17/18 2215 12/18/18 0858 12/18/18 1431 12/18/18 2034  TROPONINI 0.14* 0.21* 0.21* 0.16*    BNP: BNP (last 3 results) Recent Labs    12/17/18 2014 12/19/18 0943  BNP 836.8* 192.4*    ProBNP (last 3 results) Recent Labs    12/16/18 1150  PROBNP 3,613*     CBG: No results for input(s): GLUCAP in the last 168 hours.  Coagulation Studies: No results for input(s): LABPROT, INR in the last 72 hours.   Imaging   Dg Chest Port 1 View  Result Date: 12/19/2018 CLINICAL DATA:  Shortness of breath EXAM: PORTABLE CHEST 1 VIEW COMPARISON:  December 19, 2018 FINDINGS: Stable cardiomegaly. No pneumothorax. The hila and mediastinum are unchanged. Stable infiltrate in the right lower lobe. No other acute abnormalities. IMPRESSION: Stable right lower lobe infiltrate. Recommend follow-up to resolution. No other change. Electronically Signed   By: Dorise Bullion III M.D   On: 12/19/2018 18:02      Medications:     Current Medications: . hydrALAZINE  20 mg Oral Q8H  . isosorbide mononitrate  30 mg Oral Daily  . potassium chloride  20 mEq Oral Daily  . sodium chloride flush  3 mL Intravenous Q12H  . spironolactone  12.5 mg Oral Daily     Infusions: . sodium chloride    . amiodarone 30 mg/hr (12/20/18 0600)  . ceFEPime (MAXIPIME) IV 2 g (12/20/18 3546)  . furosemide (LASIX) infusion 4 mg/hr (12/20/18 0600)  . heparin 1,900 Units/hr (12/20/18 0600)  Patient Profile   [name] [name] is a 33 y.o. female with h/o pregnancy induced HTN and suspected diastolic CHF.   CHF team asked to see with marked volume overload and new systolic CHF.   Assessment/Plan   1. Acute systolic CHF - Suspect NICM in setting of HTN, though family history of earlier CAD.  - Echo 12/18/18 LVEF 20-25%, Severe LAE, Moderately reduced RV,  Mod pericardial effusion (circumferential).  - Volume status markedly elevated on exam - Increase lasix gtt to 10 mg/hr.  - Will give 2.5 mg of metolazone - Continue imdur 30 mg daily and hydralazine 20 mg TID - Increase spiro to 25 mg daily. - Hold coreg with acute decompensation - Follow K .   2. Acute PE - Chest CTA demonstrated acute segmental right middle and right lower lobe pulmonary emboli without obvious right heart strain. - Continue heparin for now in case needs cath.  - Transition to Eliquis after any procedures.   3. HTN - Will adjust meds in setting of treating her CHF  4. PNA - By chest CT. - On cefepime per primary.  - Blood Cx pending.   5. Paroxysmal atrial fibrillation/SVT - Noted on tele.  - Remains on IV amio at 30 mg/hr.  - This patients CHA2DS2-VASc Score is at least 3.   6. Moderate pericardial effusion - Follow with diuresis. No tamponade physiology.   7. Suspected OSA - Will need sleep study as outpatient  8. Morbid obesity - Body mass index is 40.63 kg/m.  - Will need encouraged weight loss as outpatient.   Medication concerns reviewed with patient and pharmacy team. Barriers identified: Questionable compliance.   Length of Stay: 3  Annamaria Helling  12/20/2018, 9:06 AM  Advanced Heart Failure Team Pager [phone] (M-F; 7a - 4p)  Please contact [facility] for night-coverage after hours (4p -7a ) and weekends on [email or web address]  Patient seen with PA, agree with the above note.   Patient has a prior history of pregnancy-induced hypertension but no other known cardiovascular disease.  Her mother had a "heart attack" at 67 and died.  She has not been pregnant recently.    She developed increasing exertional dyspnea in the late fall.  In 12/19, she was admitted to the hospital at [facility] with shortness of breath.  She says that they told her that her heart was "weak" but treated her for PNA.  Since then, she has had progressive  dyspnea and weight gain.  She was admitted on 3/6 with marked dyspnea.  She had been out of lisinopril and amlodipine.  She had hemoptysis, CTA chest showed right-sided PEs as well as right-sided PNA (did not look like pulmonary infarction).  She had a fever as high as 103 yesterday, 100.4 this morning.  She has had paroxysmal atrial fibrillation so was started on amiodarone gtt.  She is on heparin gtt.  Still with dyspnea/orthopnea.  Echo was noted to show EF 20-25% with moderate pericardial effusion, moderately decreased RV systolic function.   On exam, JVP 14-16 cm.  Decreased BS on right.  Regular S1S2.  1+ ankle edema.   1. Acute on chronic systolic CHF: She has a cardiomyopathy of uncertain etiology.  EF was reportedly low on echo at hospital in [facility] in 12/19.  This admission, EF 20-25% with moderate RV systolic dysfunction.  As above, no recent pregnancy so doubt peri-partum CMP.  Cannot rule out viral myocarditis as cause or long-standing uncontrolled HTN.  On  exam, she is volume overloaded.  - Place PICC to follow CVP and co-ox.  - Will eventually need RHC/LHC.  - Increase Lasix gtt to 10 mg/hr and will give a dose of metolazone 2.5 x 1.  - Continue Imdur 30 daily + hydralazine 20 mg tid.  - Increase spironolactone to 25 mg daily.  - Decrease Coreg from home 12.5 mg bid to 3.125 mg bid.  - If creatinine/BP remain stable, add Entresto.  2. Pulmonary embolus: On right.  She says that she has not been moving much at all for weeks due to exertional dyspnea.  She has no family history of VTE.  - Continue heparin gtt for now pending procedures.  Transition to Eliquis eventually.  3. Atrial fibrillation: Paroxysmal, currently in NSR.  No prior history.  Likely triggered by CHF and PE.  - Continue heparin gtt for now, eventually Eliquis.  - Continue amiodarone gtt for the time being.  4. PNA: Chest CT suggestive of PNA, not pulmonary infarction.  - Continue cefepime per primary service.    5. Moderate pericardial effusion: Diurese.  No tamponade.   Loralie Champagne 12/20/2018 11:15 AM

## 2018-12-20 NOTE — Evaluation (Signed)
Physical Therapy Evaluation Patient Details Name: [name] [name] MRN: [id number] DOB: [birth year] Today's Date: 12/20/2018   History of Present Illness  33 yr old F w/ presents with SOB found to have right middle and right lower lobe segmental pulmonary emboli with airspace opacities in the same lobes with right pleural effusion, developed SVT/AFRVR.  Clinical Impression  Orders received for PT evaluation. Patient demonstrates deficits in functional mobility as indicated below. Will benefit from continued skilled PT to address deficits and maximize function. Will see as indicated and progress as tolerated.  At this time, patient activity tolerance significantly limited due to coughing bouts. Anticipate patient will progress well once coughing clears.     Follow Up Recommendations Home health PT;Supervision/Assistance - 24 hour(pending progress)    Equipment Recommendations  (tbd)    Recommendations for Other Services       Precautions / Restrictions Restrictions Weight Bearing Restrictions: No      Mobility  Bed Mobility Overal bed mobility: Needs Assistance Bed Mobility: Supine to Sit     Supine to sit: Min guard;HOB elevated     General bed mobility comments: heavy use of rails, increased time and effort requiring several rest breaks, but able to perform without physical assist  Transfers Overall transfer level: Needs assistance Equipment used: 2 person hand held assist Transfers: Sit to/from Stand Sit to Stand: Min assist;+2 physical assistance;+2 safety/equipment;From elevated surface         General transfer comment: uses hand held assist for balance and boost, takes several attempts to square feet under her, and unsteady  Ambulation/Gait Ambulation/Gait assistance: +2 physical assistance;Min assist Gait Distance (Feet): 12 Feet Assistive device: 2 person hand held assist Gait Pattern/deviations: Step-to pattern;Decreased stride length;Shuffle Gait velocity:  decreased Gait velocity interpretation: <1.31 ft/sec, indicative of household ambulator General Gait Details: +2 assist for stability, noted limitations in activity tolerance  Stairs            Wheelchair Mobility    Modified Rankin (Stroke Patients Only)       Balance Overall balance assessment: Needs assistance Sitting-balance support: No upper extremity supported;Feet supported Sitting balance-Leahy Scale: Fair Sitting balance - Comments: EOB   Standing balance support: Bilateral upper extremity supported;During functional activity Standing balance-Leahy Scale: Poor Standing balance comment: dependent on external support                             Pertinent Vitals/Pain Pain Assessment: 0-10 Pain Score: 6  Pain Descriptors / Indicators: Discomfort;Moaning;Constant;Shooting Pain Intervention(s): Monitored during session    Home Living Family/patient expects to be discharged to:: Private residence Living Arrangements: Spouse/significant other;Children(3 y/o2) Available Help at Discharge: Family Type of Home: House Home Access: Stairs to enter(12-13 stairs to enter)   Technical brewer of Steps: 12 Home Layout: Two level Home Equipment: None Additional Comments: pt. 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, does cooking/cleaning     Hand Dominance   Dominant Hand: Right    Extremity/Trunk Assessment   Upper Extremity Assessment Upper Extremity Assessment: Overall WFL for tasks assessed    Lower Extremity Assessment Lower Extremity Assessment: Generalized weakness       Communication   Communication: No difficulties  Cognition Arousal/Alertness: Awake/alert Behavior During Therapy: WFL for tasks assessed/performed Overall Cognitive Status: Within Functional Limits for tasks assessed  General  Comments General comments (skin integrity, edema, etc.): VSS throughout, multiple episodes of coughing/spitting up throughout session    Exercises     Assessment/Plan    PT Assessment Patient needs continued PT services  PT Problem List Decreased strength;Decreased balance;Decreased activity tolerance;Decreased mobility;Cardiopulmonary status limiting activity;Pain       PT Treatment Interventions DME instruction;Gait training;Therapeutic activities;Therapeutic exercise;Balance training;Stair training;Functional mobility training;Neuromuscular re-education;Patient/family education    PT Goals (Current goals can be found in the Care Plan section)  Acute Rehab PT Goals Patient Stated Goal: to get better and get home to son PT Goal Formulation: With patient Time For Goal Achievement: 01/03/19 Potential to Achieve Goals: Good    Frequency Min 3X/week   Barriers to discharge        Co-evaluation PT/OT/SLP Co-Evaluation/Treatment: Yes Reason for Co-Treatment: Complexity of the patient's impairments (multi-system involvement) PT goals addressed during session: Mobility/safety with mobility OT goals addressed during session: ADL's and self-care       AM-PAC PT "6 Clicks" Mobility  Outcome Measure Help needed turning from your back to your side while in a flat bed without using bedrails?: A Little Help needed moving from lying on your back to sitting on the side of a flat bed without using bedrails?: A Little Help needed moving to and from a bed to a chair (including a wheelchair)?: A Little Help needed standing up from a chair using your arms (e.g., wheelchair or bedside chair)?: A Little Help needed to walk in hospital room?: A Little Help needed climbing 3-5 steps with a railing? : A Little 6 Click Score: 18    End of Session Equipment Utilized During Treatment: Gait belt;Oxygen Activity Tolerance: Patient limited by fatigue Patient left: in chair;with call bell/phone within  reach Nurse Communication: Mobility status PT Visit Diagnosis: Difficulty in walking, not elsewhere classified (R26.2);Muscle weakness (generalized) (M62.81)    Time: 5638-9373 PT Time Calculation (min) (ACUTE ONLY): 24 min   Charges:   PT Evaluation $PT Eval Moderate Complexity: 1 Mod          Alben Deeds, PT DPT  Board Certified Neurologic Specialist Acute Rehabilitation Services Pager [phone] Office [phone]   Duncan Dull 12/20/2018, 12:50 PM

## 2018-12-20 NOTE — Progress Notes (Signed)
Occupational Therapy Evaluation Patient Details Name: [name] [name] MRN: [id number] DOB: 06-26-1986 Today's Date: 12/20/2018    History of Present Illness 33 yr old F w/ presents with SOB found to have right middle and right lower lobe segmental pulmonary emboli with airspace opacities in the same lobes with right pleural effusion, developed SVT/AFRVR.   Clinical Impression   PTA Pt independent in ADL and mobility. 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 Pt is currently min A +2 for transfers and short in room mobility. Overall min guard for standing grooming tasks with decreased activity tolerance, balance, and bouts of coughing that limit her (in addition to pain) Pt will benefit from skilled OT acutely to maximize safety and independence in ADL and functional transfers, but anticipate no OT follow up at this time. Next session to focus on energy conservation and LB ADL to assess if we need to bring in LB AE.     Follow Up Recommendations  No OT follow up;Supervision/Assistance - 24 hour(initially; watch for progress)    Equipment Recommendations  3 in 1 bedside commode    Recommendations for Other Services       Precautions / Restrictions Restrictions Weight Bearing Restrictions: No      Mobility Bed Mobility Overal bed mobility: Needs Assistance Bed Mobility: Supine to Sit     Supine to sit: Min guard;HOB elevated     General bed mobility comments: heavy use of rails, increased time and effort requiring several rest breaks, but able to perform without physical assist  Transfers Overall transfer level: Needs assistance Equipment used: 2 person hand held assist Transfers: Sit to/from Stand Sit to Stand: Min assist;+2 physical assistance;+2 safety/equipment;From elevated surface         General transfer comment: uses hand held assist for balance and boost, takes several attempts to square feet under her, and unsteady    Balance Overall balance assessment:  Needs assistance Sitting-balance support: No upper extremity supported;Feet supported Sitting balance-Leahy Scale: Fair Sitting balance - Comments: EOB   Standing balance support: Bilateral upper extremity supported;During functional activity Standing balance-Leahy Scale: Poor Standing balance comment: dependent on external support                           ADL either performed or assessed with clinical judgement   ADL Overall ADL's : Needs assistance/impaired Eating/Feeding: Set up;Sitting   Grooming: Wash/dry face;Wash/dry hands;Min guard;Standing Grooming Details (indicate cue type and reason): fatigues very quickly, requires seated rest breaks - impacted by coughing Upper Body Bathing: Moderate assistance;Sitting   Lower Body Bathing: Moderate assistance;Sitting/lateral leans   Upper Body Dressing : Set up;Sitting Upper Body Dressing Details (indicate cue type and reason): to don hospital gown as robe Lower Body Dressing: Min guard;Bed level Lower Body Dressing Details (indicate cue type and reason): to adjust socks Toilet Transfer: Minimal assistance;+2 for physical assistance;+2 for safety/equipment;Ambulation(HHA)   Toileting- Clothing Manipulation and Hygiene: Minimal assistance;+2 for physical assistance;+2 for safety/equipment;Sit to/from stand       Functional mobility during ADLs: Minimal assistance;+2 for physical assistance;+2 for safety/equipment(hand held assist; fatigues quickly) General ADL Comments: decreased balance, activity tolerance     Vision Baseline Vision/History: Wears glasses Wears Glasses: Reading only Patient Visual Report: No change from baseline Vision Assessment?: No apparent visual deficits     Perception     Praxis      Pertinent Vitals/Pain Pain Assessment: 0-10 Pain Score: 6  Pain Descriptors / Indicators:  Discomfort;Moaning;Constant;Shooting Pain Intervention(s): Monitored during session;Repositioned     Hand  Dominance Right   Extremity/Trunk Assessment Upper Extremity Assessment Upper Extremity Assessment: Overall WFL for tasks assessed   Lower Extremity Assessment Lower Extremity Assessment: Defer to PT evaluation       Communication Communication Communication: No difficulties   Cognition Arousal/Alertness: Awake/alert Behavior During Therapy: WFL for tasks assessed/performed Overall Cognitive Status: Within Functional Limits for tasks assessed                                     General Comments  VSS throughout, multiple episodes of coughing/spitting up throughout session    Exercises     Shoulder Instructions      Home Living Family/patient expects to be discharged to:: Private residence Living Arrangements: Spouse/significant other;Children(3 y/o2) Available Help at Discharge: Family Type of Home: House Home Access: Stairs to enter(12-13 stairs to enter) Technical brewer of Steps: 12   Home Layout: Two level Alternate Level Stairs-Number of Steps: flight   Bathroom Shower/Tub: Walk-in shower;Tub only   Bathroom Toilet: Standard Bathroom Accessibility: Yes How Accessible: Accessible via walker Home Equipment: None   Additional Comments: pt. 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, does cooking/cleaning        OT Problem List: Decreased activity tolerance;Impaired balance (sitting and/or standing);Decreased knowledge of use of DME or AE;Cardiopulmonary status limiting activity;Obesity;Pain      OT Treatment/Interventions: Self-care/ADL training;Energy conservation;DME and/or AE instruction;Therapeutic activities;Patient/family education;Balance training    OT Goals(Current goals can be found in the care plan section) Acute Rehab OT Goals Patient Stated Goal: to get better and get home to son OT Goal Formulation: With patient Time For Goal  Achievement: 01/03/19 Potential to Achieve Goals: Good  OT Frequency: Min 3X/week   Barriers to D/C:            Co-evaluation PT/OT/SLP Co-Evaluation/Treatment: Yes Reason for Co-Treatment: Complexity of the patient's impairments (multi-system involvement);For patient/therapist safety;To address functional/ADL transfers PT goals addressed during session: Mobility/safety with mobility;Balance;Strengthening/ROM OT goals addressed during session: ADL's and self-care      AM-PAC OT "6 Clicks" Daily Activity     Outcome Measure Help from another person eating meals?: None Help from another person taking care of personal grooming?: A Little Help from another person toileting, which includes using toliet, bedpan, or urinal?: A Lot Help from another person bathing (including washing, rinsing, drying)?: A Little Help from another person to put on and taking off regular upper body clothing?: A Little Help from another person to put on and taking off regular lower body clothing?: A Little 6 Click Score: 18   End of Session Equipment Utilized During Treatment: Gait belt Nurse Communication: Mobility status  Activity Tolerance: Patient limited by fatigue Patient left: in chair;with call bell/phone within reach  OT Visit Diagnosis: Unsteadiness on feet (R26.81);Other abnormalities of gait and mobility (R26.89);Muscle weakness (generalized) (M62.81);Pain Pain - Right/Left: Right Pain - part of body: (chest)                Time: 1040-1110 OT Time Calculation (min): 30 min Charges:  OT General Charges $OT Visit: 1 Visit OT Evaluation $OT Eval Moderate Complexity: [place] OTR/L Acute Rehabilitation Services Pager: [phone] Office: [place] 12/20/2018, 11:48 AM

## 2018-12-20 NOTE — Progress Notes (Signed)
TRH Signoff Note  Patient rapidly declined on 3/8 and was transferred to ICU.  PCCM has taken over care.  Cardiology continues to consult.  I discussed with Dr. Valeta Harms today.  TRH will sign off at this time.  Please reconsult TRH for any further assistance.  Vernell Leep, MD, FACP, [facility]. [facility]  To contact the attending provider between 7A-7P or the covering provider during after hours 7P-7A, please log into the web site [email or web address] and access using universal [place] password for that web site. If you do not have the password, please call the hospital operator.

## 2018-12-20 NOTE — Progress Notes (Signed)
NAME:  [name] [name], MRN:  [id number], DOB:  24-Aug-1986, LOS: 3 ADMISSION DATE:  12/17/2018, CONSULTATION DATE:  3/6 REFERRING MD:  [place] , CHIEF COMPLAINT:  SOB    Brief History   33 yr old F w/ presents with SOB found to have right middle and right lower lobe segmental pulmonary emboli with airspace opacities in the same lobes with right pleural effusion. PCCM consulted on the floor for recommendations for AHRF. Today developed SVT/AFRVR. Will be transferred to ICU.  Past Medical History  PMHx Pregnancy induced HTN, condyloma acuminata, morbid obesity  Significant Hospital Events   3/6 Admitted 3/8 SVT requiring ICU transfer for potential cardioversion  Consults:  PCCM 3/6   Procedures:   Significant Diagnostic Tests:  CTA 3/6: Segmental occlusion involving RML and RLL pulmonary artery, RML and RLL GGO and small right pleural effusion TTE 3/7: EF 20-25% compared to EF 60-65% in 2016, reduced RV systolic function, LA dilation, moderate pericardial effusion LE Dopplers 3/8: No LE DVT  ANA reflex pending   Micro Data:  BCX 3/7 > Sputum CX sent  Strep ag: pending  Legionella: pending   Antimicrobials:  Azithro 3/6->> Maxipime 3/7>> Vanc 3/7 >>  Interim history/subjective:  Alert oriented, following commands. Coughing overnight.   Objective   Blood pressure (!) 133/92, pulse 99, temperature (!) 100.4 F (38 C), temperature source Oral, resp. rate (!) 35, height 5' 3.5" (1.613 m), weight 105.7 kg, SpO2 100 %.        Intake/Output Summary (Last 24 hours) at 12/20/2018 1117 Last data filed at 12/20/2018 1000 Gross per 24 hour  Intake 1154.32 ml  Output 700 ml  Net 454.32 ml   Filed Weights   12/18/18 1627 12/20/18 0600  Weight: 103.1 kg 105.7 kg    Physical Exam: General: young fm, resting in chair, no distress   HENT: NCAT, sclera clear Respiratory: BL lower lobe rhonchi Cardiovascular: RRR, no MRG  GI: BS present, NT, ND, obese pannus   Extremities: BL LE  edema, 3+  Neuro: AAOX4, moving all 4 extremities  Psych: Normal mood, normal affect   Resolved Hospital Problem list     Assessment & Plan:   Fever, worsening leukocytosis Acute respiratory failure secondary to suspected heart failure, pneumonia and PEs She does have cough and infiltrate consistent with pneumonia  Plan Continue cefepime, azithromycin  Broaden antibiotics  I requested sputum cultures again  Urine antigens for strep and legionella sent  Robitussin for cough Can advance diet as tolerated   Acute systolic heart failure Pericardial effusion SVT s/p adenosine TTE with EF 20-25% compared to normal EF in 2016. Unclear etiology. ?pna treated in decemeber while in [facility] diuresing  HF regimen per them  At risk for decompensation. Agree to observe in the ICU  ANA relfex pending    Right-sided segmental pulmonary emboli LE Dopplers negative Plan Continue heparin ggt   Community-acquired pneumonia On cefepime and azithromycin  RVAP - negative   Hemoptysis Will continue to observe   Hypomagnesium  Will follow   Best practice:  Diet: NPO Pain/Anxiety/Delirium protocol (if indicated): na VAP protocol (if indicated): na DVT prophylaxis: Hep drip  GI prophylaxis: na  Glucose control: monitor BS  Mobility: OOB as tolerated Code Status: full  Family Communication: Update patient Disposition: Transferred to ICU  Labs   CBC: Recent Labs  Lab 12/16/18 1150 12/17/18 2014 12/18/18 0103 12/19/18 0943 12/19/18 1854 12/20/18 0411  WBC 7.1 13.7*  --  16.0* 15.7*  12.3*  HGB 10.4* 10.5* 12.2 9.2* 9.3* 9.4*  HCT 34.3 35.9* 36.0 31.6* 31.4* 31.2*  MCV 76* 75.9*  --  77.3* 77.1* 76.8*  PLT 239 264  --  226 218 128    Basic Metabolic Panel: Recent Labs  Lab 12/16/18 1150 12/17/18 2014 12/18/18 0103 12/18/18 0858 12/18/18 1431 12/19/18 0943 12/20/18 0411  NA 138 136 136  --  138 135 134*  K 3.8 3.2* 5.4*  --  3.2* 3.7 3.9    CL 104 106  --   --  104 104 102  CO2 20 23  --   --  25 24 22   GLUCOSE 79 120*  --   --  90 89 89  BUN 16 11  --   --  10 10 9   CREATININE 1.12* 1.33*  --   --  1.26* 1.00 1.17*  CALCIUM 8.4* 8.2*  --   --  7.9* 8.1* 7.6*  MG  --   --   --  1.5*  --  2.0  --   PHOS  --   --   --  3.5  --   --   --    GFR: Estimated Creatinine Clearance: 81.8 mL/min (A) (by C-G formula based on SCr of 1.17 mg/dL (H)). Recent Labs  Lab 12/17/18 2014 12/19/18 0943 12/19/18 1854 12/20/18 0411  WBC 13.7* 16.0* 15.7* 12.3*    Liver Function Tests: Recent Labs  Lab 12/18/18 1431 12/19/18 0943 12/20/18 0411  AST 25 20 23   ALT 58* 46* 39  ALKPHOS 36* 36* 36*  BILITOT 3.7* 3.4* 1.8*  PROT 5.2* 5.4* 5.5*  ALBUMIN 2.3* 2.1* 2.1*   No results for input(s): LIPASE, AMYLASE in the last 168 hours. No results for input(s): AMMONIA in the last 168 hours.  ABG    Component Value Date/Time   HCO3 23.8 12/18/2018 0103   TCO2 25 12/18/2018 0103   O2SAT 94.0 12/18/2018 0103     Coagulation Profile: No results for input(s): INR, PROTIME in the last 168 hours.  Cardiac Enzymes: Recent Labs  Lab 12/17/18 2215 12/18/18 0858 12/18/18 1431 12/18/18 2034  TROPONINI 0.14* 0.21* 0.21* 0.16*    HbA1C: No results found for: HGBA1C  CBG: No results for input(s): GLUCAP in the last 168 hours.    Garner Nash, DO [facility] 12/20/2018 11:17 AM  Personal pager: [phone] If unanswered, please page CCM On-call: [phone]

## 2018-12-20 NOTE — Progress Notes (Signed)
ANTICOAGULATION CONSULT NOTE  Pharmacy Consult for heparin Indication: pulmonary embolus  No Known Allergies  Patient Measurements: Height: 5' 3.5" (161.3 cm) Weight: 233 lb 0.4 oz (105.7 kg) IBW/kg (Calculated) : 53.55 Heparin Dosing Weight: 78.1  Vital Signs: Temp: 100.4 F (38 C) (03/09 0700) Temp Source: Oral (03/09 0700) BP: 120/71 (03/09 0800) Pulse Rate: 48 (03/09 0800)  Labs: Recent Labs    12/18/18 0858  12/18/18 1431 12/18/18 2034 12/19/18 0943 12/19/18 1854 12/20/18 0411  HGB  --   --   --   --  9.2* 9.3* 9.4*  HCT  --   --   --   --  31.6* 31.4* 31.2*  PLT  --   --   --   --  226 218 206  HEPARINUNFRC 0.15*   < >  --  0.31 0.19* 0.36 0.35  CREATININE  --   --  1.26*  --  1.00  --  1.17*  TROPONINI 0.21*  --  0.21* 0.16*  --   --   --    < > = values in this interval not displayed.    Estimated Creatinine Clearance: 81.8 mL/min (A) (by C-G formula based on SCr of 1.17 mg/dL (H)).  Assessment: 33 yo female on IV heparin infusion for acute right middle and right lower lobe segmental pulmonary emboli .  No evidence of right heart strain.  She was not on anticoagulation PTA.  Still having small episodes of hemoptysis.  PCCM noted hemoptysis likely 2/2 PE and/or pulm edema in setting of AC.  Urine pinkish/red per RN.  MD aware and heparin continuing for acute PE.  Heparin level within goal range this AM, no overt bleeding or complications noted.  Goal of Therapy:  Heparin level 0.3-0.7 units/ml Monitor platelets by anticoagulation protocol: Yes   Plan:  Continue heparin 1900 units/hr Monitor daily heparin level, CBC, and for s/sx of bleeding F/u plan for oral anticoag at time of discharge  Marguerite Olea, [place] Pharmacist Phone [phone]  12/20/2018 9:29 AM    Please check AMION for all [place] numbers  12/20/2018 9:25 AM

## 2018-12-20 NOTE — Progress Notes (Signed)
Peripherally Inserted Central Catheter/Midline Placement  The IV Nurse has discussed with the patient and/or persons authorized to consent for the patient, the purpose of this procedure and the potential benefits and risks involved with this procedure.  The benefits include less needle sticks, lab draws from the catheter, and the patient may be discharged home with the catheter. Risks include, but not limited to, infection, bleeding, blood clot (thrombus formation), and puncture of an artery; nerve damage and irregular heartbeat and possibility to perform a PICC exchange if needed/ordered by physician.  Alternatives to this procedure were also discussed.  Bard Power PICC patient education guide, fact sheet on infection prevention and patient information card has been provided to patient /or left at bedside.    PICC/Midline Placement Documentation        [name] [name] 12/20/2018, 5:34 PM

## 2018-12-20 NOTE — Progress Notes (Addendum)
Progress Note  Patient Name: [name] [name] Date of Encounter: 12/20/2018  Primary Cardiologist: Dorris Carnes, MD   Subjective   Had mild worsening of right sided chest pain last night, however she tells me that has improved.  Inpatient Medications    Scheduled Meds: . hydrALAZINE  20 mg Oral Q8H  . isosorbide mononitrate  30 mg Oral Daily  . potassium chloride  20 mEq Oral Daily  . sodium chloride flush  3 mL Intravenous Q12H  . spironolactone  12.5 mg Oral Daily   Continuous Infusions: . sodium chloride    . amiodarone 30 mg/hr (12/20/18 0600)  . ceFEPime (MAXIPIME) IV 2 g (12/20/18 4627)  . furosemide (LASIX) infusion 4 mg/hr (12/20/18 0600)  . heparin 1,900 Units/hr (12/20/18 0600)   PRN Meds: sodium chloride, acetaminophen, albuterol, hydrALAZINE, morphine injection, ondansetron **OR** ondansetron (ZOFRAN) IV, oxyCODONE, sodium chloride flush   Vital Signs    Vitals:   12/20/18 0600 12/20/18 0610 12/20/18 0700 12/20/18 0800  BP:  102/64 105/79 120/71  Pulse: (!) 107 95 86 (!) 48  Resp: (!) 26 (!) 0 (!) 24 (!) 26  Temp: 99 F (37.2 C)  (!) 100.4 F (38 C)   TempSrc: Oral  Oral   SpO2: 98% 99% 100% 100%  Weight: 105.7 kg     Height:        Intake/Output Summary (Last 24 hours) at 12/20/2018 0846 Last data filed at 12/20/2018 0350 Gross per 24 hour  Intake 1188.83 ml  Output 700 ml  Net 488.83 ml   Filed Weights   12/18/18 1627 12/20/18 0600  Weight: 103.1 kg 105.7 kg    Telemetry    Episode of afib this morning - Personally Reviewed  ECG    SVT rate 171 bpm on  12/19/18- Personally Reviewed  Physical Exam   GEN: No acute distress.   Neck: JVD to lower 1/3 of neck Cardiac: regular rhythm, normal rate, no murmurs, rubs, or gallops.  Respiratory: Clear to auscultation bilaterally. GI: Soft, nontender, non-distended  MS: 1+ bilateral edema to the thigh; No deformity. Neuro:  Nonfocal  Psych: Normal affect   Labs    Chemistry Recent Labs  Lab  12/18/18 1431 12/19/18 0943 12/20/18 0411  NA 138 135 134*  K 3.2* 3.7 3.9  CL 104 104 102  CO2 25 24 22   GLUCOSE 90 89 89  BUN 10 10 9   CREATININE 1.26* 1.00 1.17*  CALCIUM 7.9* 8.1* 7.6*  PROT 5.2* 5.4* 5.5*  ALBUMIN 2.3* 2.1* 2.1*  AST 25 20 23   ALT 58* 46* 39  ALKPHOS 36* 36* 36*  BILITOT 3.7* 3.4* 1.8*  GFRNONAA 57* >60 >60  GFRAA >60 >60 >60  ANIONGAP 9 7 10      Hematology Recent Labs  Lab 12/19/18 0943 12/19/18 1854 12/20/18 0411  WBC 16.0* 15.7* 12.3*  RBC 4.09 4.07 4.06  HGB 9.2* 9.3* 9.4*  HCT 31.6* 31.4* 31.2*  MCV 77.3* 77.1* 76.8*  MCH 22.5* 22.9* 23.2*  MCHC 29.1* 29.6* 30.1  RDW 16.4* 16.4* 16.4*  PLT 226 218 206    Cardiac Enzymes Recent Labs  Lab 12/17/18 2215 12/18/18 0858 12/18/18 1431 12/18/18 2034  TROPONINI 0.14* 0.21* 0.21* 0.16*    Recent Labs  Lab 12/17/18 2020  TROPIPOC 0.09*     BNP Recent Labs  Lab 12/16/18 1150 12/17/18 2014 12/19/18 0943  BNP  --  836.8* 192.4*  PROBNP 3,613*  --   --      DDimer No  results for input(s): DDIMER in the last 168 hours.   Radiology    Dg Chest Port 1 View  Result Date: 12/19/2018 CLINICAL DATA:  Shortness of breath EXAM: PORTABLE CHEST 1 VIEW COMPARISON:  December 19, 2018 FINDINGS: Stable cardiomegaly. No pneumothorax. The hila and mediastinum are unchanged. Stable infiltrate in the right lower lobe. No other acute abnormalities. IMPRESSION: Stable right lower lobe infiltrate. Recommend follow-up to resolution. No other change. Electronically Signed   By: Dorise Bullion III M.D   On: 12/19/2018 18:02   Dg Chest Port 1 View  Result Date: 12/19/2018 CLINICAL DATA:  Right chest pain and hemoptysis since Wednesday. Pneumonia. EXAM: PORTABLE CHEST 1 VIEW COMPARISON:  Plain film and CT of 12/17/2018 FINDINGS: Marked cardiomegaly. Small right pleural effusion is similar. No pneumothorax. Right mid and lower lung airspace disease is not significantly changed. The left lung remains clear. No  overt congestive failure. Low lung volumes with resultant pulmonary interstitial prominence. IMPRESSION: No significant change in right mid and lower lung airspace disease. Cardiomegaly and low lung volumes, without congestive failure. Electronically Signed   By: Abigail Miyamoto M.D.   On: 12/19/2018 09:32   Vas [place] Lower Extremity Venous (dvt)  Result Date: 12/18/2018  Lower Venous Study Indications: Pulmonary embolism, and hormone contraception use.  Performing Technologist: Maudry Mayhew MHA, RDMS, RVT, RDCS  Examination Guidelines: A complete evaluation includes B-mode imaging, spectral Doppler, color Doppler, and power Doppler as needed of all accessible portions of each vessel. Bilateral testing is considered an integral part of a complete examination. Limited examinations for reoccurring indications may be performed as noted.  Right Venous Findings: +---------+---------------+---------+-----------+----------+-------+          CompressibilityPhasicitySpontaneityPropertiesSummary +---------+---------------+---------+-----------+----------+-------+ CFV      Full           Yes      Yes                          +---------+---------------+---------+-----------+----------+-------+ SFJ      Full                                                 +---------+---------------+---------+-----------+----------+-------+ FV Prox  Full                                                 +---------+---------------+---------+-----------+----------+-------+ FV Mid   Full                                                 +---------+---------------+---------+-----------+----------+-------+ FV DistalFull                                                 +---------+---------------+---------+-----------+----------+-------+ POP      Full           Yes      Yes                          +---------+---------------+---------+-----------+----------+-------+  PTV      Full                                                  +---------+---------------+---------+-----------+----------+-------+ PERO     Full                                                 +---------+---------------+---------+-----------+----------+-------+  Left Venous Findings: +---------+---------------+---------+-----------+----------+--------------+          CompressibilityPhasicitySpontaneityPropertiesSummary        +---------+---------------+---------+-----------+----------+--------------+ CFV      Full           Yes      Yes                                 +---------+---------------+---------+-----------+----------+--------------+ SFJ      Full                                                        +---------+---------------+---------+-----------+----------+--------------+ FV Prox  Full                                                        +---------+---------------+---------+-----------+----------+--------------+ FV Mid   Full                                                        +---------+---------------+---------+-----------+----------+--------------+ FV Distal                                             Not visualized +---------+---------------+---------+-----------+----------+--------------+ POP      Full           Yes      Yes                                 +---------+---------------+---------+-----------+----------+--------------+ PTV      Full                                                        +---------+---------------+---------+-----------+----------+--------------+ PERO     Full                                                        +---------+---------------+---------+-----------+----------+--------------+  Summary: Right: There is no evidence of deep vein thrombosis in the lower extremity. No cystic structure found in the popliteal fossa. Left: There is no evidence of deep vein thrombosis in the lower extremity. However, portions of this  examination were limited- see technologist comments above. No cystic structure found in the popliteal fossa.  *See table(s) above for measurements and observations. Electronically signed by Monica Martinez MD on 12/18/2018 at 4:28:41 PM.    Final     Cardiac Studies  Echo 12/18/2018 IMPRESSIONS    1. The left ventricle has severely reduced systolic function, with an ejection fraction of 20-25%. The cavity size was mild to moderately dilated. There is mild concentric left ventricular hypertrophy. Left ventricular diastolic Doppler parameters are  consistent with pseudonormalization Elevated left ventricular end-diastolic pressure.  2. The right ventricle has moderately reduced systolic function. The cavity was normal. There is no increase in right ventricular wall thickness.  3. Left atrial size was severely dilated.  4. Moderate pericardial effusion.  5. The pericardial effusion is circumferential.  6. The mitral valve is normal in structure. Mild thickening of the mitral valve leaflet.  7. The tricuspid valve is normal in structure.  8. The aortic valve is normal in structure.  9. The pulmonic valve was normal in structure. 10. When compared to the prior study: When compared to the prior study from 10/11/2015 LVEF has decreased significantly from 60-65% to 20-25% with diffuse hypokinesis and moderatel left ventricular dilatation. RVEF is also moderately decreased.  SUMMARY   When compared to the prior study from 10/11/2015 LVEF has decreased significantly from 60-65% to 20-25% with diffuse hypokinesis and moderatel left ventricular dilatation. RVEF is also moderately decreased.  Patient Profile     33 y.o. female 33 y.o. female with a history of pregnancy-induced hypertension, reported weight gain over several weeks, and recent worsening shortness of breath with hemoptysis.  She has been diagnosed with acute segmental pulmonary emboli involving the right middle and lower lobes, also  possible right middle lobe pneumonia, and acute systolic heart failure with LVEF newly documented at 20 to 25% from normal previously.  She also has moderately reduced right ventricular contraction.  Assessment & Plan   Active Problems:   HCAP (healthcare-associated pneumonia)   Hemoptysis   Pulmonary emboli (HCC)   Chronic diastolic CHF (congestive heart failure) (HCC)   Elevated troponin   Sustained SVT ([place])   Cardiogenic shock ([place])   1. PAF-she had an episode of paroxysmal atrial fibrillation on telemetry this morning and had SVT yesterday for which she was started on an amiodarone infusion.  She is already on heparin for PE.  Currently she is rate controlled and no further action is required, however I imagine she will continue to have episodes of paroxysmal atrial fibrillation.  2.  Acute systolic heart failure with LVEF 20 to 25%- she is currently on a Lasix infusion but continues to have bilateral lower extremity swelling and dependent edema.  Though she says she feels slightly better, she does appear to be short of breath.  She continues to have hemoptysis in relation to possible pneumonia and acute segmental pulmonary emboli.  We will plan to have heart failure team review for any additional recommendations.  She is tolerating amiodarone at the moment, without evidence of decompensation from rapid SVT yesterday and paroxysmal A. fib today.  3.  Right middle lobe infiltrate, possible pneumonia, on antibiotics, per primary service.  4.  Moderate pericardial effusion, stable hemodynamics, no clinical evidence  of tamponade physiology.  5.  Essential hypertension-currently normotensive.  6.  Mild troponin elevation, without significant delta- likely related to acute medical illness, will continue to monitor.  Chest pain is right-sided and likely more consistent with acute pulmonary processes.  CRITICAL CARE TIME: I have spent a total of 35 minutes with patient reviewing hospital  notes, telemetry, EKGs, labs and examining the patient as well as establishing an assessment and plan that was discussed with the patient. > 50% of time was spent in direct patient care. The patient is critically ill with multi-organ system failure and requires high complexity decision making for assessment and support, frequent evaluation and titration of therapies, application of advanced monitoring technologies and extensive interpretation of multiple databases.   For questions or updates, please contact [facility] Please consult [email or web address] for contact info under        Signed, Elouise Munroe, MD  12/20/2018, 8:46 AM

## 2018-12-20 NOTE — Care Management (Signed)
#  4.   S/W  LINDA    @  [employer] # [phone]    RIVAROXABAN : NON-FORMULARY  1. XARELTO  15 MG BID COVER- YES CO-PAY- $ 125.00 Q/L TWO PILL PER DAY TIER- PREFERRED BRAND PRIOR APPROVAL- NO   2. XARELTO   20 MG DAILY COVER- YES CO-PAY- $ 113.69  Q/L ONE PILL PER DAY TIER- PREFERRED BRAND PRIOR APPROVAL- NO   APIXABAN : NON-FORMULARY  3. ELIQUIS  5 MG  BID COVER- YES CO-PAY- $ 113.90 TIER- PREFERRED BRAND PRIOR APPROVAL- NO  4. EL;IQUIS  2.5 MG BID COVER- YES CO-PAY- $ 113.90 TIER- PREFERRED BRAND PRIOR APPROVAL- NO  DEDUCTIBLE: NOT MET  PREFERRED PHARMACY : YES [facility]  OUT PATIENT [facility]  -

## 2018-12-21 LAB — BASIC METABOLIC PANEL
Anion gap: 11 (ref 5–15)
Anion gap: 12 (ref 5–15)
BUN: 10 mg/dL (ref 6–20)
BUN: 11 mg/dL (ref 6–20)
CALCIUM: 7.9 mg/dL — AB (ref 8.9–10.3)
CO2: 27 mmol/L (ref 22–32)
CO2: 26 mmol/L (ref 22–32)
Calcium: 8.1 mg/dL — ABNORMAL LOW (ref 8.9–10.3)
Chloride: 92 mmol/L — ABNORMAL LOW (ref 98–111)
Chloride: 92 mmol/L — ABNORMAL LOW (ref 98–111)
Creatinine, Ser: 1.24 mg/dL — ABNORMAL HIGH (ref 0.44–1.00)
Creatinine, Ser: 1.41 mg/dL — ABNORMAL HIGH (ref 0.44–1.00)
GFR calc Af Amer: >60 mL/min (ref >60)
GFR calc Af Amer: 57 mL/min — ABNORMAL LOW (ref >60)
GFR calc non Af Amer: 50 mL/min — ABNORMAL LOW (ref >60)
GFR, EST NON AFRICAN AMERICAN: 58 mL/min — AB (ref >60)
Glucose, Bld: 180 mg/dL — ABNORMAL HIGH (ref 70–99)
Glucose, Bld: 174 mg/dL — ABNORMAL HIGH (ref 70–99)
Potassium: 3.1 mmol/L — ABNORMAL LOW (ref 3.5–5.1)
Potassium: 3.2 mmol/L — ABNORMAL LOW (ref 3.5–5.1)
Sodium: 130 mmol/L — ABNORMAL LOW (ref 135–145)
Sodium: 130 mmol/L — ABNORMAL LOW (ref 135–145)

## 2018-12-21 LAB — CBC
HCT: 28.4 % — ABNORMAL LOW (ref 36.0–46.0)
Hemoglobin: 8.2 g/dL — ABNORMAL LOW (ref 12.0–15.0)
MCH: 22 pg — ABNORMAL LOW (ref 26.0–34.0)
MCHC: 28.9 g/dL — ABNORMAL LOW (ref 30.0–36.0)
MCV: 76.3 fL — ABNORMAL LOW (ref 80.0–100.0)
PLATELETS: 216 10*3/uL (ref 150–400)
RBC: 3.72 MIL/uL — ABNORMAL LOW (ref 3.87–5.11)
RDW: 16.3 % — AB (ref 11.5–15.5)
WBC: 9 10*3/uL (ref 4.0–10.5)
nRBC: 0 % (ref 0.0–0.2)

## 2018-12-21 LAB — COOXEMETRY PANEL
Carboxyhemoglobin: 1.8 % — ABNORMAL HIGH (ref 0.5–1.5)
METHEMOGLOBIN: 1 % (ref 0.0–1.5)
O2 Saturation: 57.3 %
Total hemoglobin: 8.5 g/dL — ABNORMAL LOW (ref 12.0–16.0)

## 2018-12-21 LAB — HEPARIN LEVEL (UNFRACTIONATED): Heparin Unfractionated: 0.48 IU/mL (ref 0.30–0.70)

## 2018-12-21 MED ORDER — HYDRALAZINE HCL 50 MG PO TABS: 50.0000 mg | ORAL_TABLET | Freq: Three times a day (TID) | ORAL | Status: DC

## 2018-12-21 MED ORDER — POTASSIUM CHLORIDE CRYS ER 20 MEQ PO TBCR
40.0000 meq | EXTENDED_RELEASE_TABLET | ORAL | Status: DC
  Administered 2018-12-21: 40 meq via ORAL
  Filled 2018-12-21: qty 2

## 2018-12-21 MED ORDER — HYDRALAZINE HCL 25 MG PO TABS
25.0000 mg | ORAL_TABLET | Freq: Once | ORAL | Status: DC
  Filled 2018-12-21: qty 1

## 2018-12-21 MED ORDER — HYDRALAZINE HCL 25 MG PO TABS
12.5000 mg | ORAL_TABLET | Freq: Three times a day (TID) | ORAL | Status: DC
  Administered 2018-12-21 – 2018-12-22 (×2): 12.5 mg via ORAL
  Filled 2018-12-21 (×2): qty 1

## 2018-12-21 MED ORDER — SACUBITRIL-VALSARTAN 24-26 MG PO TABS
1.0000 | ORAL_TABLET | Freq: Two times a day (BID) | ORAL | Status: DC
  Administered 2018-12-21 – 2018-12-22 (×3): 1 via ORAL
  Filled 2018-12-21 (×3): qty 1

## 2018-12-21 MED ORDER — ISOSORBIDE MONONITRATE ER 60 MG PO TB24
60.0000 mg | ORAL_TABLET | Freq: Every day | ORAL | Status: DC
  Administered 2018-12-21 – 2018-12-22 (×2): 60 mg via ORAL
  Filled 2018-12-21 (×2): qty 1

## 2018-12-21 MED ORDER — HYDRALAZINE HCL 25 MG PO TABS: 25.0000 mg | ORAL_TABLET | Freq: Three times a day (TID) | ORAL | Status: DC

## 2018-12-21 MED ORDER — POTASSIUM CHLORIDE 10 MEQ/50ML IV SOLN
10.0000 meq | INTRAVENOUS | Status: AC
  Administered 2018-12-21 (×6): 10 meq via INTRAVENOUS
  Filled 2018-12-21 (×6): qty 50

## 2018-12-21 MED ORDER — POTASSIUM CHLORIDE CRYS ER 20 MEQ PO TBCR: 20.0000 meq | EXTENDED_RELEASE_TABLET | Freq: Every day | ORAL | Status: DC

## 2018-12-21 MED ORDER — POTASSIUM CHLORIDE CRYS ER 20 MEQ PO TBCR
40.0000 meq | EXTENDED_RELEASE_TABLET | ORAL | Status: AC
  Administered 2018-12-21 (×2): 40 meq via ORAL
  Filled 2018-12-21 (×2): qty 2

## 2018-12-21 NOTE — TOC Benefit Eligibility Note (Signed)
Transition of Care [facility]) Benefit Eligibility Note    Patient Details  Name: [name] [name] MRN: [id number] Date of Birth: 02-04-1986   Medication/Dose:  Delene Loll   Covered?: Yes  Tier: Other  Prescription Coverage Preferred Pharmacy: [facility] with Person/Company/Phone Number:: JASMIN   @ [employer] # [phone]  Co-Pay: $ 125.00  Prior Approval: No  Deductible: Unmet   TIER- PREFERRED BRAND  ( ENTRESTO )   SACUBITRIL- VALSARTAN 40 MG  DAILY OR BID COVER- YES CO-PAY- $ 5.00  DAILY OR BID TIER- GENERIC PREFERRED  PRIOR APPROVAL- NO    Memory Argue Phone Number: 12/21/2018, 11:28 AM

## 2018-12-21 NOTE — Progress Notes (Addendum)
Advanced Heart Failure Rounding Note  PCP-Cardiologist: Dorris Carnes, MD   Subjective:    Cr 1.17 -> 1.24. K 3.1. Tmax 101.8 on cefepime. Azithromycin added back 12/20/18.  CVP 7-8 on my personal check, though JVP appears higher.   Breathing slightly better today. Remains swollen into thighs. Slightly tender from PICC line placement, Site looks OK.   Coox 57.3% this am.   Weight unchanged but negative 5 L. Bed weight x 2. Will try and stand for weight this am.   Objective:   Weight Range: 105.5 kg Body mass index is 40.55 kg/m.   Vital Signs:   Temp:  [99.2 F (37.3 C)-101.8 F (38.8 C)] 99.2 F (37.3 C) (03/10 0400) Pulse Rate:  [48-100] 83 (03/10 0700) Resp:  [18-45] 18 (03/10 0700) BP: (110-153)/(56-101) 149/83 (03/10 0700) SpO2:  [94 %-100 %] 94 % (03/10 0700) Weight:  [105.5 kg] 105.5 kg (03/10 0608) Last BM Date: (PTA)  Weight change: Filed Weights   12/18/18 1627 12/20/18 0600 12/21/18 0608  Weight: 103.1 kg 105.7 kg 105.5 kg    Intake/Output:   Intake/Output Summary (Last 24 hours) at 12/21/2018 0714 Last data filed at 12/21/2018 0600 Gross per 24 hour  Intake 2105.31 ml  Output 7450 ml  Net -5344.69 ml      Physical Exam    General: NAD  HEENT: Normal Neck: Supple. JVP 9-10 cm+. Carotids 2+ bilat; no bruits. No lymphadenopathy or thyromegaly appreciated. Cor: PMI nondisplaced. Regular rate & rhythm. No rubs, gallops or murmurs. Lungs: Slightly diminished basilar sounds.  Abdomen: Soft, nontender, nondistended. No hepatosplenomegaly. No bruits or masses. Good bowel sounds. Extremities: No cyanosis, clubbing, or rash. 1-2+ edema into thighs.  Neuro: Alert & orientedx3, cranial nerves grossly intact. moves all 4 extremities w/o difficulty. Affect pleasant  Telemetry   NSR 80-90s, personally reviewed.   EKG    No new tracings.    Labs    CBC Recent Labs    12/20/18 0411 12/21/18 0500  WBC 12.3* 9.0  HGB 9.4* 8.2*  HCT 31.2* 28.4*    MCV 76.8* 76.3*  PLT 206 035   Basic Metabolic Panel Recent Labs    12/18/18 0858  12/19/18 0943 12/20/18 0411 12/21/18 0500  NA  --    < > 135 134* 130*  K  --    < > 3.7 3.9 3.1*  CL  --    < > 104 102 92*  CO2  --    < > 24 22 27   GLUCOSE  --    < > 89 89 180*  BUN  --    < > 10 9 10   CREATININE  --    < > 1.00 1.17* 1.24*  CALCIUM  --    < > 8.1* 7.6* 8.1*  MG 1.5*  --  2.0  --   --   PHOS 3.5  --   --   --   --    < > = values in this interval not displayed.   Liver Function Tests Recent Labs    12/19/18 0943 12/20/18 0411  AST 20 23  ALT 46* 39  ALKPHOS 36* 36*  BILITOT 3.4* 1.8*  PROT 5.4* 5.5*  ALBUMIN 2.1* 2.1*   No results for input(s): LIPASE, AMYLASE in the last 72 hours. Cardiac Enzymes Recent Labs    12/18/18 0858 12/18/18 1431 12/18/18 2034  TROPONINI 0.21* 0.21* 0.16*    BNP: BNP (last 3 results) Recent Labs  12/17/18 2014 12/19/18 0943  BNP 836.8* 192.4*    ProBNP (last 3 results) Recent Labs    12/16/18 1150  PROBNP 3,613*     D-Dimer No results for input(s): DDIMER in the last 72 hours. Hemoglobin A1C No results for input(s): HGBA1C in the last 72 hours. Fasting Lipid Panel No results for input(s): CHOL, HDL, LDLCALC, TRIG, CHOLHDL, LDLDIRECT in the last 72 hours. Thyroid Function Tests Recent Labs    12/18/18 0858  TSH 1.236    Other results:   Imaging    [place] Ekg Site Rite  Result Date: 12/20/2018 If Site Rite image not attached, placement could not be confirmed due to current cardiac rhythm.     Medications:     Scheduled Medications: . carvedilol  3.125 mg Oral BID WC  . hydrALAZINE  20 mg Oral Q8H  . isosorbide mononitrate  30 mg Oral Daily  . [START ON 12/22/2018] potassium chloride  20 mEq Oral Daily  . potassium chloride  40 mEq Oral Q4H  . sodium chloride flush  10-40 mL Intracatheter Q12H  . sodium chloride flush  3 mL Intravenous Q12H  . spironolactone  25 mg Oral Daily      Infusions: . sodium chloride Stopped (12/21/18 0017)  . amiodarone 30 mg/hr (12/21/18 0600)  . azithromycin Stopped (12/20/18 1335)  . ceFEPime (MAXIPIME) IV 2 g (12/21/18 0640)  . furosemide (LASIX) infusion 10 mg/hr (12/21/18 0600)  . heparin 1,900 Units/hr (12/21/18 0600)     PRN Medications:  sodium chloride, acetaminophen, albuterol, guaiFENesin-codeine, hydrALAZINE, morphine injection, ondansetron **OR** ondansetron (ZOFRAN) IV, oxyCODONE, sodium chloride flush, sodium chloride flush    Patient Profile   [name] [name] is a 33 y.o. female with h/o pregnancy induced HTN and suspected diastolic CHF. Admitted with worsening SOB and hemoptysis. Found to have acute PE +/- PNA with fever.   CHF team asked to see with marked volume overload and new systolic CHF.   Assessment/Plan   1. Acute systolic CHF - Suspect NICM in setting of HTN, though family history of earlier CAD.  - Echo 12/18/18 LVEF 20-25%, Severe LAE, Moderately reduced RV, Mod pericardial effusion (circumferential).  - Volume status remains elevated. CVP ~7-8 but JVP appears higher on exam.  - Continue lasix gtt to 10 mg/hr.  - No ARB/ACE for now with AKI.  - Continue imdur 60 mg daily with hydralazine 25 mg tid.  - Can add Entresto 24/26 bid.  - Continue spiro 25 mg daily. - Hold coreg with acute decompensation - Follow K .   2. Acute PE - Chest CTA demonstrated acute segmental right middle and right lower lobe pulmonary emboli without obvious right heart strain. - Continue heparin for now in case needs cath. No change.  - Transition to Eliquis after any procedures.   3. HTN - Meds as above.   4. PNA - By chest CT. - On cefepime per primary. Azithro added back.  - Blood Cx pending.   5. Paroxysmal atrial fibrillation/SVT - Noted on tele.  - Remains on IV amio at 30 mg/hr.  - This patients CHA2DS2-VASc Score is at least 3.   6. Moderate pericardial effusion - Follow with diuresis. No  tamponade physiology. No change.   7. Suspected OSA - Will need sleep study as outpatient. No change.   8. Morbid obesity - Body mass index is 40.55 kg/m.  - Will need encouraged weight loss as outpatient.   9. Hypokalemia - K 3.1. Supp and continue to follow  with diuresis.   Medication concerns reviewed with patient and pharmacy team. Barriers identified: Questionable compliance.   Length of Stay: 4  Annamaria Helling  12/21/2018, 7:14 AM  Advanced Heart Failure Team Pager [phone] (M-F; 7a - 4p)  Please contact [facility] for night-coverage after hours (4p -7a ) and weekends on [email or web address]  Patient seen with PA, agree with the above note.   She diuresed well yesterday, net negative -5345.  CVP 8-9 this morning, co-ox 57%.  Creatinine stable.  Feeling better.   On exam, JVP 10-11 cm.  Decreased BS on right.  Regular S1S2.  1+ ankle edema.   1. Acute on chronic systolic CHF: She has a cardiomyopathy of uncertain etiology.  EF was reportedly low on echo at hospital in [facility] in 12/19.  This admission, EF 20-25% with moderate RV systolic dysfunction.  As above, no recent pregnancy so doubt peri-partum CMP.  Cannot rule out viral myocarditis as cause or long-standing uncontrolled HTN.  She has diuresed well, now CVP 8-9 with co-ox 57%.  - Will eventually need RHC/LHC versus coronary CTA.  - Continue Lasix gtt one more day, no metolazone. - Continue Imdur 30 daily + hydralazine 25 mg tid.  - Continue spironolactone 25 mg daily.  - Decreased Coreg from home 12.5 mg bid to 3.125 mg bid.  - Add Entresto 24/26 bid.   2. Pulmonary embolus: On right.  She says that she has not been moving much at all for weeks due to exertional dyspnea.  She has no family history of VTE.  - Continue heparin gtt for now pending procedures.  Transition to Eliquis eventually.  3. Atrial fibrillation: Paroxysmal, currently in NSR.  No prior history.  Likely triggered by CHF and PE.  -  Continue heparin gtt for now, eventually Eliquis.  - Continue amiodarone gtt for the time being.  4. PNA: Chest CT suggestive of PNA, not pulmonary infarction. Temp 101.8 last night, WBCs coming down.  - Continue cefepime/azithromycin per primary service.  5. Moderate pericardial effusion: Diurese.  No tamponade.    Mobilize.   CRITICAL CARE Performed by: Loralie Champagne  Total critical care time: 35 minutes  Critical care time was exclusive of separately billable procedures and treating other patients.  Critical care was necessary to treat or prevent imminent or life-threatening deterioration.  Critical care was time spent personally by me on the following activities: development of treatment plan with patient and/or surrogate as well as nursing, discussions with consultants, evaluation of patient's response to treatment, examination of patient, obtaining history from patient or surrogate, ordering and performing treatments and interventions, ordering and review of laboratory studies, ordering and review of radiographic studies, pulse oximetry and re-evaluation of patient's condition.  Loralie Champagne 12/21/2018 7:48 AM

## 2018-12-21 NOTE — TOC Initial Note (Addendum)
Transition of Care [facility]) - Initial/Assessment Note    Patient Details  Name: [name] [name] MRN: [id number] Date of Birth: [birth year]  Transition of Care [facility]) CM/SW Contact:    Midge Minium RN, BSN, NCM-BC, ACM-RN [phone] Phone Number: 12/21/2018, 3:43 PM  Clinical Narrative:                 CM met with patient to discuss transitional needs. Eliquis and Entresto benefits check is complete with est copay cost discussed with patient, with an Eliquis/Entresto 30-day free card and copay cards provided, with patient verbalizing understanding. Patient would like to utilize [facility] for her Rx needs prior to transitioning home. CM discussed PT recommendations for HHPT/BSC, with patient declining at this time, but agreeable to a [facility] for CHF management. [place] referral given to [place], [facility] liaison; AVS updated. Patient may benefit from [place] team will continue to follow.   Expected Discharge Plan: [place] Barriers to Discharge: Continued Medical Work up   Patient Goals and CMS Choice Patient states their goals for this hospitalization and ongoing recovery are:: "To get better and get home to my son" CMS Medicare.gov Compare Post Acute Care list provided to:: Patient Choice offered to / list presented to : Patient  Expected Discharge Plan and Services Expected Discharge Plan: [place] Discharge Planning Services: CM Consult Post Acute Care Choice: [place] arrangements for the past 2 months: Single Family Home                 DME Arranged: N/A DME Agency: NA HH Arranged: RN, Disease Management [place] Agency: Advertising account executive (now [facility])  Prior Living Arrangements/Services Living arrangements for the past 2 months: [place] Lives with:: Self, Minor Children Patient language and need for interpreter reviewed:: No Do you feel safe going back to the place where you live?: Yes      Need for Family  Participation in Patient Care: No (Comment) Care giver support system in place?: Yes (comment)   Criminal Activity/Legal Involvement Pertinent to Current Situation/Hospitalization: No - Comment as needed  Activities of Daily Living Home Assistive Devices/Equipment: None ADL Screening (condition at time of admission) Patient's cognitive ability adequate to safely complete daily activities?: Yes Is the patient deaf or have difficulty hearing?: No Does the patient have difficulty seeing, even when wearing glasses/contacts?: No Does the patient have difficulty concentrating, remembering, or making decisions?: No Patient able to express need for assistance with ADLs?: Yes Does the patient have difficulty dressing or bathing?: No Independently performs ADLs?: Yes (appropriate for developmental age) Does the patient have difficulty walking or climbing stairs?: No Weakness of Legs: None Weakness of Arms/Hands: None  Permission Sought/Granted Permission sought to share information with : Case Manager Permission granted to share information with : Yes, Verbal Permission Granted              Emotional Assessment Appearance:: Appears stated age Attitude/Demeanor/Rapport: Gracious Affect (typically observed): Appropriate, Calm Orientation: : Oriented to Self, Oriented to Place, Oriented to  Time, Oriented to Situation Alcohol / Substance Use: Not Applicable Psych Involvement: No (comment)  Admission diagnosis:  Pneumonia [J18.9] Community acquired pneumonia of right middle lobe of lung ([place]) [J18.1] Pulmonary embolism without acute cor pulmonale, unspecified chronicity, unspecified pulmonary embolism type ([place]) [I26.99] Patient Active Problem List   Diagnosis Date Noted  . Sustained SVT ([place])   . Cardiogenic shock ([place])   . Chronic diastolic CHF (congestive  heart failure) ([place]) 12/18/2018  . Elevated troponin 12/18/2018  . HCAP (healthcare-associated pneumonia) 12/17/2018  .  Hemoptysis 12/17/2018  . Pulmonary emboli ([place]) 12/17/2018  . Abdominal pain 08/29/2018  . Hypertension 10/29/2016  . Hypertensive urgency 04/03/2016   PCP:  Fanny Bien, MD Pharmacy:   [place], [place]. [address] [place] [address] Phone: [phone] Fax: [phone]     Social Determinants of Health (SDOH) Interventions    Readmission Risk Interventions 30 Day Unplanned Readmission Risk Score     ED to Hosp-Admission (Current) from 12/17/2018 in [place] ICU  30 Day Unplanned Readmission Risk Score (%)  17 Filed at 12/21/2018 1200     This score is the patient's risk of an unplanned readmission within 30 days of being discharged (0 -100%). The score is based on dignosis, age, lab data, medications, orders, and past utilization.   Low:  0-14.9   Medium: 15-21.9   High: 22-29.9   Extreme: 30 and above       Readmission Risk Prevention Plan 12/22/2018  Transportation Screening Complete  PCP or Specialist Appt within 5-7 Days (No Data)  Home Care Screening Complete  Medication Review (RN CM) Complete  Some recent data might be hidden

## 2018-12-21 NOTE — Progress Notes (Signed)
NAME:  [name] [name], MRN:  [id number], DOB:  Nov 04, 1985, LOS: 4 ADMISSION DATE:  12/17/2018, CONSULTATION DATE:  3/6 REFERRING MD:  [place] , CHIEF COMPLAINT:  SOB    Brief History   33 yr old F w/ presents with SOB found to have right middle and right lower lobe segmental pulmonary emboli with airspace opacities in the same lobes with right pleural effusion. PCCM consulted on the floor for recommendations for AHRF. Today developed SVT/AFRVR. Will be transferred to ICU.  Past Medical History  PMHx Pregnancy induced HTN, condyloma acuminata, morbid obesity  Significant Hospital Events   3/6 Admitted 3/8 SVT requiring ICU transfer for potential cardioversion  Consults:  PCCM 3/6   Procedures:   Significant Diagnostic Tests:  CTA 3/6: Segmental occlusion involving RML and RLL pulmonary artery, RML and RLL GGO and small right pleural effusion TTE 3/7: EF 20-25% compared to EF 60-65% in 2016, reduced RV systolic function, LA dilation, moderate pericardial effusion LE Dopplers 3/8: No LE DVT  ANA reflex pending   Micro Data:  BCX 3/7 > Sputum CX sent  Strep ag: pending  Legionella: pending   Antimicrobials:  Azithro 3/6->> Maxipime 3/7>> Vanc 3/7 >>  Interim history/subjective:  Alert oriented, following all basic commands, states that she is feeling much better.  She does feel like she is improving.  Her cough has less sputum production.  Objective   Blood pressure (!) 143/68, pulse 83, temperature 99.8 F (37.7 C), resp. rate (!) 23, height 5' 3.5" (1.613 m), weight 99.1 kg, SpO2 92 %. CVP:  [8 mmHg-13 mmHg] 8 mmHg      Intake/Output Summary (Last 24 hours) at 12/21/2018 1127 Last data filed at 12/21/2018 1000 Gross per 24 hour  Intake 2378.04 ml  Output 7950 ml  Net -5571.96 ml   Filed Weights   12/20/18 0600 12/21/18 0608 12/21/18 0800  Weight: 105.7 kg 105.5 kg 99.1 kg    Physical Exam: General: Young female, resting in bed, no apparent distress HENT: NCAT,  sclera clear Respiratory: No rhonchi no crackles no wheeze Cardiovascular: Regular rate rhythm, no MRG S1-S2 GI: Soft nontender nondistended bowel sounds present Extremities: Bilateral lower extremity edema Neuro: Oriented x3, following all commands no focal deficit Psych: Normal mood, normal affect   Resolved Hospital Problem list     Assessment & Plan:   Fever, worsening leukocytosis Acute respiratory failure secondary to suspected heart failure, pneumonia and PEs She does have cough and infiltrate consistent with pneumonia  Plan Continue cefepime, azithromycin Will complete total 5 days azithromycin, 7 days cefepime No growth to date with sputum cultures strep pneumo antigen negative Continue Robitussin for cough advance diet as tolerated  Acute systolic heart failure Pericardial effusion SVT s/p adenosine Presumed nonischemic cardiomyopathy diagnosis TTE with EF 20-25% compared to normal EF in 2016. Unclear etiology. ?pna treated in decemeber while in [place] Diuresis per cardiology Institution of heart failure regimen per cardiology Remains in the ICU for close observation.   Right-sided segmental pulmonary emboli LE Dopplers negative Plan Heparin drip, PTT regulation per pharmacy protocol  Community-acquired pneumonia On cefepime and azithromycin  RVAP - negative  Can likely plan for de-escalation antimicrobials tomorrow  Hemoptysis We will observe  Hypomagnesium  Replete as necessary  Best practice:  Diet: NPO Pain/Anxiety/Delirium protocol (if indicated): na VAP protocol (if indicated): na DVT prophylaxis: Hep drip  GI prophylaxis: na  Glucose control: monitor BS  Mobility: OOB as tolerated Code Status: full  Family Communication: Update  patient Disposition: ICU for observation  Labs   CBC: Recent Labs  Lab 12/17/18 2014 12/18/18 0103 12/19/18 0943 12/19/18 1854 12/20/18 0411 12/21/18 0500  WBC 13.7*  --  16.0* 15.7* 12.3*  9.0  HGB 10.5* 12.2 9.2* 9.3* 9.4* 8.2*  HCT 35.9* 36.0 31.6* 31.4* 31.2* 28.4*  MCV 75.9*  --  77.3* 77.1* 76.8* 76.3*  PLT 264  --  226 218 206 607    Basic Metabolic Panel: Recent Labs  Lab 12/17/18 2014 12/18/18 0103 12/18/18 0858 12/18/18 1431 12/19/18 0943 12/20/18 0411 12/21/18 0500  NA 136 136  --  138 135 134* 130*  K 3.2* 5.4*  --  3.2* 3.7 3.9 3.1*  CL 106  --   --  104 104 102 92*  CO2 23  --   --  25 24 22 27   GLUCOSE 120*  --   --  90 89 89 180*  BUN 11  --   --  10 10 9 10   CREATININE 1.33*  --   --  1.26* 1.00 1.17* 1.24*  CALCIUM 8.2*  --   --  7.9* 8.1* 7.6* 8.1*  MG  --   --  1.5*  --  2.0  --   --   PHOS  --   --  3.5  --   --   --   --    GFR: Estimated Creatinine Clearance: 74.5 mL/min (A) (by C-G formula based on SCr of 1.24 mg/dL (H)). Recent Labs  Lab 12/19/18 0943 12/19/18 1854 12/20/18 0411 12/21/18 0500  WBC 16.0* 15.7* 12.3* 9.0    Liver Function Tests: Recent Labs  Lab 12/18/18 1431 12/19/18 0943 12/20/18 0411  AST 25 20 23   ALT 58* 46* 39  ALKPHOS 36* 36* 36*  BILITOT 3.7* 3.4* 1.8*  PROT 5.2* 5.4* 5.5*  ALBUMIN 2.3* 2.1* 2.1*   No results for input(s): LIPASE, AMYLASE in the last 168 hours. No results for input(s): AMMONIA in the last 168 hours.  ABG    Component Value Date/Time   HCO3 23.8 12/18/2018 0103   TCO2 25 12/18/2018 0103   O2SAT 57.3 12/21/2018 0500     Coagulation Profile: No results for input(s): INR, PROTIME in the last 168 hours.  Cardiac Enzymes: Recent Labs  Lab 12/17/18 2215 12/18/18 0858 12/18/18 1431 12/18/18 2034  TROPONINI 0.14* 0.21* 0.21* 0.16*    HbA1C: No results found for: HGBA1C  CBG: No results for input(s): GLUCAP in the last 168 hours.    Garner Nash, DO [facility] 12/21/2018 11:27 AM  Personal pager: [phone] If unanswered, please page CCM On-call: [phone]

## 2018-12-21 NOTE — Progress Notes (Signed)
ANTICOAGULATION CONSULT NOTE  Pharmacy Consult for heparin Indication: pulmonary embolus  No Known Allergies  Patient Measurements: Height: 5' 3.5" (161.3 cm) Weight: 232 lb 9.4 oz (105.5 kg) IBW/kg (Calculated) : 53.55 Heparin Dosing Weight: 78.1  Vital Signs: Temp: 99.2 F (37.3 C) (03/10 0400) Temp Source: Oral (03/10 0400) BP: 149/83 (03/10 0700) Pulse Rate: 83 (03/10 0700)  Labs: Recent Labs    12/18/18 0858  12/18/18 1431 12/18/18 2034  12/19/18 0943 12/19/18 1854 12/20/18 0411 12/21/18 0500  HGB  --   --   --   --    < > 9.2* 9.3* 9.4* 8.2*  HCT  --   --   --   --    < > 31.6* 31.4* 31.2* 28.4*  PLT  --   --   --   --    < > 226 218 206 216  HEPARINUNFRC 0.15*   < >  --  0.31  --  0.19* 0.36 0.35 0.48  CREATININE  --    < > 1.26*  --   --  1.00  --  1.17* 1.24*  TROPONINI 0.21*  --  0.21* 0.16*  --   --   --   --   --    < > = values in this interval not displayed.    Estimated Creatinine Clearance: 77.2 mL/min (A) (by C-G formula based on SCr of 1.24 mg/dL (H)).  Assessment: 33 yo female on IV heparin infusion for acute right middle and right lower lobe segmental pulmonary emboli .  No evidence of right heart strain.  She was not on anticoagulation PTA.  Still having small episodes of hemoptysis.  PCCM noted hemoptysis likely 2/2 PE and/or pulm edema in setting of AC.  Urine pinkish/red per RN.  MD aware and heparin continuing for acute PE.  Heparin level within goal range this AM, no overt bleeding or complications noted.  Hgb slightly down.  Goal of Therapy:  Heparin level 0.3-0.7 units/ml Monitor platelets by anticoagulation protocol: Yes   Plan:  Continue heparin 1900 units/hr Monitor daily heparin level, CBC, and for s/sx of bleeding F/u plan for oral anticoag at time of discharge  Marguerite Olea, [place] Pharmacist Phone [phone]  12/21/2018 7:25 AM

## 2018-12-22 DIAGNOSIS — J9601 Acute respiratory failure with hypoxia: Secondary | ICD-10-CM | POA: Diagnosis not present

## 2018-12-22 DIAGNOSIS — J189 Pneumonia, unspecified organism: Secondary | ICD-10-CM | POA: Diagnosis not present

## 2018-12-22 DIAGNOSIS — N179 Acute kidney failure, unspecified: Secondary | ICD-10-CM | POA: Diagnosis not present

## 2018-12-22 DIAGNOSIS — I313 Pericardial effusion (noninflammatory): Secondary | ICD-10-CM | POA: Diagnosis not present

## 2018-12-22 DIAGNOSIS — I471 Supraventricular tachycardia: Secondary | ICD-10-CM | POA: Diagnosis not present

## 2018-12-22 DIAGNOSIS — R570 Cardiogenic shock: Secondary | ICD-10-CM | POA: Diagnosis not present

## 2018-12-22 DIAGNOSIS — I110 Hypertensive heart disease with heart failure: Secondary | ICD-10-CM | POA: Diagnosis not present

## 2018-12-22 DIAGNOSIS — I2699 Other pulmonary embolism without acute cor pulmonale: Secondary | ICD-10-CM | POA: Diagnosis not present

## 2018-12-22 DIAGNOSIS — Z6841 Body Mass Index (BMI) 40.0 and over, adult: Secondary | ICD-10-CM | POA: Diagnosis not present

## 2018-12-22 DIAGNOSIS — R0602 Shortness of breath: Secondary | ICD-10-CM

## 2018-12-22 LAB — CBC
HCT: 33.8 % — ABNORMAL LOW (ref 36.0–46.0)
Hemoglobin: 10.2 g/dL — ABNORMAL LOW (ref 12.0–15.0)
MCH: 22.5 pg — AB (ref 26.0–34.0)
MCHC: 30.2 g/dL (ref 30.0–36.0)
MCV: 74.6 fL — AB (ref 80.0–100.0)
PLATELETS: 332 10*3/uL (ref 150–400)
RBC: 4.53 MIL/uL (ref 3.87–5.11)
RDW: 16.3 % — ABNORMAL HIGH (ref 11.5–15.5)
WBC: 11.2 10*3/uL — ABNORMAL HIGH (ref 4.0–10.5)
nRBC: 0 % (ref 0.0–0.2)

## 2018-12-22 LAB — BASIC METABOLIC PANEL
Anion gap: 12 (ref 5–15)
BUN: 11 mg/dL (ref 6–20)
CO2: 30 mmol/L (ref 22–32)
Calcium: 8.3 mg/dL — ABNORMAL LOW (ref 8.9–10.3)
Chloride: 90 mmol/L — ABNORMAL LOW (ref 98–111)
Creatinine, Ser: 1.19 mg/dL — ABNORMAL HIGH (ref 0.44–1.00)
GFR calc Af Amer: >60 mL/min (ref >60)
GFR calc non Af Amer: >60 mL/min (ref >60)
Glucose, Bld: 270 mg/dL — ABNORMAL HIGH (ref 70–99)
Potassium: 3.4 mmol/L — ABNORMAL LOW (ref 3.5–5.1)
SODIUM: 132 mmol/L — AB (ref 135–145)

## 2018-12-22 LAB — CULTURE, RESPIRATORY W GRAM STAIN: Culture: NORMAL

## 2018-12-22 LAB — HEPARIN LEVEL (UNFRACTIONATED)
Heparin Unfractionated: 0.29 IU/mL — ABNORMAL LOW (ref 0.30–0.70)
Heparin Unfractionated: 0.45 IU/mL (ref 0.30–0.70)

## 2018-12-22 LAB — COOXEMETRY PANEL
Carboxyhemoglobin: 1.9 % — ABNORMAL HIGH (ref 0.5–1.5)
Methemoglobin: 0.8 % (ref 0.0–1.5)
O2 Saturation: 61.8 %
Total hemoglobin: 10.4 g/dL — ABNORMAL LOW (ref 12.0–16.0)

## 2018-12-22 LAB — LEGIONELLA PNEUMOPHILA SEROGP 1 UR AG: L. PNEUMOPHILA SEROGP 1 UR AG: NEGATIVE

## 2018-12-22 MED ORDER — POTASSIUM CHLORIDE 10 MEQ/50ML IV SOLN
10.0000 meq | INTRAVENOUS | Status: AC
  Administered 2018-12-22 (×4): 10 meq via INTRAVENOUS
  Filled 2018-12-22 (×4): qty 50

## 2018-12-22 MED ORDER — IVABRADINE HCL 5 MG PO TABS
5.0000 mg | ORAL_TABLET | ORAL | Status: AC
  Administered 2018-12-22: 5 mg via ORAL
  Filled 2018-12-22: qty 1

## 2018-12-22 MED ORDER — SODIUM CHLORIDE 0.9 % IV BOLUS
250.0000 mL | Freq: Once | INTRAVENOUS | Status: AC
  Administered 2018-12-22: 250 mL via INTRAVENOUS

## 2018-12-22 MED ORDER — SODIUM CHLORIDE 0.9 % IV SOLN
2.0000 g | INTRAVENOUS | Status: AC
  Administered 2018-12-22 – 2018-12-24 (×3): 2 g via INTRAVENOUS
  Filled 2018-12-22 (×3): qty 20

## 2018-12-22 MED ORDER — POTASSIUM CHLORIDE CRYS ER 20 MEQ PO TBCR
40.0000 meq | EXTENDED_RELEASE_TABLET | Freq: Four times a day (QID) | ORAL | Status: DC
  Administered 2018-12-22: 40 meq via ORAL
  Filled 2018-12-22: qty 2

## 2018-12-22 MED ORDER — CARVEDILOL 6.25 MG PO TABS
6.2500 mg | ORAL_TABLET | Freq: Two times a day (BID) | ORAL | Status: DC
  Administered 2018-12-22 – 2018-12-27 (×10): 6.25 mg via ORAL
  Filled 2018-12-22 (×10): qty 1

## 2018-12-22 MED ORDER — FUROSEMIDE 40 MG PO TABS: 40.0000 mg | ORAL_TABLET | Freq: Two times a day (BID) | ORAL | Status: DC

## 2018-12-22 MED ORDER — CHLORHEXIDINE GLUCONATE CLOTH 2 % EX PADS
6.0000 | MEDICATED_PAD | Freq: Every day | CUTANEOUS | Status: DC
  Administered 2018-12-22 – 2018-12-27 (×6): 6 via TOPICAL

## 2018-12-22 MED ORDER — POTASSIUM CHLORIDE CRYS ER 20 MEQ PO TBCR: 20.0000 meq | EXTENDED_RELEASE_TABLET | Freq: Every day | ORAL | Status: DC

## 2018-12-22 MED ORDER — HYDRALAZINE HCL 25 MG PO TABS: 25.0000 mg | ORAL_TABLET | Freq: Three times a day (TID) | ORAL | Status: DC

## 2018-12-22 MED ORDER — SACUBITRIL-VALSARTAN 24-26 MG PO TABS: 1.0000 | ORAL_TABLET | Freq: Two times a day (BID) | ORAL | Status: DC

## 2018-12-22 NOTE — Progress Notes (Addendum)
Advanced Heart Failure Rounding Note  PCP-Cardiologist: Dorris Carnes, MD   Subjective:    Cr 1.17 -> 1.24 -> 1.19. K 3.4. Tmax 102.6 yesterday am. Primary adjusting ABX.   CVP 7. Negative 2.9 L and down 8 lbs (22 total from highest weight).   Feeling OK this am. Breathing improved. Feverish still yesterday.   Coox 61.8%.   Objective:   Weight Range: 95.3 kg Body mass index is 36.65 kg/m.   Vital Signs:   Temp:  [97.7 F (36.5 C)-102.6 F (39.2 C)] 99.7 F (37.6 C) (03/11 0300) Pulse Rate:  [71-93] 85 (03/11 0600) Resp:  [19-37] 27 (03/11 0600) BP: (60-158)/(41-76) 133/66 (03/11 0600) SpO2:  [92 %-100 %] 95 % (03/11 0600) Weight:  [95.3 kg-99.1 kg] 95.3 kg (03/11 0300) Last BM Date: (PTA)  Weight change: Filed Weights   12/21/18 0608 12/21/18 0800 12/22/18 0300  Weight: 105.5 kg 99.1 kg 95.3 kg    Intake/Output:   Intake/Output Summary (Last 24 hours) at 12/22/2018 0716 Last data filed at 12/22/2018 0600 Gross per 24 hour  Intake 2387.34 ml  Output 5500 ml  Net -3112.66 ml    Physical Exam    CVP 7  General: NAD HEENT: Normal Neck: Supple. JVP 6-7 cm. Carotids 2+ bilat; no bruits. No thyromegaly or nodule noted. Cor: PMI nondisplaced. RRR, No M/G/R noted Lungs: CTAB, normal effort. Abdomen: Soft, non-tender, non-distended, no HSM. No bruits or masses. +BS  Extremities: No cyanosis, clubbing, or rash.  Trace ankle edema.  Neuro: Alert & orientedx3, cranial nerves grossly intact. moves all 4 extremities w/o difficulty. Affect pleasant   Telemetry   NSR 80-90s, personally reviewed.   EKG    No new tracings.    Labs    CBC Recent Labs    12/21/18 0500 12/22/18 0312  WBC 9.0 11.2*  HGB 8.2* 10.2*  HCT 28.4* 33.8*  MCV 76.3* 74.6*  PLT 216 081   Basic Metabolic Panel Recent Labs    12/19/18 0943  12/21/18 1409 12/22/18 0312  NA 135   < > 130* 132*  K 3.7   < > 3.2* 3.4*  CL 104   < > 92* 90*  CO2 24   < > 26 30  GLUCOSE 89   < >  174* 270*  BUN 10   < > 11 11  CREATININE 1.00   < > 1.41* 1.19*  CALCIUM 8.1*   < > 7.9* 8.3*  MG 2.0  --   --   --    < > = values in this interval not displayed.   Liver Function Tests Recent Labs    12/19/18 0943 12/20/18 0411  AST 20 23  ALT 46* 39  ALKPHOS 36* 36*  BILITOT 3.4* 1.8*  PROT 5.4* 5.5*  ALBUMIN 2.1* 2.1*   No results for input(s): LIPASE, AMYLASE in the last 72 hours. Cardiac Enzymes No results for input(s): CKTOTAL, CKMB, CKMBINDEX, TROPONINI in the last 72 hours.  BNP: BNP (last 3 results) Recent Labs    12/17/18 2014 12/19/18 0943  BNP 836.8* 192.4*    ProBNP (last 3 results) Recent Labs    12/16/18 1150  PROBNP 3,613*     D-Dimer No results for input(s): DDIMER in the last 72 hours. Hemoglobin A1C No results for input(s): HGBA1C in the last 72 hours. Fasting Lipid Panel No results for input(s): CHOL, HDL, LDLCALC, TRIG, CHOLHDL, LDLDIRECT in the last 72 hours. Thyroid Function Tests No results for input(s): TSH,  T4TOTAL, T3FREE, THYROIDAB in the last 72 hours.  Invalid input(s): FREET3  Other results:   Imaging    No results found.   Medications:     Scheduled Medications: . carvedilol  3.125 mg Oral BID WC  . Chlorhexidine Gluconate Cloth  6 each Topical Daily  . hydrALAZINE  12.5 mg Oral Q8H  . isosorbide mononitrate  60 mg Oral Daily  . potassium chloride  20 mEq Oral Daily  . sacubitril-valsartan  1 tablet Oral BID  . sodium chloride flush  10-40 mL Intracatheter Q12H  . sodium chloride flush  3 mL Intravenous Q12H  . spironolactone  25 mg Oral Daily    Infusions: . sodium chloride 10 mL/hr at 12/22/18 0600  . amiodarone 30 mg/hr (12/22/18 0600)  . azithromycin 500 mg (12/21/18 1043)  . ceFEPime (MAXIPIME) IV 2 g (12/22/18 0606)  . furosemide (LASIX) infusion 10 mg/hr (12/22/18 0600)  . heparin 2,100 Units/hr (12/22/18 0600)    PRN Medications: sodium chloride, acetaminophen, albuterol,  guaiFENesin-codeine, hydrALAZINE, morphine injection, ondansetron **OR** ondansetron (ZOFRAN) IV, oxyCODONE, sodium chloride flush, sodium chloride flush    Patient Profile   [name] [name] is a 33 y.o. female with h/o pregnancy induced HTN and suspected diastolic CHF. Admitted with worsening SOB and hemoptysis. Found to have acute PE +/- PNA with fever.   CHF team asked to see with marked volume overload and new systolic CHF.   Assessment/Plan   1. Acute systolic CHF - Suspect NICM in setting of HTN, though family history of earlier CAD.  - Echo 12/18/18 LVEF 20-25%, Severe LAE, Moderately reduced RV, Mod pericardial effusion (circumferential).  - Volume status improving. CVP 6-7   - Stop lasix gtt and change to lasix 40 mg BID this evening.  - No ARB/ACE for now with AKI.  - Continue imdur 60 mg daily  - Increase hydralazine to 25 mg TID - Continue Entresto 24/26 mg BID. BP had dropped yesterday, but improved today.  - Continue spiro 25 mg daily. - Increase Coreg to 6.25 mg bid.  - Follow K .  - Given low suspicion for coronary disease, will try to arrange for coronary CT angiogram => will need HR lower, will give 1 dose of ivabradine.   2. Acute PE - Chest CTA demonstrated acute segmental right middle and right lower lobe pulmonary emboli without obvious right heart strain. - Continue heparin for now in case needs cath. No change.   - Transition to Eliquis after any procedures.   3. HTN - Labile. Meds as above.   4. PNA - By chest CT. - On cefepime per primary. Azithro added back.  - Blood Cx NGTD.    5. Paroxysmal atrial fibrillation/SVT - Noted on tele.  - Remains on IV amio at 30 mg/hr.  - This patients CHA2DS2-VASc Score is at least 3.   6. Moderate pericardial effusion - Follow with diuresis. No tamponade physiology. No change.   7. Suspected OSA - Will need sleep study as outpatient. No change.   8. Morbid obesity - Body mass index is 36.65 kg/m.  -  Will need encouraged weight loss as outpatient.   9. Hypokalemia - K 3.4. Supp and continue to follow with diuresis.   Medication concerns reviewed with patient and pharmacy team. Barriers identified: Questionable compliance.   Length of Stay: [address]  Annamaria Helling  12/22/2018, 7:16 AM  Advanced Heart Failure Team Pager [phone] (M-F; 7a - 4p)  Please contact [facility] for night-coverage  after hours (4p -7a ) and weekends on [email or web address]  Patient seen with PA, agree with the above note.   She diuresed well yesterday.  CVP 7 this morning, co-ox 61%.  Creatinine stable. Feeling better.   On exam, no JVD. Decreased BS on right. Regular S1S2. Trace ankle edema.   1. Acute on chronic systolic CHF: She has a cardiomyopathy of uncertain etiology. EF was reportedly low on echo at hospital in [facility] in 12/19. This admission, EF 20-25% with moderate RV systolic dysfunction. As above, no recent pregnancy so doubt peri-partum CMP. Cannot rule out viral myocarditis as cause or long-standing uncontrolled HTN. She has diuresed well, now CVP 7 with co-ox 61%.  - Think low probability of coronary disease, will plan coronary CTA today if we are able to lower her BP appropriately with a dose of ivabradine (will use 1 dose only given recent atrial fibrillation).   - Stop Lasix gtt, start Lasix 40 mg bid.  - Continue Imdur 30 daily and increase hydralazine to 25 mg tid.  - Continue spironolactone 25 mg daily.  - Increase Coreg to 6.25 mg bid.   - Continue Entresto 24/26 bid.   2. Pulmonary embolus: On right. She says that she has not been moving much at all for weeks due to exertional dyspnea. She has no family history of VTE.  - Continue heparin gtt for now pending procedures. Transition to Eliquis eventually.  3. Atrial fibrillation: Paroxysmal, currently in NSR. No prior history. Likely triggered by CHF and PE.  - Continue heparin gtt for now, eventually Eliquis.  -  Continue amiodarone gtt for the time being.  4. PNA: Chest CT suggestive of PNA, not pulmonary infarction. Tm 102.7 yesterday but afebrile this morning, WBCs coming down.  - Continue cefepime/azithromycin per primary service.  5. Moderate pericardial effusion: Diurese. No tamponade.   Out of bed.   Loralie Champagne 12/22/2018 7:42 AM

## 2018-12-22 NOTE — Progress Notes (Signed)
NAME:  [name] [name], MRN:  [id number], DOB:  1986/04/07, LOS: 5 ADMISSION DATE:  12/17/2018, CONSULTATION DATE:  3/6 REFERRING MD:  [place] , CHIEF COMPLAINT:  SOB    Brief History   33 yr old F w/ presents with SOB found to have right middle and right lower lobe segmental pulmonary emboli with airspace opacities in the same lobes with right pleural effusion. PCCM consulted on the floor for recommendations for AHRF. Today developed SVT/AFRVR. Will be transferred to ICU.  Past Medical History  PMHx Pregnancy induced HTN, condyloma acuminata, morbid obesity  Significant Hospital Events   3/6 Admitted 3/8 SVT requiring ICU transfer for potential cardioversion  Consults:  PCCM 3/6   Procedures:   Significant Diagnostic Tests:  CTA 3/6: Segmental occlusion involving RML and RLL pulmonary artery, RML and RLL GGO and small right pleural effusion TTE 3/7: EF 20-25% compared to EF 60-65% in 2016, reduced RV systolic function, LA dilation, moderate pericardial effusion LE Dopplers 3/8: No LE DVT  ANA reflex pending   Micro Data:  BCX 3/7 > Sputum CX sent  Strep ag: pending  Legionella: pending   Antimicrobials:  Azithro 3/6->> Maxipime 3/7>> Vanc 3/7 >>  Interim history/subjective:  Feeling better this morning. Cough improved. She is sleeping better. Still had a fever last night.   Objective   Blood pressure 132/63, pulse 93, temperature (!) 97.2 F (36.2 C), temperature source Oral, resp. rate (!) 21, height 5' 3.5" (1.613 m), weight 95.3 kg, SpO2 95 %. CVP:  [3 mmHg-8 mmHg] 3 mmHg      Intake/Output Summary (Last 24 hours) at 12/22/2018 6834 Last data filed at 12/22/2018 0930 Gross per 24 hour  Intake 2419.48 ml  Output 6200 ml  Net -3780.52 ml   Filed Weights   12/21/18 0608 12/21/18 0800 12/22/18 0300  Weight: 105.5 kg 99.1 kg 95.3 kg    Physical Exam: General: young fm, resting in bed no distress HENT: NCAT, sclera clear Respiratory: CTAB, no crackles   Cardiovascular: RRR, S1 S2, no mrg  GI: soft, nt, nd  Extremities: BL LE edema  Neuro: AAOX3, no deficit  Psych: normal mood and affect   Resolved Hospital Problem list     Assessment & Plan:   Acute respiratory failure secondary to suspected heart failure, pulmonary edema, pneumonia and pulmonary emboli She does have cough and infiltrate consistent with pneumonia  Plan De-escalated abx, complete 5 days azithro and 7 days of cephaloosporin, de-escalated to ceftriaxone Cultures no NGTD Overall improving  Robitussin for cough  Acute systolic heart failure Pericardial effusion SVT s/p adenosine Presumed nonischemic cardiomyopathy diagnosis TTE with EF 20-25% compared to normal EF in 2016. Unclear etiology. ?pna treated in decemeber while in [place] HF regimen per HF team  Discussed with HF team and they would like for patient to remain in unit for another day    Right-sided segmental pulmonary emboli LE Dopplers negative Plan Heparin ggt per pharmacy   Community-acquired pneumonia RVAP - negative   Hemoptysis Observe, resolved at this time   Hypomagnesium  Likely stable for transfer from ICU tomorrow   Best practice:  Diet: NPO Pain/Anxiety/Delirium protocol (if indicated): na VAP protocol (if indicated): na DVT prophylaxis: Hep drip  GI prophylaxis: na  Glucose control: monitor BS  Mobility: OOB as tolerated Code Status: full  Family Communication: Update patient Disposition: ICU for observation  Labs   CBC: Recent Labs  Lab 12/19/18 0943 12/19/18 1854 12/20/18 0411 12/21/18 0500 12/22/18 1962  WBC 16.0* 15.7* 12.3* 9.0 11.2*  HGB 9.2* 9.3* 9.4* 8.2* 10.2*  HCT 31.6* 31.4* 31.2* 28.4* 33.8*  MCV 77.3* 77.1* 76.8* 76.3* 74.6*  PLT 226 218 206 216 174    Basic Metabolic Panel: Recent Labs  Lab 12/18/18 0858  12/19/18 0943 12/20/18 0411 12/21/18 0500 12/21/18 1409 12/22/18 0312  NA  --    < > 135 134* 130* 130* 132*  K  --     < > 3.7 3.9 3.1* 3.2* 3.4*  CL  --    < > 104 102 92* 92* 90*  CO2  --    < > 24 22 27 26 30   GLUCOSE  --    < > 89 89 180* 174* 270*  BUN  --    < > 10 9 10 11 11   CREATININE  --    < > 1.00 1.17* 1.24* 1.41* 1.19*  CALCIUM  --    < > 8.1* 7.6* 8.1* 7.9* 8.3*  MG 1.5*  --  2.0  --   --   --   --   PHOS 3.5  --   --   --   --   --   --    < > = values in this interval not displayed.   GFR: Estimated Creatinine Clearance: 76 mL/min (A) (by C-G formula based on SCr of 1.19 mg/dL (H)). Recent Labs  Lab 12/19/18 1854 12/20/18 0411 12/21/18 0500 12/22/18 0312  WBC 15.7* 12.3* 9.0 11.2*    Liver Function Tests: Recent Labs  Lab 12/18/18 1431 12/19/18 0943 12/20/18 0411  AST 25 20 23   ALT 58* 46* 39  ALKPHOS 36* 36* 36*  BILITOT 3.7* 3.4* 1.8*  PROT 5.2* 5.4* 5.5*  ALBUMIN 2.3* 2.1* 2.1*   No results for input(s): LIPASE, AMYLASE in the last 168 hours. No results for input(s): AMMONIA in the last 168 hours.  ABG    Component Value Date/Time   HCO3 23.8 12/18/2018 0103   TCO2 25 12/18/2018 0103   O2SAT 61.8 12/22/2018 0325     Coagulation Profile: No results for input(s): INR, PROTIME in the last 168 hours.  Cardiac Enzymes: Recent Labs  Lab 12/17/18 2215 12/18/18 0858 12/18/18 1431 12/18/18 2034  TROPONINI 0.14* 0.21* 0.21* 0.16*    HbA1C: No results found for: HGBA1C  CBG: No results for input(s): GLUCAP in the last 168 hours.    Garner Nash, DO [facility] 12/22/2018 9:39 AM  Personal pager: [phone] If unanswered, please page CCM On-call: [phone]

## 2018-12-22 NOTE — Progress Notes (Signed)
ANTICOAGULATION CONSULT NOTE - Follow Up Consult  Pharmacy Consult for heparin Indication: pulmonary embolus  Labs: Recent Labs    12/20/18 0411 12/21/18 0500 12/21/18 1409 12/22/18 0312  HGB 9.4* 8.2*  --  10.2*  HCT 31.2* 28.4*  --  33.8*  PLT 206 216  --  332  HEPARINUNFRC 0.35 0.48  --  0.29*  CREATININE 1.17* 1.24* 1.41* 1.19*    Assessment: 33yo female now subtherapeutic on heparin after several levels at goal; no gtt issues or signs of bleeding (other than "usual" hemoptysis) per RN.  Goal of Therapy:  Heparin level 0.3-0.7 units/ml   Plan:  Will increase heparin gtt by 10% to 2100 units/hr and check level in 6 hours.    Wynona Neat, PharmD, BCPS  12/22/2018,4:17 AM

## 2018-12-22 NOTE — Progress Notes (Signed)
Patient worked with PT around [place]. Patient stood at bedside and walked in place; then began complaining of feeling nauseated and cold sweats. BP appeared highly elevated on her right lower leg, but was quite low when checked on her right upper arm. Patient appears symptomatic. Oda Kilts, PA notified and came to unit to assess patient. New orders received and implemented.  Joellen Jersey, RN

## 2018-12-22 NOTE — Progress Notes (Signed)
[place] for heparin Indication: pulmonary embolus  No Known Allergies  Patient Measurements: Height: 5' 3.5" (161.3 cm) Weight: 210 lb 3.2 oz (95.3 kg) IBW/kg (Calculated) : 53.55 Heparin Dosing Weight: 78.1  Vital Signs: Temp: 98 F (36.7 C) (03/11 1119) Temp Source: Oral (03/11 1119) BP: 79/48 (03/11 1141) Pulse Rate: 74 (03/11 1141)  Labs: Recent Labs    12/20/18 0411 12/21/18 0500 12/21/18 1409 12/22/18 0312 12/22/18 1054  HGB 9.4* 8.2*  --  10.2*  --   HCT 31.2* 28.4*  --  33.8*  --   PLT 206 216  --  332  --   HEPARINUNFRC 0.35 0.48  --  0.29* 0.45  CREATININE 1.17* 1.24* 1.41* 1.19*  --     Estimated Creatinine Clearance: 76 mL/min (A) (by C-G formula based on SCr of 1.19 mg/dL (H)).  . sodium chloride Stopped (12/22/18 0920)  . amiodarone 30 mg/hr (12/22/18 1000)  . cefTRIAXone (ROCEPHIN)  IV 2 g (12/22/18 1131)  . heparin 2,100 Units/hr (12/22/18 1000)  . potassium chloride       Assessment: 33 yo female on IV heparin infusion for acute right middle and right lower lobe segmental pulmonary emboli .  No evidence of right heart strain.  She was not on anticoagulation PTA.  Still having small episodes of hemoptysis.  PCCM noted hemoptysis likely 2/2 PE and/or pulm edema in setting of AC.  Urine pinkish/red per RN.  MD aware and heparin continuing for acute PE.  Heparin level within goal range this AM, no overt bleeding or complications noted.  Hgb back up today, Pltc stable.  Goal of Therapy:  Heparin level 0.3-0.7 units/ml Monitor platelets by anticoagulation protocol: Yes   Plan:  Continue heparin 2100 units/hr Monitor daily heparin level, CBC, and for s/sx of bleeding F/u plan for oral anticoag after cath lab eventually.  Marguerite Olea, [facility] Clinical Pharmacist Phone [phone]  12/22/2018 12:00 PM

## 2018-12-22 NOTE — Progress Notes (Signed)
  Paged for low BP.   SBP now 70-80s with moving BP cuff from LE to UE.   Pt has been N/V for ~ 20 minutes.  Hasn't received 2nd dose of corlanor.   Check BP personally, manually with systolic 80; CVP 3-4.   Hold evening dose of Entresto. Hold hydralazine for now.   Will give 250 cc back and continue with corlanor and Coronary CT as tolerated.    Discussed all above with Dr. Tamala Julian "[address] [place], [place] 12/22/2018 12:22 PM

## 2018-12-22 NOTE — Progress Notes (Signed)
Physical Therapy Treatment Patient Details Name: [name] [name] MRN: [id number] DOB: 11-17-1985 Today's Date: 12/22/2018    History of Present Illness Pt is a 33 y.o. female admitted with worsening SOB and hemoptysis. Chest CTA with R lobe pulmonary emboli +/- PNA. Developed SVT/afib with RVR requiring transfer to ICU. Also with acute systolic CHF; suspect NICM in setting of HTN. PMH includes pregnancy-induced HTN.   PT Comments    Pt slowly progressing with mobility. All mobility continues to take significant increased time due to pt's c/o pain, coughing and fatigue. Pt inconsistent with effort and assist level required. Amb 5' with RW and intermittent minA due to knee buckling and instability; pt requiring frequent cues to keep eyes open and participate. C/o sweating and vision getting blurry while walking; further mobility deferred and RN notified.   BP taken from lower leg: Post-ambulation BP 168/32 Seated additional 3 minutes BP 158/32    Follow Up Recommendations  Home health PT;Supervision/Assistance - 24 hour(pending progress)     Equipment Recommendations  (TBD)    Recommendations for Other Services       Precautions / Restrictions Precautions Precautions: Fall Precaution Comments: Watch BP Restrictions Weight Bearing Restrictions: No    Mobility  Bed Mobility Overal bed mobility: Needs Assistance Bed Mobility: Supine to Sit     Supine to sit: HOB elevated;Supervision     General bed mobility comments: Use of bed rail; significant increased time and effort taking multiple rest breaks with c/o pain and to suction while coughing; no physical assist required  Transfers Overall transfer level: Needs assistance Equipment used: Rolling walker (2 wheeled) Transfers: Sit to/from Stand Sit to Stand: Min assist         General transfer comment: Significant increased time and effort requiring max encouragement to stand; minA to maintain balance with transition of  hands to RW  Ambulation/Gait Ambulation/Gait assistance: Min assist;+2 safety/equipment(chair follow) Gait Distance (Feet): 48 Feet Assistive device: Rolling walker (2 wheeled) Gait Pattern/deviations: Step-to pattern;Decreased stride length;Shuffle;Wide base of support;Trunk flexed Gait velocity: Decreased Gait velocity interpretation: <1.31 ft/sec, indicative of household ambulator General Gait Details: Very slow, unstable gait with RW. True gait mechanics difficult to assess as pt moaning with c/o back pain, intermittently buckling and leaning posteriorly requiring minA to prevent LOB. Cues to increase gait speed as pt spending prolonged time in SLS leading to knee buckling   Stairs             Wheelchair Mobility    Modified Rankin (Stroke Patients Only)       Balance Overall balance assessment: Needs assistance Sitting-balance support: No upper extremity supported;Feet supported Sitting balance-Leahy Scale: Fair Sitting balance - Comments: EOB   Standing balance support: Bilateral upper extremity supported;During functional activity Standing balance-Leahy Scale: Poor Standing balance comment: Reliant on UE support                            Cognition Arousal/Alertness: Awake/alert Behavior During Therapy: WFL for tasks assessed/performed Overall Cognitive Status: Within Functional Limits for tasks assessed                                 General Comments: Internally distracted by pain; moves better when distracted by task/conversation. Potentially some self-limiting      Exercises      General Comments General comments (skin integrity, edema, etc.): BP taken with cuff on lower  leg, measured 189/158 post-ambulation. RN notified      Pertinent Vitals/Pain Pain Assessment: Faces Pain Score: 5  Faces Pain Scale: Hurts even more Pain Location: BAck Pain Descriptors / Indicators: Discomfort;Moaning;Grimacing;Guarding Pain  Intervention(s): Limited activity within patient's tolerance;Monitored during session    Home Living                      Prior Function            PT Goals (current goals can now be found in the care plan section) Acute Rehab PT Goals Patient Stated Goal: to get better and get home to son PT Goal Formulation: With patient Time For Goal Achievement: 01/03/19 Potential to Achieve Goals: Good Progress towards PT goals: Progressing toward goals    Frequency    Min 3X/week      PT Plan Current plan remains appropriate    Co-evaluation PT/OT/SLP Co-Evaluation/Treatment: Yes Reason for Co-Treatment: Complexity of the patient's impairments (multi-system involvement);For patient/therapist safety;To address functional/ADL transfers;Necessary to address cognition/behavior during functional activity PT goals addressed during session: Mobility/safety with mobility;Balance;Proper use of DME OT goals addressed during session: ADL's and self-care;Proper use of Adaptive equipment and DME      AM-PAC PT "6 Clicks" Mobility   Outcome Measure  Help needed turning from your back to your side while in a flat bed without using bedrails?: A Little Help needed moving from lying on your back to sitting on the side of a flat bed without using bedrails?: A Little Help needed moving to and from a bed to a chair (including a wheelchair)?: A Little Help needed standing up from a chair using your arms (e.g., wheelchair or bedside chair)?: A Little Help needed to walk in hospital room?: A Little Help needed climbing 3-5 steps with a railing? : A Lot 6 Click Score: 17    End of Session Equipment Utilized During Treatment: Gait belt Activity Tolerance: Patient limited by fatigue Patient left: in chair;with call bell/phone within reach;with family/visitor present Nurse Communication: Mobility status PT Visit Diagnosis: Difficulty in walking, not elsewhere classified (R26.2);Muscle weakness  (generalized) (M62.81)     Time: 4287-6811 PT Time Calculation (min) (ACUTE ONLY): 39 min  Charges:  $Gait Training: 8-22 mins $Therapeutic Activity: 8-22 mins                    Mabeline Caras, PT, DPT Acute Rehabilitation Services  Pager [phone] Office [place] 12/22/2018, 1:25 PM

## 2018-12-22 NOTE — Progress Notes (Signed)
Occupational Therapy Treatment Patient Details Name: [name] [name] MRN: [id number] DOB: Nov 01, 1985 Today's Date: 12/22/2018    History of present illness 33 yr old F w/ presents with SOB found to have right middle and right lower lobe segmental pulmonary emboli with airspace opacities in the same lobes with right pleural effusion, developed SVT/AFRVR.   OT comments  Pt progressing towards OT goals this session. Bed mobility continues to take significantly increased time due to pain and coughing- but she is able to complete at min guard level. Pt displaying inconsistent ability to stand (able to perform better when distracted by tasks or talking to brother) Pt able to perform standing grooming tasks with support from RW at min guard EOB. Pt frequently closing eyes with ambulation and required cues for safety. Pt complaining that her vision was getting blurry. BP after return to chair was 168/132, and then 158/132 after sitting for 3 min (vision clearing at this point). RN aware and notifying MD. OT will continue to follow acutely.    Follow Up Recommendations  No OT follow up;Supervision/Assistance - 24 hour(monitor for progress if not improving by next session will need post acute OT)    Equipment Recommendations  3 in 1 bedside commode    Recommendations for Other Services      Precautions / Restrictions Restrictions Weight Bearing Restrictions: No       Mobility Bed Mobility Overal bed mobility: Needs Assistance Bed Mobility: Supine to Sit     Supine to sit: Min guard;HOB elevated     General bed mobility comments: heavy use of rails, increased time and effort requiring several rest breaks, but able to perform without physical assist  Transfers Overall transfer level: Needs assistance Equipment used: Rolling walker (2 wheeled) Transfers: Sit to/from Stand Sit to Stand: Min assist;From elevated surface         General transfer comment: min A for balance and vc for safe  hand placement, increased time required    Balance Overall balance assessment: Needs assistance Sitting-balance support: No upper extremity supported;Feet supported Sitting balance-Leahy Scale: Fair Sitting balance - Comments: EOB   Standing balance support: Bilateral upper extremity supported;During functional activity Standing balance-Leahy Scale: Poor Standing balance comment: dependent on external support                           ADL either performed or assessed with clinical judgement   ADL Overall ADL's : Needs assistance/impaired                     Lower Body Dressing: Maximal assistance;Sitting/lateral leans Lower Body Dressing Details (indicate cue type and reason): assist for donning socks EOB Toilet Transfer: Min guard;+2 for safety/equipment;RW Toilet Transfer Details (indicate cue type and reason): vc for safe hand placement         Functional mobility during ADLs: Minimal assistance;Cueing for safety;Cueing for sequencing;Rolling walker;+2 for safety/equipment General ADL Comments: decreased balance, activity tolerance     Vision   Vision Assessment?: Vision impaired- to be further tested in functional context Additional Comments: Pt reported increased blurriness with ambulation, resolving as she sat in the chair   Perception     Praxis      Cognition Arousal/Alertness: Awake/alert Behavior During Therapy: WFL for tasks assessed/performed Overall Cognitive Status: Within Functional Limits for tasks assessed  Exercises     Shoulder Instructions       General Comments BP elevated at end of session post-ambulation    Pertinent Vitals/ Pain       Pain Assessment: 0-10 Pain Score: 5  Pain Location: R chest Pain Descriptors / Indicators: Discomfort;Moaning;Constant;Shooting Pain Intervention(s): Monitored during session;Repositioned  Home Living                                           Prior Functioning/Environment              Frequency  Min 3X/week        Progress Toward Goals  OT Goals(current goals can now be found in the care plan section)  Progress towards OT goals: Progressing toward goals  Acute Rehab OT Goals Patient Stated Goal: to get better and get home to son OT Goal Formulation: With patient Time For Goal Achievement: 01/03/19 Potential to Achieve Goals: Good  Plan Discharge plan remains appropriate;Frequency remains appropriate    Co-evaluation    PT/OT/SLP Co-Evaluation/Treatment: Yes Reason for Co-Treatment: Complexity of the patient's impairments (multi-system involvement);For patient/therapist safety;To address functional/ADL transfers;Necessary to address cognition/behavior during functional activity PT goals addressed during session: Mobility/safety with mobility;Proper use of DME;Strengthening/ROM OT goals addressed during session: ADL's and self-care;Proper use of Adaptive equipment and DME      AM-PAC OT "6 Clicks" Daily Activity     Outcome Measure   Help from another person eating meals?: None Help from another person taking care of personal grooming?: A Little Help from another person toileting, which includes using toliet, bedpan, or urinal?: A Lot Help from another person bathing (including washing, rinsing, drying)?: A Little Help from another person to put on and taking off regular upper body clothing?: A Little Help from another person to put on and taking off regular lower body clothing?: A Lot 6 Click Score: 17    End of Session Equipment Utilized During Treatment: Gait belt;Rolling walker  OT Visit Diagnosis: Unsteadiness on feet (R26.81);Other abnormalities of gait and mobility (R26.89);Muscle weakness (generalized) (M62.81);Pain Pain - Right/Left: Right Pain - part of body: (chest)   Activity Tolerance Patient limited by fatigue   Patient Left in chair;with call  bell/phone within reach   Nurse Communication Mobility status;Other (comment)(elevated BP, visual changes)        Time: 5732-2025 OT Time Calculation (min): 40 min  Charges: OT General Charges $OT Visit: 1 Visit OT Treatments $Therapeutic Activity: 8-22 mins  Hulda Humphrey OTR/L Acute Rehabilitation Services Pager: [phone] Office: [place] 12/22/2018, 11:40 AM

## 2018-12-22 NOTE — Progress Notes (Signed)
Pharmacy Antibiotic Note  [name] [name] is a 33 y.o. female admitted on 12/17/2018 with pneumonia. Initially on vancomycin and cefepime, then changed to azithromycin + cefepime, and today narrowed to ceftriaxone and azithromycin.  Cultures remain negative.  Febrile yesterday, but afebrile so far today.  Plan: Ceftriaxone 2 g q 24 hrs x 3 days Azithromycin ends tomorrow. Continue to follow cultures, renal function and fever curve.  Height: 5' 3.5" (161.3 cm) Weight: 210 lb 3.2 oz (95.3 kg) IBW/kg (Calculated) : 53.55  Temp (24hrs), Avg:99.2 F (37.3 C), Min:97.2 F (36.2 C), Max:102.6 F (39.2 C)  Recent Labs  Lab 12/19/18 0943 12/19/18 1854 12/20/18 0411 12/21/18 0500 12/21/18 1409 12/22/18 0312  WBC 16.0* 15.7* 12.3* 9.0  --  11.2*  CREATININE 1.00  --  1.17* 1.24* 1.41* 1.19*    Estimated Creatinine Clearance: 76 mL/min (A) (by C-G formula based on SCr of 1.19 mg/dL (H)).    No Known Allergies  Antimicrobials this admission: Ceftriaxone 3/7 x1; 3/11 >  Cefepime 3/7>>3/11 Azithro 3/7>> 3/8; 3/9 > (3/12) Vanc 3/7>>3/8  Dose adjustments this admission: n/a  Microbiology results: 3/7 BCx: ngtd 3/8 BCx x 2 >  3/8 MRSA screen neg 3/8 Resp virus panel neg 3/9 Sputum - pending  Marguerite Olea, [facility] Clinical Pharmacist Phone [phone]  12/22/2018 9:47 AM

## 2018-12-23 ENCOUNTER — Inpatient Hospital Stay (HOSPITAL_COMMUNITY): Payer: 59

## 2018-12-23 DIAGNOSIS — I5032 Chronic diastolic (congestive) heart failure: Secondary | ICD-10-CM

## 2018-12-23 DIAGNOSIS — J189 Pneumonia, unspecified organism: Secondary | ICD-10-CM | POA: Diagnosis present

## 2018-12-23 DIAGNOSIS — I5042 Chronic combined systolic (congestive) and diastolic (congestive) heart failure: Secondary | ICD-10-CM | POA: Diagnosis present

## 2018-12-23 DIAGNOSIS — I5021 Acute systolic (congestive) heart failure: Secondary | ICD-10-CM

## 2018-12-23 DIAGNOSIS — J181 Lobar pneumonia, unspecified organism: Secondary | ICD-10-CM

## 2018-12-23 LAB — CBC
HCT: 32.9 % — ABNORMAL LOW (ref 36.0–46.0)
HEMOGLOBIN: 9.9 g/dL — AB (ref 12.0–15.0)
MCH: 22.5 pg — ABNORMAL LOW (ref 26.0–34.0)
MCHC: 30.1 g/dL (ref 30.0–36.0)
MCV: 74.8 fL — ABNORMAL LOW (ref 80.0–100.0)
Platelets: 296 10*3/uL (ref 150–400)
RBC: 4.4 MIL/uL (ref 3.87–5.11)
RDW: 16.3 % — ABNORMAL HIGH (ref 11.5–15.5)
WBC: 10 10*3/uL (ref 4.0–10.5)
nRBC: 0 % (ref 0.0–0.2)

## 2018-12-23 LAB — BASIC METABOLIC PANEL
Anion gap: 9 (ref 5–15)
BUN: 11 mg/dL (ref 6–20)
CO2: 25 mmol/L (ref 22–32)
Calcium: 8.1 mg/dL — ABNORMAL LOW (ref 8.9–10.3)
Chloride: 100 mmol/L (ref 98–111)
Creatinine, Ser: 1.08 mg/dL — ABNORMAL HIGH (ref 0.44–1.00)
GFR calc Af Amer: >60 mL/min (ref >60)
Glucose, Bld: 85 mg/dL (ref 70–99)
Potassium: 4 mmol/L (ref 3.5–5.1)
Sodium: 134 mmol/L — ABNORMAL LOW (ref 135–145)

## 2018-12-23 LAB — CULTURE, BLOOD (ROUTINE X 2)
Culture: NO GROWTH
Culture: NO GROWTH
Special Requests: ADEQUATE

## 2018-12-23 LAB — COOXEMETRY PANEL
Carboxyhemoglobin: 1.7 % — ABNORMAL HIGH (ref 0.5–1.5)
Methemoglobin: 1 % (ref 0.0–1.5)
O2 Saturation: 62.2 %
Total hemoglobin: 10 g/dL — ABNORMAL LOW (ref 12.0–16.0)

## 2018-12-23 LAB — HEPARIN LEVEL (UNFRACTIONATED): Heparin Unfractionated: 0.64 IU/mL (ref 0.30–0.70)

## 2018-12-23 MED ORDER — NITROGLYCERIN 0.4 MG SL SUBL
0.8000 mg | SUBLINGUAL_TABLET | Freq: Once | SUBLINGUAL | Status: AC
  Administered 2018-12-23: 0.8 mg via SUBLINGUAL

## 2018-12-23 MED ORDER — NITROGLYCERIN 0.4 MG SL SUBL
SUBLINGUAL_TABLET | SUBLINGUAL | Status: AC
  Filled 2018-12-23: qty 2

## 2018-12-23 MED ORDER — ISOSORBIDE MONONITRATE ER 30 MG PO TB24: 30.0000 mg | ORAL_TABLET | Freq: Every day | ORAL | Status: DC

## 2018-12-23 MED ORDER — IOHEXOL 350 MG/ML SOLN
100.0000 mL | Freq: Once | INTRAVENOUS | Status: AC | PRN
  Administered 2018-12-23: 90 mL via INTRAVENOUS

## 2018-12-23 MED ORDER — SACUBITRIL-VALSARTAN 24-26 MG PO TABS
1.0000 | ORAL_TABLET | Freq: Two times a day (BID) | ORAL | Status: DC
  Administered 2018-12-23 – 2018-12-26 (×7): 1 via ORAL
  Filled 2018-12-23 (×7): qty 1

## 2018-12-23 MED ORDER — IVABRADINE HCL 5 MG PO TABS
5.0000 mg | ORAL_TABLET | Freq: Once | ORAL | Status: AC
  Administered 2018-12-23: 5 mg via ORAL
  Filled 2018-12-23: qty 1

## 2018-12-23 MED ORDER — FUROSEMIDE 40 MG PO TABS
40.0000 mg | ORAL_TABLET | Freq: Two times a day (BID) | ORAL | Status: DC
  Administered 2018-12-23 – 2018-12-24 (×2): 40 mg via ORAL
  Filled 2018-12-23 (×2): qty 1

## 2018-12-23 MED ORDER — SPIRONOLACTONE 25 MG PO TABS
25.0000 mg | ORAL_TABLET | Freq: Every day | ORAL | Status: DC
  Administered 2018-12-23 – 2018-12-26 (×4): 25 mg via ORAL
  Filled 2018-12-23 (×4): qty 1

## 2018-12-23 NOTE — Progress Notes (Signed)
[place] for heparin Indication: pulmonary embolus  No Known Allergies  Patient Measurements: Height: 5' 3.5" (161.3 cm) Weight: 210 lb 12.2 oz (95.6 kg)(pt refused standing scale) IBW/kg (Calculated) : 53.55 Heparin Dosing Weight: 78.1  Vital Signs: Temp: 98.2 F (36.8 C) (03/12 0726) Temp Source: Oral (03/12 0726) BP: 113/66 (03/12 1000) Pulse Rate: 75 (03/12 1000)  Labs: Recent Labs    12/21/18 0500 12/21/18 1409 12/22/18 0312 12/22/18 1054 12/23/18 0257  HGB 8.2*  --  10.2*  --  9.9*  HCT 28.4*  --  33.8*  --  32.9*  PLT 216  --  332  --  296  HEPARINUNFRC 0.48  --  0.29* 0.45 0.64  CREATININE 1.24* 1.41* 1.19*  --  1.08*    Estimated Creatinine Clearance: 83.9 mL/min (A) (by C-G formula based on SCr of 1.08 mg/dL (H)).  . sodium chloride Stopped (12/22/18 0920)  . amiodarone 30 mg/hr (12/23/18 1000)  . cefTRIAXone (ROCEPHIN)  IV Stopped (12/22/18 1201)  . heparin 2,100 Units/hr (12/23/18 1000)     Assessment: 33 yo female on IV heparin infusion for acute right middle and right lower lobe segmental pulmonary emboli .  No evidence of right heart strain.  She was not on anticoagulation PTA.  Still having small episodes of hemoptysis.  PCCM noted hemoptysis likely 2/2 PE and/or pulm edema in setting of AC.  Urine pinkish/red per RN.  MD aware and heparin continuing for acute PE.  Heparin level remains within goal range this AM, no overt bleeding or complications noted.  Hgb ok, Pltc stable.  Goal of Therapy:  Heparin level 0.3-0.7 units/ml Monitor platelets by anticoagulation protocol: Yes   Plan:  Continue heparin 2100 units/hr Monitor daily heparin level, CBC, and for s/sx of bleeding F/u plan for oral anticoag after MRI/cath lab eventually.  Marguerite Olea, [facility] Clinical Pharmacist Phone [phone]  12/23/2018 10:28 AM

## 2018-12-23 NOTE — Progress Notes (Deleted)
  Cardiology Office Note:    Date:  12/23/2018   ID:  [name] [name], DOB 30-Jun-1986, MRN [id number]  PCP:  Fanny Bien, MD  Cardiologist:  Dorris Carnes, MD *** Electrophysiologist:  None   Referring MD: Lutricia Feil*   No chief complaint on file. ***  History of Present Illness:    [name] [name] is a 33 y.o. female with hypertension, ***  Ms. [name] [name] ***  Prior CV studies:   The following studies were reviewed today:  *** Echo 10/11/15 Mod LVH, EF 60-65, no RWMA, mild LAE   Past Medical History:  Diagnosis Date  . Condyloma acuminata   . HTN in pregnancy, chronic 09/24/2015   [place] labs: [x ]Cr, [x]  24hr protein (208) P:Cr 92 Plat: 207 AST/ALT: 10/10    . Hypertension   . Obesity in pregnancy    Antepartum, first trimester  . URI (upper respiratory infection)    Surgical Hx: The patient  has a past surgical history that includes Cesarean section (N/A, 02/25/2016) and BIRTH CONTROL IMPLANT (Left, 03/2016).   Current Medications: No outpatient medications have been marked as taking for the 12/24/18 encounter (Appointment) with Richardson Dopp T, PA-C.     Allergies:   Patient has no known allergies.   Social History   Tobacco Use  . Smoking status: Never Smoker  . Smokeless tobacco: Never Used  Substance Use Topics  . Alcohol use: Never    Frequency: Never  . Drug use: Never     Family Hx: The patient's family history includes Heart disease in her mother; Hypertension in her mother.  ROS:   Please see the history of present illness.    ROS All other systems reviewed and are negative.   EKGs/Labs/Other Test Reviewed:    EKG:  EKG is *** ordered today.  The ekg ordered today demonstrates ***  Recent Labs: 12/16/2018: NT-Pro BNP 3,613 12/18/2018: TSH 1.236 12/19/2018: B Natriuretic Peptide 192.4; Magnesium 2.0 12/20/2018: ALT 39 12/23/2018: BUN 11; Creatinine, Ser 1.08; Hemoglobin 9.9; Platelets 296; Potassium 4.0; Sodium 134   Recent  Lipid Panel Lab Results  Component Value Date/Time   CHOL 193 11/04/2016 09:26 AM   TRIG 103 11/04/2016 09:26 AM   HDL 36 (L) 11/04/2016 09:26 AM   CHOLHDL 5.4 (H) 11/04/2016 09:26 AM   LDLCALC 136 (H) 11/04/2016 09:26 AM    Physical Exam:    VS:  There were no vitals taken for this visit.    Wt Readings from Last 3 Encounters:  12/23/18 210 lb 12.2 oz (95.6 kg)  12/16/18 236 lb 12.8 oz (107.4 kg)  08/29/18 233 lb 14.5 oz (106.1 kg)     ***Physical Exam  ASSESSMENT & PLAN:    No diagnosis found.***  Dispo:  No follow-ups on file.   Medication Adjustments/Labs and Tests Ordered: Current medicines are reviewed at length with the patient today.  Concerns regarding medicines are outlined above.  Tests Ordered: No orders of the defined types were placed in this encounter.  Medication Changes: No orders of the defined types were placed in this encounter.   Signed, Richardson Dopp, PA-C  12/23/2018 4:31 PM    [facility], [place],   [address] Phone: [phone]; Fax: [phone]

## 2018-12-23 NOTE — Progress Notes (Signed)
NAME:  [name] [name], MRN:  [id number], DOB:  1986-03-24, LOS: 6 ADMISSION DATE:  12/17/2018, CONSULTATION DATE:  3/6 REFERRING MD:  [place], CHIEF COMPLAINT:  dyspnea   Brief History   33 y/o female admitted on 3/6 with fever, dyspnea. Found to have a PE and new systolic heart failure.   Past Medical History  Pregnancy induced hypertension Condyloma acuminata Morbid obesity  Significant Hospital Events   3/6 Admitted 3/8 SVT requiring ICU transfer for potential cardioversion  Consults:  PCCM 3/6   Procedures:    Significant Diagnostic Tests:  CTA 3/6: Segmental occlusion involving RML and RLL pulmonary artery, RML and RLL GGO and small right pleural effusion TTE 3/7: EF 20-25% compared to EF 60-65% in 2016, reduced RV systolic function, LA dilation, moderate pericardial effusion LE Dopplers 3/8: No LE DVT  Micro Data:  BCX 3/7 > negative BC 3/8 > no growth 3/8 resp viral panel > negative 3/9 Sputum CX > oral flora  Strep ag 3/9 : negative Legionella: negative  Antimicrobials:  Azithro 3/6->> 3/12 Maxipime 3/7>>3/10 Vanc 3/7 >> 3/8 Ceftriaxone 3/11 >    Interim history/subjective:  Feels better Eating Wants to get out of bed  Objective   Blood pressure 114/74, pulse 75, temperature 98.2 F (36.8 C), temperature source Oral, resp. rate 11, height 5' 3.5" (1.613 m), weight 95.6 kg, SpO2 96 %. CVP:  [9 mmHg-12 mmHg] 9 mmHg      Intake/Output Summary (Last 24 hours) at 12/23/2018 1101 Last data filed at 12/23/2018 1000 Gross per 24 hour  Intake 1918.71 ml  Output 1100 ml  Net 818.71 ml   Filed Weights   12/21/18 0800 12/22/18 0300 12/23/18 0600  Weight: 99.1 kg 95.3 kg 95.6 kg    Examination:  General:  Resting comfortably in bed HENT: NCAT OP clear PULM: CTA B, normal effort CV: RRR, no mgr GI: BS+, soft, nontender MSK: normal bulk and tone Neuro: awake, alert, no distress, MAEW     Resolved Hospital Problem list   Acute respiratory  failure with hypoxemia  Assessment & Plan:   Acute systolic heart failure: appears euvolemic > diuresis per heart failure > cardiac imaging per cardiology  CAP > complete 7 days total cephalosporin  Pulmonary embolism, birth control related Intermittent hemopsysi > heparin drip per pharmacy > needs 6 months anticoag > prefer eliquis, tomorrow if only scant hemoptysis  Move out of ICU   Best practice:  Diet: regular diet Pain/Anxiety/Delirium protocol (if indicated): n/a VAP protocol (if indicated): n/a DVT prophylaxis: hep drip GI prophylaxis: n/a Glucose control: yes Mobility: out of bed Code Status: full Family Communication: none bedside Disposition: TRH, SDU  Labs   CBC: Recent Labs  Lab 12/19/18 1854 12/20/18 0411 12/21/18 0500 12/22/18 0312 12/23/18 0257  WBC 15.7* 12.3* 9.0 11.2* 10.0  HGB 9.3* 9.4* 8.2* 10.2* 9.9*  HCT 31.4* 31.2* 28.4* 33.8* 32.9*  MCV 77.1* 76.8* 76.3* 74.6* 74.8*  PLT 218 206 216 332 485    Basic Metabolic Panel: Recent Labs  Lab 12/18/18 0858  12/19/18 0943 12/20/18 0411 12/21/18 0500 12/21/18 1409 12/22/18 0312 12/23/18 0257  NA  --    < > 135 134* 130* 130* 132* 134*  K  --    < > 3.7 3.9 3.1* 3.2* 3.4* 4.0  CL  --    < > 104 102 92* 92* 90* 100  CO2  --    < > 24 22 27 26 30 25   GLUCOSE  --    < >  89 89 180* 174* 270* 85  BUN  --    < > 10 9 10 11 11 11   CREATININE  --    < > 1.00 1.17* 1.24* 1.41* 1.19* 1.08*  CALCIUM  --    < > 8.1* 7.6* 8.1* 7.9* 8.3* 8.1*  MG 1.5*  --  2.0  --   --   --   --   --   PHOS 3.5  --   --   --   --   --   --   --    < > = values in this interval not displayed.   GFR: Estimated Creatinine Clearance: 83.9 mL/min (A) (by C-G formula based on SCr of 1.08 mg/dL (H)). Recent Labs  Lab 12/20/18 0411 12/21/18 0500 12/22/18 0312 12/23/18 0257  WBC 12.3* 9.0 11.2* 10.0    Liver Function Tests: Recent Labs  Lab 12/18/18 1431 12/19/18 0943 12/20/18 0411  AST 25 20 23   ALT 58*  46* 39  ALKPHOS 36* 36* 36*  BILITOT 3.7* 3.4* 1.8*  PROT 5.2* 5.4* 5.5*  ALBUMIN 2.3* 2.1* 2.1*   No results for input(s): LIPASE, AMYLASE in the last 168 hours. No results for input(s): AMMONIA in the last 168 hours.  ABG    Component Value Date/Time   HCO3 23.8 12/18/2018 0103   TCO2 25 12/18/2018 0103   O2SAT 62.2 12/23/2018 0520     Coagulation Profile: No results for input(s): INR, PROTIME in the last 168 hours.  Cardiac Enzymes: Recent Labs  Lab 12/17/18 2215 12/18/18 0858 12/18/18 1431 12/18/18 2034  TROPONINI 0.14* 0.21* 0.21* 0.16*    HbA1C: No results found for: HGBA1C  CBG: No results for input(s): GLUCAP in the last 168 hours.     Critical care time: n/a    Roselie Awkward, MD [facility] Pager: [phone] Cell: [phone] If no response, call [phone]

## 2018-12-23 NOTE — Progress Notes (Addendum)
Advanced Heart Failure Rounding Note  PCP-Cardiologist: Dorris Carnes, MD   Subjective:    Cr 1.17 -> 1.24 -> 1.19 -> 1.08. K 4.0.  Tmax 99.5.   CVP 4-5. I/O positive 467 cc. Weight stable.   Feeling better this am. Denies any further N/V.   Coox 62%  Coronary CT deferred with hypotension.   Objective:   Weight Range: 95.6 kg Body mass index is 36.75 kg/m.   Vital Signs:   Temp:  [97.2 F (36.2 C)-99.5 F (37.5 C)] 99.5 F (37.5 C) (03/12 0000) Pulse Rate:  [70-93] 88 (03/12 0700) Resp:  [14-39] 21 (03/12 0700) BP: (78-158)/(48-132) 126/80 (03/12 0700) SpO2:  [94 %-99 %] 96 % (03/12 0700) Weight:  [95.6 kg] 95.6 kg (03/12 0600) Last BM Date: 12/21/18  Weight change: Filed Weights   12/21/18 0800 12/22/18 0300 12/23/18 0600  Weight: 99.1 kg 95.3 kg 95.6 kg    Intake/Output:   Intake/Output Summary (Last 24 hours) at 12/23/2018 0715 Last data filed at 12/23/2018 0700 Gross per 24 hour  Intake 2117.13 ml  Output 1650 ml  Net 467.13 ml    Physical Exam    CVP 4-5  General: NAD  HEENT: Normal Neck: Supple. JVP not elevated. Carotids 2+ bilat; no bruits. No thyromegaly or nodule noted. Cor: PMI nondisplaced. RRR, No M/G/R noted Lungs: CTAB, normal effort. Abdomen: Soft, non-tender, non-distended, no HSM. No bruits or masses. +BS  Extremities: No cyanosis, clubbing, or rash. R and LLE no edema.  Neuro: Alert & orientedx3, cranial nerves grossly intact. moves all 4 extremities w/o difficulty. Affect pleasant   Telemetry   NSR 80-90s, personally reviewed.   EKG    No new tracings.    Labs    CBC Recent Labs    12/22/18 0312 12/23/18 0257  WBC 11.2* 10.0  HGB 10.2* 9.9*  HCT 33.8* 32.9*  MCV 74.6* 74.8*  PLT 332 921   Basic Metabolic Panel Recent Labs    12/22/18 0312 12/23/18 0257  NA 132* 134*  K 3.4* 4.0  CL 90* 100  CO2 30 25  GLUCOSE 270* 85  BUN 11 11  CREATININE 1.19* 1.08*  CALCIUM 8.3* 8.1*   Liver Function Tests No  results for input(s): AST, ALT, ALKPHOS, BILITOT, PROT, ALBUMIN in the last 72 hours. No results for input(s): LIPASE, AMYLASE in the last 72 hours. Cardiac Enzymes No results for input(s): CKTOTAL, CKMB, CKMBINDEX, TROPONINI in the last 72 hours.  BNP: BNP (last 3 results) Recent Labs    12/17/18 2014 12/19/18 0943  BNP 836.8* 192.4*    ProBNP (last 3 results) Recent Labs    12/16/18 1150  PROBNP 3,613*     D-Dimer No results for input(s): DDIMER in the last 72 hours. Hemoglobin A1C No results for input(s): HGBA1C in the last 72 hours. Fasting Lipid Panel No results for input(s): CHOL, HDL, LDLCALC, TRIG, CHOLHDL, LDLDIRECT in the last 72 hours. Thyroid Function Tests No results for input(s): TSH, T4TOTAL, T3FREE, THYROIDAB in the last 72 hours.  Invalid input(s): FREET3  Other results:   Imaging    No results found.   Medications:     Scheduled Medications: . carvedilol  6.25 mg Oral BID WC  . Chlorhexidine Gluconate Cloth  6 each Topical Daily  . isosorbide mononitrate  60 mg Oral Daily  . potassium chloride  20 mEq Oral Daily  . sodium chloride flush  10-40 mL Intracatheter Q12H  . sodium chloride flush  3 mL Intravenous  Q12H  . spironolactone  25 mg Oral Daily    Infusions: . sodium chloride Stopped (12/22/18 0920)  . amiodarone 30 mg/hr (12/23/18 0700)  . cefTRIAXone (ROCEPHIN)  IV Stopped (12/22/18 1201)  . heparin 2,100 Units/hr (12/23/18 0700)    PRN Medications: sodium chloride, acetaminophen, albuterol, guaiFENesin-codeine, hydrALAZINE, morphine injection, ondansetron **OR** ondansetron (ZOFRAN) IV, oxyCODONE, sodium chloride flush, sodium chloride flush    Patient Profile   [name] [name] is a 33 y.o. female with h/o pregnancy induced HTN and suspected diastolic CHF. Admitted with worsening SOB and hemoptysis. Found to have acute PE +/- PNA with fever.   CHF team asked to see with marked volume overload and new systolic CHF.    Assessment/Plan   1. Acute systolic CHF - Suspect NICM in setting of HTN, though family history of earlier CAD.  - Echo 12/18/18 LVEF 20-25%, Severe LAE, Moderately reduced RV, Mod pericardial effusion (circumferential).  - Volume status improved. CVP 4-5 - Consider po lasix as CVP comes back up.  - No ARB/ACE for now with AKI.  - Continue imdur 60 mg daily  - Continue hydralazine 25 mg TID - Will resume Entresto 24/26 mg BID s/p Cor CT.  - Continue spiro 25 mg daily. - Continue Coreg 6.25 mg bid.  - Follow K .  - Given low suspicion for coronary disease, will try to arrange for coronary CT angiogram => will need HR lower, will give 1 dose of ivabradine.   2. Acute PE - Chest CTA demonstrated acute segmental right middle and right lower lobe pulmonary emboli without obvious right heart strain. - Continue heparin for now in case needs cath. No change.    - Transition to Eliquis after any procedures.   3. HTN - Meds as above.   4. PNA - By chest CT. - On cefepime per primary. Azithro added back.  - Blood Cx NGTD.    5. Paroxysmal atrial fibrillation/SVT - Noted on tele.  - Remains on IV amio at 30 mg/hr.  - This patients CHA2DS2-VASc Score is at least 3.   6. Moderate pericardial effusion - Follow with diuresis. No tamponade physiology. No change.   7. Suspected OSA - Will need sleep study as outpatient. No change.   8. Morbid obesity - Body mass index is 36.75 kg/m.  - Will need encouraged weight loss as outpatient.   9. Hypokalemia - K 4.0. She has had a lot of nausea with potassium supplements.   Medication concerns reviewed with patient and pharmacy team. Barriers identified: Questionable compliance.   Length of Stay: [address]  Annamaria Helling  12/23/2018, 7:15 AM  Advanced Heart Failure Team Pager [phone] (M-F; 7a - 4p)  Please contact [facility] for night-coverage after hours (4p -7a ) and weekends on [email or web address]  Patient seen with PA,  agree with the above note.   CVP 4-5 this morning with co-ox 62%. Creatinine stable. Feeling better.   On exam, no JVD. Decreased BS on right. Regular S1S2. Trace ankle edema.   1. Acute on chronic systolic CHF: She has a cardiomyopathy of uncertain etiology. EF was reportedly low on echo at hospital in [facility] in 12/19. This admission, EF 20-25% with moderate RV systolic dysfunction. As above, no recent pregnancy so doubt peri-partum CMP. Cannot rule out viral myocarditis as cause or long-standing uncontrolled HTN.She has diuresed well, now CVP 4-5 with co-ox 62%. - Think low probability of coronary disease, will plan coronary CTA today if we are able to  lower her HR appropriately with a dose of ivabradine (will use 1 dose only given recent atrial fibrillation).   -Start Lasix 40 mg daily this evening.  - Hold hydralazine/Imdur until after coronary CT to make sure BP does not drop and prevent CT like yesterday.  -Continue spironolactone25 mg daily.  - Continue Coreg 6.25 mg bid.   -Continue Entresto 24/26 bid, will give dose after she gets back from CT. 2. Pulmonary embolus: On right. She says that she has not been moving much at all for weeks due to exertional dyspnea. She has no family history of VTE.  - Continue heparin gtt for now pending procedures. Transition to Eliquis eventually.  3. Atrial fibrillation: Paroxysmal, currently in NSR. No prior history. Likely triggered by CHF and PE.  - Continue heparin gtt for now, eventually Eliquis.  - Continue amiodarone gtt until after CT then to po.  4. PNA: Chest CT suggestive of PNA, not pulmonary infarction.Now afebrile.  - Continue cefepime/azithromycinper primary service.  5. Moderate pericardial effusion: Diurese. No tamponade.  She may go to stepdown.   Loralie Champagne 12/23/2018 7:40 AM

## 2018-12-24 ENCOUNTER — Ambulatory Visit: Payer: 59 | Admitting: Physician Assistant

## 2018-12-24 ENCOUNTER — Inpatient Hospital Stay (HOSPITAL_COMMUNITY): Payer: 59

## 2018-12-24 DIAGNOSIS — I429 Cardiomyopathy, unspecified: Secondary | ICD-10-CM

## 2018-12-24 LAB — CBC
HCT: 32.1 % — ABNORMAL LOW (ref 36.0–46.0)
Hemoglobin: 9.6 g/dL — ABNORMAL LOW (ref 12.0–15.0)
MCH: 22.6 pg — ABNORMAL LOW (ref 26.0–34.0)
MCHC: 29.9 g/dL — ABNORMAL LOW (ref 30.0–36.0)
MCV: 75.5 fL — ABNORMAL LOW (ref 80.0–100.0)
Platelets: 303 10*3/uL (ref 150–400)
RBC: 4.25 MIL/uL (ref 3.87–5.11)
RDW: 16.7 % — AB (ref 11.5–15.5)
WBC: 8.4 10*3/uL (ref 4.0–10.5)
nRBC: 0 % (ref 0.0–0.2)

## 2018-12-24 LAB — BASIC METABOLIC PANEL
Anion gap: 9 (ref 5–15)
BUN: 11 mg/dL (ref 6–20)
CO2: 23 mmol/L (ref 22–32)
CREATININE: 1.01 mg/dL — AB (ref 0.44–1.00)
Calcium: 8.3 mg/dL — ABNORMAL LOW (ref 8.9–10.3)
Chloride: 104 mmol/L (ref 98–111)
GFR calc Af Amer: >60 mL/min (ref >60)
GFR calc non Af Amer: >60 mL/min (ref >60)
Glucose, Bld: 96 mg/dL (ref 70–99)
Potassium: 3.9 mmol/L (ref 3.5–5.1)
SODIUM: 136 mmol/L (ref 135–145)

## 2018-12-24 LAB — COOXEMETRY PANEL
Carboxyhemoglobin: 1.6 % — ABNORMAL HIGH (ref 0.5–1.5)
Methemoglobin: 1.4 % (ref 0.0–1.5)
O2 Saturation: 87.2 %
Total hemoglobin: 9.8 g/dL — ABNORMAL LOW (ref 12.0–16.0)

## 2018-12-24 LAB — CULTURE, BLOOD (ROUTINE X 2)
Culture: NO GROWTH
Culture: NO GROWTH
SPECIAL REQUESTS: ADEQUATE
Special Requests: ADEQUATE

## 2018-12-24 LAB — HEPARIN LEVEL (UNFRACTIONATED): Heparin Unfractionated: 0.67 IU/mL (ref 0.30–0.70)

## 2018-12-24 MED ORDER — ELIQUIS 5 MG VTE STARTER PACK: ORAL_TABLET | ORAL | 0 refills | Status: DC

## 2018-12-24 MED ORDER — OXYCODONE HCL 5 MG PO TABS: 5.0000 mg | ORAL_TABLET | Freq: Four times a day (QID) | ORAL | Status: DC | PRN

## 2018-12-24 MED ORDER — APIXABAN 5 MG PO TABS: 5.0000 mg | ORAL_TABLET | Freq: Two times a day (BID) | ORAL | Status: DC

## 2018-12-24 MED ORDER — ISOSORB DINITRATE-HYDRALAZINE 20-37.5 MG PO TABS: 0.5000 | ORAL_TABLET | Freq: Three times a day (TID) | ORAL | 3 refills | Status: DC

## 2018-12-24 MED ORDER — HEPARIN (PORCINE) 25000 UT/250ML-% IV SOLN
2100.0000 [IU]/h | INTRAVENOUS | Status: DC
  Administered 2018-12-24 – 2018-12-25 (×2): 2100 [IU]/h via INTRAVENOUS
  Filled 2018-12-24 (×2): qty 250

## 2018-12-24 MED ORDER — AMIODARONE HCL 200 MG PO TABS: 200.0000 mg | ORAL_TABLET | Freq: Two times a day (BID) | ORAL | Status: DC

## 2018-12-24 MED ORDER — AMIODARONE HCL 200 MG PO TABS: 200.0000 mg | ORAL_TABLET | Freq: Every day | ORAL | Status: DC

## 2018-12-24 MED ORDER — AMIODARONE HCL 200 MG PO TABS
400.0000 mg | ORAL_TABLET | Freq: Two times a day (BID) | ORAL | Status: DC
  Administered 2018-12-24 – 2018-12-27 (×7): 400 mg via ORAL
  Filled 2018-12-24 (×7): qty 2

## 2018-12-24 MED ORDER — ACETAMINOPHEN 325 MG PO TABS: 650.0000 mg | ORAL_TABLET | Freq: Four times a day (QID) | ORAL | Status: DC | PRN

## 2018-12-24 MED ORDER — SACUBITRIL-VALSARTAN 24-26 MG PO TABS: 1.0000 | ORAL_TABLET | Freq: Two times a day (BID) | ORAL | 3 refills | Status: DC

## 2018-12-24 MED ORDER — AMIODARONE HCL 200 MG PO TABS
400.0000 mg | ORAL_TABLET | Freq: Two times a day (BID) | ORAL | Status: DC
  Administered 2018-12-24: 400 mg via ORAL
  Filled 2018-12-24: qty 2

## 2018-12-24 MED ORDER — APIXABAN 5 MG PO TABS
10.0000 mg | ORAL_TABLET | Freq: Two times a day (BID) | ORAL | Status: DC
  Administered 2018-12-25 – 2018-12-27 (×5): 10 mg via ORAL
  Filled 2018-12-24 (×5): qty 2

## 2018-12-24 MED ORDER — SPIRONOLACTONE 25 MG PO TABS: 25.0000 mg | ORAL_TABLET | Freq: Every day | ORAL | 3 refills | Status: DC

## 2018-12-24 MED ORDER — APIXABAN 5 MG PO TABS
10.0000 mg | ORAL_TABLET | Freq: Two times a day (BID) | ORAL | Status: DC
  Filled 2018-12-24: qty 2

## 2018-12-24 MED ORDER — FUROSEMIDE 40 MG PO TABS
40.0000 mg | ORAL_TABLET | Freq: Every day | ORAL | Status: DC
  Administered 2018-12-25 – 2018-12-27 (×3): 40 mg via ORAL
  Filled 2018-12-24 (×3): qty 1

## 2018-12-24 MED ORDER — TRAMADOL HCL 50 MG PO TABS: 50.0000 mg | ORAL_TABLET | Freq: Four times a day (QID) | ORAL | Status: DC | PRN

## 2018-12-24 MED ORDER — ISOSORB DINITRATE-HYDRALAZINE 20-37.5 MG PO TABS
0.5000 | ORAL_TABLET | Freq: Three times a day (TID) | ORAL | Status: DC
  Administered 2018-12-24 – 2018-12-25 (×4): 0.5 via ORAL
  Filled 2018-12-24 (×4): qty 1

## 2018-12-24 MED ORDER — GADOBUTROL 1 MMOL/ML IV SOLN
10.0000 mL | Freq: Once | INTRAVENOUS | Status: AC | PRN
  Administered 2018-12-24: 10 mL via INTRAVENOUS

## 2018-12-24 MED ORDER — AMIODARONE HCL 200 MG PO TABS: 400.0000 mg | ORAL_TABLET | Freq: Two times a day (BID) | ORAL | 3 refills | Status: DC

## 2018-12-24 MED ORDER — CARVEDILOL 6.25 MG PO TABS: 6.2500 mg | ORAL_TABLET | Freq: Two times a day (BID) | ORAL | 3 refills | Status: DC

## 2018-12-24 MED ORDER — ALBUTEROL SULFATE (2.5 MG/3ML) 0.083% IN NEBU: 2.5000 mg | INHALATION_SOLUTION | RESPIRATORY_TRACT | Status: DC | PRN

## 2018-12-24 MED ORDER — MORPHINE SULFATE (PF) 2 MG/ML IV SOLN: 2.0000 mg | INTRAVENOUS | Status: DC | PRN

## 2018-12-24 NOTE — Progress Notes (Signed)
Submitted for benefits check for [employer] early this am and awaiting result.

## 2018-12-24 NOTE — Progress Notes (Signed)
Physical Therapy Treatment Patient Details Name: [name] [name] MRN: [id number] DOB: 13-Jul-1986 Today's Date: 12/24/2018    History of Present Illness Pt is a 33 y.o. female admitted with worsening SOB and hemoptysis. Chest CTA with R lobe pulmonary emboli +/- PNA. Developed SVT/afib with RVR requiring transfer to ICU. Also with acute systolic CHF; suspect NICM in setting of HTN. PMH includes pregnancy-induced HTN.    PT Comments    Pt received in recliner. States she feels stronger today. She required min assist sit to stand and min assist ambulation 100 feet with RW. +2 assist utilized for chair follow. Pt able to ambulate 50 feet out and then return ambulate 50 feet without use of recliner. Pt positioned in recliner with feet elevated at end of session.    Follow Up Recommendations  Home health PT;Supervision/Assistance - 24 hour     Equipment Recommendations  Rolling walker with 5" wheels    Recommendations for Other Services       Precautions / Restrictions Precautions Precautions: Fall;Other (comment) Precaution Comments: Watch BP Restrictions Weight Bearing Restrictions: No    Mobility  Bed Mobility Overal bed mobility: Modified Independent             General bed mobility comments: Pt received in recliner and returned to recliner.   Transfers Overall transfer level: Needs assistance Equipment used: Rolling walker (2 wheeled) Transfers: Sit to/from Stand Sit to Stand: Min assist         General transfer comment: cues for sequencing  Ambulation/Gait Ambulation/Gait assistance: Min assist;+2 safety/equipment Gait Distance (Feet): 100 Feet Assistive device: Rolling walker (2 wheeled) Gait Pattern/deviations: Step-through pattern;Decreased stride length Gait velocity: very slow, catious gait Gait velocity interpretation: <1.31 ft/sec, indicative of household ambulator General Gait Details: intermittent knee buckling noted with min assist and RW support to  maintain stance. +2 assist utilized for chair follow   Stairs             Wheelchair Mobility    Modified Rankin (Stroke Patients Only)       Balance Overall balance assessment: Needs assistance Sitting-balance support: No upper extremity supported;Feet supported Sitting balance-Leahy Scale: Fair Sitting balance - Comments: EOB   Standing balance support: Bilateral upper extremity supported;During functional activity Standing balance-Leahy Scale: Poor Standing balance comment: Reliant on UE support                            Cognition Arousal/Alertness: Awake/alert Behavior During Therapy: WFL for tasks assessed/performed Overall Cognitive Status: Within Functional Limits for tasks assessed                                 General Comments: pt reports she feels "stronger and better" this date.       Exercises      General Comments General comments (skin integrity, edema, etc.): 171/128 sitting EOB taken on lower leg, 198/180 standing after 81min taken on lower leg;nsg administered BP medication prior to session, nsg approved mobility        Pertinent Vitals/Pain Pain Assessment: No/denies pain Pain Intervention(s): Monitored during session    Home Living                      Prior Function            PT Goals (current goals can now be found in the care plan section) Acute  Rehab PT Goals Patient Stated Goal: to get home to her son PT Goal Formulation: With patient Time For Goal Achievement: 01/03/19 Potential to Achieve Goals: Good Progress towards PT goals: Progressing toward goals    Frequency    Min 3X/week      PT Plan Current plan remains appropriate    Co-evaluation              AM-PAC PT "6 Clicks" Mobility   Outcome Measure  Help needed turning from your back to your side while in a flat bed without using bedrails?: None Help needed moving from lying on your back to sitting on the side of a  flat bed without using bedrails?: A Little Help needed moving to and from a bed to a chair (including a wheelchair)?: A Little Help needed standing up from a chair using your arms (e.g., wheelchair or bedside chair)?: A Little Help needed to walk in hospital room?: A Little Help needed climbing 3-5 steps with a railing? : A Lot 6 Click Score: 18    End of Session Equipment Utilized During Treatment: Gait belt Activity Tolerance: Patient tolerated treatment well Patient left: in chair;with call bell/phone within reach Nurse Communication: Mobility status PT Visit Diagnosis: Difficulty in walking, not elsewhere classified (R26.2);Muscle weakness (generalized) (M62.81)     Time: 0630-1601 PT Time Calculation (min) (ACUTE ONLY): 19 min  Charges:  $Gait Training: 8-22 mins                     [name] [name], PT  Office # [phone] Pager [phone]    [name] [name] 12/24/2018, 12:52 PM

## 2018-12-24 NOTE — Progress Notes (Signed)
[place] for heparin Indication: pulmonary embolus  No Known Allergies  Patient Measurements: Height: 5' 3.5" (161.3 cm) Weight: 210 lb 12.2 oz (95.6 kg)(pt refused standing scale) IBW/kg (Calculated) : 53.55 Heparin Dosing Weight: 78.1  Vital Signs: Temp: 98.3 F (36.8 C) (03/13 0814) Temp Source: Oral (03/13 0814) BP: 128/89 (03/13 0814) Pulse Rate: 77 (03/13 0814)  Labs: Recent Labs    12/22/18 0312 12/22/18 1054 12/23/18 0257 12/24/18 0258  HGB 10.2*  --  9.9* 9.6*  HCT 33.8*  --  32.9* 32.1*  PLT 332  --  296 303  HEPARINUNFRC 0.29* 0.45 0.64 0.67  CREATININE 1.19*  --  1.08* 1.01*    Estimated Creatinine Clearance: 89.7 mL/min (A) (by C-G formula based on SCr of 1.01 mg/dL (H)).  . sodium chloride Stopped (12/22/18 0920)  . cefTRIAXone (ROCEPHIN)  IV 2 g (12/24/18 0454)  . heparin 2,100 Units/hr (12/24/18 0946)     Assessment: 33 yo female on IV heparin infusion for acute right middle and right lower lobe segmental pulmonary emboli .  No evidence of right heart strain.  She was not on anticoagulation PTA.  Still having small episodes of hemoptysis.  PCCM noted hemoptysis likely 2/2 PE and/or pulm edema in setting of AC.   Heparin level remains within goal range this AM.  Hgb ok, Pltc stable.  Goal of Therapy:  Heparin level 0.3-0.7 units/ml Monitor platelets by anticoagulation protocol: Yes   Plan:  Continue heparin 2100 units/hr for one more day Planning to switch to Eliquis tomorrow.  Hopefully hemoptysis will resolve soon. Monitor daily heparin level, CBC, and for s/sx of bleeding  Marguerite Olea, [facility] Clinical Pharmacist Phone [phone]  12/24/2018 9:58 AM

## 2018-12-24 NOTE — Progress Notes (Addendum)
Advanced Heart Failure Rounding Note  PCP-Cardiologist: Dorris Carnes, MD   Subjective:    Cr 1.17 -> 1.24 -> 1.19 -> 1.08 -> 1.01. K 3.9. Tmax 99.1   Feeling better today. No further N/V. Continues to have scant hemoptysis. Remains on heparin. Coox 87.2%.   Coronary CT 12/23/18 with Calcium score of 0 and no significant coronary disease.   Objective:   Weight Range: 95.6 kg Body mass index is 36.75 kg/m.   Vital Signs:   Temp:  [98.2 F (36.8 C)-99.1 F (37.3 C)] 98.3 F (36.8 C) (03/13 0814) Pulse Rate:  [70-81] 77 (03/13 0814) Resp:  [11-37] 20 (03/13 0814) BP: (104-128)/(62-93) 128/89 (03/13 0814) SpO2:  [96 %-99 %] 99 % (03/13 0814) Last BM Date: 12/23/18  Weight change: Filed Weights   12/21/18 0800 12/22/18 0300 12/23/18 0600  Weight: 99.1 kg 95.3 kg 95.6 kg    Intake/Output:   Intake/Output Summary (Last 24 hours) at 12/24/2018 0828 Last data filed at 12/24/2018 0552 Gross per 24 hour  Intake 703.74 ml  Output 1520 ml  Net -816.26 ml    Physical Exam    CVP ~4 cm  General: Well appearing. No resp difficulty. HEENT: Normal Neck: Supple. JVP not elevated. Carotids 2+ bilat; no bruits. No thyromegaly or nodule noted. Cor: PMI nondisplaced. RRR, No M/G/R noted Lungs: CTAB, normal effort. Abdomen: Soft, non-tender, non-distended, no HSM. No bruits or masses. +BS  Extremities: No cyanosis, clubbing, or rash. R and LLE no edema.  Neuro: Alert & orientedx3, cranial nerves grossly intact. moves all 4 extremities w/o difficulty. Affect pleasant   Telemetry   NSR 70-80s, personally reviewed.   EKG    No new tracings.    Labs    CBC Recent Labs    12/23/18 0257 12/24/18 0258  WBC 10.0 8.4  HGB 9.9* 9.6*  HCT 32.9* 32.1*  MCV 74.8* 75.5*  PLT 296 557   Basic Metabolic Panel Recent Labs    12/23/18 0257 12/24/18 0258  NA 134* 136  K 4.0 3.9  CL 100 104  CO2 25 23  GLUCOSE 85 96  BUN 11 11  CREATININE 1.08* 1.01*  CALCIUM 8.1* 8.3*     Liver Function Tests No results for input(s): AST, ALT, ALKPHOS, BILITOT, PROT, ALBUMIN in the last 72 hours. No results for input(s): LIPASE, AMYLASE in the last 72 hours. Cardiac Enzymes No results for input(s): CKTOTAL, CKMB, CKMBINDEX, TROPONINI in the last 72 hours.  BNP: BNP (last 3 results) Recent Labs    12/17/18 2014 12/19/18 0943  BNP 836.8* 192.4*   ProBNP (last 3 results) Recent Labs    12/16/18 1150  PROBNP 3,613*   D-Dimer No results for input(s): DDIMER in the last 72 hours. Hemoglobin A1C No results for input(s): HGBA1C in the last 72 hours. Fasting Lipid Panel No results for input(s): CHOL, HDL, LDLCALC, TRIG, CHOLHDL, LDLDIRECT in the last 72 hours. Thyroid Function Tests No results for input(s): TSH, T4TOTAL, T3FREE, THYROIDAB in the last 72 hours.  Invalid input(s): FREET3  Other results:  Imaging   Ct Coronary Morph W/cta Cor W/score W/ca W/cm &/or Wo/cm  Addendum Date: 12/23/2018   ADDENDUM REPORT: 12/23/2018 22:57 CLINICAL DATA:  Chest pain EXAM: Cardiac CTA MEDICATIONS: Sub lingual nitro. 4mg  x 2 TECHNIQUE: The patient was scanned on a Siemens 322 slice scanner. Gantry rotation speed was 250 msecs. Collimation was 0.6 mm. A 100 kV prospective scan was triggered in the ascending thoracic aorta at 35-75% of  the R-R interval. Average HR during the scan was 70 bpm. The 3D data set was interpreted on a dedicated work station using MPR, MIP and VRT modes. A total of 80cc of contrast was used. FINDINGS: Non-cardiac: See separate report from [facility]. Pulmonary veins drain normally to the left atrium. Calcium Score: 0 Agatston units. Coronary Arteries: Right dominant with no anomalies LM: No plaque or stenosis. LAD system: Large D1. Misregistration artifact in the mid LAD. No plaque or stenosis. Circumflex system: Small AV LCx.  No plaque or stenosis. RCA system: No plaque or stenosis. IMPRESSION: 1. Coronary artery calcium score 0 Agatston units,  suggesting low risk for future cardiac events. 2.  No significant coronary disease was noted. Kitrina Maurin Electronically Signed   By: Loralie Champagne M.D.   On: 12/23/2018 22:57   Result Date: 12/23/2018 EXAM: OVER-READ INTERPRETATION  CT CHEST The following report is an over-read performed by radiologist Dr. Abigail Miyamoto of [facility], [place] on 12/23/2018. This over-read does not include interpretation of cardiac or coronary anatomy or pathology. The coronary CTA interpretation by the cardiologist is attached. COMPARISON:  Chest radiograph 12/19/2018. CTA of the chest of 12/17/2018. FINDINGS: Vascular: Normal aortic caliber. Redemonstration of segmental occlusive thrombus to the right lower lobe including on image 10/11. Mediastinum/Nodes: Node within the azygoesophageal recess measures 1.4 cm on image 3/11 and is similar. Incompletely imaged right hilar adenopathy at 1.6 cm on image 1/11. Lungs/Pleura: Small right pleural effusion is similar to on the prior CT. Slight increase in right lower lobe airspace disease with persistent more posterior ground-glass opacity. Right middle lobe opacity is somewhat more solid in appearance today versus ground-glass on the prior. Left lower lobe dependent atelectasis. Upper Abdomen: Normal imaged portions of the liver, spleen Musculoskeletal: No acute osseous abnormality. IMPRESSION: 1. Right-sided pulmonary embolism, as before. 2. Right-sided airspace disease is somewhat more confluent today, with more solid component and less ground-glass. Favor evolving pneumonia. 3. Mild thoracic adenopathy, nonspecific in the setting of congestive heart failure. Consider chest CT follow-up at 3-6 months. 4. Trace right pleural fluid, as before. Electronically Signed: By: Abigail Miyamoto M.D. On: 12/23/2018 14:44    Medications:    Scheduled Medications:  carvedilol  6.25 mg Oral BID WC   Chlorhexidine Gluconate Cloth  6 each Topical Daily   furosemide  40 mg Oral BID    sacubitril-valsartan  1 tablet Oral BID   sodium chloride flush  10-40 mL Intracatheter Q12H   sodium chloride flush  3 mL Intravenous Q12H   spironolactone  25 mg Oral QHS    Infusions:  sodium chloride Stopped (12/22/18 0920)   amiodarone 30 mg/hr (12/24/18 0121)   cefTRIAXone (ROCEPHIN)  IV Stopped (12/23/18 1025)   heparin 2,100 Units/hr (12/24/18 0120)    PRN Medications: sodium chloride, acetaminophen, albuterol, guaiFENesin-codeine, hydrALAZINE, morphine injection, ondansetron **OR** ondansetron (ZOFRAN) IV, oxyCODONE, sodium chloride flush, sodium chloride flush  Patient Profile   [name] [name] is a 33 y.o. female with h/o pregnancy induced HTN and suspected diastolic CHF. Admitted with worsening SOB and hemoptysis. Found to have acute PE +/- PNA with fever.   CHF team asked to see with marked volume overload and new systolic CHF.   Assessment/Plan   1. Acute systolic CHF - Suspect NICM in setting of HTN, though family history of earlier CAD.  - Echo 12/18/18 LVEF 20-25%, Severe LAE, Moderately reduced RV, Mod pericardial effusion (circumferential).  - Volume status improved. CVP ~4 - Decrease lasix to 40  mg daily. She was first started on po lasix 40 mg daily the DAY prior to admission.   - Start Bidil 0.5 tab TID.  - Continue Entresto 24/26 mg BID - Continue spiro 25 mg daily. - Continue Coreg 6.25 mg bid.  - Follow K .  - Coronary CT negative. No CAD.   2. Acute PE - Chest CTA demonstrated acute segmental right middle and right lower lobe pulmonary emboli without obvious right heart strain. - Stop heparin tomorrow with continue hemoptysis per IM. Then transition to Eliquis 10 mg BID x 7 days, then 5 mg BID for at least 6 months.   3. HTN - Meds as above.   4. PNA - By chest CT. - Decrease down to rocephin.  - Blood Cx NGTD.    5. Paroxysmal atrial fibrillation/SVT - Noted on tele.  - Transition to po amiodarone 400 mg BID x 1 week, then 200 mg  BID x 1 week, then 200 mg daily.  - This patients CHA2DS2-VASc Score is 3. May not need lifelong AC if does not recur outside of setting of acute PE.   6. Moderate pericardial effusion - Follow with diuresis. No tamponade physiology. No change.   7. Suspected OSA - Will need sleep study as outpatient. No change.   8. Morbid obesity - Body mass index is 36.75 kg/m.  - Will need encouraged weight loss as outpatient.   9. Hypokalemia - K 3.9. Follow.   She could potentially go home tomorrow from a HF perspective.   HF medications for home - Lasix 40 mg daily - Entresto 24/26 mg BID - Bidil 0.5 tab TID - Spironolactone 25 mg daily - Coreg 6.25 mg BID - Eliquis 10 mg BID x 7 days, then 5 mg BID - Amiodarone 400 mg BID x 7 days, then 200 mg BID x 7 days, then 200 mg daily.   Will place follow up in AVS.   Medication concerns reviewed with patient and pharmacy team. Barriers identified: Questionable compliance.   Length of Stay: [place], [place]  12/24/2018, 8:28 AM  Advanced Heart Failure Team Pager [phone] (M-F; 7a - 4p)  Please contact [facility] for night-coverage after hours (4p -7a ) and weekends on [email or web address]  Patient seen with PA, agree with the above note.   CVP 4 this morning with co-ox 87%. Creatinine stable. Feeling better.   Coronary CT with no CAD.   On exam,no JVD. Decreased BS on right. Regular S1S2. Noedema.   1. Acute on chronic systolic CHF: She has a nonischemic cardiomyopathy, coronary CTA showed no CAD. EF was reportedly low on echo at hospital in [facility] in 12/19. This admission, EF 20-25% with moderate RV systolic dysfunction. As above, no recent pregnancy so doubt peri-partum CMP. Cannot rule out viral myocarditis as cause or long-standing uncontrolled HTN.She has diuresed well, now CVP 4with co-ox 87%. - Cardiac MRI to look for evidence of infiltrative disease or myocarditis.  - Can continue Lasix 40 mg  daily.  - Start Bidil 1/2 tab tid.  -Continue spironolactone25 mg daily.  -Continue Coreg 6.25 mg bid. -ContinueEntresto 24/26 bid 2. Pulmonary embolus: On right. She says that she has not been moving much at all for weeks due to exertional dyspnea. She has no family history of VTE.  - Transition to Eliquis off heparin per [facility].   3. Atrial fibrillation: Paroxysmal, currently in NSR. No prior history. Likely triggered by CHF and PE.  - Transition to  Eliquis off heparin per [facility].   - Now on po amiodarone.  Atrial fibrillation likely triggered by PE/active CHF/PNA.  Can stop amiodarone after 1 month and watch for recurrence.  4. PNA: Chest CT suggestive of PNA, not pulmonary infarction.Now afebrile.  - Ceftriaxone per primary service.  5. Moderate pericardial effusion: No tamponade.  Loralie Champagne 12/24/2018 12:58 PM

## 2018-12-24 NOTE — TOC Benefit Eligibility Note (Signed)
Transition of Care [facility]) Benefit Eligibility Note    Patient Details  Name: [name] [name] MRN: [id number] Date of Birth: [birth year]   Medication/Dose: Bidil 20- 37.5 mg 1/2 tcb 3 x a day.  Covered?: Yes  Tier: Other  Prescription Coverage Preferred Pharmacy: [facility] with Person/Company/Phone Number:: Charisse March.  [place]  Co-Pay: $483.50  for 30 dau supply  Prior Approval: No  Deductible: Unmet       Shelda Altes Phone Number: 12/24/2018, 12:25 PM

## 2018-12-24 NOTE — Progress Notes (Signed)
CARDIAC REHAB PHASE I   PRE:  Rate/Rhythm: 56 SR    BP: sitting 113/75    SaO2: 100 RA  MODE:  Ambulation: stood and cleaned herself, sat, walked around bed   POST:  Rate/Rhythm: 3 SR with activity  Came to ambulate pt however she had had some incontinence. Assisted pt while she stood and cleaned herself. Pt fatigued with activity and sat for rest. Then transport came to take pt to MRI so no ambulation except around the foot of bed with RW. She is generally weak and fatigued but improving.   Pt was reading HF booklet on my arrival. Encouraged her to continue and I also left low sodium diet sheets and CRPII brochure. She is interested in CRPII and I will send referral to [place]. CR team will f/u tomorrow to continue ed and ambulation. [place], ACSM 12/24/2018 1:49 PM

## 2018-12-24 NOTE — Progress Notes (Signed)
[place] TEAM 1 - Stepdown/ICU TEAM  [name] [name]  TMH:[id number] DOB: 07-07-1986 DOA: 12/17/2018 PCP: Fanny Bien, MD    Brief Narrative:  89JJ admitted on 3/6 with fever and dyspnea. Found to have a PE and new systolic heart failure.  Significant Events: 3/6 Admitted 3/7 TTE - EF 20-25% - reduced RV systolic fxn 3/8 SVT requiring ICU transfer for potential cardioversion 3/8 venous duplex - no LE DVT  3/12 Cardiac CT - calcium score 0  Subjective: The patient is sitting up on the side of the bed.  She denies chest pain or significant shortness of breath.  She reports ongoing but scant hemoptysis.  There is been no fevers or chills.  She is alert and oriented.  Assessment & Plan:  Acute systolic heart failure Care as per CHF Team - suspect NICM due to HTN  CAP complete 7 days of antibiotic therapy -clinically much improved  Pulmonary embolism birth control and obesity related - heparin drip per pharmacy - needs 6 months anticoag per PCCM - begin DOAC when hemoptysis resolved   HTN BP is well controlled   PAF / SVT Amiodarone as per Cardiology - will be on anticoag for now - decision on long term anticoag to be made by Cards in f/u   Suspected OSA Will need outpt sleep study   Morbid obesity - Body mass index is 36.75 kg/m.   DVT prophylaxis: IV heparin  Code Status: FULL CODE Family Communication: no family present at time of exam  Disposition Plan:   Consultants:  PCCM CHF Team   Antimicrobials:  Azithro 3/6 > 3/12 Maxipime 3/7 > 3/10 Vanc 3/7 > 3/8 Ceftriaxone 3/11 >   Objective: Blood pressure 128/89, pulse 77, temperature 98.3 F (36.8 C), temperature source Oral, resp. rate 20, height 5' 3.5" (1.613 m), weight 95.6 kg, SpO2 99 %.  Intake/Output Summary (Last 24 hours) at 12/24/2018 0904 Last data filed at 12/24/2018 0552 Gross per 24 hour  Intake 666.11 ml  Output 1520 ml  Net -853.89 ml   Filed Weights   12/21/18 0800 12/22/18 0300  12/23/18 0600  Weight: 99.1 kg 95.3 kg 95.6 kg    Examination: General: No acute respiratory distress Lungs: Clear to auscultation bilaterally without wheezes or crackles Cardiovascular: Regular rate and rhythm without murmur gallop or rub normal S1 and S2 Abdomen: Nontender, nondistended, soft, bowel sounds positive, no rebound, no ascites, no appreciable mass Extremities: Trace bilateral lower extremity edema  CBC: Recent Labs  Lab 12/22/18 0312 12/23/18 0257 12/24/18 0258  WBC 11.2* 10.0 8.4  HGB 10.2* 9.9* 9.6*  HCT 33.8* 32.9* 32.1*  MCV 74.6* 74.8* 75.5*  PLT 332 296 941   Basic Metabolic Panel: Recent Labs  Lab 12/18/18 0858  12/19/18 0943  12/22/18 0312 12/23/18 0257 12/24/18 0258  NA  --    < > 135   < > 132* 134* 136  K  --    < > 3.7   < > 3.4* 4.0 3.9  CL  --    < > 104   < > 90* 100 104  CO2  --    < > 24   < > 30 25 23   GLUCOSE  --    < > 89   < > 270* 85 96  BUN  --    < > 10   < > 11 11 11   CREATININE  --    < > 1.00   < > 1.19* 1.08* 1.01*  CALCIUM  --    < > 8.1*   < > 8.3* 8.1* 8.3*  MG 1.5*  --  2.0  --   --   --   --   PHOS 3.5  --   --   --   --   --   --    < > = values in this interval not displayed.   GFR: Estimated Creatinine Clearance: 89.7 mL/min (A) (by C-G formula based on SCr of 1.01 mg/dL (H)).  Liver Function Tests: Recent Labs  Lab 12/18/18 1431 12/19/18 0943 12/20/18 0411  AST 25 20 23   ALT 58* 46* 39  ALKPHOS 36* 36* 36*  BILITOT 3.7* 3.4* 1.8*  PROT 5.2* 5.4* 5.5*  ALBUMIN 2.3* 2.1* 2.1*    Cardiac Enzymes: Recent Labs  Lab 12/17/18 2215 12/18/18 0858 12/18/18 1431 12/18/18 2034  TROPONINI 0.14* 0.21* 0.21* 0.16*    Recent Results (from the past 240 hour(s))  Culture, blood (routine x 2) Call MD if unable to obtain prior to antibiotics being given     Status: None   Collection Time: 12/18/18  9:00 AM  Result Value Ref Range Status   Specimen Description BLOOD LEFT HAND  Final   Special Requests   Final     BOTTLES DRAWN AEROBIC AND ANAEROBIC Blood Culture adequate volume   Culture   Final    NO GROWTH 5 DAYS Performed at [facility], [place] [address], [place], [place] [address]    Report Status 12/23/2018 FINAL  Final  Culture, blood (routine x 2) Call MD if unable to obtain prior to antibiotics being given     Status: None   Collection Time: 12/18/18  9:50 AM  Result Value Ref Range Status   Specimen Description BLOOD RIGHT HAND  Final   Special Requests   Final    BOTTLES DRAWN AEROBIC ONLY Blood Culture results may not be optimal due to an inadequate volume of blood received in culture bottles   Culture   Final    NO GROWTH 5 DAYS Performed at [facility], [place] [address], [place], [place] [address]    Report Status 12/23/2018 FINAL  Final  Respiratory Panel by PCR     Status: None   Collection Time: 12/19/18  4:22 PM  Result Value Ref Range Status   Adenovirus NOT DETECTED NOT DETECTED Final   Coronavirus 229E NOT DETECTED NOT DETECTED Final    Comment: (NOTE) The Coronavirus on the Respiratory Panel, DOES NOT test for the novel  Coronavirus (2019 nCoV)    Coronavirus HKU1 NOT DETECTED NOT DETECTED Final   Coronavirus NL63 NOT DETECTED NOT DETECTED Final   Coronavirus OC43 NOT DETECTED NOT DETECTED Final   Metapneumovirus NOT DETECTED NOT DETECTED Final   Rhinovirus / Enterovirus NOT DETECTED NOT DETECTED Final   Influenza A NOT DETECTED NOT DETECTED Final   Influenza B NOT DETECTED NOT DETECTED Final   Parainfluenza Virus 1 NOT DETECTED NOT DETECTED Final   Parainfluenza Virus 2 NOT DETECTED NOT DETECTED Final   Parainfluenza Virus 3 NOT DETECTED NOT DETECTED Final   Parainfluenza Virus 4 NOT DETECTED NOT DETECTED Final   Respiratory Syncytial Virus NOT DETECTED NOT DETECTED Final   Bordetella pertussis NOT DETECTED NOT DETECTED Final   Chlamydophila pneumoniae NOT DETECTED NOT DETECTED Final   Mycoplasma pneumoniae NOT DETECTED NOT DETECTED Final    Comment:  Performed at [facility], [address], [place], [place] [address]  MRSA PCR Screening     Status: None   Collection Time: 12/19/18  5:53 PM  Result Value Ref Range Status   MRSA by PCR NEGATIVE NEGATIVE Final    Comment:        The GeneXpert MRSA Assay (FDA approved for NASAL specimens only), is one component of a comprehensive MRSA colonization surveillance program. It is not intended to diagnose MRSA infection nor to guide or monitor treatment for MRSA infections. Performed at [facility], [place] [address], [place], [place] [address]   Culture, blood (routine x 2)     Status: None (Preliminary result)   Collection Time: 12/19/18  6:54 PM  Result Value Ref Range Status   Specimen Description BLOOD RIGHT HAND  Final   Special Requests   Final    BOTTLES DRAWN AEROBIC AND ANAEROBIC Blood Culture adequate volume   Culture   Final    NO GROWTH 4 DAYS Performed at [facility], [place] [address], [place], [place] [address]    Report Status PENDING  Incomplete  Culture, blood (routine x 2)     Status: None (Preliminary result)   Collection Time: 12/19/18  6:54 PM  Result Value Ref Range Status   Specimen Description BLOOD LEFT HAND  Final   Special Requests   Final    BOTTLES DRAWN AEROBIC AND ANAEROBIC Blood Culture adequate volume   Culture   Final    NO GROWTH 4 DAYS Performed at [facility], [place] [address], [place], [place] [address]    Report Status PENDING  Incomplete  Culture, sputum-assessment     Status: None   Collection Time: 12/20/18 11:13 AM  Result Value Ref Range Status   Specimen Description SPUTUM  Final   Special Requests Immunocompromised  Final   Sputum evaluation   Final    THIS SPECIMEN IS ACCEPTABLE FOR SPUTUM CULTURE Performed at [facility], [place] [address], [place], [place] [address]    Report Status 12/20/2018 FINAL  Final  Culture, respiratory     Status: None   Collection Time: 12/20/18 11:13 AM  Result Value  Ref Range Status   Specimen Description SPUTUM  Final   Special Requests Immunocompromised Reflexed from [id number]  Final   Gram Stain   Final    MODERATE WBC PRESENT, PREDOMINANTLY MONONUCLEAR FEW SQUAMOUS EPITHELIAL CELLS PRESENT FEW GRAM POSITIVE RODS RARE GRAM POSITIVE COCCI RARE GRAM NEGATIVE RODS    Culture   Final    FEW Consistent with normal respiratory flora. Performed at [facility], [place] [address], [place],  [address]    Report Status 12/22/2018 FINAL  Final     Scheduled Meds: . amiodarone  400 mg Oral BID  . apixaban  10 mg Oral BID   Followed by  . [START ON 12/31/2018] apixaban  5 mg Oral BID  . carvedilol  6.25 mg Oral BID WC  . Chlorhexidine Gluconate Cloth  6 each Topical Daily  . [START ON 12/25/2018] furosemide  40 mg Oral Daily  . isosorbide-hydrALAZINE  0.5 tablet Oral TID  . sacubitril-valsartan  1 tablet Oral BID  . sodium chloride flush  10-40 mL Intracatheter Q12H  . sodium chloride flush  3 mL Intravenous Q12H  . spironolactone  25 mg Oral QHS     LOS: 7 days   Cherene Altes, MD [facility] Office  [phone] Pager - Text Page per Amion  If 7PM-7AM, please contact night-coverage per [facility]  12/24/2018, 9:04 AM

## 2018-12-24 NOTE — Progress Notes (Addendum)
Occupational Therapy Treatment Patient Details Name: [name] [name] MRN: [id number] DOB: 1986/09/12 Today's Date: 12/24/2018    History of present illness Pt is a 33 y.o. female admitted with worsening SOB and hemoptysis. Chest CTA with R lobe pulmonary emboli +/- PNA. Developed SVT/afib with RVR requiring transfer to ICU. Also with acute systolic CHF; suspect NICM in setting of HTN. PMH includes pregnancy-induced HTN.   OT comments  Pt progressing toward established goals. Pt currently requires minguard-minA for ADL and functional mobility at RW level. Pt reports she feels "stronger and better" this date, but still feels weak in her legs. No reports of dizziness or blurry vision this session. Nsg administered BP medication upon start of session, BP 171/128 sitting EOB (taken on lower leg), BP 198/180 standing 2 min (taken on lower leg), nsg aware of BP, approved mobility this session. Pt will continue to benefit from skilled OT services to maximize safety and independence with ADL and functional mobility. Will continue to follow acutely.    Follow Up Recommendations  Supervision/Assistance - 24 hour;CIR    Equipment Recommendations  3 in 1 bedside commode    Recommendations for Other Services      Precautions / Restrictions Precautions Precautions: Fall Precaution Comments: Watch BP Restrictions Weight Bearing Restrictions: No       Mobility Bed Mobility Overal bed mobility: Modified Independent             General bed mobility comments: pt able to progress to upright position EOB with increased time and effort  Transfers Overall transfer level: Needs assistance Equipment used: Rolling walker (2 wheeled) Transfers: Sit to/from Stand Sit to Stand: Min assist         General transfer comment: mina for balance initially    Balance   Sitting-balance support: No upper extremity supported;Feet supported Sitting balance-Leahy Scale: Fair Sitting balance - Comments:  EOB   Standing balance support: Single extremity supported;During functional activity Standing balance-Leahy Scale: Poor Standing balance comment: Reliant on UE support                           ADL either performed or assessed with clinical judgement   ADL Overall ADL's : Needs assistance/impaired     Grooming: Wash/dry hands;Min guard;Standing Grooming Details (indicate cue type and reason): completed hand washing standing at sink level                  Toilet Transfer: Minimal assistance;RW;Ambulation Armed forces technical officer Details (indicate cue type and reason): simulated sit<>stand from EOB with ambulation and return to recliner;minA          Functional mobility during ADLs: Min guard;Minimal assistance;Rolling walker General ADL Comments: decreased balance and activity tolerance;reliant on UE support     Vision       Perception     Praxis      Cognition Arousal/Alertness: Awake/alert Behavior During Therapy: WFL for tasks assessed/performed Overall Cognitive Status: Within Functional Limits for tasks assessed                                 General Comments: pt reports she feels "stronger and better" this date.         Exercises     Shoulder Instructions       General Comments 171/128 sitting EOB taken on lower leg, 198/180 standing after 60min taken on lower leg;nsg administered BP medication prior  to session, nsg approved mobility      Pertinent Vitals/ Pain       Pain Assessment: No/denies pain Pain Intervention(s): Monitored during session  Home Living                                          Prior Functioning/Environment              Frequency  Min 3X/week        Progress Toward Goals  OT Goals(current goals can now be found in the care plan section)  Progress towards OT goals: Progressing toward goals  Acute Rehab OT Goals Patient Stated Goal: to get home to her son OT Goal Formulation:  With patient Time For Goal Achievement: 01/03/19 Potential to Achieve Goals: Good ADL Goals Pt Will Perform Grooming: with modified independence;standing Pt Will Perform Upper Body Dressing: with modified independence;sitting Pt Will Perform Lower Body Dressing: with modified independence;sit to/from stand Pt Will Transfer to Toilet: with modified independence;ambulating Pt Will Perform Toileting - Clothing Manipulation and hygiene: with modified independence;sit to/from stand  Plan Discharge plan remains appropriate;Frequency remains appropriate    Co-evaluation                 AM-PAC OT "6 Clicks" Daily Activity     Outcome Measure   Help from another person eating meals?: None Help from another person taking care of personal grooming?: A Little Help from another person toileting, which includes using toliet, bedpan, or urinal?: A Little Help from another person bathing (including washing, rinsing, drying)?: A Little Help from another person to put on and taking off regular upper body clothing?: A Little Help from another person to put on and taking off regular lower body clothing?: A Little 6 Click Score: 19    End of Session Equipment Utilized During Treatment: Gait belt;Rolling walker  OT Visit Diagnosis: Unsteadiness on feet (R26.81);Other abnormalities of gait and mobility (R26.89);Muscle weakness (generalized) (M62.81);Pain   Activity Tolerance Patient limited by fatigue   Patient Left in chair;with call bell/phone within reach   Nurse Communication Mobility status;Other (comment)        Time: 4599-7741 OT Time Calculation (min): 49 min  Charges: OT General Charges $OT Visit: 1 Visit OT Treatments $Self Care/Home Management : 23-37 mins $Therapeutic Activity: 8-22 mins  Dorinda Hill OTR/L Acute Rehabilitation Services Office: [place] 12/24/2018, 10:57 AM

## 2018-12-25 LAB — COMPREHENSIVE METABOLIC PANEL
ALK PHOS: 40 U/L (ref 38–126)
ALT: 22 U/L (ref 0–44)
AST: 24 U/L (ref 15–41)
Albumin: 1.9 g/dL — ABNORMAL LOW (ref 3.5–5.0)
Anion gap: 9 (ref 5–15)
BUN: 9 mg/dL (ref 6–20)
CALCIUM: 8.7 mg/dL — AB (ref 8.9–10.3)
CO2: 25 mmol/L (ref 22–32)
Chloride: 103 mmol/L (ref 98–111)
Creatinine, Ser: 0.99 mg/dL (ref 0.44–1.00)
GFR calc non Af Amer: >60 mL/min (ref >60)
Glucose, Bld: 100 mg/dL — ABNORMAL HIGH (ref 70–99)
Potassium: 3.7 mmol/L (ref 3.5–5.1)
Sodium: 137 mmol/L (ref 135–145)
TOTAL PROTEIN: 5.5 g/dL — AB (ref 6.5–8.1)
Total Bilirubin: 0.7 mg/dL (ref 0.3–1.2)

## 2018-12-25 LAB — COOXEMETRY PANEL
Carboxyhemoglobin: 1.3 % (ref 0.5–1.5)
Methemoglobin: 1.3 % (ref 0.0–1.5)
O2 Saturation: 60.4 %
Total hemoglobin: 10 g/dL — ABNORMAL LOW (ref 12.0–16.0)

## 2018-12-25 LAB — CBC
HCT: 32.1 % — ABNORMAL LOW (ref 36.0–46.0)
Hemoglobin: 9.1 g/dL — ABNORMAL LOW (ref 12.0–15.0)
MCH: 22.1 pg — ABNORMAL LOW (ref 26.0–34.0)
MCHC: 28.3 g/dL — ABNORMAL LOW (ref 30.0–36.0)
MCV: 77.9 fL — ABNORMAL LOW (ref 80.0–100.0)
Platelets: 363 10*3/uL (ref 150–400)
RBC: 4.12 MIL/uL (ref 3.87–5.11)
RDW: 16.6 % — ABNORMAL HIGH (ref 11.5–15.5)
WBC: 7.5 10*3/uL (ref 4.0–10.5)
nRBC: 0 % (ref 0.0–0.2)

## 2018-12-25 MED ORDER — ISOSORB DINITRATE-HYDRALAZINE 20-37.5 MG PO TABS
1.0000 | ORAL_TABLET | Freq: Three times a day (TID) | ORAL | Status: DC
  Administered 2018-12-25 – 2018-12-27 (×6): 1 via ORAL
  Filled 2018-12-25 (×6): qty 1

## 2018-12-25 NOTE — Progress Notes (Signed)
Patient ID: [name] [name], female   DOB: 1986-03-05, 33 y.o.   MRN: [id number]     Advanced Heart Failure Rounding Note  PCP-Cardiologist: Dorris Carnes, MD   Subjective:    No complaints this morning, breathing much better.  Scan hemoptysis now.   Coronary CT 12/23/18 with Calcium score of 0 and no significant coronary disease.   Cardiac MRI:  1.  Moderate pericardial effusion without tamponade. 2. Mild-moderately dilated LV with mild LV hypertrophy. EF 16%, diffuse hypokinesis. Possible small LV thrombus. 3.  Mild RV dilation with EF 21%. 4.  Areas of non-coronary pattern LGE noted, ?myocarditis.  Objective:   Weight Range: 95.6 kg Body mass index is 36.75 kg/m.   Vital Signs:   Temp:  [97.6 F (36.4 C)-98.7 F (37.1 C)] 98 F (36.7 C) (03/14 0732) Pulse Rate:  [67-78] 75 (03/14 0732) Resp:  [17-21] 17 (03/14 0732) BP: (102-129)/(60-90) 129/90 (03/14 0732) SpO2:  [99 %-100 %] 100 % (03/14 0732) FiO2 (%):  [16 %] 16 % (03/13 1955) Last BM Date: 12/23/18  Weight change: Filed Weights   12/21/18 0800 12/22/18 0300 12/23/18 0600  Weight: 99.1 kg 95.3 kg 95.6 kg    Intake/Output:   Intake/Output Summary (Last 24 hours) at 12/25/2018 0938 Last data filed at 12/25/2018 0400 Gross per 24 hour  Intake 549.83 ml  Output 650 ml  Net -100.17 ml    Physical Exam    General: NAD Neck: No JVD, no thyromegaly or thyroid nodule.  Lungs: Clear to auscultation bilaterally with normal respiratory effort. CV: Nondisplaced PMI.  Heart regular S1/S2, no S3/S4, no murmur.  No peripheral edema.  Abdomen: Soft, nontender, no hepatosplenomegaly, no distention.  Skin: Intact without lesions or rashes.  Neurologic: Alert and oriented x 3.  Psych: Normal affect. Extremities: No clubbing or cyanosis.  HEENT: Normal.     Telemetry   NSR 80s, personally reviewed.   EKG    No new tracings.    Labs    CBC Recent Labs    12/24/18 0258 12/25/18 0611  WBC 8.4 7.5  HGB 9.6* 9.1*   HCT 32.1* 32.1*  MCV 75.5* 77.9*  PLT 303 518   Basic Metabolic Panel Recent Labs    12/24/18 0258 12/25/18 0541  NA 136 137  K 3.9 3.7  CL 104 103  CO2 23 25  GLUCOSE 96 100*  BUN 11 9  CREATININE 1.01* 0.99  CALCIUM 8.3* 8.7*   Liver Function Tests Recent Labs    12/25/18 0541  AST 24  ALT 22  ALKPHOS 40  BILITOT 0.7  PROT 5.5*  ALBUMIN 1.9*   No results for input(s): LIPASE, AMYLASE in the last 72 hours. Cardiac Enzymes No results for input(s): CKTOTAL, CKMB, CKMBINDEX, TROPONINI in the last 72 hours.  BNP: BNP (last 3 results) Recent Labs    12/17/18 2014 12/19/18 0943  BNP 836.8* 192.4*   ProBNP (last 3 results) Recent Labs    12/16/18 1150  PROBNP 3,613*   D-Dimer No results for input(s): DDIMER in the last 72 hours. Hemoglobin A1C No results for input(s): HGBA1C in the last 72 hours. Fasting Lipid Panel No results for input(s): CHOL, HDL, LDLCALC, TRIG, CHOLHDL, LDLDIRECT in the last 72 hours. Thyroid Function Tests No results for input(s): TSH, T4TOTAL, T3FREE, THYROIDAB in the last 72 hours.  Invalid input(s): FREET3  Other results:  Imaging   Mr Cardiac Morphology W Wo Contrast  Result Date: 12/25/2018 CLINICAL DATA:  Cardiomyopathy of uncertain etiology EXAM:  CARDIAC MRI TECHNIQUE: The patient was scanned on a 1.5 Tesla GE magnet. A dedicated cardiac coil was used. Functional imaging was done using Fiesta sequences. 2,3, and 4 chamber views were done to assess for RWMA's. Modified Simpson's rule using a short axis stack was used to calculate an ejection fraction on a dedicated work Conservation officer, nature. The patient received 10 cc of Gadavist. After 10 minutes inversion recovery sequences were used to assess for infiltration and scar tissue. CONTRAST:  10 cc Gadavist FINDINGS: Small right pleural effusion. Moderate pericardial effusion without tamponade. Mildly dilated left ventricle with mild LV hypertrophy. Findings not consistent  with noncompaction. Possible very small LV thrombus seen best on delayed enhancement images. T2 images were difficult. Diffuse hypokinesis, EF 16%. Mild right ventricular enlargement with EF 21%. Moderate left atrial enlargement. Mild right atrial enlargement. Trileaflet aortic valve without significant regurgitation or stenosis. Mild mitral regurgitation. On delayed enhancement imaging, there was a small area of mid-wall LGE in the mid inferolateral wall. There was a small area of subepicardial LGE in the mid anterolateral wall. Small area of subendocardial LGE in the mid anterior wall. Measurements: LVEDV 266 mL LVSV 43 mL LVEF 16% RVEDV 176 mL RVSV 41mL RVEF 21% IMPRESSION: 1.  Moderate pericardial effusion without tamponade. 2. Mild-moderately dilated LV with mild LV hypertrophy. EF 16%, diffuse hypokinesis. Possible small LV thrombus. 3.  Mild RV dilation with EF 21%. 4.  Areas of non-coronary pattern LGE noted, ?myocarditis. Marlow Berenguer Electronically Signed   By: Loralie Champagne M.D.   On: 12/25/2018 09:32    Medications:    Scheduled Medications: . amiodarone  400 mg Oral BID   Followed by  . [START ON 12/30/2018] amiodarone  200 mg Oral BID   Followed by  . [START ON 01/07/2019] amiodarone  200 mg Oral Daily  . apixaban  10 mg Oral BID   Followed by  . [START ON 01/01/2019] apixaban  5 mg Oral BID  . carvedilol  6.25 mg Oral BID WC  . Chlorhexidine Gluconate Cloth  6 each Topical Daily  . furosemide  40 mg Oral Daily  . isosorbide-hydrALAZINE  1 tablet Oral TID  . sacubitril-valsartan  1 tablet Oral BID  . sodium chloride flush  10-40 mL Intracatheter Q12H  . sodium chloride flush  3 mL Intravenous Q12H  . spironolactone  25 mg Oral QHS    Infusions: . sodium chloride Stopped (12/22/18 0920)  . heparin Stopped (12/25/18 0915)    PRN Medications: sodium chloride, acetaminophen, albuterol, guaiFENesin-codeine, hydrALAZINE, morphine injection, ondansetron **OR** ondansetron (ZOFRAN)  IV, oxyCODONE, sodium chloride flush, sodium chloride flush, traMADol  Patient Profile   [name] [name] is a 33 y.o. female with h/o pregnancy induced HTN and suspected diastolic CHF. Admitted with worsening SOB and hemoptysis. Found to have acute PE +/- PNA with fever.   CHF team asked to see with marked volume overload and new systolic CHF.   Assessment/Plan   1. Acute on chronic systolic CHF: She has a nonischemic cardiomyopathy, coronary CTA showed no CAD. EF was reportedly low on echo at hospital in [facility] in 12/19. This admission, echo with EF 20-25% with moderate RV systolic dysfunction. As above, no recent pregnancy so doubt peri-partum CMP. No family history of CHF and coronary CTA showed no CAD.  Cardiac MRI showed LV EF 16% with possible small LV thrombus, RV EF 21%.  Delayed enhancement imaging showed non-coronary pattern LGE that could be consistent with myocarditis. CVP 6-7 now  with co-ox 60% and stable BP.  - Can continue Lasix 40 mg daily.  - Increase Bidil to 1 tab tid.   -Continue spironolactone25 mg daily.  -Continue Coreg 6.25 mg bid. -ContinueEntresto 24/26 bid - She will need close followup to assess her trajectory.  It is possible she could need advanced therapies down the road.  2. Pulmonary embolus: On right. She says that she has not been moving much at all for weeks due to exertional dyspnea. She has no family history of VTE.  - Transitioned to Eliquis.   3. Atrial fibrillation: Paroxysmal, currently in NSR. No prior history. Likely triggered by CHF and PE.  - Transitioned to Eliquis off heparin. - Now on po amiodarone.  Atrial fibrillation likely triggered by PE/active CHF/PNA.  Can stop amiodarone after 1 month and watch for recurrence.  4. PNA: Chest CT suggestive of PNA, not pulmonary infarction.Now afebrile.  - Abx course completed.   5. Moderate pericardial effusion: No tamponade. 6. Possible small LV thrombus: Will be  anticoagulated.   She will need very close followup in CHF clinic.   Loralie Champagne 12/25/2018 9:38 AM

## 2018-12-25 NOTE — Progress Notes (Signed)
[name] [name] TEAM 1 - Stepdown/ICU TEAM  [name] [name]  LKG:[id number] DOB: [birth year] DOA: 12/17/2018 PCP: Fanny Bien, MD    Brief Narrative:  67YQ admitted on 3/6 with fever and dyspnea. Found to have a PE and new systolic heart failure.  Significant Events: 3/6 Admitted 3/7 TTE - EF 20-25% - reduced RV systolic fxn 3/8 SVT requiring ICU transfer for potential cardioversion 3/8 venous duplex - no LE DVT  3/12 Cardiac CT - calcium score 0  Subjective: The patient is feeling much better today.  She is sitting up at bedside.  She reports no chest pain shortness of breath nausea vomiting or abdominal pain.  Intermittently she will cough up pink-tinged sputum but otherwise reports no large-scale hemoptysis.  Assessment & Plan:  Acute systolic heart failure Care as per CHF Team - suspect NICM due to HTN -no evidence of CAD on coronary CT -continue Lasix and Aldactone -will require close follow-up in CHF clinic  CAP complete 7 days of antibiotic therapy -clinically much improved  Pulmonary embolism birth control and obesity related - heparin drip now transition to Eliquis- needs 6 months anticoag per PCCM   HTN BP is well controlled   PAF / SVT Amiodarone as per Cardiology - will be on anticoag for now - decision on long term anticoag to be made by Cards in f/u -plan to stop amiodarone after 30 days of treatment  Suspected OSA Will need outpt sleep study   Morbid obesity - Body mass index is 36.75 kg/m.   DVT prophylaxis: IV heparin  Code Status: FULL CODE Family Communication: no family present at time of exam  Disposition Plan: ambulate - possible d/c home 12/26/18  Consultants:  PCCM CHF Team   Antimicrobials:  Azithro 3/6 > 3/12 Maxipime 3/7 > 3/10 Vanc 3/7 > 3/8 Ceftriaxone 3/11 >   Objective: Blood pressure 112/81, pulse 67, temperature 98 F (36.7 C), temperature source Oral, resp. rate (!) 21, height 5' 3.5" (1.613 m), weight 95.6 kg, SpO2 100 %.   Intake/Output Summary (Last 24 hours) at 12/25/2018 1419 Last data filed at 12/25/2018 0915 Gross per 24 hour  Intake 664.86 ml  Output 650 ml  Net 14.86 ml   Filed Weights   12/21/18 0800 12/22/18 0300 12/23/18 0600  Weight: 99.1 kg 95.3 kg 95.6 kg    Examination: General: No acute respiratory distress Lungs: Clear to auscultation bilaterally - no wheezing  Cardiovascular: RRR - no M or rub  Abdomen: Nontender, overweight, soft, bowel sounds positive, no rebound, no ascites, no appreciable mass Extremities: Trace bilateral lower extremity edema w/o change   CBC: Recent Labs  Lab 12/23/18 0257 12/24/18 0258 12/25/18 0611  WBC 10.0 8.4 7.5  HGB 9.9* 9.6* 9.1*  HCT 32.9* 32.1* 32.1*  MCV 74.8* 75.5* 77.9*  PLT 296 303 034   Basic Metabolic Panel: Recent Labs  Lab 12/19/18 0943  12/23/18 0257 12/24/18 0258 12/25/18 0541  NA 135   < > 134* 136 137  K 3.7   < > 4.0 3.9 3.7  CL 104   < > 100 104 103  CO2 24   < > 25 23 25   GLUCOSE 89   < > 85 96 100*  BUN 10   < > 11 11 9   CREATININE 1.00   < > 1.08* 1.01* 0.99  CALCIUM 8.1*   < > 8.1* 8.3* 8.7*  MG 2.0  --   --   --   --    < > =  values in this interval not displayed.   GFR: Estimated Creatinine Clearance: 91.5 mL/min (by C-G formula based on SCr of 0.99 mg/dL).  Liver Function Tests: Recent Labs  Lab 12/18/18 1431 12/19/18 0943 12/20/18 0411 12/25/18 0541  AST 25 20 23 24   ALT 58* 46* 39 22  ALKPHOS 36* 36* 36* 40  BILITOT 3.7* 3.4* 1.8* 0.7  PROT 5.2* 5.4* 5.5* 5.5*  ALBUMIN 2.3* 2.1* 2.1* 1.9*    Cardiac Enzymes: Recent Labs  Lab 12/18/18 1431 12/18/18 2034  TROPONINI 0.21* 0.16*    Recent Results (from the past 240 hour(s))  Culture, blood (routine x 2) Call MD if unable to obtain prior to antibiotics being given     Status: None   Collection Time: 12/18/18  9:00 AM  Result Value Ref Range Status   Specimen Description BLOOD LEFT HAND  Final   Special Requests   Final    BOTTLES DRAWN  AEROBIC AND ANAEROBIC Blood Culture adequate volume   Culture   Final    NO GROWTH 5 DAYS Performed at [facility], [place] [address] [place], [place] [address]    Report Status 12/23/2018 FINAL  Final  Culture, blood (routine x 2) Call MD if unable to obtain prior to antibiotics being given     Status: None   Collection Time: 12/18/18  9:50 AM  Result Value Ref Range Status   Specimen Description BLOOD RIGHT HAND  Final   Special Requests   Final    BOTTLES DRAWN AEROBIC ONLY Blood Culture results may not be optimal due to an inadequate volume of blood received in culture bottles   Culture   Final    NO GROWTH 5 DAYS Performed at [facility], [place] [address] [place], [place] [address]    Report Status 12/23/2018 FINAL  Final  Respiratory Panel by PCR     Status: None   Collection Time: 12/19/18  4:22 PM  Result Value Ref Range Status   Adenovirus NOT DETECTED NOT DETECTED Final   Coronavirus 229E NOT DETECTED NOT DETECTED Final    Comment: (NOTE) The Coronavirus on the Respiratory Panel, DOES NOT test for the novel  Coronavirus (2019 nCoV)    Coronavirus HKU1 NOT DETECTED NOT DETECTED Final   Coronavirus NL63 NOT DETECTED NOT DETECTED Final   Coronavirus OC43 NOT DETECTED NOT DETECTED Final   Metapneumovirus NOT DETECTED NOT DETECTED Final   Rhinovirus / Enterovirus NOT DETECTED NOT DETECTED Final   Influenza A NOT DETECTED NOT DETECTED Final   Influenza B NOT DETECTED NOT DETECTED Final   Parainfluenza Virus 1 NOT DETECTED NOT DETECTED Final   Parainfluenza Virus 2 NOT DETECTED NOT DETECTED Final   Parainfluenza Virus 3 NOT DETECTED NOT DETECTED Final   Parainfluenza Virus 4 NOT DETECTED NOT DETECTED Final   Respiratory Syncytial Virus NOT DETECTED NOT DETECTED Final   Bordetella pertussis NOT DETECTED NOT DETECTED Final   Chlamydophila pneumoniae NOT DETECTED NOT DETECTED Final   Mycoplasma pneumoniae NOT DETECTED NOT DETECTED Final    Comment: Performed at  [facility], [address] [place], [place] [address]  MRSA PCR Screening     Status: None   Collection Time: 12/19/18  5:53 PM  Result Value Ref Range Status   MRSA by PCR NEGATIVE NEGATIVE Final    Comment:        The GeneXpert MRSA Assay (FDA approved for NASAL specimens only), is one component of a comprehensive MRSA colonization surveillance  program. It is not intended to diagnose MRSA infection nor to guide or monitor treatment for MRSA infections. Performed at [facility], [place] [address], [place], [place] [address]   Culture, blood (routine x 2)     Status: None   Collection Time: 12/19/18  6:54 PM  Result Value Ref Range Status   Specimen Description BLOOD RIGHT HAND  Final   Special Requests   Final    BOTTLES DRAWN AEROBIC AND ANAEROBIC Blood Culture adequate volume   Culture   Final    NO GROWTH 5 DAYS Performed at [facility], [address] [place], [place] [address]    Report Status 12/24/2018 FINAL  Final  Culture, blood (routine x 2)     Status: None   Collection Time: 12/19/18  6:54 PM  Result Value Ref Range Status   Specimen Description BLOOD LEFT HAND  Final   Special Requests   Final    BOTTLES DRAWN AEROBIC AND ANAEROBIC Blood Culture adequate volume   Culture   Final    NO GROWTH 5 DAYS Performed at [facility], [place] [address] [place], [place] [address]    Report Status 12/24/2018 FINAL  Final  Culture, sputum-assessment     Status: None   Collection Time: 12/20/18 11:13 AM  Result Value Ref Range Status   Specimen Description SPUTUM  Final   Special Requests Immunocompromised  Final   Sputum evaluation   Final    THIS SPECIMEN IS ACCEPTABLE FOR SPUTUM CULTURE Performed at [facility], [place] [address], [place], [place] [address]    Report Status 12/20/2018 FINAL  Final  Culture, respiratory     Status: None   Collection Time: 12/20/18 11:13 AM  Result Value Ref Range Status   Specimen Description SPUTUM   Final   Special Requests Immunocompromised Reflexed from [id number]  Final   Gram Stain   Final    MODERATE WBC PRESENT, PREDOMINANTLY MONONUCLEAR FEW SQUAMOUS EPITHELIAL CELLS PRESENT FEW GRAM POSITIVE RODS RARE GRAM POSITIVE COCCI RARE GRAM NEGATIVE RODS    Culture   Final    FEW Consistent with normal respiratory flora. Performed at [facility], [place] [address], [place],  [address]    Report Status 12/22/2018 FINAL  Final     Scheduled Meds: . amiodarone  400 mg Oral BID   Followed by  . [START ON 12/30/2018] amiodarone  200 mg Oral BID   Followed by  . [START ON 01/07/2019] amiodarone  200 mg Oral Daily  . apixaban  10 mg Oral BID   Followed by  . [START ON 01/01/2019] apixaban  5 mg Oral BID  . carvedilol  6.25 mg Oral BID WC  . Chlorhexidine Gluconate Cloth  6 each Topical Daily  . furosemide  40 mg Oral Daily  . isosorbide-hydrALAZINE  1 tablet Oral TID  . sacubitril-valsartan  1 tablet Oral BID  . sodium chloride flush  10-40 mL Intracatheter Q12H  . sodium chloride flush  3 mL Intravenous Q12H  . spironolactone  25 mg Oral QHS     LOS: 8 days   Cherene Altes, MD [facility] Office  [phone] Pager - Text Page per Shea Evans  If 7PM-7AM, please contact night-coverage per Amion 12/25/2018, 2:19 PM

## 2018-12-25 NOTE — Progress Notes (Signed)
CARDIAC REHAB PHASE I   PRE:  Rate/Rhythm: SR 69  BP:  Sitting: 99/63     SaO2: 100% RA  MODE:  Ambulation: 150 ft   POST:  Rate/Rhythm: 72 SR  BP:  Sitting: 127/85     SaO2: 100% RA  1130-1205  Pt ambulated 150 ft with one assist.  2nd person to manage equipment.  Pt with slow and steady gait. Pt did not use walker or require any rest breaks.  Encouraged patient to walk multiple times with staff.  Verbalized understanding. Reviewed HF booklet and sodium handouts with patient. Verbalized understanding.   Noel Christmas, RN 12/25/2018 11:59 AM

## 2018-12-26 LAB — COMPREHENSIVE METABOLIC PANEL
ALT: 22 U/L (ref 0–44)
AST: 21 U/L (ref 15–41)
Albumin: 2.1 g/dL — ABNORMAL LOW (ref 3.5–5.0)
Alkaline Phosphatase: 40 U/L (ref 38–126)
Anion gap: 7 (ref 5–15)
BUN: 10 mg/dL (ref 6–20)
CO2: 23 mmol/L (ref 22–32)
Calcium: 8.5 mg/dL — ABNORMAL LOW (ref 8.9–10.3)
Chloride: 106 mmol/L (ref 98–111)
Creatinine, Ser: 1.04 mg/dL — ABNORMAL HIGH (ref 0.44–1.00)
GFR calc Af Amer: >60 mL/min (ref >60)
GFR calc non Af Amer: >60 mL/min (ref >60)
Glucose, Bld: 131 mg/dL — ABNORMAL HIGH (ref 70–99)
Potassium: 4.1 mmol/L (ref 3.5–5.1)
Sodium: 136 mmol/L (ref 135–145)
Total Bilirubin: 0.7 mg/dL (ref 0.3–1.2)
Total Protein: 5.9 g/dL — ABNORMAL LOW (ref 6.5–8.1)

## 2018-12-26 LAB — COOXEMETRY PANEL
Carboxyhemoglobin: 1.2 % (ref 0.5–1.5)
Methemoglobin: 1.5 % (ref 0.0–1.5)
O2 Saturation: 57.9 %
Total hemoglobin: 9.3 g/dL — ABNORMAL LOW (ref 12.0–16.0)

## 2018-12-26 LAB — CBC
HCT: 31.8 % — ABNORMAL LOW (ref 36.0–46.0)
Hemoglobin: 9.3 g/dL — ABNORMAL LOW (ref 12.0–15.0)
MCH: 22.4 pg — ABNORMAL LOW (ref 26.0–34.0)
MCHC: 29.2 g/dL — ABNORMAL LOW (ref 30.0–36.0)
MCV: 76.6 fL — ABNORMAL LOW (ref 80.0–100.0)
PLATELETS: 385 10*3/uL (ref 150–400)
RBC: 4.15 MIL/uL (ref 3.87–5.11)
RDW: 16.8 % — ABNORMAL HIGH (ref 11.5–15.5)
WBC: 6.5 10*3/uL (ref 4.0–10.5)
nRBC: 0 % (ref 0.0–0.2)

## 2018-12-26 LAB — MAGNESIUM: Magnesium: 1.9 mg/dL (ref 1.7–2.4)

## 2018-12-26 MED ORDER — SACUBITRIL-VALSARTAN 49-51 MG PO TABS
1.0000 | ORAL_TABLET | Freq: Two times a day (BID) | ORAL | Status: DC
  Administered 2018-12-26 – 2018-12-27 (×2): 1 via ORAL
  Filled 2018-12-26 (×2): qty 1

## 2018-12-26 NOTE — Progress Notes (Signed)
[place] TEAM 1 - Stepdown/ICU TEAM  [name] [name]  JKK:[id number] DOB: [birth year] DOA: 12/17/2018 PCP: Fanny Bien, MD    Brief Narrative:  72IR admitted on 3/6 with fever and dyspnea. Found to have a PE and new systolic heart failure.  Significant Events: 3/6 Admitted 3/7 TTE - EF 20-25% - reduced RV systolic fxn 3/8 SVT requiring ICU transfer for potential cardioversion 3/8 venous duplex - no LE DVT  3/12 Cardiac CT - calcium score 0  Subjective: The patient is resting comfortably in a bedside chair.  She tells me she got up and walked down the hall and around the nursing unit and back to her room with minimal help from her brother.  She denies chest pain shortness of breath fevers chills.  There is some disagreement between PT and OT in regard to recommendations for care post hospital.  We will continue to ambulate and follow her progress.  The CHF team has made some adjustments in her medications today.  We will monitor her an additional 24 hours to assure she tolerates these changes with a tentative plan for discharge home 3/16 if she remains clinically stable.  Assessment & Plan:  Acute systolic heart failure Care as per CHF Team - suspect NICM due to HTN -no evidence of CAD on coronary CT -continue Lasix and Aldactone -will require close follow-up in CHF clinic -meds being adjusted today  CAP complete 7 days of antibiotic therapy -clinically much improved /asymptomatic  Pulmonary embolism birth control and obesity related - heparin drip now transitioned to Eliquis- needs 6 months anticoag per PCCM   HTN BP is well controlled   PAF / SVT Amiodarone as per Cardiology - will be on anticoag for now - decision on long term anticoag to be made by Cards in f/u - plan to stop amiodarone after 30 days of treatment -maintaining normal sinus rhythm at this time  Suspected OSA Will need outpt sleep study   Morbid obesity - Body mass index is 36.75 kg/m.   DVT  prophylaxis: IV heparin  Code Status: FULL CODE Family Communication: no family present at time of exam  Disposition Plan: ambulate - possible d/c home 12/27/18  Consultants:  PCCM CHF Team   Antimicrobials:  Azithro 3/6 > 3/12 Maxipime 3/7 > 3/10 Vanc 3/7 > 3/8 Ceftriaxone 3/11 >   Objective: Blood pressure 134/90, pulse 77, temperature 97.8 F (36.6 C), temperature source Oral, resp. rate 17, height 5' 3.5" (1.613 m), weight 95.6 kg, SpO2 100 %.  Intake/Output Summary (Last 24 hours) at 12/26/2018 1152 Last data filed at 12/26/2018 1058 Gross per 24 hour  Intake 240 ml  Output -  Net 240 ml   Filed Weights   12/21/18 0800 12/22/18 0300 12/23/18 0600  Weight: 99.1 kg 95.3 kg 95.6 kg    Examination: General: No acute respiratory distress Lungs: Clear to auscultation bilaterally without wheezing Cardiovascular: RRR Abdomen: NT/ND, soft, bowel sounds positive Extremities: trace bilateral lower extremity edema   CBC: Recent Labs  Lab 12/24/18 0258 12/25/18 0611 12/26/18 1055  WBC 8.4 7.5 6.5  HGB 9.6* 9.1* 9.3*  HCT 32.1* 32.1* 31.8*  MCV 75.5* 77.9* 76.6*  PLT 303 363 678   Basic Metabolic Panel: Recent Labs  Lab 12/24/18 0258 12/25/18 0541 12/26/18 1055  NA 136 137 136  K 3.9 3.7 4.1  CL 104 103 106  CO2 23 25 23   GLUCOSE 96 100* 131*  BUN 11 9 10   CREATININE 1.01* 0.99 1.04*  CALCIUM 8.3* 8.7* 8.5*  MG  --   --  1.9   GFR: Estimated Creatinine Clearance: 87.1 mL/min (A) (by C-G formula based on SCr of 1.04 mg/dL (H)).  Liver Function Tests: Recent Labs  Lab 12/20/18 0411 12/25/18 0541 12/26/18 1055  AST 23 24 21   ALT 39 22 22  ALKPHOS 36* 40 40  BILITOT 1.8* 0.7 0.7  PROT 5.5* 5.5* 5.9*  ALBUMIN 2.1* 1.9* 2.1*    Recent Results (from the past 240 hour(s))  Culture, blood (routine x 2) Call MD if unable to obtain prior to antibiotics being given     Status: None   Collection Time: 12/18/18  9:00 AM  Result Value Ref Range Status    Specimen Description BLOOD LEFT HAND  Final   Special Requests   Final    BOTTLES DRAWN AEROBIC AND ANAEROBIC Blood Culture adequate volume   Culture   Final    NO GROWTH 5 DAYS Performed at [facility], [name] [place], [place] [address]    Report Status 12/23/2018 FINAL  Final  Culture, blood (routine x 2) Call MD if unable to obtain prior to antibiotics being given     Status: None   Collection Time: 12/18/18  9:50 AM  Result Value Ref Range Status   Specimen Description BLOOD RIGHT HAND  Final   Special Requests   Final    BOTTLES DRAWN AEROBIC ONLY Blood Culture results may not be optimal due to an inadequate volume of blood received in culture bottles   Culture   Final    NO GROWTH 5 DAYS Performed at [facility], [place] [address] [place], [place] [address]    Report Status 12/23/2018 FINAL  Final  Respiratory Panel by PCR     Status: None   Collection Time: 12/19/18  4:22 PM  Result Value Ref Range Status   Adenovirus NOT DETECTED NOT DETECTED Final   Coronavirus 229E NOT DETECTED NOT DETECTED Final    Comment: (NOTE) The Coronavirus on the Respiratory Panel, DOES NOT test for the novel  Coronavirus (2019 nCoV)    Coronavirus HKU1 NOT DETECTED NOT DETECTED Final   Coronavirus NL63 NOT DETECTED NOT DETECTED Final   Coronavirus OC43 NOT DETECTED NOT DETECTED Final   Metapneumovirus NOT DETECTED NOT DETECTED Final   Rhinovirus / Enterovirus NOT DETECTED NOT DETECTED Final   Influenza A NOT DETECTED NOT DETECTED Final   Influenza B NOT DETECTED NOT DETECTED Final   Parainfluenza Virus 1 NOT DETECTED NOT DETECTED Final   Parainfluenza Virus 2 NOT DETECTED NOT DETECTED Final   Parainfluenza Virus 3 NOT DETECTED NOT DETECTED Final   Parainfluenza Virus 4 NOT DETECTED NOT DETECTED Final   Respiratory Syncytial Virus NOT DETECTED NOT DETECTED Final   Bordetella pertussis NOT DETECTED NOT DETECTED Final   Chlamydophila pneumoniae NOT DETECTED NOT DETECTED  Final   Mycoplasma pneumoniae NOT DETECTED NOT DETECTED Final    Comment: Performed at [facility], [address], [place], [place] [address]  MRSA PCR Screening     Status: None   Collection Time: 12/19/18  5:53 PM  Result Value Ref Range Status   MRSA by PCR NEGATIVE NEGATIVE Final    Comment:        The GeneXpert MRSA Assay (FDA approved for NASAL specimens only), is one component of a comprehensive MRSA colonization surveillance program. It is not intended to diagnose MRSA infection nor to guide or monitor treatment for  MRSA infections. Performed at [facility], [place] [address], [place], [place] [address]   Culture, blood (routine x 2)     Status: None   Collection Time: 12/19/18  6:54 PM  Result Value Ref Range Status   Specimen Description BLOOD RIGHT HAND  Final   Special Requests   Final    BOTTLES DRAWN AEROBIC AND ANAEROBIC Blood Culture adequate volume   Culture   Final    NO GROWTH 5 DAYS Performed at [facility], [address] [place], [place] [address]    Report Status 12/24/2018 FINAL  Final  Culture, blood (routine x 2)     Status: None   Collection Time: 12/19/18  6:54 PM  Result Value Ref Range Status   Specimen Description BLOOD LEFT HAND  Final   Special Requests   Final    BOTTLES DRAWN AEROBIC AND ANAEROBIC Blood Culture adequate volume   Culture   Final    NO GROWTH 5 DAYS Performed at [facility], [place] [address] [place], [place] [address]    Report Status 12/24/2018 FINAL  Final  Culture, sputum-assessment     Status: None   Collection Time: 12/20/18 11:13 AM  Result Value Ref Range Status   Specimen Description SPUTUM  Final   Special Requests Immunocompromised  Final   Sputum evaluation   Final    THIS SPECIMEN IS ACCEPTABLE FOR SPUTUM CULTURE Performed at [facility], [place] [address] [place], [place] [address]    Report Status 12/20/2018 FINAL  Final  Culture, respiratory     Status: None   Collection  Time: 12/20/18 11:13 AM  Result Value Ref Range Status   Specimen Description SPUTUM  Final   Special Requests Immunocompromised Reflexed from [id number]  Final   Gram Stain   Final    MODERATE WBC PRESENT, PREDOMINANTLY MONONUCLEAR FEW SQUAMOUS EPITHELIAL CELLS PRESENT FEW GRAM POSITIVE RODS RARE GRAM POSITIVE COCCI RARE GRAM NEGATIVE RODS    Culture   Final    FEW Consistent with normal respiratory flora. Performed at [facility], [place] [address] [place], [place] [address]    Report Status 12/22/2018 FINAL  Final     Scheduled Meds: . amiodarone  400 mg Oral BID   Followed by  . [START ON 12/30/2018] amiodarone  200 mg Oral BID   Followed by  . [START ON 01/07/2019] amiodarone  200 mg Oral Daily  . apixaban  10 mg Oral BID   Followed by  . [START ON 01/01/2019] apixaban  5 mg Oral BID  . carvedilol  6.25 mg Oral BID WC  . Chlorhexidine Gluconate Cloth  6 each Topical Daily  . furosemide  40 mg Oral Daily  . isosorbide-hydrALAZINE  1 tablet Oral TID  . sacubitril-valsartan  1 tablet Oral BID  . sodium chloride flush  10-40 mL Intracatheter Q12H  . spironolactone  25 mg Oral QHS     LOS: 9 days   Cherene Altes, MD [facility] Office  [phone] Pager - Text Page per Shea Evans  If 7PM-7AM, please contact night-coverage per Amion 12/26/2018, 11:52 AM

## 2018-12-26 NOTE — Progress Notes (Signed)
Occupational Therapy Treatment Patient Details Name: [name] [name] MRN: [id number] DOB: [birth year] Today's Date: 12/26/2018    History of present illness Pt is a 33 y.o. female admitted with worsening SOB and hemoptysis. Chest CTA with R lobe pulmonary emboli +/- PNA. Developed SVT/afib with RVR requiring transfer to ICU. Also with acute systolic CHF; suspect NICM in setting of HTN. PMH includes pregnancy-induced HTN.   OT comments  Pt. Making great gains with skilled OT.  Reports feeling much better than previous therapy sessions.  Able to complete ub/lb b/d and grooming with min guard a.  Initiated rest breaks appropriately throughout session.    Follow Up Recommendations  Supervision/Assistance - 24 hour;CIR    Equipment Recommendations  3 in 1 bedside commode    Recommendations for Other Services      Precautions / Restrictions Precautions Precautions: Fall;Other (comment) Precaution Comments: Watch BP       Mobility Bed Mobility Overal bed mobility: Modified Independent Bed Mobility: Supine to Sit              Transfers Overall transfer level: Needs assistance Equipment used: Rolling walker (2 wheeled) Transfers: Sit to/from [facility] Sit to Stand: Min guard Stand pivot transfers: Min guard            Balance                                           ADL either performed or assessed with clinical judgement   ADL Overall ADL's : Needs assistance/impaired     Grooming: Wash/dry hands;Wash/dry face;Oral care;Min guard;Standing   Upper Body Bathing: Set up;Sitting;Standing   Lower Body Bathing: Min guard;Sitting/lateral leans;Sit to/from stand   Upper Body Dressing : Minimal assistance;Standing Upper Body Dressing Details (indicate cue type and reason): assistance only required due to IV lines Lower Body Dressing: Set up;Bed level Lower Body Dressing Details (indicate cue type and reason): donned socks in semi type  long sitting in bed before transitioning to eob Toilet Transfer: Min Marine scientist Details (indicate cue type and reason): sim. as pt. sit/stand from eob and was able to amb. around the bed to the sink (declined need for actual use of b.room, reports she used bsc prior to my arrival) Teacher, early years/pre- Water quality scientist and Hygiene: Min guard;Sit to/from stand Toileting - Clothing Manipulation Details (indicate cue type and reason): simulated during washing of peri areas in standing during her sponge bath at the sink     Functional mobility during ADLs: Min guard;Minimal assistance;Rolling walker General ADL Comments: reports feeling stronger than previous sessions.       Vision       Perception     Praxis      Cognition Arousal/Alertness: Awake/alert Behavior During Therapy: WFL for tasks assessed/performed Overall Cognitive Status: Within Functional Limits for tasks assessed                                          Exercises     Shoulder Instructions       General Comments      Pertinent Vitals/ Pain       Pain Assessment: No/denies pain  Home Living  Prior Functioning/Environment              Frequency  Min 3X/week        Progress Toward Goals  OT Goals(current goals can now be found in the care plan section)  Progress towards OT goals: Progressing toward goals     Plan Discharge plan remains appropriate;Frequency remains appropriate    Co-evaluation                 AM-PAC OT "6 Clicks" Daily Activity     Outcome Measure   Help from another person eating meals?: None Help from another person taking care of personal grooming?: A Little Help from another person toileting, which includes using toliet, bedpan, or urinal?: A Little Help from another person bathing (including washing, rinsing, drying)?: A Little Help from another person to put on and  taking off regular upper body clothing?: A Little Help from another person to put on and taking off regular lower body clothing?: A Little 6 Click Score: 19    End of Session Equipment Utilized During Treatment: Rolling walker  OT Visit Diagnosis: Unsteadiness on feet (R26.81);Other abnormalities of gait and mobility (R26.89);Muscle weakness (generalized) (M62.81);Pain Pain - Right/Left: Right   Activity Tolerance Patient tolerated treatment well   Patient Left in chair;with call bell/phone within reach;with nursing/sitter in room   Nurse Communication Other (comment)(reported to rn/cna that multiple leads had come off during sponge bath, cna present at end of session and reapplied)        Time: 3276-1470 OT Time Calculation (min): 29 min  Charges: OT General Charges $OT Visit: 1 Visit OT Treatments $Self Care/Home Management : 23-37 mins   Janice Coffin, COTA/L 12/26/2018, 8:11 AM

## 2018-12-26 NOTE — Progress Notes (Signed)
Patient ID: [name] [name], female   DOB: [birth year], 33 y.o.   MRN: [id number]     Advanced Heart Failure Rounding Note  PCP-Cardiologist: Dorris Carnes, MD   Subjective:    No complaints this morning, breathing much better.  Scant hemoptysis now.   Coronary CT 12/23/18 with Calcium score of 0 and no significant coronary disease.   Cardiac MRI:  1.  Moderate pericardial effusion without tamponade. 2. Mild-moderately dilated LV with mild LV hypertrophy. EF 16%, diffuse hypokinesis. Possible small LV thrombus. 3.  Mild RV dilation with EF 21%. 4.  Areas of non-coronary pattern LGE noted, ?myocarditis.  Objective:   Weight Range: 95.6 kg Body mass index is 36.75 kg/m.   Vital Signs:   Temp:  [97.8 F (36.6 C)-99.1 F (37.3 C)] 97.8 F (36.6 C) (03/15 0721) Pulse Rate:  [67-80] 77 (03/15 0721) Resp:  [15-21] 17 (03/15 0721) BP: (112-134)/(65-90) 134/90 (03/15 0721) SpO2:  [99 %-100 %] 100 % (03/15 0721) FiO2 (%):  [16 %-18 %] 18 % (03/15 0413) Last BM Date: 12/23/18  Weight change: Filed Weights   12/21/18 0800 12/22/18 0300 12/23/18 0600  Weight: 99.1 kg 95.3 kg 95.6 kg    Intake/Output:  No intake or output data in the 24 hours ending 12/26/18 0918  Physical Exam    General: NAD Neck: JVP 8 cm, no thyromegaly or thyroid nodule.  Lungs: Clear to auscultation bilaterally with normal respiratory effort. CV: Nondisplaced PMI.  Heart regular S1/S2, no S3/S4, no murmur.  No peripheral edema.  No carotid bruit.  Normal pedal pulses.  Abdomen: Soft, nontender, no hepatosplenomegaly, no distention.  Skin: Intact without lesions or rashes.  Neurologic: Alert and oriented x 3.  Psych: Normal affect. Extremities: No clubbing or cyanosis.  HEENT: Normal.    Telemetry   NSR 80s, personally reviewed.   EKG    No new tracings.    Labs    CBC Recent Labs    12/24/18 0258 12/25/18 0611  WBC 8.4 7.5  HGB 9.6* 9.1*  HCT 32.1* 32.1*  MCV 75.5* 77.9*  PLT 303 106    Basic Metabolic Panel Recent Labs    12/24/18 0258 12/25/18 0541  NA 136 137  K 3.9 3.7  CL 104 103  CO2 23 25  GLUCOSE 96 100*  BUN 11 9  CREATININE 1.01* 0.99  CALCIUM 8.3* 8.7*   Liver Function Tests Recent Labs    12/25/18 0541  AST 24  ALT 22  ALKPHOS 40  BILITOT 0.7  PROT 5.5*  ALBUMIN 1.9*   No results for input(s): LIPASE, AMYLASE in the last 72 hours. Cardiac Enzymes No results for input(s): CKTOTAL, CKMB, CKMBINDEX, TROPONINI in the last 72 hours.  BNP: BNP (last 3 results) Recent Labs    12/17/18 2014 12/19/18 0943  BNP 836.8* 192.4*   ProBNP (last 3 results) Recent Labs    12/16/18 1150  PROBNP 3,613*   D-Dimer No results for input(s): DDIMER in the last 72 hours. Hemoglobin A1C No results for input(s): HGBA1C in the last 72 hours. Fasting Lipid Panel No results for input(s): CHOL, HDL, LDLCALC, TRIG, CHOLHDL, LDLDIRECT in the last 72 hours. Thyroid Function Tests No results for input(s): TSH, T4TOTAL, T3FREE, THYROIDAB in the last 72 hours.  Invalid input(s): FREET3  Other results:  Imaging   No results found.  Medications:    Scheduled Medications: . amiodarone  400 mg Oral BID   Followed by  . [START ON 12/30/2018] amiodarone  200 mg  Oral BID   Followed by  . [START ON 01/07/2019] amiodarone  200 mg Oral Daily  . apixaban  10 mg Oral BID   Followed by  . [START ON 01/01/2019] apixaban  5 mg Oral BID  . carvedilol  6.25 mg Oral BID WC  . Chlorhexidine Gluconate Cloth  6 each Topical Daily  . furosemide  40 mg Oral Daily  . isosorbide-hydrALAZINE  1 tablet Oral TID  . sacubitril-valsartan  1 tablet Oral BID  . sodium chloride flush  10-40 mL Intracatheter Q12H  . spironolactone  25 mg Oral QHS    Infusions:   PRN Medications: acetaminophen, albuterol, guaiFENesin-codeine, hydrALAZINE, morphine injection, ondansetron **OR** ondansetron (ZOFRAN) IV, oxyCODONE, sodium chloride flush, traMADol  Patient Profile   [name]  [name] is a 33 y.o. female with h/o pregnancy induced HTN and suspected diastolic CHF. Admitted with worsening SOB and hemoptysis. Found to have acute PE +/- PNA with fever.   CHF team asked to see with marked volume overload and new systolic CHF.   Assessment/Plan   1. Acute on chronic systolic CHF: She has a nonischemic cardiomyopathy, coronary CTA showed no CAD. EF was reportedly low on echo at hospital in [facility] in 12/19. This admission, echo with EF 20-25% with moderate RV systolic dysfunction. As above, no recent pregnancy so doubt peri-partum CMP. No family history of CHF and coronary CTA showed no CAD.  Cardiac MRI showed LV EF 16% with possible small LV thrombus, RV EF 21%.  Delayed enhancement imaging showed non-coronary pattern LGE that could be consistent with myocarditis. Co-ox 57% this morning with SBP 120s generally.  - Can continue Lasix 40 mg daily.  - Increase Bidil to 1 tab tid.   -Continue spironolactone25 mg daily.  -Continue Coreg 6.25 mg bid. -Can increase Entresto to 49/51 bid.  - She will need close followup to assess her trajectory.  It is possible she could need advanced therapies down the road.  2. Pulmonary embolus: On right. She says that she has not been moving much at all for weeks due to exertional dyspnea. She has no family history of VTE.  - Transitioned to Eliquis.   3. Atrial fibrillation: Paroxysmal, currently in NSR. No prior history. Likely triggered by CHF and PE.  - Transitioned to Eliquis off heparin. - Now on po amiodarone.  Atrial fibrillation likely triggered by PE/active CHF/PNA.  Can stop amiodarone after 1 month and watch for recurrence.  4. PNA: Chest CT suggestive of PNA, not pulmonary infarction.Now afebrile.  - Abx course completed.   5. Moderate pericardial effusion: No tamponade. 6. Possible small LV thrombus: Will be anticoagulated.  7. Disposition: ?of rehab stay versus home, per patient, she has been walking  in hall without much trouble. She will need very close followup in CHF clinic. Cardiac meds for discharge: spironolactone 25 daily, Lasix 40 daily, Bidil 1 tab tid, Entresto 49/51 bid, Coreg 6.25 bid, apixaban, amiodarone 200 mg bid x 1 week then 200 mg daily.   Loralie Champagne 12/26/2018 9:18 AM

## 2018-12-27 LAB — CBC
HCT: 32 % — ABNORMAL LOW (ref 36.0–46.0)
Hemoglobin: 9.3 g/dL — ABNORMAL LOW (ref 12.0–15.0)
MCH: 22.4 pg — ABNORMAL LOW (ref 26.0–34.0)
MCHC: 29.1 g/dL — ABNORMAL LOW (ref 30.0–36.0)
MCV: 76.9 fL — ABNORMAL LOW (ref 80.0–100.0)
Platelets: 386 10*3/uL (ref 150–400)
RBC: 4.16 MIL/uL (ref 3.87–5.11)
RDW: 16.9 % — ABNORMAL HIGH (ref 11.5–15.5)
WBC: 7.1 10*3/uL (ref 4.0–10.5)
nRBC: 0 % (ref 0.0–0.2)

## 2018-12-27 LAB — COMPREHENSIVE METABOLIC PANEL
ALT: 23 U/L (ref 0–44)
AST: 21 U/L (ref 15–41)
Albumin: 2.2 g/dL — ABNORMAL LOW (ref 3.5–5.0)
Alkaline Phosphatase: 44 U/L (ref 38–126)
Anion gap: 5 (ref 5–15)
BUN: 11 mg/dL (ref 6–20)
CO2: 22 mmol/L (ref 22–32)
Calcium: 8.7 mg/dL — ABNORMAL LOW (ref 8.9–10.3)
Chloride: 110 mmol/L (ref 98–111)
Creatinine, Ser: 0.95 mg/dL (ref 0.44–1.00)
GFR calc Af Amer: >60 mL/min (ref >60)
GFR calc non Af Amer: >60 mL/min (ref >60)
Glucose, Bld: 100 mg/dL — ABNORMAL HIGH (ref 70–99)
Potassium: 4.4 mmol/L (ref 3.5–5.1)
Sodium: 137 mmol/L (ref 135–145)
Total Bilirubin: 0.8 mg/dL (ref 0.3–1.2)
Total Protein: 5.9 g/dL — ABNORMAL LOW (ref 6.5–8.1)

## 2018-12-27 LAB — COOXEMETRY PANEL
Carboxyhemoglobin: 1.3 % (ref 0.5–1.5)
Methemoglobin: 1.5 % (ref 0.0–1.5)
O2 Saturation: 58.3 %
Total hemoglobin: 9.2 g/dL — ABNORMAL LOW (ref 12.0–16.0)

## 2018-12-27 MED ORDER — ALTEPLASE 2 MG IJ SOLR
2.0000 mg | Freq: Once | INTRAMUSCULAR | Status: DC | PRN
  Filled 2018-12-27: qty 2

## 2018-12-27 MED ORDER — ACETAMINOPHEN 325 MG PO TABS: 650.0000 mg | ORAL_TABLET | Freq: Four times a day (QID) | ORAL | Status: DC | PRN

## 2018-12-27 MED ORDER — SACUBITRIL-VALSARTAN 49-51 MG PO TABS: 1.0000 | ORAL_TABLET | Freq: Two times a day (BID) | ORAL | 6 refills | Status: DC

## 2018-12-27 NOTE — Discharge Instructions (Addendum)
Information on my medicine - ELIQUIS (apixaban)  This medication education was reviewed with me or my healthcare representative as part of my discharge preparation.   Why was Eliquis prescribed for you? Eliquis was prescribed to treat blood clots that may have been found in the veins of your legs (deep vein thrombosis) or in your lungs (pulmonary embolism) and to reduce the risk of them occurring again.  What do You need to know about Eliquis ? The starting dose is 10 mg (two 5 mg tablets) taken TWICE daily for the FIRST SEVEN (7) DAYS, then the dose is reduced to ONE 5 mg tablet taken TWICE daily.  Eliquis may be taken with or without food.   Try to take the dose about the same time in the morning and in the evening. If you have difficulty swallowing the tablet whole please discuss with your pharmacist how to take the medication safely.  Take Eliquis exactly as prescribed and DO NOT stop taking Eliquis without talking to the doctor who prescribed the medication.  Stopping may increase your risk of developing a new blood clot.  Refill your prescription before you run out.  After discharge, you should have regular check-up appointments with your healthcare provider that is prescribing your Eliquis.    What do you do if you miss a dose? If a dose of ELIQUIS is not taken at the scheduled time, take it as soon as possible on the same day and twice-daily administration should be resumed. The dose should not be doubled to make up for a missed dose.  Important Safety Information A possible side effect of Eliquis is bleeding. You should call your healthcare provider right away if you experience any of the following: ? Bleeding from an injury or your nose that does not stop. ? Unusual colored urine (red or dark brown) or unusual colored stools (red or black). ? Unusual bruising for unknown reasons. ? A serious fall or if you hit your head (even if there is no bleeding).  Some medicines may  interact with Eliquis and might increase your risk of bleeding or clotting while on Eliquis. To help avoid this, consult your healthcare provider or pharmacist prior to using any new prescription or non-prescription medications, including herbals, vitamins, non-steroidal anti-inflammatory drugs (NSAIDs) and supplements.  This website has more information on Eliquis (apixaban): http://www.eliquis.com/eliquis/home   Pulmonary Embolism  A pulmonary embolism (PE) is a sudden blockage or decrease of blood flow in one lung or both lungs. Most blockages come from a blood clot that forms in a lower leg, thigh, or arm vein (deep vein thrombosis, DVT) and travels to the lungs. A clot is blood that has thickened into a gel or solid. PE is a dangerous and life-threatening condition that needs to be treated right away. What are the causes? This condition is usually caused by a blood clot that forms in a vein and moves to the lungs. In rare cases, it may be caused by air, fat, part of a tumor, or other tissue that moves through the veins and into the lungs. What increases the risk? The following factors may make you more likely to develop this condition:  Traumatic injury, such as breaking a hip or leg.  Spinal cord injury.  Orthopedic surgery, especially hip or knee replacement.  Any major surgery.  Stroke.  Having DVT.  Blood clots or blood clotting disease.  Long-term (chronic) lung or heart disease.  Taking medicines that contain estrogen. These include birth control pills  and hormone replacement therapy.  Cancer and chemotherapy.  Having a central venous catheter.  Pregnancy and the period of time after delivery (postpartum).  Being older than age 68.  Being overweight.  Smoking. What are the signs or symptoms? Symptoms of this condition usually start suddenly and include:  Shortness of breath during activity or at rest.  Coughing or coughing up blood or blood-tinged  mucus.  Chest pain that is often worse with deep breaths.  Rapid or irregular heartbeat.  Feeling light-headed or dizzy.  Fainting.  Feeling anxious.  Fever.  Sweating.  Pain and swelling in a leg. This is a symptom of DVT, which can lead to PE. How is this diagnosed? This condition may be diagnosed based on:  Your medical history.  A physical exam.  Blood tests.  CT pulmonary angiogram. This test checks blood flow in and around your lungs.  Ventilation-perfusion scan, also called a lung VQ scan. This test measures air flow and blood flow to the lungs.  Ultrasound of the legs. How is this treated? Treatment for this condition depends on many factors, such as the cause of your PE, your risk for bleeding or developing more clots, and other medical conditions you have. Treatment aims to remove, dissolve, or stop blood clots from forming or growing larger. Treatment may include:  Medicines, such as: ? Blood thinning medicines (anticoagulants) to stop clots from forming or growing. ? Medicines that dissolve clots (thrombolytics).  Procedures, such as: ? Using a flexible tube to remove a blood clot (embolectomy) or deliver medicine to destroy it (catheter-directed thrombolysis). ? Inserting a filter into a large vein that carries blood to the heart (inferior vena cava). This filter (vena cava filter) catches blood clots before they reach the lungs. ? Surgery to remove the clot (surgical embolectomy). This is rare. You may need a combination of immediate, long-term (up to 3 months after diagnosis), and extended (more than 3 months after diagnosis) treatments. Your treatment may continue for several months (maintenance therapy). You and your health care provider will work together to choose the treatment program that is best for you. Follow these instructions at home: Medicines  Take over-the-counter and prescription medicines only as told by your health care provider.  If you  are taking an anticoagulant medicine: ? Take the medicine every day at the same time each day. ? Understand what foods and drugs interact with your medicine. ? Understand the side effects of this medicine, including excessive bruising or bleeding. Ask your health care provider or pharmacist about other side effects. General instructions  Wear a medical alert bracelet or carry a medical alert card that says you have had a PE and lists what medicines you take.  Ask your health care provider when you may return to your normal activities. Avoid sitting or lying for a long time without moving.  Maintain a healthy weight. Ask your health care provider what weight is healthy for you.  Do not use any products that contain nicotine or tobacco, such as cigarettes and e-cigarettes. If you need help quitting, ask your health care provider.  Talk with your health care provider about any travel plans. It is important to make sure that you are still able to take your medicine while on trips.  Keep all follow-up visits as told by your health care provider. This is important. Contact a health care provider if:  You missed a dose of your blood thinner medicine. Get help right away if:  You have: ?  New or increased pain, swelling, warmth, or redness in an arm or leg. ? Numbness or tingling in an arm or leg. ? Shortness of breath during activity or at rest. ? A fever. ? Chest pain. ? A rapid or irregular heartbeat. ? A severe headache. ? Vision changes. ? A serious fall or accident, or you hit your head. ? Stomach (abdominal) pain. ? Blood in your vomit, stool, or urine. ? A cut that will not stop bleeding.  You cough up blood.  You feel light-headed or dizzy.  You cannot move your arms or legs.  You are confused or have memory loss. These symptoms may represent a serious problem that is an emergency. Do not wait to see if the symptoms will go away. Get medical help right away. Call your local  emergency services (911 in the [place] Do not drive yourself to the hospital. Summary  A pulmonary embolism (PE) is a sudden blockage or decrease of blood flow in one lung or both lungs. PE is a dangerous and life-threatening condition that needs to be treated right away.  Treatments for this condition usually include medicines to thin your blood (anticoagulants) or medicines to break apart blood clots (thrombolytics).  If you are given blood thinners, it is important to take the medicine every single day at the same time each day.  If you have signs of PE or DVT, call your local emergency services (911 in the [place] This information is not intended to replace advice given to you by your health care provider. Make sure you discuss any questions you have with your health care provider. Document Released: 09/26/2000 Document Revised: 05/14/2018 Document Reviewed: 11/12/2017 Elsevier Interactive Patient Education  2019 [employer]    Heart Failure Heart failure is a condition in which the heart has trouble pumping blood because it has become weak or stiff. This means that the heart does not pump blood efficiently for the body to work well. For some people with heart failure, fluid may back up into the lungs and there may be swelling (edema) in the lower legs. Heart failure is usually a long-term (chronic) condition. It is important for you to take good care of yourself and follow the treatment plan from your health care provider. What are the causes? This condition is caused by some health problems, including:  High blood pressure (hypertension). Hypertension causes the heart muscle to work harder than normal. High blood pressure eventually causes the heart to become stiff and weak.  Coronary artery disease (CAD). CAD is the buildup of cholesterol and fat (plaques) in the arteries of the heart.  Heart attack (myocardial infarction). Injured tissue, which is caused by the heart attack, does  not contract as well and the heart's ability to pump blood is weakened.  Abnormal heart valves. When the heart valves do not open and close properly, the heart muscle must pump harder to keep the blood flowing.  Heart muscle disease (cardiomyopathy or myocarditis). Heart muscle disease is damage to the heart muscle from a variety of causes, such as drug or alcohol abuse, infections, or unknown causes. These can increase the risk of heart failure.  Lung disease. When the lungs do not work properly, the heart must work harder. What increases the risk? Risk of heart failure increases as a person ages. This condition is also more likely to develop in people who:  Are overweight.  Are female.  Smoke or chew tobacco.  Abuse alcohol or illegal drugs.  Have taken  medicines that can damage the heart, such as chemotherapy drugs.  Have diabetes. ? High blood sugar (glucose) is associated with high fat (lipid) levels in the blood. ? Diabetes can also damage tiny blood vessels that carry nutrients to the heart muscle.  Have abnormal heart rhythms.  Have thyroid problems.  Have low blood counts (anemia). What are the signs or symptoms? Symptoms of this condition include:  Shortness of breath with activity, such as when climbing stairs.  Persistent cough.  Swelling of the feet, ankles, legs, or abdomen.  Unexplained weight gain.  Difficulty breathing when lying flat (orthopnea).  Waking from sleep because of the need to sit up and get more air.  Rapid heartbeat.  Fatigue and loss of energy.  Feeling light-headed, dizzy, or close to fainting.  Loss of appetite.  Nausea.  Increased urination during the night (nocturia).  Confusion. How is this diagnosed? This condition is diagnosed based on:  Medical history, symptoms, and a physical exam.  Diagnostic tests, which may include: ? Echocardiogram. ? Electrocardiogram (ECG). ? Chest X-ray. ? Blood tests. ? Exercise stress  test. ? Radionuclide scans. ? Cardiac catheterization and angiogram. How is this treated? Treatment for this condition is aimed at managing the symptoms of heart failure. Medicines, behavioral changes, or other treatments may be necessary to treat heart failure. Medicines These may include:  Angiotensin-converting enzyme (ACE) inhibitors. This type of medicine blocks the effects of a blood protein called angiotensin-converting enzyme. ACE inhibitors relax (dilate) the blood vessels and help to lower blood pressure.  Angiotensin receptor blockers (ARBs). This type of medicine blocks the actions of a blood protein called angiotensin. ARBs dilate the blood vessels and help to lower blood pressure.  Water pills (diuretics). Diuretics cause the kidneys to remove salt and water from the blood. The extra fluid is removed through urination, leaving a lower volume of blood that the heart has to pump.  Beta blockers. These improve heart muscle strength and they prevent the heart from beating too quickly.  Digoxin. This increases the force of the heartbeat. Healthy behavior changes These may include:  Reaching and maintaining a healthy weight.  Stopping smoking or chewing tobacco.  Eating heart-healthy foods.  Limiting or avoiding alcohol.  Stopping use of street drugs (illegal drugs).  Physical activity. Other treatments These may include:  Surgery to open blocked coronary arteries or repair damaged heart valves.  Placement of a biventricular pacemaker to improve heart muscle function (cardiac resynchronization therapy). This device paces both the right ventricle and left ventricle.  Placement of a device to treat serious abnormal heart rhythms (implantable cardioverter defibrillator, or ICD).  Placement of a device to improve the pumping ability of the heart (left ventricular assist device, or LVAD).  Heart transplant. This can cure heart failure, and it is considered for certain  patients who do not improve with other therapies. Follow these instructions at home: Medicines  Take over-the-counter and prescription medicines only as told by your health care provider. Medicines are important in reducing the workload of your heart, slowing the progression of heart failure, and improving your symptoms. ? Do not stop taking your medicine unless your health care provider told you to do that. ? Do not skip any dose of medicine. ? Refill your prescriptions before you run out of medicine. You need your medicines every day. Eating and drinking   Eat heart-healthy foods. Talk with a dietitian to make an eating plan that is right for you. ? Choose foods that contain  no trans fat and are low in saturated fat and cholesterol. Healthy choices include fresh or frozen fruits and vegetables, fish, lean meats, legumes, fat-free or low-fat dairy products, and whole-grain or high-fiber foods. ? Limit salt (sodium) if directed by your health care provider. Sodium restriction may reduce symptoms of heart failure. Ask a dietitian to recommend heart-healthy seasonings. ? Use healthy cooking methods instead of frying. Healthy methods include roasting, grilling, broiling, baking, poaching, steaming, and stir-frying.  Limit your fluid intake if directed by your health care provider. Fluid restriction may reduce symptoms of heart failure. Lifestyle   Stop smoking or using chewing tobacco. Nicotine and tobacco can damage your heart and your blood vessels. Do not use nicotine gum or patches before talking to your health care provider.  Limit alcohol intake to no more than 1 drink per day for non-pregnant women and 2 drinks per day for men. One drink equals 12 oz of beer, 5 oz of wine, or 1 oz of hard liquor. ? Drinking more than that is harmful to your heart. Tell your health care provider if you drink alcohol several times a week. ? Talk with your health care provider about whether any level of  alcohol use is safe for you. ? If your heart has already been damaged by alcohol or you have severe heart failure, drinking alcohol should be stopped completely.  Stop use of illegal drugs.  Lose weight if directed by your health care provider. Weight loss may reduce symptoms of heart failure.  Do moderate physical activity if directed by your health care provider. People who are elderly and people with severe heart failure should consult with a health care provider for physical activity recommendations. Monitor important information   Weigh yourself every day. Keeping track of your weight daily helps you to notice excess fluid sooner. ? Weigh yourself every morning after you urinate and before you eat breakfast. ? Wear the same amount of clothing each time you weigh yourself. ? Record your daily weight. Provide your health care provider with your weight record.  Monitor and record your blood pressure as told by your health care provider.  Check your pulse as told by your health care provider. Dealing with extreme temperatures  If the weather is extremely hot: ? Avoid vigorous physical activity. ? Use air conditioning or fans or seek a cooler location. ? Avoid caffeine and alcohol. ? Wear loose-fitting, lightweight, and light-colored clothing.  If the weather is extremely cold: ? Avoid vigorous physical activity. ? Layer your clothes. ? Wear mittens or gloves, a hat, and a scarf when you go outside. ? Avoid alcohol. General instructions  Manage other health conditions such as hypertension, diabetes, thyroid disease, or abnormal heart rhythms as told by your health care provider.  Learn to manage stress. If you need help to do this, ask your health care provider.  Plan rest periods when fatigued.  Get ongoing education and support as needed.  Participate in or seek rehabilitation as needed to maintain or improve independence and quality of life.  Stay up to date with  immunizations. Keeping current on pneumococcal and influenza immunizations is especially important to prevent respiratory infections.  Keep all follow-up visits as told by your health care provider. This is important. Contact a health care provider if:  You have a rapid weight gain.  You have increasing shortness of breath that is unusual for you.  You are unable to participate in your usual physical activities.  You tire easily.  You cough more than normal, especially with physical activity.  You have any swelling or more swelling in areas such as your hands, feet, ankles, or abdomen.  You are unable to sleep because it is hard to breathe.  You feel like your heart is beating quickly (palpitations).  You become dizzy or light-headed when you stand up. Get help right away if:  You have difficulty breathing.  You notice or your family notices a change in your awareness, such as having trouble staying awake or having difficulty with concentration.  You have pain or discomfort in your chest.  You have an episode of fainting (syncope). This information is not intended to replace advice given to you by your health care provider. Make sure you discuss any questions you have with your health care provider. Document Released: 09/29/2005 Document Revised: 08/28/2017 Document Reviewed: 04/23/2016 Elsevier Interactive Patient Education  [employer].

## 2018-12-27 NOTE — Progress Notes (Signed)
VAST RN consulted to declot PICC. Upon arriving at pt's bedside, noted primary lumen of PICC (red port) was clamped, but had no cap on the end; pt unaware of when line left open. Scrubbed hub well and placed needleless cap on line. Red port with GBR and flushed easily. VAST RN notified pt's nurse as well as charge nurse and reiterated importance of caps on all lines to decrease infection risk.

## 2018-12-27 NOTE — Progress Notes (Signed)
Physical Therapy Treatment Patient Details Name: [name] [name] MRN: [id number] DOB: 1986/01/17 Today's Date: 12/27/2018    History of Present Illness Pt is a 33 y.o. female admitted with worsening SOB and hemoptysis. Chest CTA with R lobe pulmonary emboli +/- PNA. Developed SVT/afib with RVR requiring transfer to ICU. Also with acute systolic CHF; suspect NICM in setting of HTN. PMH includes pregnancy-induced HTN.    PT Comments    Patient improving dramatically from prior PT session, now ambulating unit and stairs without assistance. HR 110 max during visit, PVC's noted on tele, notified RN. Patient without c/o or symptoms, WNL SpO2 on RA.    Follow Up Recommendations  Home health PT;Supervision/Assistance - 24 hour     Equipment Recommendations  Rolling walker with 5" wheels    Recommendations for Other Services       Precautions / Restrictions Precautions Precautions: Fall;Other (comment) Restrictions Weight Bearing Restrictions: No    Mobility  Bed Mobility Overal bed mobility: Modified Independent                Transfers Overall transfer level: Modified independent                  Ambulation/Gait Ambulation/Gait assistance: Modified independent (Device/Increase time) Gait Distance (Feet): 300 Feet Assistive device: None Gait Pattern/deviations: Step-through pattern;Decreased stride length         Stairs             Wheelchair Mobility    Modified Rankin (Stroke Patients Only)       Balance Overall balance assessment: Needs assistance Sitting-balance support: No upper extremity supported;Feet supported Sitting balance-Leahy Scale: Fair Sitting balance - Comments: EOB   Standing balance support: Bilateral upper extremity supported;During functional activity Standing balance-Leahy Scale: Poor Standing balance comment: Reliant on UE support                            Cognition Arousal/Alertness:  Awake/alert Behavior During Therapy: WFL for tasks assessed/performed Overall Cognitive Status: Within Functional Limits for tasks assessed                                        Exercises      General Comments        Pertinent Vitals/Pain Pain Assessment: No/denies pain    Home Living                      Prior Function            PT Goals (current goals can now be found in the care plan section) Acute Rehab PT Goals Patient Stated Goal: to get home to her son PT Goal Formulation: With patient Time For Goal Achievement: 01/03/19 Potential to Achieve Goals: Good Progress towards PT goals: Progressing toward goals    Frequency    Min 3X/week      PT Plan Current plan remains appropriate    Co-evaluation              AM-PAC PT "6 Clicks" Mobility   Outcome Measure  Help needed turning from your back to your side while in a flat bed without using bedrails?: None Help needed moving from lying on your back to sitting on the side of a flat bed without using bedrails?: None Help needed moving to and from a bed to a  chair (including a wheelchair)?: None Help needed standing up from a chair using your arms (e.g., wheelchair or bedside chair)?: None Help needed to walk in hospital room?: None Help needed climbing 3-5 steps with a railing? : A Little 6 Click Score: 23    End of Session Equipment Utilized During Treatment: Gait belt Activity Tolerance: Patient tolerated treatment well Patient left: in chair;with call bell/phone within reach Nurse Communication: Mobility status PT Visit Diagnosis: Difficulty in walking, not elsewhere classified (R26.2);Muscle weakness (generalized) (M62.81)     Time: 8828-0034 PT Time Calculation (min) (ACUTE ONLY): 22 min  Charges:  $Gait Training: 8-22 mins                     Reinaldo Berber, PT, DPT Acute Rehabilitation Services Pager: [phone] Office: [phone]     Reinaldo Berber 12/27/2018, 10:42 AM

## 2018-12-27 NOTE — Discharge Summary (Signed)
DISCHARGE SUMMARY  [name] [name]  MR#: [id number]  DOB:1986/08/26  Date of Admission: 12/17/2018 Date of Discharge: 12/27/2018  Attending Physician:Jeffrey Hennie Duos, MD  Patient's OEU:MPNTI, Mechele Claude, MD  Consults: PCCM CHF Team   Disposition: D/C home   Follow-up Appts: Follow-up Information    [facility].   Specialty:  Home Health Services Why:  Home Health register nurse Contact information: [place] [address] [phone]        [place] Follow up on 01/05/2019.   Specialty:  Cardiology Why:  Heart Failure F/U 01/05/19 @ 1130 -Parking in ER lot (enter under blue awning to left of ER), or underneath [place] in the [place] on [address] (garage code:[id number]  , elevator to 1st floor).  -Take all am meds and bring all med bottles Contact information: [address] [id number] [place]       Fanny Bien, MD Follow up in 1 week(s).   Specialty:  Family Medicine Contact information: [address] [place] [address] [phone]        Fay Records, MD .   Specialty:  Cardiology Contact information: [place] [address] [phone]           Tests Needing Follow-up: -assess for tolerance of newly started DOAC -f/u lytes and renal fxn w/ pt on numerous new CHF meds/diuretics/ACE/ARB -assess HR control and rhythm   Discharge Diagnoses: Acute systolic heart failure CAP Pulmonary embolism HTN PAF / SVT Suspected OSA Morbid obesity - Body mass index is 36.75 kg/m.  Initial presentation: 32yo admitted on 3/6 with fever and dyspnea. Found to have a PE and new systolic heart failure.  Hospital Course: 3/6 Admitted 3/7 TTE - EF 20-25% - reduced RV systolic fxn 3/8 SVT requiring ICU transfer for potential cardioversion 3/8 venous duplex -  no LE DVT  3/12 Cardiac CT - calcium score 0  Acute systolic heart failure Care as per CHF Team - suspect NICM due to HTN - no evidence of CAD on coronary CT - will require close follow-up in CHF clinic - cleared for d/c by CHF Team - cardiac meds as per CHF Team   CAP complete 7 days of antibiotic therapy - clinically much improved /asymptomatic at time of d/c   Pulmonary embolism birth control and obesity related - heparin drip transitioned to Eliquis - needs 6 months anticoag per PCCM - clinically stable at time of d/c   HTN BP is well controlled at time of d/c   PAF / SVT Amiodarone as per Cardiology - will be on anticoag for now - decision on long term anticoag to be made by Cards in f/u - plan to stop amiodarone after 30 days of treatment - maintaining normal sinus rhythm at time of d/c   Suspected OSA Will need outpt sleep study   Morbid obesity - Body mass index is 36.75 kg/m.  Allergies as of 12/27/2018   No Known Allergies     Medication List    STOP taking these medications   amLODipine 10 MG tablet Commonly known as:  NORVASC   colchicine 0.6 MG tablet   etonogestrel 68 MG Impl implant Commonly known as:  NEXPLANON   lisinopril 40 MG tablet Commonly known as:  PRINIVIL,ZESTRIL     TAKE these medications   acetaminophen 325 MG tablet Commonly known  as:  TYLENOL Take 2 tablets (650 mg total) by mouth every 6 (six) hours as needed for mild pain, fever or headache.   amiodarone 200 MG tablet Commonly known as:  PACERONE Take 2 tablets (400 mg total) by mouth 2 (two) times daily. For 7 days, then 200 mg (1 tab) twice daily for 7 day then 200 mg (1 tab) daily. Start taking on:  December 30, 2018   carvedilol 6.25 MG tablet Commonly known as:  COREG Take 1 tablet (6.25 mg total) by mouth 2 (two) times daily with a meal. What changed:    medication strength  how much to take  when to take this   Eliquis DVT/PE Starter Pack 5 MG Tabs Take as  directed on package: start with two-5mg  tablets twice daily for 7 days. On day 8, switch to one-5mg  tablet twice daily.   ferrous sulfate 325 (65 FE) MG tablet Take 1 tablet (325 mg total) by mouth daily with breakfast.   furosemide 40 MG tablet Commonly known as:  LASIX Take 1 tablet (40 mg total) by mouth daily.   isosorbide-hydrALAZINE 20-37.5 MG tablet Commonly known as:  BIDIL Take 0.5-1 tablets by mouth 3 (three) times daily. Start with 0.5 mg three times a day until instructed to increase by heart failure MD   sacubitril-valsartan 49-51 MG Commonly known as:  ENTRESTO Take 1 tablet by mouth 2 (two) times daily.   spironolactone 25 MG tablet Commonly known as:  ALDACTONE Take 1 tablet (25 mg total) by mouth at bedtime. What changed:  when to take this       Day of Discharge BP 119/76 (BP Location: Right Arm)   Pulse 73   Temp 98.5 F (36.9 C) (Oral)   Resp (!) 27   Ht 5' 3.5" (1.613 m)   Wt 95.6 kg Comment: pt refused standing scale  SpO2 100%   BMI 36.75 kg/m   Physical Exam: General: No acute respiratory distress Lungs: Clear to auscultation bilaterally without wheezes or crackles Cardiovascular: RRR - no M or rub  Abdomen: Nontender, nondistended, soft, bowel sounds positive, no rebound, no ascites, no appreciable mass Extremities: trace B LE edema   Basic Metabolic Panel: Recent Labs  Lab 12/23/18 0257 12/24/18 0258 12/25/18 0541 12/26/18 1055 12/27/18 0217  NA 134* 136 137 136 137  K 4.0 3.9 3.7 4.1 4.4  CL 100 104 103 106 110  CO2 25 23 25 23 22   GLUCOSE 85 96 100* 131* 100*  BUN 11 11 9 10 11   CREATININE 1.08* 1.01* 0.99 1.04* 0.95  CALCIUM 8.1* 8.3* 8.7* 8.5* 8.7*  MG  --   --   --  1.9  --     Liver Function Tests: Recent Labs  Lab 12/25/18 0541 12/26/18 1055 12/27/18 0217  AST 24 21 21   ALT 22 22 23   ALKPHOS 40 40 44  BILITOT 0.7 0.7 0.8  PROT 5.5* 5.9* 5.9*  ALBUMIN 1.9* 2.1* 2.2*    CBC: Recent Labs  Lab 12/23/18 0257  12/24/18 0258 12/25/18 0611 12/26/18 1055 12/27/18 0217  WBC 10.0 8.4 7.5 6.5 7.1  HGB 9.9* 9.6* 9.1* 9.3* 9.3*  HCT 32.9* 32.1* 32.1* 31.8* 32.0*  MCV 74.8* 75.5* 77.9* 76.6* 76.9*  PLT 296 303 363 385 386    BNP (last 3 results) Recent Labs    12/17/18 2014 12/19/18 0943  BNP 836.8* 192.4*    ProBNP (last 3 results) Recent Labs    12/16/18 1150  PROBNP 3,613*  Recent Results (from the past 240 hour(s))  Culture, blood (routine x 2) Call MD if unable to obtain prior to antibiotics being given     Status: None   Collection Time: 12/18/18  9:00 AM  Result Value Ref Range Status   Specimen Description BLOOD LEFT HAND  Final   Special Requests   Final    BOTTLES DRAWN AEROBIC AND ANAEROBIC Blood Culture adequate volume   Culture   Final    NO GROWTH 5 DAYS Performed at [facility], [address], [place], [place] [address]    Report Status 12/23/2018 FINAL  Final  Culture, blood (routine x 2) Call MD if unable to obtain prior to antibiotics being given     Status: None   Collection Time: 12/18/18  9:50 AM  Result Value Ref Range Status   Specimen Description BLOOD RIGHT HAND  Final   Special Requests   Final    BOTTLES DRAWN AEROBIC ONLY Blood Culture results may not be optimal due to an inadequate volume of blood received in culture bottles   Culture   Final    NO GROWTH 5 DAYS Performed at [facility], [place] [address], [place], [place] [address]    Report Status 12/23/2018 FINAL  Final  Respiratory Panel by PCR     Status: None   Collection Time: 12/19/18  4:22 PM  Result Value Ref Range Status   Adenovirus NOT DETECTED NOT DETECTED Final   Coronavirus 229E NOT DETECTED NOT DETECTED Final    Comment: (NOTE) The Coronavirus on the Respiratory Panel, DOES NOT test for the novel  Coronavirus (2019 nCoV)    Coronavirus HKU1 NOT DETECTED NOT DETECTED Final   Coronavirus NL63 NOT DETECTED NOT DETECTED Final   Coronavirus OC43 NOT DETECTED NOT  DETECTED Final   Metapneumovirus NOT DETECTED NOT DETECTED Final   Rhinovirus / Enterovirus NOT DETECTED NOT DETECTED Final   Influenza A NOT DETECTED NOT DETECTED Final   Influenza B NOT DETECTED NOT DETECTED Final   Parainfluenza Virus 1 NOT DETECTED NOT DETECTED Final   Parainfluenza Virus 2 NOT DETECTED NOT DETECTED Final   Parainfluenza Virus 3 NOT DETECTED NOT DETECTED Final   Parainfluenza Virus 4 NOT DETECTED NOT DETECTED Final   Respiratory Syncytial Virus NOT DETECTED NOT DETECTED Final   Bordetella pertussis NOT DETECTED NOT DETECTED Final   Chlamydophila pneumoniae NOT DETECTED NOT DETECTED Final   Mycoplasma pneumoniae NOT DETECTED NOT DETECTED Final    Comment: Performed at [facility], [address] [place], [place] [address]  MRSA PCR Screening     Status: None   Collection Time: 12/19/18  5:53 PM  Result Value Ref Range Status   MRSA by PCR NEGATIVE NEGATIVE Final    Comment:        The GeneXpert MRSA Assay (FDA approved for NASAL specimens only), is one component of a comprehensive MRSA colonization surveillance program. It is not intended to diagnose MRSA infection nor to guide or monitor treatment for MRSA infections. Performed at [facility], [place] [address] [place], [place] [address]   Culture, blood (routine x 2)     Status: None   Collection Time: 12/19/18  6:54 PM  Result Value Ref Range Status   Specimen Description BLOOD RIGHT HAND  Final   Special Requests   Final    BOTTLES DRAWN AEROBIC AND ANAEROBIC Blood Culture adequate volume   Culture   Final    NO  GROWTH 5 DAYS Performed at [facility], [place] [address], [place], [place] [address]    Report Status 12/24/2018 FINAL  Final  Culture, blood (routine x 2)     Status: None   Collection Time: 12/19/18  6:54 PM  Result Value Ref Range Status   Specimen Description BLOOD LEFT HAND  Final   Special Requests   Final    BOTTLES DRAWN AEROBIC AND ANAEROBIC Blood Culture  adequate volume   Culture   Final    NO GROWTH 5 DAYS Performed at [facility], [place] [address] [place], [place] [address]    Report Status 12/24/2018 FINAL  Final  Culture, sputum-assessment     Status: None   Collection Time: 12/20/18 11:13 AM  Result Value Ref Range Status   Specimen Description SPUTUM  Final   Special Requests Immunocompromised  Final   Sputum evaluation   Final    THIS SPECIMEN IS ACCEPTABLE FOR SPUTUM CULTURE Performed at [facility], [place] [address] [place], [place] [address]    Report Status 12/20/2018 FINAL  Final  Culture, respiratory     Status: None   Collection Time: 12/20/18 11:13 AM  Result Value Ref Range Status   Specimen Description SPUTUM  Final   Special Requests Immunocompromised Reflexed from [id number]  Final   Gram Stain   Final    MODERATE WBC PRESENT, PREDOMINANTLY MONONUCLEAR FEW SQUAMOUS EPITHELIAL CELLS PRESENT FEW GRAM POSITIVE RODS RARE GRAM POSITIVE COCCI RARE GRAM NEGATIVE RODS    Culture   Final    FEW Consistent with normal respiratory flora. Performed at [facility], [place] [address] [place], [place] [address]    Report Status 12/22/2018 FINAL  Final     Time spent in discharge (includes decision making & examination of pt): 35 minutes  12/27/2018, 11:17 AM   Cherene Altes, MD [facility] Office  [phone]

## 2018-12-27 NOTE — Progress Notes (Signed)
CARDIAC REHAB PHASE I   PRE:  Rate/Rhythm: 72 SR    BP: sitting 141/87    SaO2: 100 RA  MODE:  Ambulation: 310 ft   POST:  Rate/Rhythm: 101 ST    BP: sitting 155/107     SaO2: 100 RA  Pt feeling much better. Able to walk independently at steady pace. No c/o. BP elevated but has not received meds yet. To recliner. Ed completed, reviewing and performing teach back on HF booklet. She is working on changing her diet. Will refer to [place], ACSM 12/27/2018 9:08 AM

## 2018-12-27 NOTE — Progress Notes (Signed)
Patient ID: [name] [name], female   DOB: 29-Nov-1985, 33 y.o.   MRN: [id number]     Advanced Heart Failure Rounding Note  PCP-Cardiologist: Dorris Carnes, MD   Subjective:    No complaints, no dyspnea or chest pain.  No further hemoptysis.   Coronary CT 12/23/18 with Calcium score of 0 and no significant coronary disease.   Cardiac MRI:  1.  Moderate pericardial effusion without tamponade. 2. Mild-moderately dilated LV with mild LV hypertrophy. EF 16%, diffuse hypokinesis. Possible small LV thrombus. 3.  Mild RV dilation with EF 21%. 4.  Areas of non-coronary pattern LGE noted, ?myocarditis.  Objective:   Weight Range: 95.6 kg Body mass index is 36.75 kg/m.   Vital Signs:   Temp:  [97.4 F (36.3 C)-99 F (37.2 C)] 98.5 F (36.9 C) (03/16 0310) Pulse Rate:  [71-75] 73 (03/16 0310) Resp:  [14-30] 27 (03/16 0200) BP: (109-120)/(62-76) 119/76 (03/16 0310) SpO2:  [100 %] 100 % (03/16 0310) Last BM Date: 12/23/18  Weight change: Filed Weights   12/21/18 0800 12/22/18 0300 12/23/18 0600  Weight: 99.1 kg 95.3 kg 95.6 kg    Intake/Output:   Intake/Output Summary (Last 24 hours) at 12/27/2018 0925 Last data filed at 12/26/2018 1058 Gross per 24 hour  Intake 240 ml  Output -  Net 240 ml    Physical Exam    General: NAD Neck: No JVD, no thyromegaly or thyroid nodule.  Lungs: Clear to auscultation bilaterally with normal respiratory effort. CV: Nondisplaced PMI.  Heart regular S1/S2, no S3/S4, no murmur.  No peripheral edema.  No carotid bruit.  Normal pedal pulses.  Abdomen: Soft, nontender, no hepatosplenomegaly, no distention.  Skin: Intact without lesions or rashes.  Neurologic: Alert and oriented x 3.  Psych: Normal affect. Extremities: No clubbing or cyanosis.  HEENT: Normal.   Telemetry   NSR 80s, personally reviewed.   EKG    No new tracings.    Labs    CBC Recent Labs    12/26/18 1055 12/27/18 0217  WBC 6.5 7.1  HGB 9.3* 9.3*  HCT 31.8* 32.0*   MCV 76.6* 76.9*  PLT 385 244   Basic Metabolic Panel Recent Labs    12/26/18 1055 12/27/18 0217  NA 136 137  K 4.1 4.4  CL 106 110  CO2 23 22  GLUCOSE 131* 100*  BUN 10 11  CREATININE 1.04* 0.95  CALCIUM 8.5* 8.7*  MG 1.9  --    Liver Function Tests Recent Labs    12/26/18 1055 12/27/18 0217  AST 21 21  ALT 22 23  ALKPHOS 40 44  BILITOT 0.7 0.8  PROT 5.9* 5.9*  ALBUMIN 2.1* 2.2*   No results for input(s): LIPASE, AMYLASE in the last 72 hours. Cardiac Enzymes No results for input(s): CKTOTAL, CKMB, CKMBINDEX, TROPONINI in the last 72 hours.  BNP: BNP (last 3 results) Recent Labs    12/17/18 2014 12/19/18 0943  BNP 836.8* 192.4*   ProBNP (last 3 results) Recent Labs    12/16/18 1150  PROBNP 3,613*   D-Dimer No results for input(s): DDIMER in the last 72 hours. Hemoglobin A1C No results for input(s): HGBA1C in the last 72 hours. Fasting Lipid Panel No results for input(s): CHOL, HDL, LDLCALC, TRIG, CHOLHDL, LDLDIRECT in the last 72 hours. Thyroid Function Tests No results for input(s): TSH, T4TOTAL, T3FREE, THYROIDAB in the last 72 hours.  Invalid input(s): FREET3  Other results:  Imaging   No results found.  Medications:    Scheduled  Medications: . amiodarone  400 mg Oral BID   Followed by  . [START ON 12/30/2018] amiodarone  200 mg Oral BID   Followed by  . [START ON 01/07/2019] amiodarone  200 mg Oral Daily  . apixaban  10 mg Oral BID   Followed by  . [START ON 01/01/2019] apixaban  5 mg Oral BID  . carvedilol  6.25 mg Oral BID WC  . Chlorhexidine Gluconate Cloth  6 each Topical Daily  . furosemide  40 mg Oral Daily  . isosorbide-hydrALAZINE  1 tablet Oral TID  . sacubitril-valsartan  1 tablet Oral BID  . sodium chloride flush  10-40 mL Intracatheter Q12H  . spironolactone  25 mg Oral QHS    Infusions:   PRN Medications: acetaminophen, albuterol, alteplase, guaiFENesin-codeine, hydrALAZINE, morphine injection, ondansetron **OR**  ondansetron (ZOFRAN) IV, oxyCODONE, sodium chloride flush, traMADol  Patient Profile   [name] [name] is a 33 y.o. female with h/o pregnancy induced HTN and suspected diastolic CHF. Admitted with worsening SOB and hemoptysis. Found to have acute PE +/- PNA with fever.   CHF team asked to see with marked volume overload and new systolic CHF.   Assessment/Plan   1. Acute on chronic systolic CHF: She has a nonischemic cardiomyopathy, coronary CTA showed no CAD. EF was reportedly low on echo at hospital in [facility] in 12/19. This admission, echo with EF 20-25% with moderate RV systolic dysfunction. As above, no recent pregnancy so doubt peri-partum CMP. No family history of CHF and coronary CTA showed no CAD.  Cardiac MRI showed LV EF 16% with possible small LV thrombus, RV EF 21%.  Delayed enhancement imaging showed non-coronary pattern LGE that could be consistent with myocarditis. Co-ox 58% this morning with SBP 110s-120s generally.  - Can continue Lasix 40 mg daily.  - Continue Bidil 1 tab tid.   -Continue spironolactone25 mg daily.  -Continue Coreg 6.25 mg bid. -Continue Entresto 49/51 bid.  - She will need close followup to assess her trajectory.  It is possible she could need advanced therapies down the road.  2. Pulmonary embolus: On right. She says that she has not been moving much at all for weeks due to exertional dyspnea. She has no family history of VTE.  - Transitioned to Eliquis.   3. Atrial fibrillation: Paroxysmal, currently in NSR. No prior history. Likely triggered by CHF and PE.  - Transitioned to Eliquis off heparin. - Now on po amiodarone.  Atrial fibrillation likely triggered by PE/active CHF/PNA.  Can stop amiodarone after 1 month and watch for recurrence.  4. PNA: Chest CT suggestive of PNA, not pulmonary infarction.Now afebrile.  - Abx course completed.   5. Moderate pericardial effusion: No tamponade. 6. Possible small LV thrombus: Will be  anticoagulated.  7. Disposition: Home today. She will need very close followup in CHF clinic. Cardiac meds for discharge: spironolactone 25 daily, Lasix 40 daily, Bidil 1 tab tid, Entresto 49/51 bid, Coreg 6.25 bid, apixaban, amiodarone 200 mg bid x 1 week then 200 mg daily.   Loralie Champagne 12/27/2018 9:25 AM

## 2018-12-27 NOTE — Progress Notes (Signed)
MD paged regarding Pt's Albumin level, response pending. Pt remains stable at this time.

## 2018-12-27 NOTE — TOC Transition Note (Signed)
Transition of Care [facility]) - CM/SW Discharge Note   Patient Details  Name: [name] [name] MRN: [id number] Date of Birth: Jan 16, 1986  [facility] CM/SW Contact:  Zenon Mayo, RN Phone Number: 12/27/2018, 12:22 PM   Clinical Narrative:    Patient is for discharge today home with Home Health with [facility] and [place] resuming with [facility], Tiffany notified at [place],  Patient states she does not remember getting the Entresto and eliquis co pay cards, this NCM gave her the copay cards.  Patient also stated she does not want a walker.     Final next level of care: [place] Barriers to Discharge: Continued Medical Work up   Patient Goals and CMS Choice Patient states their goals for this hospitalization and ongoing recovery are:: "To get better and get home to my son" CMS Medicare.gov Compare Post Acute Care list provided to:: Patient Choice offered to / list presented to : Patient  Discharge Placement                       Discharge Plan and Services Discharge Planning Services: CM Consult Post Acute Care Choice: Home Health          DME Arranged: N/A DME Agency: NA HH Arranged: RN, Disease Management [place] Agency: [facility])   Social Determinants of Health (SDOH) Interventions     Readmission Risk Interventions Readmission Risk Prevention Plan 12/22/2018  Transportation Screening Complete  PCP or Specialist Appt within 5-7 Days (No Data)  Home Care Screening Complete  Medication Review (RN CM) Complete  Some recent data might be hidden

## 2018-12-28 ENCOUNTER — Telehealth (HOSPITAL_COMMUNITY): Payer: Self-pay

## 2018-12-28 NOTE — Telephone Encounter (Signed)
Called patient to see if she is interested in the Cardiac Rehab Program. Patient expressed interest. Explained scheduling process and went over insurance, patient verbalized understanding. Will contact patient for scheduling once f/u has been completed. 

## 2018-12-28 NOTE — Telephone Encounter (Signed)
Pt insurance is active and benefits verified through [facility]. Co-pay $0.00, DED $1,400.00/$1.080.38 met, out of pocket $4,000.00/$1,023.38 met, co-insurance 20%. No pre-authorization required. Renee/[facility], 12/28/2018 @ 12:15PM , REF# [id number]  Will contact patient to see if she is interested in the Cardiac Rehab Program. If interested, patient will need to complete follow up appt. Once completed, patient will be contacted for scheduling upon review by the RN Navigator.

## 2018-12-29 ENCOUNTER — Other Ambulatory Visit: Payer: Self-pay | Admitting: *Deleted

## 2018-12-29 DIAGNOSIS — Z3046 Encounter for surveillance of implantable subdermal contraceptive: Secondary | ICD-10-CM | POA: Diagnosis not present

## 2018-12-29 DIAGNOSIS — Z3009 Encounter for other general counseling and advice on contraception: Secondary | ICD-10-CM | POA: Diagnosis not present

## 2018-12-29 DIAGNOSIS — I2699 Other pulmonary embolism without acute cor pulmonale: Secondary | ICD-10-CM | POA: Diagnosis not present

## 2018-12-29 NOTE — Patient Outreach (Signed)
[facility]) Care Management  12/29/2018  [name] [name] 1986/09/02 [id number]   Transition of care telephone call  Referral received: 12/29/2018 Initial outreach: 12/29/2018 Admitted: Fever and dyspnea (PE and new onset CHF) Insurance: [place]     Initial unsuccessful telephone call to patient's preferred number in order to complete transition of care assessment; no answer, left HIPAA compliant voicemail message requesting return call.   Objective: Per the electronic medical record, [name] [name]  was hospitalized at [facility] from 12/17/2018-12/27/2018 with Community acquired pneumonia of the right middle lobe of the lung. Comorbidities include: Pulmonary embolism with unspecified chronicity, SOB and acute systolic CHF.  She was discharged to home on 12/27/2018 with the need for home health services per the discharge summary.  Plan: If no return call from patient by the end of business day today, this RNCM will route unsuccessful outreach letter with [place] Management pamphlet and 24 hour Nurse Advice Line [place] to [place] Management clinical pool to be mailed to patient's home address. This RNCM will attempt another outreach within 4 business days.  Raina Mina, RN Care Management Coordinator [place] Office [phone]

## 2018-12-30 ENCOUNTER — Telehealth: Payer: Self-pay | Admitting: Internal Medicine

## 2018-12-30 NOTE — Telephone Encounter (Signed)
PT was suppose to start being seen by [facility] (12/30/18), but an issue came up, and the service will not be able to start seeing her until tomorrow (12/31/18)

## 2018-12-31 DIAGNOSIS — I5023 Acute on chronic systolic (congestive) heart failure: Secondary | ICD-10-CM | POA: Diagnosis not present

## 2018-12-31 DIAGNOSIS — I429 Cardiomyopathy, unspecified: Secondary | ICD-10-CM | POA: Diagnosis not present

## 2018-12-31 DIAGNOSIS — I480 Paroxysmal atrial fibrillation: Secondary | ICD-10-CM | POA: Diagnosis not present

## 2018-12-31 DIAGNOSIS — I471 Supraventricular tachycardia: Secondary | ICD-10-CM | POA: Diagnosis not present

## 2018-12-31 DIAGNOSIS — I10 Essential (primary) hypertension: Secondary | ICD-10-CM | POA: Diagnosis not present

## 2018-12-31 DIAGNOSIS — I110 Hypertensive heart disease with heart failure: Secondary | ICD-10-CM | POA: Diagnosis not present

## 2018-12-31 DIAGNOSIS — Z719 Counseling, unspecified: Secondary | ICD-10-CM | POA: Diagnosis not present

## 2018-12-31 DIAGNOSIS — I313 Pericardial effusion (noninflammatory): Secondary | ICD-10-CM | POA: Diagnosis not present

## 2018-12-31 DIAGNOSIS — I4891 Unspecified atrial fibrillation: Secondary | ICD-10-CM | POA: Diagnosis not present

## 2018-12-31 DIAGNOSIS — I509 Heart failure, unspecified: Secondary | ICD-10-CM | POA: Diagnosis not present

## 2018-12-31 DIAGNOSIS — I5032 Chronic diastolic (congestive) heart failure: Secondary | ICD-10-CM | POA: Diagnosis not present

## 2018-12-31 DIAGNOSIS — I2699 Other pulmonary embolism without acute cor pulmonale: Secondary | ICD-10-CM | POA: Diagnosis not present

## 2018-12-31 DIAGNOSIS — J189 Pneumonia, unspecified organism: Secondary | ICD-10-CM | POA: Diagnosis not present

## 2019-01-03 ENCOUNTER — Other Ambulatory Visit: Payer: Self-pay

## 2019-01-03 ENCOUNTER — Other Ambulatory Visit: Payer: Self-pay | Admitting: *Deleted

## 2019-01-03 NOTE — Patient Outreach (Signed)
[facility]) Care Management  01/03/2019  [name] [name] [birth year] [id number]  Transition of care call   Referral received: 12/29/2018 Initial outreach:  12/29/2018 Insurance:     Subjective: Initial successful telephone call to patient's preferred number in order to complete transition of care assessment; 2 HIPAA identifiers verified. Explained purpose of call and completed transition of care assessment.  States she is doing well, denies post op problems, states she is taking her prescribed medications, tolerating  diet , denies bowel or bladder problems.  Family are assisting with his/her recovery.      Objective:  [name] [name] was hospitalized at [facility] from 12/17/2018-12/27/2018 for Comorbidities include: Pulmonary embolism with unspecified chronicity, SOB and acute systolic CHF. She was discharged to home on 12/27/18 with the need for home health services.   Assessment:  Patient voices good understanding of all discharge instructions.  See transition of care flowsheet for assessment details.   Plan:  Reviewed [name] [name] Active Health Management 2020 Wellness Requirements of: Completing the computerized Health Assessment and the Health Action Step with Active Health Management [facility]) by June 14 2019 AND have an annual physical between October 13, 2017 and April 13, 2019.  No ongoing care management needs identified so will close case to [place] Management care management services and route successful outreach letter with [place] Management pamphlet and 24 Hour Nurse Line Magnet to [place] Management clinical pool to be mailed to patient's home address.   Patient was recently discharged from hospital and all medications have been reviewed.  [name] Mina, RN Care Management Coordinator [place] Office [phone]

## 2019-01-04 ENCOUNTER — Encounter (HOSPITAL_COMMUNITY): Payer: Self-pay

## 2019-01-04 ENCOUNTER — Telehealth: Payer: Self-pay | Admitting: Internal Medicine

## 2019-01-04 NOTE — Telephone Encounter (Signed)
New Message   [facility] nurse with [facility] is calling to obtain orders.  Frequency orders for CHF education. 1 week one, 2 week one and 1 PRN. Please advise.

## 2019-01-04 NOTE — Telephone Encounter (Signed)
Will forward to [facility] triage pool.  Pt is scheduled to be seen tomorrow at [facility].

## 2019-01-05 ENCOUNTER — Ambulatory Visit (HOSPITAL_COMMUNITY)
Admission: RE | Admit: 2019-01-05 | Discharge: 2019-01-05 | Disposition: A | Discharge status: Home / Self Care | Payer: 59 | Source: Ambulatory Visit | Attending: Cardiology | Admitting: Cardiology

## 2019-01-05 ENCOUNTER — Other Ambulatory Visit: Payer: Self-pay

## 2019-01-05 ENCOUNTER — Ambulatory Visit: Payer: 59 | Admitting: Registered"

## 2019-01-05 DIAGNOSIS — I513 Intracardiac thrombosis, not elsewhere classified: Secondary | ICD-10-CM | POA: Diagnosis not present

## 2019-01-05 DIAGNOSIS — I240 Acute coronary thrombosis not resulting in myocardial infarction: Secondary | ICD-10-CM

## 2019-01-05 DIAGNOSIS — I5022 Chronic systolic (congestive) heart failure: Secondary | ICD-10-CM | POA: Diagnosis not present

## 2019-01-05 DIAGNOSIS — I2699 Other pulmonary embolism without acute cor pulmonale: Secondary | ICD-10-CM

## 2019-01-05 DIAGNOSIS — I480 Paroxysmal atrial fibrillation: Secondary | ICD-10-CM

## 2019-01-05 DIAGNOSIS — R0683 Snoring: Secondary | ICD-10-CM | POA: Diagnosis not present

## 2019-01-05 MED ORDER — CARVEDILOL 12.5 MG PO TABS: 12.5000 mg | ORAL_TABLET | Freq: Two times a day (BID) | ORAL | 3 refills | Status: DC

## 2019-01-05 NOTE — Progress Notes (Signed)
Heart Failure TeleHealth Note  Due to national recommendations of social distancing due to [place] 19, telehealth visit is felt to be most appropriate for this patient at this time.  I discussed the limitations, risks, security and privacy concerns of performing an evaluation and management service by telephone and the availability of in person appointments. I also discussed with the patient that there may be a patient responsible charge related to this service. The patient expressed understanding and agreed to proceed. Please see consent in mychart.   ID:  [name] [name], DOB Feb 05, 1986, MRN [id number]  Location: Home  Provider location: [address] [place] Type of Visit: Established patient  PCP:  Fanny Bien, MD  Cardiologist:  Dorris Carnes, MD Primary HF: Dr Aundra Dubin  Chief Complaint: Chronic systolic HF, PAF, PE, LV thrombus, HTN   History of Present Illness: [name] [name] is a 33 y.o. female with a history of pregnancy induced HTN, new systolic HF with EF 65% on cMRI (12/2018), acute PE 12/2018 on Eliquis, possible LV thrombus, and PAF on eliquis and amio,   Admitted 12/17/18 with 30 lb weight gain. CTA showed RLL PE with no obvious right heart strain. She was started on heparin and transitioned to Eliquis prior to DC. Echo showed EF 20-25% with moderately reduced RV. HF team consulted. She was diuresed with lasix drip and metolazone. Coronary CT showed no CAD. HF medications were optimized. cMRI showed EF 16% with possible LV thrombus, RV EF 21%, and LGE pattern that could be consistent with myocarditis. Coox was monitored and remained normal. She had an episode of atrial fibrillation that converted with IV amio. She was transitioned to oral amio with plans to stop after 1 month and watch for recurrence. She was also treated for PNA by primary team. DC weight: 210 lbs.   [name] [name] is a 33 y.o. female who presents via video conferencing for a telehealth visit today.  Overall doing well. Sometimes feels "hot" after taking medications. Does not occur every day. No dizziness. No SOB. She has been walking throughout the house, no problems with stairs. No edema, orthopnea, or PND. No bleeding on Eliquis. She has not had any palpitations - she could tell when she was in AF previously. No bleeding on Eliquis. No cough, fevers, chills, or sweats. No sick contacts in the last 2 weeks. She has been at home only. She had nexplanon taken out with concern for blood clots. No current birth control, but plans to use condoms and is considering non-hormonal options. Her fiance has been getting her food. 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 She was able to get all medications through [facility] and does not need refills currently. Copays were $5 max. Weights at home 196-197 lbs since discharge. Limits fluid and salt intake. She has [place] [facility], sports through [facility]. They are monitoring her BP and HR; her SBP was 108 on Friday, but she is unsure of HR.    She denies symptoms of cough, fevers, chills, or new SOB worrisome for COVID 19.   Past Medical History:  Diagnosis Date  . Condyloma acuminata   . HTN in pregnancy, chronic 09/24/2015   [place] labs: [x ]Cr, [x]  24hr protein (208) P:Cr 92 Plat: 207 AST/ALT: 10/10    . Hypertension   . Obesity in pregnancy    Antepartum, first trimester  . URI (upper respiratory infection)    Past Surgical History:  Procedure Laterality Date  . BIRTH  CONTROL IMPLANT Left 03/2016   "Nexplanon in my upper arm"  . CESAREAN SECTION N/A 02/25/2016   Procedure: CESAREAN SECTION;  Surgeon: Truett Mainland, DO;  Location: [facility];  Service: Obstetrics;  Laterality: N/A;     Current Outpatient Medications  Medication Sig Dispense Refill  . acetaminophen (TYLENOL) 325 MG tablet Take 2 tablets (650 mg total) by mouth every 6 (six) hours as needed for mild pain, fever or headache.    Marland Kitchen amiodarone (PACERONE) 200 MG tablet Take 2  tablets (400 mg total) by mouth 2 (two) times daily. For 7 days, then 200 mg (1 tab) twice daily for 7 day then 200 mg (1 tab) daily. 60 tablet 3  . carvedilol (COREG) 6.25 MG tablet Take 1 tablet (6.25 mg total) by mouth 2 (two) times daily with a meal. 60 tablet 3  . Eliquis DVT/PE Starter Pack (ELIQUIS STARTER PACK) 5 MG TABS Take as directed on package: start with two-5mg  tablets twice daily for 7 days. On day 8, switch to one-5mg  tablet twice daily. 1 each 0  . ferrous sulfate 325 (65 FE) MG tablet Take 1 tablet (325 mg total) by mouth daily with breakfast. 30 tablet 0  . furosemide (LASIX) 40 MG tablet Take 1 tablet (40 mg total) by mouth daily. 90 tablet 3  . isosorbide-hydrALAZINE (BIDIL) 20-37.5 MG tablet Take 0.5-1 tablets by mouth 3 (three) times daily. Start with 0.5 mg three times a day until instructed to increase by heart failure MD 90 tablet 3  . sacubitril-valsartan (ENTRESTO) 49-51 MG Take 1 tablet by mouth 2 (two) times daily. 60 tablet 6  . spironolactone (ALDACTONE) 25 MG tablet Take 1 tablet (25 mg total) by mouth at bedtime. 30 tablet 3   No current facility-administered medications for this encounter.     Allergies:   Patient has no known allergies.   Social History:  The patient  reports that she has never smoked. She has never used smokeless tobacco. She reports that she does not drink alcohol or use drugs.   Family History:  The patient's family history includes Heart disease in her mother; Hypertension in her mother.   ROS:  Please see the history of present illness.   All other systems are personally reviewed and negative.   Exam:  (Tele Health Call with video conferencing) General: Well appearing. No resp difficulty. HEENT: Normal Neck: Supple. JVP does not appear elevated Extremities: R and LLE no edema.  Lungs: Normal respiratory effort with conversation.  Neuro: Alert & oriented x 3.   Recent Labs: 12/16/2018: NT-Pro BNP 3,613 12/18/2018: TSH 1.236  12/19/2018: B Natriuretic Peptide 192.4 12/26/2018: Magnesium 1.9 12/27/2018: ALT 23; BUN 11; Creatinine, Ser 0.95; Hemoglobin 9.3; Platelets 386; Potassium 4.4; Sodium 137  Personally reviewed   Wt Readings from Last 3 Encounters:  12/23/18 95.6 kg (210 lb 12.2 oz)  12/16/18 107.4 kg (236 lb 12.8 oz)  08/29/18 106.1 kg (233 lb 14.5 oz)     ASSESSMENT AND PLAN:  1. Chronic systolic CHF: She has a nonischemic cardiomyopathy, coronary CTA showed no CAD. EF was reportedly low on echo at hospital in [facility] in 12/19. Echo 12/18/18: EF 20-25% with moderate RV systolic dysfunction. As above, no recent pregnancy so doubt peri-partum CMP. No family history of CHF and coronary CTA showed no CAD.  Cardiac MRI showed LV EF 16% with possible small LV thrombus, RV EF 21%.  Delayed enhancement imaging showed non-coronary pattern LGE that could be consistent with  myocarditis.  - NYHA I-II. Volume sounds and looks stable.   - Continue Lasix 40 mg daily. Check BMET through [facility] -Continue Bidil 1 tab tid.   -Continue spironolactone25 mg daily.  -Increase coreg to 12.5 mg BID. Attempted to have her check pulse today, but was unsuccessful. Will have [facility] help [place] monitor HR and BP.  -Continue Entresto 49/51 bid.  - Will repeat echo in 3 months to reassess EF. Discussed that she may need EP referral for ICD +/- possibly advanced therapies if EF does not improve. - Discussed the importance of birth control while on HF medications. She follows at [facility] and is considering non-hormonal options. Advised her to use condoms for now.   - Discussed importance of daily weights, limiting fluid and salt intake, and taking all medications.   2. Pulmonary embolus: On right. After decreased activity. She has no family history of VTE.  - Continue Eliquis 5 mg BID. Check CBC through [facility].   3. Atrial fibrillation: Paroxysmal.Likely triggered by CHF and PE. Was in NSR prior to DC. No hx.  - Continue  Eliquis 5 mg BID - Continue amio 200 mg daily. Atrial fibrillation likely triggered by PE/active CHF/PNA.  Can stop amiodarone after 1 month and watch for recurrence. - Will stop in 2 weeks and set up for Ziopatch at that time. May be able to avoid Ziopatch since she can tell when she is out of rhythm. Will discuss with Dr Aundra Dubin.   - Denies palpitations. She could tell when she was out of rhythm before  4. Possible small LV thrombus: Continue Eliquis.   5. Suspected OSA - Refer for sleep study.   COVID screen The patient does not have any symptoms that suggest any further testing/ screening at this time.  Social distancing reinforced today.  Relevant cardiac medications were reviewed at length with the patient today.  The patient does not have concerns regarding their medications at this time.   Recommended follow-up: 2 weeks via webex tele-health conference.   The following changes were made today: Increase coreg to 12.5 mg BID.  Labs/ tests ordered today include: BMET and CBC through [facility] this week. Will ask them to also let us know her BP and HR. Refer for sleep study. Set up echo in 3 months with Dr Aundra Dubin  Patient Risk: After full review of this patients clinical status, I feel that they are at moderate risk for cardiac decompensation at this time.  Today, I have spent 25 minutes with the patient with telehealth technology discussing medication changes, heart failure, purpose of heart failure clinic, and the above.    Signed, Georgiana Shore, NP  01/05/2019 10:59 AM  [facility] [place] [address] [phone] (office) [phone] (fax)

## 2019-01-05 NOTE — Addendum Note (Signed)
Encounter addended by: Jovita Kussmaul, RN on: 01/05/2019 12:56 PM  Actions taken: Pharmacy for encounter modified, Order list changed, Diagnosis association updated, Clinical Note Signed

## 2019-01-05 NOTE — Addendum Note (Signed)
Encounter addended by: Jovita Kussmaul, RN on: 01/05/2019 1:16 PM  Actions taken: Order list changed, Diagnosis association updated

## 2019-01-05 NOTE — Patient Instructions (Addendum)
       Home Health will do lab work and report vitals to [place] the next time they visit.  INCREASE Coreg to 12.5mg  TWICE A DAY, this prescription has been sent to your pharmacy.   You have been referred to complete a Sleep Study, this office will call and schedule this appointment with you.  We have scheduled a 2 week phone follow up with [facility] April 8th @ 12:00pm  Your physician has requested that you have an echocardiogram. Echocardiography is a painless test that uses sound waves to create images of your heart. It provides your doctor with information about the size and shape of your heart and how well your heart's chambers and valves are working. This procedure takes approximately one hour. There are no restrictions for this procedure.  Your physician recommends that you schedule a follow-up appointment in: 3 months, the ECHO will be completed the same day.

## 2019-01-06 ENCOUNTER — Encounter: Payer: 59 | Attending: Cardiology | Admitting: Registered"

## 2019-01-06 ENCOUNTER — Other Ambulatory Visit: Payer: Self-pay

## 2019-01-06 ENCOUNTER — Encounter: Payer: Self-pay | Admitting: Registered"

## 2019-01-06 DIAGNOSIS — I5032 Chronic diastolic (congestive) heart failure: Secondary | ICD-10-CM | POA: Diagnosis not present

## 2019-01-06 NOTE — Patient Instructions (Signed)
-    Aim to have 3 meals a day.  - Continue to read food labels aiming for less than 300 mg of sodium per serving.   - Try almond butter in place of peanut butter.   - Check nutrition facts online ahead of time when eating out at restaurant.   - Meal plan for upcoming days.   - Continue to seasoning with a variety of herbs and spices. Try onion/onion powder, garlic/garlic powder, and olive oil when cooking vegetables.

## 2019-01-06 NOTE — Progress Notes (Signed)
Medical Nutrition Therapy:  Appt start time: 9:43 end time:  11:00.   Assessment:  Primary concerns today: Pt states she was recently diagnosed with heart failure.    Pt expectations: to eat more healthier, learn how to eat better  Pt states she has been incorporating some changes since recent hospital stay this month. Pt states she she was experiencing some swelling in stomach and feet. Pt states she has been looking at nutritional content on foods, eating out less often, and no longer drinking Dr. Malachi Bonds. Pt states prior to this she used to eat out a lot and not really a cooker. Pt states she loves a "greasy burger" and would order the biggest burger at a variety of fast food places. Pt states she used to eat out at least twice/day.   Pt states she is currently following a low sodium diet (no more than 2000 mg/day) and drinking 64 fluid ounces a day. Pt states she has been cooking more often, averaging about 3 times a week. Pt states she has been avoiding burgers although she loves them.    Pt states she has a 51 year old son and needs to do this for him. Pt states husband is truck driver and will grab take out food but she has not been eating it. Pt states hardest thing will be to make lifestyle changes because she is used to eating convenience foods quickly because she has other things to do.    Preferred Learning Style:   No preference indicated   Learning Readiness:   Ready  Change in progress   MEDICATIONS: See list   DIETARY INTAKE:  Usual eating pattern includes 2-3 meals and 1-2 snacks per day.  Everyday foods include sandwich, salad, chicken, potatoes, chips.  Avoided foods include pasta and fast food burgers.    24-hr recall:  B ( AM): yogurt + almonds  Snk ( AM): none  L ( PM): sometimes skips; sandwich or salad Snk ( PM): sometimes fruit D ( PM): baked chicken (Mrs. Deliah Boston, onion, garlic, pepper) + baked potato + side salad Snk ( PM): small bag of chips Beverages:  orange juice, ginger ale/sprite, water  Usual physical activity: walking at home  Estimated energy needs: 1800 calories 200 g carbohydrates 135 g protein 50 g fat  Progress Towards Goal(s):  In progress.   Nutritional Diagnosis:  NB-1.1 Food and nutrition-related knowledge deficit As related to heart failure.  As evidenced by pt verbalizes incomplete knowledge.    Intervention:  Nutrition education and counseling. Pt was educated and counseled on nutrition therapy related to heart failure. Pt was educated on what to look for when reading labels for sodium content, how to meal plan, how to prepare/season meals at home without salt, appropriate lower sodium options, and importance of eating throughout the day. Pt was also encouraged to keep up the great work with changes made, be patient with herself, and reminded that change takes time. Pt was in agreement with goals listed.  Goals: -  Aim to have 3 meals a day. - Continue to read food labels aiming for less than 300 mg of sodium per serving.  - Try almond butter in place of peanut butter.  - Check nutrition facts online ahead of time when eating out at restaurant.  - Meal plan for upcoming days.  - Continue to seasoning with a variety of herbs and spices. Try onion/onion powder, garlic/garlic powder, and olive oil when cooking vegetables.   Teaching Method Utilized:  Visual Auditory Hands on  Handouts given during visit include:  Heart Failure Nutrition Therapy  Barriers to learning/adherence to lifestyle change: preparation stage of change  Demonstrated degree of understanding via:  Teach Back   Monitoring/Evaluation:  Dietary intake, exercise, and body weight prn.

## 2019-01-07 ENCOUNTER — Telehealth (HOSPITAL_COMMUNITY): Payer: Self-pay

## 2019-01-07 NOTE — Telephone Encounter (Signed)
Called and spoke with pt in regards to CR, adv we have recv'd the pt referral. And at this time we are not scheduling due to the COVID-19. Once we have resume scheduling we will contact the pt. She/He verbalized understanding. °Gloria W. Support Rep II °

## 2019-01-11 ENCOUNTER — Telehealth: Payer: Self-pay | Admitting: Neurology

## 2019-01-11 NOTE — Telephone Encounter (Signed)
Due to current COVID 19 pandemic, our office is severely reducing in office visits for at least the next 2 weeks, in order to minimize the risk to our patients and healthcare providers. Our staff will contact you for next steps.  Pt understands that although there may be some limitations with this type of visit, we will take all precautions to reduce any security or privacy concerns.  Pt understands that this will be treated like an in office visit and we will file with pt's insurance, and there may be a patient responsible charge related to this service. Pt confirmed current e-mail address is [email or web address]

## 2019-01-12 DIAGNOSIS — I480 Paroxysmal atrial fibrillation: Secondary | ICD-10-CM | POA: Diagnosis not present

## 2019-01-12 DIAGNOSIS — I313 Pericardial effusion (noninflammatory): Secondary | ICD-10-CM | POA: Diagnosis not present

## 2019-01-12 DIAGNOSIS — I2699 Other pulmonary embolism without acute cor pulmonale: Secondary | ICD-10-CM | POA: Diagnosis not present

## 2019-01-12 DIAGNOSIS — I5032 Chronic diastolic (congestive) heart failure: Secondary | ICD-10-CM | POA: Diagnosis not present

## 2019-01-12 DIAGNOSIS — I429 Cardiomyopathy, unspecified: Secondary | ICD-10-CM | POA: Diagnosis not present

## 2019-01-12 DIAGNOSIS — I5023 Acute on chronic systolic (congestive) heart failure: Secondary | ICD-10-CM | POA: Diagnosis not present

## 2019-01-12 DIAGNOSIS — I471 Supraventricular tachycardia: Secondary | ICD-10-CM | POA: Diagnosis not present

## 2019-01-12 DIAGNOSIS — I110 Hypertensive heart disease with heart failure: Secondary | ICD-10-CM | POA: Diagnosis not present

## 2019-01-17 NOTE — Telephone Encounter (Signed)
I called pt to discuss her appt. No answer, left a message asking her to call me back.

## 2019-01-18 NOTE — Progress Notes (Signed)
Heart Failure TeleHealth Note  Due to national recommendations of social distancing due to [place] 19, telehealth visit is felt to be most appropriate for this patient at this time.  I discussed the limitations, risks, security and privacy concerns of performing an evaluation and management service by telephone and the availability of in person appointments. I also discussed with the patient that there may be a patient responsible charge related to this service. The patient expressed understanding and agreed to proceed. Please see consent in mychart.   ID:  [name] [name], DOB [birth year], MRN [id number]  Location: Home  Provider location: [address], [place] Type of Visit: Established patient  PCP:  Fanny Bien, MD  Cardiologist:  Dorris Carnes, MD Primary HF: Dr Aundra Dubin  Chief Complaint: Chronic systolic HF   History of Present Illness: [name] [name] is a 33 y.o. female with a history of pregnancy induced HTN, new systolic HF with EF 18% on cMRI (12/2018), acute PE 12/2018 on Eliquis, possible LV thrombus, and PAF on eliquis and amio,   Admitted 12/17/18 with 30 lb weight gain. CTA showed RLL PE with no obvious right heart strain. She was started on heparin and transitioned to Eliquis prior to DC. Echo showed EF 20-25% with moderately reduced RV. HF team consulted. She was diuresed with lasix drip and metolazone. Coronary CT showed no CAD. HF medications were optimized. cMRI showed EF 16% with possible LV thrombus, RV EF 21%, and LGE pattern that could be consistent with myocarditis. Coox was monitored and remained normal. She had an episode of atrial fibrillation that converted with IV amio. She was transitioned to oral amio with plans to stop after 1 month and watch for recurrence. She was also treated for PNA by primary team. DC weight: 210 lbs.   She presents via Administrator for a telehealth visit today. 2 weeks ago coreg was increased. Requested labwork from [facility], but  has not yet been done per patient. Overall doing well. She feels like she is back to herself. Denies SOB, edema, orthopnea, or PND. No palpitations. No bleeding on Eliquis. Walking outside for 30 minutes daily. Appetite and energy level great. Occasionally gets lightheaded after taking am medications, but isn't too bad. She is followed by [facility] RN. Says they didn't have an order for bloodwork. Checking BP and HR with HH, but not sure of exact numbers. Continues to do social distancing. Limits fluid and salt intake. Weight trending up, but she has been eating much more. Taking all medications.   Weight: 201 lbs  She denies symptoms of cough, fevers, chills, or new SOB worrisome for COVID 19.   Past Medical History:  Diagnosis Date  . Condyloma acuminata   . HTN in pregnancy, chronic 09/24/2015   [place] labs: [x ]Cr, [x]  24hr protein (208) P:Cr 92 Plat: 207 AST/ALT: 10/10    . Hypertension   . Obesity in pregnancy    Antepartum, first trimester  . URI (upper respiratory infection)    Past Surgical History:  Procedure Laterality Date  . BIRTH CONTROL IMPLANT Left 03/2016   "Nexplanon in my upper arm"  . CESAREAN SECTION N/A 02/25/2016   Procedure: CESAREAN SECTION;  Surgeon: Truett Mainland, DO;  Location: [place];  Service: Obstetrics;  Laterality: N/A;     Current Outpatient Medications  Medication Sig Dispense Refill  . acetaminophen (TYLENOL) 325 MG tablet Take 2 tablets (650 mg total) by mouth every 6 (six) hours as needed  for mild pain, fever or headache.    Marland Kitchen amiodarone (PACERONE) 200 MG tablet Take 2 tablets (400 mg total) by mouth 2 (two) times daily. For 7 days, then 200 mg (1 tab) twice daily for 7 day then 200 mg (1 tab) daily. 60 tablet 3  . carvedilol (COREG) 12.5 MG tablet Take 1 tablet (12.5 mg total) by mouth 2 (two) times daily with a meal. 90 tablet 3  . Eliquis DVT/PE Starter Pack (ELIQUIS STARTER PACK) 5 MG TABS Take as directed on package: start with  two-5mg  tablets twice daily for 7 days. On day 8, switch to one-5mg  tablet twice daily. 1 each 0  . ferrous sulfate 325 (65 FE) MG tablet Take 1 tablet (325 mg total) by mouth daily with breakfast. 30 tablet 0  . furosemide (LASIX) 40 MG tablet Take 1 tablet (40 mg total) by mouth daily. 90 tablet 3  . isosorbide-hydrALAZINE (BIDIL) 20-37.5 MG tablet Take 0.5-1 tablets by mouth 3 (three) times daily. Start with 0.5 mg three times a day until instructed to increase by heart failure MD (Patient taking differently: Take 1 tablet by mouth 3 (three) times daily. Start with 0.5 mg three times a day until instructed to increase by heart failure MD) 90 tablet 3  . sacubitril-valsartan (ENTRESTO) 49-51 MG Take 1 tablet by mouth 2 (two) times daily. 60 tablet 6  . spironolactone (ALDACTONE) 25 MG tablet Take 1 tablet (25 mg total) by mouth at bedtime. 30 tablet 3   No current facility-administered medications for this visit.     Allergies:   Patient has no known allergies.   Social History:  The patient  reports that she has never smoked. She has never used smokeless tobacco. She reports that she does not drink alcohol or use drugs.   Family History:  The patient's family history includes Heart disease in her mother; Hypertension in her mother.   ROS:  Please see the history of present illness.   All other systems are personally reviewed and negative.   Exam:  (Tele Health Call with video conferencing) General: Well appearing. No resp difficulty. HEENT: Normal Extremities: R and LLE no edema.  Neuro: Alert & orientedx3  Recent Labs: 12/16/2018: NT-Pro BNP 3,613 12/18/2018: TSH 1.236 12/19/2018: B Natriuretic Peptide 192.4 12/26/2018: Magnesium 1.9 12/27/2018: ALT 23; BUN 11; Creatinine, Ser 0.95; Hemoglobin 9.3; Platelets 386; Potassium 4.4; Sodium 137  Personally reviewed   Wt Readings from Last 3 Encounters:  12/23/18 95.6 kg (210 lb 12.2 oz)  12/16/18 107.4 kg (236 lb 12.8 oz)  08/29/18 106.1 kg  (233 lb 14.5 oz)     ASSESSMENT AND PLAN:  1. Chronic systolic CHF: She has a nonischemic cardiomyopathy, coronary CTA showed no CAD. EF was reportedly low on echo at hospital in [facility] in 12/19. Echo 12/18/18: EF 20-25% with moderate RV systolic dysfunction. As above, no recent pregnancy so doubt peri-partum CMP. No family history of CHF and coronary CTA showed no CAD.  Cardiac MRI showed LV EF 16% with possible small LV thrombus, RV EF 21%.  Delayed enhancement imaging showed non-coronary pattern LGE that could be consistent with myocarditis.  - NYHA I-II. Volume sounds stable.  - Continue Lasix 40 mg daily. She can take an extra 20 mg PRN. Check BMET through [facility].  -Continue Bidil 1 tab tid.   -Continue spironolactone25 mg daily.  -Continue coreg 12.5 mg BID.  -Continue Entresto 49/51 bid. - She is sometimes lightheaded after taking all medications in the  morning, so will hold off on increasing today. She is unsure of her BP at home.  - Will repeat echo in 3 months to reassess EF. Discussed that she may need EP referral for ICD +/- possibly advanced therapies if EF does not improve. This has been scheduled for June. - Discussed the importance of birth control while on HF medications. She follows at [facility] and is considering non-hormonal options. Advised her to use condoms for now. Discussed again today.  - Discussed importance of daily weights, limiting fluid and salt intake, and taking all medications.   2. Pulmonary embolus: On right. After decreased activity. She has no family history of VTE.  - Continue Eliquis 5 mg BID. Check CBC through [facility].   3. Atrial fibrillation: Paroxysmal.Likely triggered by CHF and PE. Was in NSR prior to DC. No hx.  - Continue Eliquis 5 mg BID - Denies palpitations. She could tell when she was out of rhythm before - Atrial fibrillation likely triggered by PE/active CHF/PNA. Planning to stop after 1 month. Stop amiodarone today. She was  able to tell when she was in Afib previously, so will not get Ziopatch. Confirmed this with Dr Aundra Dubin.  - She will need to let us know if she has recurrent palpitations/afib.   4. Possible small LV thrombus: Continue Eliquis. Denies bleeding.  5. Suspected OSA - Referred for sleep study last visit. Delayed due to COVID, but they have contacted her.    COVID screen The patient does not have any symptoms that suggest any further testing/ screening at this time.  Social distancing reinforced today. Her fiance is getting groceries.  Patient Risk: After full review of this patients clinical status, I feel that they are at moderate risk for cardiac decompensation at this time.  Orders/changes:  Stop amiodarone BMET, CBC by [facility] RN Follow up 4-6 weeks with Dr Aundra Dubin  Signed, Georgiana Shore, NP  01/18/2019 2:31 PM   Today, I have spent 15 minutes with the patient with telehealth technology discussing medication changes, heart failure, purpose of heart failure clinic, and the above.    [facility] [place] [address] [place] [address] [phone] (office) [phone] (fax)

## 2019-01-18 NOTE — Telephone Encounter (Signed)
I called pt again to discuss her appt. No answer, left a message asking her to call me back.

## 2019-01-19 ENCOUNTER — Ambulatory Visit (HOSPITAL_COMMUNITY)
Admission: RE | Admit: 2019-01-19 | Discharge: 2019-01-19 | Disposition: A | Discharge status: Home / Self Care | Payer: 59 | Source: Ambulatory Visit | Attending: Internal Medicine | Admitting: Internal Medicine

## 2019-01-19 ENCOUNTER — Other Ambulatory Visit: Payer: Self-pay

## 2019-01-19 ENCOUNTER — Telehealth (HOSPITAL_COMMUNITY): Payer: Self-pay | Admitting: Vascular Surgery

## 2019-01-19 DIAGNOSIS — I5022 Chronic systolic (congestive) heart failure: Secondary | ICD-10-CM | POA: Diagnosis not present

## 2019-01-19 DIAGNOSIS — I2699 Other pulmonary embolism without acute cor pulmonale: Secondary | ICD-10-CM

## 2019-01-19 DIAGNOSIS — I480 Paroxysmal atrial fibrillation: Secondary | ICD-10-CM

## 2019-01-19 DIAGNOSIS — R0683 Snoring: Secondary | ICD-10-CM

## 2019-01-19 NOTE — Addendum Note (Signed)
Encounter addended by: Georgiana Shore, NP on: 01/19/2019 1:26 PM  Actions taken: Charge Capture section accepted

## 2019-01-19 NOTE — Addendum Note (Signed)
Encounter addended by: Valeda Malm, RN on: 01/19/2019 1:04 PM  Actions taken: Order list changed, Medication long-term status modified, Clinical Note Signed

## 2019-01-19 NOTE — Telephone Encounter (Signed)
Left pt message giving 4-6 week Doximity / telehealth visit w/ Caryl Pina, asked pt to call office to confirm appt date and time

## 2019-01-19 NOTE — Patient Instructions (Addendum)
STOP Amiodarone, CALL OFFICE if you have any palpitations.  LABS: Lab requisition has been sent to [facility]. They will arrange an appointment with you.  We will only contact you if something comes back abnormal or we need to make some changes. Otherwise no news is good news!  Your physician recommends that you schedule a follow-up appointment in: 4-6 weeks with Dr. Aundra Dubin via [place] video.

## 2019-01-19 NOTE — Progress Notes (Addendum)
LM for patient to call office to discuss AVS, Instructions mailed.

## 2019-01-20 NOTE — Telephone Encounter (Signed)
I called pt. Pt's meds, allergies, and PMH were updated.  Pt has never had a sleep study but does endorse snoring.  Pt was instructed on how to measure her neck prior to her appt.  Pt's weight is 201 lbs and she is 5'3.  Pt reports that she did not receive the email from [place]. I will resend the email to [email or web address]. I reminded pt that she must download the cisco webex software prior to her appt time.  Epworth Sleepiness Scale 0= would never doze 1= slight chance of dozing 2= moderate chance of dozing 3= high chance of dozing  Sitting and reading: 0 Watching TV: 2 Sitting inactive in a public place (ex. Theater or meeting): 0 As a passenger in a car for an hour without a break: 2 Lying down to rest in the afternoon: 1 Sitting and talking to someone: 0 Sitting quietly after lunch (no alcohol): 0 In a car, while stopped in traffic: 0 Total: 5  FSS: 42

## 2019-01-21 DIAGNOSIS — I471 Supraventricular tachycardia: Secondary | ICD-10-CM | POA: Diagnosis not present

## 2019-01-21 DIAGNOSIS — I313 Pericardial effusion (noninflammatory): Secondary | ICD-10-CM | POA: Diagnosis not present

## 2019-01-21 DIAGNOSIS — I429 Cardiomyopathy, unspecified: Secondary | ICD-10-CM | POA: Diagnosis not present

## 2019-01-21 DIAGNOSIS — I2699 Other pulmonary embolism without acute cor pulmonale: Secondary | ICD-10-CM | POA: Diagnosis not present

## 2019-01-21 DIAGNOSIS — I5023 Acute on chronic systolic (congestive) heart failure: Secondary | ICD-10-CM | POA: Diagnosis not present

## 2019-01-21 DIAGNOSIS — I480 Paroxysmal atrial fibrillation: Secondary | ICD-10-CM | POA: Diagnosis not present

## 2019-01-21 DIAGNOSIS — I5032 Chronic diastolic (congestive) heart failure: Secondary | ICD-10-CM | POA: Diagnosis not present

## 2019-01-21 DIAGNOSIS — I110 Hypertensive heart disease with heart failure: Secondary | ICD-10-CM | POA: Diagnosis not present

## 2019-01-27 ENCOUNTER — Other Ambulatory Visit: Payer: Self-pay

## 2019-01-27 ENCOUNTER — Ambulatory Visit: Payer: Self-pay | Admitting: Neurology

## 2019-01-27 NOTE — Telephone Encounter (Signed)
Pt did not show for their appt with Dr. Rexene Alberts today.  Please call pt and offer her a reschedule.

## 2019-01-27 NOTE — Progress Notes (Signed)
Patient did not join [employer] meeting/Virtual Visit encounter. She will be advised to reschedule.

## 2019-01-28 DIAGNOSIS — I110 Hypertensive heart disease with heart failure: Secondary | ICD-10-CM | POA: Diagnosis not present

## 2019-01-28 DIAGNOSIS — I2699 Other pulmonary embolism without acute cor pulmonale: Secondary | ICD-10-CM | POA: Diagnosis not present

## 2019-01-28 DIAGNOSIS — I313 Pericardial effusion (noninflammatory): Secondary | ICD-10-CM | POA: Diagnosis not present

## 2019-01-28 DIAGNOSIS — I480 Paroxysmal atrial fibrillation: Secondary | ICD-10-CM | POA: Diagnosis not present

## 2019-01-28 DIAGNOSIS — I5032 Chronic diastolic (congestive) heart failure: Secondary | ICD-10-CM | POA: Diagnosis not present

## 2019-01-28 DIAGNOSIS — I471 Supraventricular tachycardia: Secondary | ICD-10-CM | POA: Diagnosis not present

## 2019-01-28 DIAGNOSIS — I429 Cardiomyopathy, unspecified: Secondary | ICD-10-CM | POA: Diagnosis not present

## 2019-01-28 DIAGNOSIS — I5023 Acute on chronic systolic (congestive) heart failure: Secondary | ICD-10-CM | POA: Diagnosis not present

## 2019-01-31 DIAGNOSIS — I509 Heart failure, unspecified: Secondary | ICD-10-CM | POA: Diagnosis not present

## 2019-01-31 DIAGNOSIS — I2699 Other pulmonary embolism without acute cor pulmonale: Secondary | ICD-10-CM | POA: Diagnosis not present

## 2019-01-31 DIAGNOSIS — I10 Essential (primary) hypertension: Secondary | ICD-10-CM | POA: Diagnosis not present

## 2019-01-31 DIAGNOSIS — I4891 Unspecified atrial fibrillation: Secondary | ICD-10-CM | POA: Diagnosis not present

## 2019-01-31 NOTE — Telephone Encounter (Signed)
I called pt to inform her that the sleep study she is scheduled for on 03/19/19 is not with [place]. I told the pt that if she went through with that sleep study our office would not be able to follow her care. The pt asked me what would be the process if she did not have that sleep study. I informed her that she would have to have the consult with the doctor then we would schedule her for a sleep study. Pt stated since the sleep study was already scheduled elsewhere she would just keep that appt for the study. I informed her that was fine however, we would not be able to follow her care from that sleep study. Pt verbalized understanding.

## 2019-02-01 ENCOUNTER — Other Ambulatory Visit (HOSPITAL_COMMUNITY): Payer: Self-pay | Admitting: Cardiology

## 2019-02-01 ENCOUNTER — Telehealth (HOSPITAL_COMMUNITY): Payer: Self-pay | Admitting: *Deleted

## 2019-02-01 NOTE — Telephone Encounter (Signed)
Called and left message for pt for advisement of Cardiac Rehab continued closure due to Covid-19.  Requested call back for interest level for participation. Contact information provided. Cherre Huger, BSN Cardiac and Training and development officer

## 2019-02-03 ENCOUNTER — Encounter: Payer: Self-pay | Admitting: Physician Assistant

## 2019-02-03 DIAGNOSIS — I480 Paroxysmal atrial fibrillation: Secondary | ICD-10-CM

## 2019-02-03 DIAGNOSIS — I513 Intracardiac thrombosis, not elsewhere classified: Secondary | ICD-10-CM

## 2019-02-03 DIAGNOSIS — I240 Acute coronary thrombosis not resulting in myocardial infarction: Secondary | ICD-10-CM

## 2019-02-03 DIAGNOSIS — I10 Essential (primary) hypertension: Secondary | ICD-10-CM | POA: Insufficient documentation

## 2019-02-03 DIAGNOSIS — I428 Other cardiomyopathies: Secondary | ICD-10-CM

## 2019-02-03 HISTORY — DX: Acute coronary thrombosis not resulting in myocardial infarction: I24.0

## 2019-02-03 HISTORY — DX: Other cardiomyopathies: I42.8

## 2019-02-03 HISTORY — DX: Paroxysmal atrial fibrillation: I48.0

## 2019-02-03 NOTE — Progress Notes (Deleted)
{Choose 1 Note Type (Telehealth Visit or Telephone Visit):[phone]}   Evaluation Performed:  Follow-up visit  Date:  02/03/2019   ID:  [name] [name], Alferd Apa 1986/01/20, MRN [id number]  {Patient Location:[phone]::"Home"} {Provider Location:[phone]}  PCP:  Fanny Bien, MD  Cardiologist:  Dorris Carnes, MD *** Electrophysiologist:  None  Advanced [facility] Clinic:  Loralie Champagne, MD     Chief Complaint:  ***  History of Present Illness:    [name] [name] is a 33 y.o. female with systolic CHF 2/2 non-ischemic CM, pulmonary embolism, paroxysmal AFib, pregnancy induced HTN.  She was admitted in March 2020 with RLL pulmonary embolism in the setting of decompensated congestive heart failure, possible pneumonia.  EF was 20-25 by Echo and 16 by cMRI.  There was possible LV thrombus on MRI.  Coronary CTA demonstrated no significant CAD.  She was diuresed, placed on Apixaban for anticoagulation.  She required Amiodarone for paroxysmal AF.  She has been followed by the HF Clinic.  She was last seen 01/19/2019.   ***  The patient {does/does not:200015} have symptoms concerning for COVID-19 infection (fever, chills, cough, or new shortness of breath).    Past Medical History:  Diagnosis Date  . Combined systolic and diastolic CHF    EF 16 by cMRI in 12/2018  . Condyloma acuminata   . HTN in pregnancy, chronic 09/24/2015   [place] labs: [x ]Cr, [x]  24hr protein (208) P:Cr 92 Plat: 207 AST/ALT: 10/10    . Hx of RLL Pulmonary Embolism 12/2018 12/17/2018  . Hypertension   . LV (left ventricular) mural thrombus 02/03/2019  . Nonischemic cardiomyopathy 02/03/2019   Echo 12/2018: EF 20-25, Gr 2 DD // cMRI 12/2018:  EF 16, poss LV clot // Cor CTA 12/2018: no sig CAD  . Obesity in pregnancy    Antepartum, first trimester  . Paroxysmal atrial fibrillation ([place]) 02/03/2019   AF occurred during hosp for PE/CHF/Pneumonia 12/2018 >> Amiod continued for 1 mo post DC, then DC'd   Past Surgical History:   Procedure Laterality Date  . BIRTH CONTROL IMPLANT Left 03/2016   "Nexplanon in my upper arm"  . CESAREAN SECTION N/A 02/25/2016   Procedure: CESAREAN SECTION;  Surgeon: Truett Mainland, DO;  Location: [facility];  Service: Obstetrics;  Laterality: N/A;     No outpatient medications have been marked as taking for the 02/04/19 encounter (Appointment) with Richardson Dopp T, PA-C.     Allergies:   Patient has no known allergies.   Social History   Tobacco Use  . Smoking status: Never Smoker  . Smokeless tobacco: Never Used  Substance Use Topics  . Alcohol use: Never    Frequency: Never  . Drug use: Never     Family Hx: The patient's family history includes Heart disease in her mother; Hypertension in her mother.  ROS:   Please see the history of present illness.    *** All other systems reviewed and are negative.   Prior CV studies:   The following studies were reviewed today:  Cardiac MRI 12/24/2018 IMPRESSION: 1.  Moderate pericardial effusion without tamponade. 2. Mild-moderately dilated LV with mild LV hypertrophy. EF 16%, diffuse hypokinesis. Possible small LV thrombus. 3.  Mild RV dilation with EF 21%. 4.  Areas of non-coronary pattern LGE noted, ?myocarditis.  Coronary CTA 12/23/2018 IMPRESSION: 1. Coronary artery calcium score 0 Agatston units, suggesting low risk for future cardiac events. 2.  No significant coronary disease was noted.  LE Venous US 12/18/2018 Summary: Right: There  is no evidence of deep vein thrombosis in the lower extremity. No cystic structure found in the popliteal fossa. Left: There is no evidence of deep vein thrombosis in the lower extremity. However, portions of this examination were limited- see technologist comments above. No cystic structure found in the popliteal fossa.  Echo 12/18/2018 EF 20-25, mild conc LVH, Gr 2 DD, mod reduced RVSF, severe LAE, mod pericardial eff  Renal Art [place] 08/30/18 Bilat RA 1-59    Labs/Other  Tests and Data Reviewed:    EKG:  {EKG/Telemetry Strips Reviewed:[phone]}  Recent Labs: 12/16/2018: NT-Pro BNP 3,613 12/18/2018: TSH 1.236 12/19/2018: B Natriuretic Peptide 192.4 12/26/2018: Magnesium 1.9 12/27/2018: ALT 23; BUN 11; Creatinine, Ser 0.95; Hemoglobin 9.3; Platelets 386; Potassium 4.4; Sodium 137   Recent Lipid Panel Lab Results  Component Value Date/Time   CHOL 193 11/04/2016 09:26 AM   TRIG 103 11/04/2016 09:26 AM   HDL 36 (L) 11/04/2016 09:26 AM   CHOLHDL 5.4 (H) 11/04/2016 09:26 AM   LDLCALC 136 (H) 11/04/2016 09:26 AM    Wt Readings from Last 3 Encounters:  12/23/18 210 lb 12.2 oz (95.6 kg)  12/16/18 236 lb 12.8 oz (107.4 kg)  08/29/18 233 lb 14.5 oz (106.1 kg)     Objective:    Vital Signs:  There were no vitals taken for this visit.   {HeartCare Virtual Exam (Optional):[phone]::"VITAL SIGNS:  reviewed"}  ASSESSMENT & PLAN:    1. ***  COVID-19 Education: The signs and symptoms of COVID-19 were discussed with the patient and how to seek care for testing (follow up with PCP or arrange E-visit).  ***The importance of social distancing was discussed today.  Time:   Today, I have spent *** minutes with the patient with telehealth technology discussing the above problems.     Medication Adjustments/Labs and Tests Ordered: Current medicines are reviewed at length with the patient today.  Concerns regarding medicines are outlined above.   Tests Ordered: No orders of the defined types were placed in this encounter.   Medication Changes: No orders of the defined types were placed in this encounter.   Disposition:  Follow up {follow up:15908}  Signed, Richardson Dopp, PA-C  02/03/2019 7:51 PM    [facility]

## 2019-02-04 ENCOUNTER — Telehealth: Payer: 59 | Admitting: Physician Assistant

## 2019-02-11 DIAGNOSIS — I429 Cardiomyopathy, unspecified: Secondary | ICD-10-CM | POA: Diagnosis not present

## 2019-02-11 DIAGNOSIS — I110 Hypertensive heart disease with heart failure: Secondary | ICD-10-CM | POA: Diagnosis not present

## 2019-02-11 DIAGNOSIS — I313 Pericardial effusion (noninflammatory): Secondary | ICD-10-CM | POA: Diagnosis not present

## 2019-02-11 DIAGNOSIS — I471 Supraventricular tachycardia: Secondary | ICD-10-CM | POA: Diagnosis not present

## 2019-02-11 DIAGNOSIS — I480 Paroxysmal atrial fibrillation: Secondary | ICD-10-CM | POA: Diagnosis not present

## 2019-02-11 DIAGNOSIS — I2699 Other pulmonary embolism without acute cor pulmonale: Secondary | ICD-10-CM | POA: Diagnosis not present

## 2019-02-11 DIAGNOSIS — I5023 Acute on chronic systolic (congestive) heart failure: Secondary | ICD-10-CM | POA: Diagnosis not present

## 2019-02-11 DIAGNOSIS — I5032 Chronic diastolic (congestive) heart failure: Secondary | ICD-10-CM | POA: Diagnosis not present

## 2019-02-25 DIAGNOSIS — I5032 Chronic diastolic (congestive) heart failure: Secondary | ICD-10-CM | POA: Diagnosis not present

## 2019-02-25 DIAGNOSIS — I5023 Acute on chronic systolic (congestive) heart failure: Secondary | ICD-10-CM | POA: Diagnosis not present

## 2019-02-25 DIAGNOSIS — I2699 Other pulmonary embolism without acute cor pulmonale: Secondary | ICD-10-CM | POA: Diagnosis not present

## 2019-02-25 DIAGNOSIS — I480 Paroxysmal atrial fibrillation: Secondary | ICD-10-CM | POA: Diagnosis not present

## 2019-02-25 DIAGNOSIS — I429 Cardiomyopathy, unspecified: Secondary | ICD-10-CM | POA: Diagnosis not present

## 2019-02-25 DIAGNOSIS — I471 Supraventricular tachycardia: Secondary | ICD-10-CM | POA: Diagnosis not present

## 2019-02-25 DIAGNOSIS — I313 Pericardial effusion (noninflammatory): Secondary | ICD-10-CM | POA: Diagnosis not present

## 2019-02-25 DIAGNOSIS — I110 Hypertensive heart disease with heart failure: Secondary | ICD-10-CM | POA: Diagnosis not present

## 2019-03-02 ENCOUNTER — Ambulatory Visit (HOSPITAL_COMMUNITY)
Admission: RE | Admit: 2019-03-02 | Discharge: 2019-03-02 | Disposition: A | Discharge status: Home / Self Care | Payer: 59 | Source: Ambulatory Visit | Attending: Cardiology | Admitting: Cardiology

## 2019-03-02 ENCOUNTER — Encounter (HOSPITAL_COMMUNITY): Payer: Self-pay

## 2019-03-02 ENCOUNTER — Other Ambulatory Visit: Payer: Self-pay

## 2019-03-02 VITALS — BP 130/90 | Wt 202.0 lb

## 2019-03-02 DIAGNOSIS — I5022 Chronic systolic (congestive) heart failure: Secondary | ICD-10-CM | POA: Diagnosis not present

## 2019-03-02 DIAGNOSIS — I428 Other cardiomyopathies: Secondary | ICD-10-CM

## 2019-03-02 DIAGNOSIS — Z6841 Body Mass Index (BMI) 40.0 and over, adult: Secondary | ICD-10-CM

## 2019-03-02 DIAGNOSIS — I240 Acute coronary thrombosis not resulting in myocardial infarction: Secondary | ICD-10-CM

## 2019-03-02 MED ORDER — SACUBITRIL-VALSARTAN 49-51 MG PO TABS: 1.0000 | ORAL_TABLET | Freq: Two times a day (BID) | ORAL | 6 refills | Status: DC

## 2019-03-02 MED ORDER — SPIRONOLACTONE 25 MG PO TABS: 25.0000 mg | ORAL_TABLET | Freq: Every day | ORAL | 6 refills | Status: DC

## 2019-03-02 MED ORDER — FUROSEMIDE 40 MG PO TABS: 40.0000 mg | ORAL_TABLET | Freq: Every day | ORAL | 6 refills | Status: DC

## 2019-03-02 MED ORDER — APIXABAN 5 MG PO TABS: 5.0000 mg | ORAL_TABLET | Freq: Two times a day (BID) | ORAL | 6 refills | Status: DC

## 2019-03-02 MED ORDER — ISOSORB DINITRATE-HYDRALAZINE 20-37.5 MG PO TABS: 2.0000 | ORAL_TABLET | Freq: Three times a day (TID) | ORAL | 6 refills | Status: DC

## 2019-03-02 NOTE — Progress Notes (Signed)
Heart Failure TeleHealth Note  Due to national recommendations of social distancing due to Kiefer 19, telehealth visit is felt to be most appropriate for this patient at this time.  I discussed the limitations, risks, security and privacy concerns of performing an evaluation and management service by telephone and the availability of in person appointments. I also discussed with the patient that there may be a patient responsible charge related to this service. The patient expressed understanding and agreed to proceed.   ID:  [name] [name], DOB 1986-07-07, MRN [id number]  Location: Home  Provider location: [address] [place] Type of Visit: Established patient   PCP:  Fanny Bien, MD  Cardiologist:  Dorris Carnes, MD Primary HF: Dr Aundra Dubin  Chief Complaint: Heart Failure   History of Present Illness: [name] [name] is a 33 y.o. female with a history of pregnancy induced HTN, new systolic HF with EF 50% on cMRI (12/2018), acute PE 12/2018 on Eliquis, possible LV thrombus, and PAF on eliquis and amio,   Admitted 12/17/18 with 30 lb weight gain. CTA showed RLL PE with no obvious right heart strain. She was started on heparin and transitioned to Eliquis prior to DC. Echo showed EF 20-25% with moderately reduced RV. HF team consulted. She was diuresed with lasix drip and metolazone. Coronary CT showed no CAD. HF medications were optimized. cMRI showed EF 16% with possible LV thrombus, RV EF 21%, and LGE pattern that could be consistent with myocarditis. Coox was monitored and remained normal. She had an episode of atrial fibrillation that converted with IV amio. She was transitioned to oral amio with plans to stop after 1 month and watch for recurrence. She was also treated for PNA by primary team. DC weight: 210 lbs.   Patient presents via audio conferencing for a telehealth visit today. Last visit amiodarone was stopped.  Overall feeling fine. Denies SOB/PND/Orthopnea. Walking 2  miles a day. Appetite ok. No fever or chills. Weight at home 201-202 pounds. Taking all medications but she ran out of eliquis 2 weeks ago. Says her PCP wanted to switch to another anticoagulant but she wanted to wait until she spoke to our team.   Pt denies symptoms of cough, fevers, chills, or new SOB worrisome for COVID 19.    Past Medical History:  Diagnosis Date  . Combined systolic and diastolic CHF    EF 16 by cMRI in 12/2018  . Condyloma acuminata   . HTN in pregnancy, chronic 09/24/2015   [place] labs: [x ]Cr, [x]  24hr protein (208) P:Cr 92 Plat: 207 AST/ALT: 10/10    . Hx of RLL Pulmonary Embolism 12/2018 12/17/2018  . Hypertension   . LV (left ventricular) mural thrombus 02/03/2019  . Nonischemic cardiomyopathy 02/03/2019   Echo 12/2018: EF 20-25, Gr 2 DD // cMRI 12/2018:  EF 16, poss LV clot // Cor CTA 12/2018: no sig CAD  . Obesity in pregnancy    Antepartum, first trimester  . Paroxysmal atrial fibrillation ([name]) 02/03/2019   AF occurred during hosp for PE/CHF/Pneumonia 12/2018 >> Amiod continued for 1 mo post DC, then DC'd   Past Surgical History:  Procedure Laterality Date  . BIRTH CONTROL IMPLANT Left 03/2016   "Nexplanon in my upper arm"  . CESAREAN SECTION N/A 02/25/2016   Procedure: CESAREAN SECTION;  Surgeon: Truett Mainland, DO;  Location: [place];  Service: Obstetrics;  Laterality: N/A;     Current Outpatient Medications  Medication Sig Dispense Refill  .  acetaminophen (TYLENOL) 325 MG tablet Take 2 tablets (650 mg total) by mouth every 6 (six) hours as needed for mild pain, fever or headache.    . carvedilol (COREG) 12.5 MG tablet Take 1 tablet (12.5 mg total) by mouth 2 (two) times daily with a meal. 90 tablet 3  . furosemide (LASIX) 40 MG tablet Take 1 tablet (40 mg total) by mouth daily. 90 tablet 3  . isosorbide-hydrALAZINE (BIDIL) 20-37.5 MG tablet Take 1 tablet by mouth 3 (three) times daily.    . sacubitril-valsartan (ENTRESTO) 49-51 MG Take 1  tablet by mouth 2 (two) times daily. 60 tablet 6  . spironolactone (ALDACTONE) 25 MG tablet Take 1 tablet (25 mg total) by mouth at bedtime. 30 tablet 3  . Eliquis DVT/PE Starter Pack (ELIQUIS STARTER PACK) 5 MG TABS Take as directed on package: start with two-5mg  tablets twice daily for 7 days. On day 8, switch to one-5mg  tablet twice daily. 1 each 0  . ferrous sulfate 325 (65 FE) MG tablet Take 1 tablet (325 mg total) by mouth daily with breakfast. (Patient not taking: Reported on 03/02/2019) 30 tablet 0  . isosorbide-hydrALAZINE (BIDIL) 20-37.5 MG tablet Take 0.5-1 tablets by mouth 3 (three) times daily. Start with 0.5 mg three times a day until instructed to increase by heart failure MD (Patient not taking: Reported on 03/02/2019) 90 tablet 3   No current facility-administered medications for this encounter.     Allergies:   Patient has no known allergies.   Social History:  The patient  reports that she has never smoked. She has never used smokeless tobacco. She reports that she does not drink alcohol or use drugs.   Family History:  The patient's family history includes Heart disease in her mother; Hypertension in her mother.   ROS:  Please see the history of present illness.   All other systems are personally reviewed and negative.    Vitals:   03/02/19 1047  BP: 130/90    Exam: Tele Health Call; Exam is subjective  General:  Speaks in full sentences. No resp difficulty. Lungs: Normal respiratory effort with conversation.  Abdomen: No distension per patient report Extremities: Pt denies edema. Neuro: Alert & oriented x 3.   Recent Labs: 12/16/2018: NT-Pro BNP 3,613 12/18/2018: TSH 1.236 12/19/2018: B Natriuretic Peptide 192.4 12/26/2018: Magnesium 1.9 12/27/2018: ALT 23; BUN 11; Creatinine, Ser 0.95; Hemoglobin 9.3; Platelets 386; Potassium 4.4; Sodium 137  Personally reviewed   Wt Readings from Last 3 Encounters:  03/02/19 91.6 kg (202 lb)  12/23/18 95.6 kg (210 lb 12.2 oz)   12/16/18 107.4 kg (236 lb 12.8 oz)      ASSESSMENT AND PLAN:  1. Chronic systolic CHF: She has a nonischemic cardiomyopathy, coronary CTA showed no CAD. EF was reportedly low on echo at hospital in [facility] in 12/19. Echo 12/18/18: EF 20-25% with moderate RV systolic dysfunction. As above, no recent pregnancy so doubt peri-partum CMP. No family history of CHF and coronary CTA showed no CAD. Cardiac MRI showed LV EF 16% with possible small LV thrombus, RV EF 21%. Delayed enhancement imaging showed non-coronary pattern LGE that could be consistent with myocarditis.  - NYHA I-II. Volume sounds stable.  - Continue Lasix 40 mg daily. She can take an extra 20 mg PRN.  - CHeck BMEt next week.   - Increase bidil 2 tablets  -Continue spironolactone25 mg daily.  -Continue coreg 12.5 mg BID.  -Continue Entresto49/51 bid. - Discussed the importance of birth control  while on HF medications. She follows at [facility] and is considering non-hormonal options. Advised her to use condoms for now. - Repeat echo scheduled for June. Discussed that she may need EP referral for ICD +/- possibly advanced therapies if EF does not improve  2. Pulmonary embolus:She has no family history of VTE.  - Restart eliquis 5 mg twice a day. She has been out for at least 2 weeks.   3. Atrial fibrillation: Paroxysmal.Likely triggered by CHF and PE. Was in NSR prior to DC. No hx.  - Restart Eliquis 5 mg BID. Check CBC next week.  - Atrial fibrillation likely triggered by PE/active CHF/PNA. She is now off amiodarone.   4. Possible small LV thrombus: Restart Eliquis as noted above.  Denies bleeding.   5. Suspected OSA - She is scheduled for sleep study 6/6.   6. Obesity Body mass index is 35.22 kg/m. Discussed portion control.     COVID screen The patient does not have any symptoms that suggest any further testing/ screening at this time.  Social distancing reinforced today.  Patient Risk: After  full review of this patients clinical status, I feel that they are at moderate risk for cardiac decompensation at this time.  Orders/Follow up: Restart   Check BMET and CBC next week in the clinic. Restart eliquis 5 mg twice a day. Increase bidil to 2 tablets 3 times a day. Refill entresto, lasix, and spironolactone.  Follow up in 2 weeks for telehealth visit with Tahir Blank.   Has follow up with echo with Dr Aundra Dubin 6/23.  Today, I have spent 21 minutes with the patient with telehealth technology discussing the above.   Jeanmarie Hubert, NP  03/02/2019 10:52 AM   [facility] [address] [facility] [place] [address] [phone] (office) [phone] (fax)

## 2019-03-02 NOTE — Patient Instructions (Addendum)
Labs on Mar 10, 2019 at 12:30p We will only contact you if something comes back abnormal or we need to make some changes. Otherwise no news is good news!  Restart eliquis 5 mg (1 tab)  twice a day.   Increase bidil to 2 tablets 3 times a day.   Refills for entresto, lasix, and spironolactone all sent to [place]  Follow up in 2 weeks for telehealth visit with Amy: March 18, 2019 12pm.

## 2019-03-02 NOTE — Addendum Note (Signed)
Encounter addended by: Valeda Malm, RN on: 03/02/2019 12:16 PM  Actions taken: Medication long-term status modified, Pharmacy for encounter modified, Order list changed, Diagnosis association updated, Clinical Note Signed

## 2019-03-02 NOTE — Progress Notes (Signed)
Spoke with patient, AVS discussed. Questions answered. Appts made for labs and f/u.  Instructions sent via mychart, ok per patient.

## 2019-03-10 ENCOUNTER — Ambulatory Visit (HOSPITAL_COMMUNITY): Admission: RE | Admit: 2019-03-10 | Payer: 59 | Source: Ambulatory Visit

## 2019-03-10 ENCOUNTER — Other Ambulatory Visit: Payer: Self-pay

## 2019-03-16 ENCOUNTER — Other Ambulatory Visit (HOSPITAL_COMMUNITY)
Admission: RE | Admit: 2019-03-16 | Discharge: 2019-03-16 | Disposition: A | Discharge status: Home / Self Care | Payer: 59 | Source: Ambulatory Visit | Attending: Cardiology | Admitting: Cardiology

## 2019-03-16 ENCOUNTER — Other Ambulatory Visit: Payer: Self-pay

## 2019-03-16 DIAGNOSIS — Z1159 Encounter for screening for other viral diseases: Secondary | ICD-10-CM | POA: Insufficient documentation

## 2019-03-17 ENCOUNTER — Telehealth: Payer: Self-pay

## 2019-03-17 LAB — NOVEL CORONAVIRUS, NAA (HOSP ORDER, SEND-OUT TO REF LAB; TAT 18-24 HRS): SARS-CoV-2, NAA: NOT DETECTED

## 2019-03-17 NOTE — Telephone Encounter (Signed)
Notes recorded by Frederik Schmidt, RN on 03/17/2019 at 2:17 PM EDT Lpm with results 6/4 ------

## 2019-03-17 NOTE — Telephone Encounter (Signed)
-----   Message from [name], MD sent at 03/17/2019  2:01 PM EDT ----- Please let patient know that labs were normal.  Continue current medical therapy.

## 2019-03-18 ENCOUNTER — Encounter (HOSPITAL_COMMUNITY): Payer: 59

## 2019-03-18 ENCOUNTER — Telehealth: Payer: Self-pay

## 2019-03-18 NOTE — Telephone Encounter (Signed)
Spoke to patient about Corona Virus negative results.  She verbalized understanding.

## 2019-03-19 ENCOUNTER — Ambulatory Visit (HOSPITAL_BASED_OUTPATIENT_CLINIC_OR_DEPARTMENT_OTHER): Payer: 59 | Attending: Cardiology | Admitting: Cardiology

## 2019-03-19 ENCOUNTER — Other Ambulatory Visit: Payer: Self-pay

## 2019-03-19 VITALS — Ht 63.0 in | Wt 205.0 lb

## 2019-03-19 DIAGNOSIS — R0683 Snoring: Secondary | ICD-10-CM | POA: Insufficient documentation

## 2019-03-19 DIAGNOSIS — I5042 Chronic combined systolic (congestive) and diastolic (congestive) heart failure: Secondary | ICD-10-CM

## 2019-03-21 ENCOUNTER — Other Ambulatory Visit: Payer: Self-pay

## 2019-03-24 ENCOUNTER — Telehealth (HOSPITAL_COMMUNITY): Payer: Self-pay

## 2019-03-24 NOTE — Telephone Encounter (Signed)

## 2019-03-25 ENCOUNTER — Encounter (HOSPITAL_COMMUNITY): Payer: 59

## 2019-03-25 ENCOUNTER — Ambulatory Visit (HOSPITAL_COMMUNITY)
Admission: RE | Admit: 2019-03-25 | Discharge: 2019-03-25 | Disposition: A | Discharge status: Home / Self Care | Payer: 59 | Source: Ambulatory Visit | Attending: Internal Medicine | Admitting: Internal Medicine

## 2019-03-25 ENCOUNTER — Other Ambulatory Visit: Payer: Self-pay

## 2019-03-25 ENCOUNTER — Encounter (HOSPITAL_COMMUNITY): Payer: Self-pay

## 2019-03-25 VITALS — BP 156/90 | HR 69 | Wt 208.6 lb

## 2019-03-25 DIAGNOSIS — Z6835 Body mass index (BMI) 35.0-35.9, adult: Secondary | ICD-10-CM | POA: Insufficient documentation

## 2019-03-25 DIAGNOSIS — I480 Paroxysmal atrial fibrillation: Secondary | ICD-10-CM | POA: Insufficient documentation

## 2019-03-25 DIAGNOSIS — I2699 Other pulmonary embolism without acute cor pulmonale: Secondary | ICD-10-CM | POA: Insufficient documentation

## 2019-03-25 DIAGNOSIS — E669 Obesity, unspecified: Secondary | ICD-10-CM | POA: Insufficient documentation

## 2019-03-25 DIAGNOSIS — I5042 Chronic combined systolic (congestive) and diastolic (congestive) heart failure: Secondary | ICD-10-CM | POA: Insufficient documentation

## 2019-03-25 DIAGNOSIS — I428 Other cardiomyopathies: Secondary | ICD-10-CM | POA: Diagnosis not present

## 2019-03-25 DIAGNOSIS — Z1159 Encounter for screening for other viral diseases: Secondary | ICD-10-CM | POA: Diagnosis not present

## 2019-03-25 DIAGNOSIS — Z79899 Other long term (current) drug therapy: Secondary | ICD-10-CM | POA: Diagnosis not present

## 2019-03-25 DIAGNOSIS — Z86711 Personal history of pulmonary embolism: Secondary | ICD-10-CM | POA: Diagnosis not present

## 2019-03-25 DIAGNOSIS — Z113 Encounter for screening for infections with a predominantly sexual mode of transmission: Secondary | ICD-10-CM | POA: Diagnosis not present

## 2019-03-25 DIAGNOSIS — I110 Hypertensive heart disease with heart failure: Secondary | ICD-10-CM | POA: Insufficient documentation

## 2019-03-25 DIAGNOSIS — Z7901 Long term (current) use of anticoagulants: Secondary | ICD-10-CM | POA: Insufficient documentation

## 2019-03-25 DIAGNOSIS — Z114 Encounter for screening for human immunodeficiency virus [HIV]: Secondary | ICD-10-CM | POA: Diagnosis not present

## 2019-03-25 DIAGNOSIS — Z8249 Family history of ischemic heart disease and other diseases of the circulatory system: Secondary | ICD-10-CM | POA: Diagnosis not present

## 2019-03-25 DIAGNOSIS — Z01419 Encounter for gynecological examination (general) (routine) without abnormal findings: Secondary | ICD-10-CM | POA: Diagnosis not present

## 2019-03-25 DIAGNOSIS — Z6836 Body mass index (BMI) 36.0-36.9, adult: Secondary | ICD-10-CM | POA: Diagnosis not present

## 2019-03-25 LAB — BASIC METABOLIC PANEL
Anion gap: 10 (ref 5–15)
BUN: 22 mg/dL — ABNORMAL HIGH (ref 6–20)
CO2: 24 mmol/L (ref 22–32)
Calcium: 9 mg/dL (ref 8.9–10.3)
Chloride: 102 mmol/L (ref 98–111)
Creatinine, Ser: 1.22 mg/dL — ABNORMAL HIGH (ref 0.44–1.00)
GFR calc Af Amer: >60 mL/min (ref >60)
GFR calc non Af Amer: 59 mL/min — ABNORMAL LOW (ref >60)
Glucose, Bld: 99 mg/dL (ref 70–99)
Potassium: 3.6 mmol/L (ref 3.5–5.1)
Sodium: 136 mmol/L (ref 135–145)

## 2019-03-25 MED ORDER — SACUBITRIL-VALSARTAN 97-103 MG PO TABS: 1.0000 | ORAL_TABLET | Freq: Two times a day (BID) | ORAL | 11 refills | Status: DC

## 2019-03-25 NOTE — Procedures (Signed)
    Patient Name: [name], [name] [name] Referring Provider: Loralie Champagne Height (inches): 12 Interpreting Physician: Fransico Him MD, ABSM Weight (lbs): 205 RPSGT: Lanae Boast BMI: 36 MRN: [id number] Neck Size: 15.00  CLINICAL INFORMATION Sleep Study Type: NPSG  Indication for sleep study: Snoring  Epworth Sleepiness Score: 5  SLEEP STUDY TECHNIQUE As per the AASM Manual for the Scoring of Sleep and Associated Events v2.3 (April 2016) with a hypopnea requiring 4% desaturations.  The channels recorded and monitored were frontal, central and occipital EEG, electrooculogram (EOG), submentalis EMG (chin), nasal and oral airflow, thoracic and abdominal wall motion, anterior tibialis EMG, snore microphone, electrocardiogram, and pulse oximetry.  MEDICATIONS Medications self-administered by patient taken the night of the study : N/A  SLEEP ARCHITECTURE The study was initiated at 10:26:22 PM and ended at 5:21:45 AM.  Sleep onset time was 40.9 minutes and the sleep efficiency was 82.8%. The total sleep time was 344 minutes.  Stage REM latency was 166.0 minutes.  The patient spent 2.5% of the night in stage N1 sleep, 62.4% in stage N2 sleep, 13.2% in stage N3 and 22% in REM.  Alpha intrusion was absent.  Supine sleep was 48.69%.  RESPIRATORY PARAMETERS The overall apnea/hypopnea index (AHI) was 0.5 per hour. There were 3 total apneas, including 0 obstructive, 3 central and 0 mixed apneas. There were 0 hypopneas and 1 RERAs.  The AHI during Stage REM sleep was 1.6 per hour.  AHI while supine was 0.4 per hour.  The mean oxygen saturation was 97.4%. The minimum SpO2 during sleep was 93.0%.  moderate snoring was noted during this study.  CARDIAC DATA The 2 lead EKG demonstrated sinus rhythm. The mean heart rate was 58.9 beats per minute. Other EKG findings include: PVCs.  LEG MOVEMENT  DATA The total PLMS were 0 with a resulting PLMS index of 0.0. Associated arousal with leg movement index was 0.0 .  IMPRESSIONS - No significant obstructive sleep apnea occurred during this study (AHI = 0.5/h). - No significant central sleep apnea occurred during this study (CAI = 0.5/h). - The patient had minimal or no oxygen desaturation during the study (Min O2 = 93.0%) - The patient snored with moderate snoring volume. - EKG findings include PVCs. - Clinically significant periodic limb movements did not occur during sleep. No significant associated arousals.  DIAGNOSIS - Normal Study  RECOMMENDATIONS - Avoid alcohol, sedatives and other CNS depressants that may worsen sleep apnea and disrupt normal sleep architecture. - Sleep hygiene should be reviewed to assess factors that may improve sleep quality. - Weight management and regular exercise should be initiated or continued if appropriate.  [Electronically signed] 03/25/2019 07:[name] PM  Fransico Him MD, ABSM Diplomate, American Board of Sleep Medicine

## 2019-03-25 NOTE — Patient Instructions (Signed)
INCREASE Entresto to 97/103mg  (1 tab) twice a day.  May double current dose (49/51mg - 2 tabs twice a day) until it runs out.  Labs today and repeat 1 week We will only contact you if something comes back abnormal or we need to make some changes. Otherwise no news is good news!  Your physician has requested that you have an echocardiogram next week. Echocardiography is a painless test that uses sound waves to create images of your heart. It provides your doctor with information about the size and shape of your heart and how well your heart's chambers and valves are working. This procedure takes approximately one hour. There are no restrictions for this procedure.  Your physician recommends that you schedule a follow-up appointment in: 6 weeks with Dr Aundra Dubin

## 2019-03-25 NOTE — Progress Notes (Signed)
PCP: Dr Ernie Hew  Cardiology: Dr Harrington Challenger Primary HF Cardiologist: Dr Aundra Dubin   HPI: [name] [name] is a 33 y.o. female with a history of pregnancy induced HTN, new systolic HF with EF 58% on cMRI (12/2018), acute PE 12/2018 on Eliquis, possible LV thrombus, and PAF on eliquis and amio,   Admitted 12/17/18 with 30 lb weight gain. CTA showed RLL PE with no obvious right heart strain. She was started on heparin and transitioned to Eliquis prior to DC. Echo showed EF 20-25% with moderately reduced RV. HF team consulted. She was diuresed with lasix drip and metolazone. Coronary CT showed no CAD. HF medications were optimized. cMRI showed EF 16% with possible LV thrombus, RV EF 21%, and LGE pattern that could be consistent with myocarditis. Coox was monitored and remained normal. She had an episode of atrial fibrillation that converted with IV amio. She was transitioned to oral amio with plans to stop after 1 month and watch for recurrence. She was also treated for PNA by primary team. DC weight: 210 lbs.  Today she returns for HF follow up. Last visit bidil was increased to 2 tabs three times a day and eliquis was restarted. Overall feeling fine. Denies SOB/PND/Orthopnea.No bleeding issues. Walking 30 minutes a day. Appetite ok. No fever or chills. Weight at home 202-205 pounds. Taking all medications. Working from home.    ROS: All systems negative except as listed in HPI, PMH and Problem List.  SH:  Social History   Socioeconomic History  . Marital status: Single    Spouse name: n/a  . Number of children: 0  . Years of education: college  . Highest education level: Not on file  Occupational History  . Occupation: Therapist, art Rep    Employer: [employer]  . Financial resource strain: Not on file  . Food insecurity    Worry: Not on file    Inability: Not on file  . Transportation needs    Medical: Not on file    Non-medical: Not on file  Tobacco Use  . Smoking status: Never  Smoker  . Smokeless tobacco: Never Used  Substance and Sexual Activity  . Alcohol use: Never    Frequency: Never  . Drug use: Never  . Sexual activity: Yes    Birth control/protection: None  Lifestyle  . Physical activity    Days per week: Not on file    Minutes per session: Not on file  . Stress: Not on file  Relationships  . Social Herbalist on phone: Not on file    Gets together: Not on file    Attends religious service: Not on file    Active member of club or organization: Not on file    Attends meetings of clubs or organizations: Not on file    Relationship status: Not on file  . Intimate partner violence    Fear of current or ex partner: Not on file    Emotionally abused: Not on file    Physically abused: Not on file    Forced sexual activity: Not on file  Other Topics Concern  . Not on file  Social History Narrative   Lives alone.  No family lives nearby.    FH:  Family History  Problem Relation Age of Onset  . Hypertension Mother   . Heart disease Mother     Past Medical History:  Diagnosis Date  . Combined systolic and diastolic CHF    EF 16  by cMRI in 12/2018  . Condyloma acuminata   . HTN in pregnancy, chronic 09/24/2015   [place] labs: [x ]Cr, [x]  24hr protein (208) P:Cr 92 Plat: 207 AST/ALT: 10/10    . Hx of RLL Pulmonary Embolism 12/2018 12/17/2018  . Hypertension   . LV (left ventricular) mural thrombus 02/03/2019  . Nonischemic cardiomyopathy 02/03/2019   Echo 12/2018: EF 20-25, Gr 2 DD // cMRI 12/2018:  EF 16, poss LV clot // Cor CTA 12/2018: no sig CAD  . Obesity in pregnancy    Antepartum, first trimester  . Paroxysmal atrial fibrillation ([place]) 02/03/2019   AF occurred during hosp for PE/CHF/Pneumonia 12/2018 >> Amiod continued for 1 mo post DC, then DC'd    Current Outpatient Medications  Medication Sig Dispense Refill  . acetaminophen (TYLENOL) 325 MG tablet Take 2 tablets (650 mg total) by mouth every 6 (six) hours as needed for mild  pain, fever or headache.    Marland Kitchen apixaban (ELIQUIS) 5 MG TABS tablet Take 1 tablet (5 mg total) by mouth 2 (two) times daily. 60 tablet 6  . carvedilol (COREG) 12.5 MG tablet Take 1 tablet (12.5 mg total) by mouth 2 (two) times daily with a meal. 90 tablet 3  . ferrous sulfate 325 (65 FE) MG tablet Take 1 tablet (325 mg total) by mouth daily with breakfast. 30 tablet 0  . furosemide (LASIX) 40 MG tablet Take 1 tablet (40 mg total) by mouth daily. 30 tablet 6  . isosorbide-hydrALAZINE (BIDIL) 20-37.5 MG tablet Take 2 tablets by mouth 3 (three) times daily. 180 tablet 6  . sacubitril-valsartan (ENTRESTO) 49-51 MG Take 1 tablet by mouth 2 (two) times daily. 60 tablet 6  . spironolactone (ALDACTONE) 25 MG tablet Take 1 tablet (25 mg total) by mouth at bedtime. 30 tablet 6   No current facility-administered medications for this encounter.     Vitals:   03/25/19 0902  BP: (!) 156/90  Pulse: 69  SpO2: 99%  Weight: 94.6 kg (208 lb 9.6 oz)   Wt Readings from Last 3 Encounters:  03/25/19 94.6 kg (208 lb 9.6 oz)  03/19/19 93 kg (205 lb)  03/02/19 91.6 kg (202 lb)    PHYSICAL EXAM:  General:  Well appearing. No resp difficulty HEENT: normal Neck: supple. JVP flat. Carotids 2+ bilaterally; no bruits. No lymphadenopathy or thryomegaly appreciated. Cor: PMI normal. Regular rate & rhythm. No rubs, gallops or murmurs. Lungs: clear Abdomen: soft, nontender, nondistended. No hepatosplenomegaly. No bruits or masses. Good bowel sounds. Extremities: no cyanosis, clubbing, rash, edema Neuro: alert & orientedx3, cranial nerves grossly intact. Moves all 4 extremities w/o difficulty. Affect pleasant.      ASSESSMENT & PLAN: 1. Chronic systolic CHF: She has a nonischemic cardiomyopathy, coronary CTA showed no CAD. EF was reportedly low on echo at hospital in [facility] in 12/19. Echo 12/18/18: EF 20-25% with moderate RV systolic dysfunction. As above, no recent pregnancy so doubt peri-partum CMP. No  family history of CHF and coronary CTA showed no CAD. Cardiac MRI showed LV EF 16% with possible small LV thrombus, RV EF 21%. Delayed enhancement imaging showed non-coronary pattern LGE that could be consistent with myocarditis.  - NYHA I. Volume status stable. Continue Lasix 40 mg daily.She can take an extra 20 mg PRN.  - Continue bidil 2 tablets tid -Continue spironolactone25 mg daily.  -Continuecoreg 12.5 mg BID.  -Increase entresto to 97-103 mg twice a day.  -Check BMET today and in 7 days.  - Repeat ECHO.  -  Discussed the importance of birth control while on HF medications. She follows at [facility] and is considering non-hormonal options. She has follow up later today.  -  Discussed that she may need EP referral for ICD +/- possibly advanced therapies if EF does not improve  2. Pulmonary embolus:She has no family history of VTE.  - Continue eliquis 5 mg twice a day.  3. Atrial fibrillation: Paroxysmal.Likely triggered by CHF and PE.  - Continue Restart Eliquis 5 mg BID.  - Atrial fibrillation likely triggered by PE/active CHF/PNA.  4. Possible small LV thrombus: Continue  Eliquis. Repeat ECHO   5. Suspected OSA - Had sleep study. Results are pending.   6. Obesity Body mass index is 35.22 kg/m. Discussed portion control.    Follow up next week with an ECHO. Follow up in 6 weeks with Dr Aundra Dubin.

## 2019-03-30 ENCOUNTER — Telehealth: Payer: Self-pay | Admitting: *Deleted

## 2019-03-30 NOTE — Telephone Encounter (Addendum)
Informed patient of sleep study results are in. Patient understands her sleep study showed No significant obstructive sleep apnea occurred during this study (AHI = 0.5/h). Normal Study. Pt is aware and agreeable to normal results. Patient agrees with treatment and thanked me for call.

## 2019-03-31 ENCOUNTER — Other Ambulatory Visit: Payer: Self-pay

## 2019-03-31 ENCOUNTER — Ambulatory Visit (HOSPITAL_COMMUNITY)
Admission: RE | Admit: 2019-03-31 | Discharge: 2019-03-31 | Disposition: A | Discharge status: Home / Self Care | Payer: 59 | Source: Ambulatory Visit | Attending: Family Medicine | Admitting: Family Medicine

## 2019-03-31 ENCOUNTER — Ambulatory Visit (HOSPITAL_COMMUNITY)
Admission: RE | Admit: 2019-03-31 | Discharge: 2019-03-31 | Disposition: A | Discharge status: Home / Self Care | Payer: 59 | Source: Ambulatory Visit

## 2019-03-31 DIAGNOSIS — I5042 Chronic combined systolic (congestive) and diastolic (congestive) heart failure: Secondary | ICD-10-CM

## 2019-03-31 LAB — BASIC METABOLIC PANEL
Anion gap: 8 (ref 5–15)
BUN: 19 mg/dL (ref 6–20)
CO2: 27 mmol/L (ref 22–32)
Calcium: 9.2 mg/dL (ref 8.9–10.3)
Chloride: 105 mmol/L (ref 98–111)
Creatinine, Ser: 1.14 mg/dL — ABNORMAL HIGH (ref 0.44–1.00)
GFR calc Af Amer: >60 mL/min (ref >60)
GFR calc non Af Amer: >60 mL/min (ref >60)
Glucose, Bld: 90 mg/dL (ref 70–99)
Potassium: 3.7 mmol/L (ref 3.5–5.1)
Sodium: 140 mmol/L (ref 135–145)

## 2019-03-31 NOTE — Progress Notes (Signed)
2D Echocardiogram has been performed.  Oneal Deputy Rashidah [place] 03/31/2019, 4:26 PM

## 2019-04-01 ENCOUNTER — Telehealth (HOSPITAL_COMMUNITY): Payer: Self-pay

## 2019-04-01 NOTE — Telephone Encounter (Signed)
Relayed message to pt, appreciative.

## 2019-04-05 ENCOUNTER — Ambulatory Visit (HOSPITAL_COMMUNITY)
Admission: RE | Admit: 2019-04-05 | Discharge: 2019-04-05 | Disposition: A | Discharge status: Home / Self Care | Payer: 59 | Source: Ambulatory Visit | Attending: Cardiology | Admitting: Cardiology

## 2019-04-05 ENCOUNTER — Encounter (HOSPITAL_COMMUNITY): Payer: Self-pay

## 2019-04-05 ENCOUNTER — Other Ambulatory Visit (HOSPITAL_COMMUNITY): Payer: 59

## 2019-04-05 ENCOUNTER — Other Ambulatory Visit: Payer: Self-pay

## 2019-04-05 DIAGNOSIS — I480 Paroxysmal atrial fibrillation: Secondary | ICD-10-CM

## 2019-04-05 DIAGNOSIS — I5042 Chronic combined systolic (congestive) and diastolic (congestive) heart failure: Secondary | ICD-10-CM | POA: Diagnosis not present

## 2019-04-05 MED ORDER — FUROSEMIDE 20 MG PO TABS: 20.0000 mg | ORAL_TABLET | Freq: Every day | ORAL | 1 refills | Status: DC

## 2019-04-05 NOTE — Progress Notes (Signed)
Heart Failure TeleHealth Note  Due to national recommendations of social distancing due to [place] 19, telehealth visit is felt to be most appropriate for this patient at this time.  I discussed the limitations, risks, security and privacy concerns of performing an evaluation and management service by telephone and the availability of in person appointments. I also discussed with the patient that there may be a patient responsible charge related to this service. The patient expressed understanding and agreed to proceed.   ID:  [name] [name], DOB 1986-05-16, MRN [id number]  Location: Home  Provider location: [address], [place] Type of Visit: Established patient   PCP:  Fanny Bien, MD  Cardiologist: Dr Aundra Dubin  Chief Complaint: Heart Failure   History of Present Illness: [name] [name] is a 33 y.o. female with a history of pregnancy induced HTN, systolic HF with EF 51% on cMRI (12/2018), acute PE 12/2018 now on Eliquis, possible LV thrombus, and PAF on Eliquis.   Admitted 12/17/18 with 30 lb weight gain. CTA showed RLL PE with no obvious right heart strain. She was started on heparin and transitioned to Eliquis prior to discharge. Echo showed EF 20-25% with moderately reduced RV systolic function. HF team consulted. She was diuresed with lasix drip and metolazone. Coronary CT showed no CAD. HF medications were optimized. cMRI showed EF 16% with possible LV thrombus, RV EF 21%, and LGE pattern that could be consistent with myocarditis. Co-ox was monitored and remained normal. She had an episode of atrial fibrillation that converted with IV amiodarone. She was sent home on amiodarone but is now off it.    Repeat echo in 6/20 showed EF up to 60-65% with moderate LVH and normal RV size and systolic function.   Patient presents via video conferencing for a telehealth visit today. She has been doing well recently.  Working from home.  No significant exertional dyspnea.  No chest  pain.  No palpitations or lightheadedness.  No peripheral edema.  No exercise limitations.  Sleep study in 6/20 showed no evidence for OSA.   Pt denies symptoms of cough, fevers, chills, or new SOB worrisome for COVID 19.    PMH: 1. Pregnancy-induced HTN 2. Pulmonary embolus: RLL, 3/20.  3. Atrial fibrillation: Paroxysmal.  Episode noted during 3/20 hospitalization.  4. Chronic systolic CHF: Nonischemic cardiomyopathy.  - Echo (3/20) with EF 20-25%, moderately decreased RV systolic function.  - Cardiac MRI (3/20): EF 16% with possible LV thrombus, RV EF 21%, and LGE pattern that could be consistent with myocarditis. - Coronary CTA (3/20): No CAD - Echo (6/20): EF 60-65%, moderate LVH, normal RV size and systolic function 5. Sleep study 6/20 showed no OSA.   Past Surgical History:  Procedure Laterality Date   BIRTH CONTROL IMPLANT Left 03/2016   "Nexplanon in my upper arm"   CESAREAN SECTION N/A 02/25/2016   Procedure: CESAREAN SECTION;  Surgeon: Truett Mainland, DO;  Location: [place];  Service: Obstetrics;  Laterality: N/A;     Current Outpatient Medications  Medication Sig Dispense Refill   acetaminophen (TYLENOL) 325 MG tablet Take 2 tablets (650 mg total) by mouth every 6 (six) hours as needed for mild pain, fever or headache.     apixaban (ELIQUIS) 5 MG TABS tablet Take 1 tablet (5 mg total) by mouth 2 (two) times daily. 60 tablet 6   carvedilol (COREG) 12.5 MG tablet Take 1 tablet (12.5 mg total) by mouth 2 (two) times daily with a  meal. 90 tablet 3   ferrous sulfate 325 (65 FE) MG tablet Take 1 tablet (325 mg total) by mouth daily with breakfast. 30 tablet 0   furosemide (LASIX) 20 MG tablet Take 1 tablet (20 mg total) by mouth daily. If no increased shortness of breath or leg swelling, can then stop Lasix and use it as needed. 90 tablet 1   isosorbide-hydrALAZINE (BIDIL) 20-37.5 MG tablet Take 2 tablets by mouth 3 (three) times daily. 180 tablet 6    sacubitril-valsartan (ENTRESTO) 97-103 MG Take 1 tablet by mouth 2 (two) times daily. 60 tablet 11   spironolactone (ALDACTONE) 25 MG tablet Take 1 tablet (25 mg total) by mouth at bedtime. 30 tablet 6   No current facility-administered medications for this encounter.     Allergies:   Patient has no known allergies.   Social History:  The patient  reports that she has never smoked. She has never used smokeless tobacco. She reports that she does not drink alcohol or use drugs.   Family History:  The patient's family history includes Heart disease in her mother; Hypertension in her mother.   ROS:  Please see the history of present illness.   All other systems are personally reviewed and negative.    There were no vitals filed for this visit.  Exam: Tele Health Call; Exam is subjective  General:  Speaks in full sentences. No resp difficulty. Lungs: Normal respiratory effort with conversation.  Neck: No JVD Abdomen: No distension per patient report Extremities: Pt denies edema. Neuro: Alert & oriented x 3.   Recent Labs: 12/16/2018: NT-Pro BNP 3,613 12/18/2018: TSH 1.236 12/19/2018: B Natriuretic Peptide 192.4 12/26/2018: Magnesium 1.9 12/27/2018: ALT 23; Hemoglobin 9.3; Platelets 386 03/31/2019: BUN 19; Creatinine, Ser 1.14; Potassium 3.7; Sodium 140  Personally reviewed   Wt Readings from Last 3 Encounters:  03/25/19 94.6 kg (208 lb 9.6 oz)  03/19/19 93 kg (205 lb)  03/02/19 91.6 kg (202 lb)      ASSESSMENT AND PLAN:  1. Chronic systolic CHF: Nonischemic cardiomyopathy. EF was reportedly low on echo at hospital in [facility] in 12/19. Echo in 3/20 showed EF 20-25% with moderate RV systolic dysfunction. There had been no recent pregnancy so doubt peri-partum CMP. No family history of CHF and coronary CTA showed no CAD. Cardiac MRI showed LV EF 16% with possible small LV thrombus, RV EF 21%. Delayed enhancement imaging showed non-coronary pattern LGE that could be consistent with  myocarditis.  Repeat echo with medical therapy in 6/20 showed improvement in EF to 60-65%.  Currently, NYHA class I symptoms and not volume overloaded.  - She can decrease Lasix to 20 mg daily and stop it if no weight gain or lower extremity swelling after decreasing Lasix.  - Continue current Bidil, Coreg, Entresto, and spironolactone for now.  EF has recovered, but there is no good data to say that it would be safe to stop her cardiomyopathy meds.  Therefore, as she is not having side effects, I will have her continue for now.  - Again discussed the importance of birth control while on HF medications. She understands this and does not want to become pregnant at this time.  If she decides to try, she will let us know in order to have her medications adjusted.  - Recent BMET showed stable creatinine and K, repeat BMET in 3 months.  2. Pulmonary embolus:Occurred in 3/20.  May have been triggered by low cardiac output/stasis of flow.  She has no  family history of VTE.  - Continue Eliquis 5 mg twice a day.   3. Atrial fibrillation: Paroxysmal.Likely triggered by CHF and PE. No palpitations.  Now off amiodarone.  - Continue Eliquis 5 mg BID.  4. Possible small LV thrombus: Not seen on repeat echo in 6/20.  She remains on Eliquis.    COVID screen The patient does not have any symptoms that suggest any further testing/ screening at this time.  Social distancing reinforced today.  Patient Risk: After full review of this patients clinical status, I feel that they are at moderate risk for cardiac decompensation at this time.  Followup in 6 months.   Today, I have spent 18 minutes with the patient with telehealth technology discussing the above.   Signed, Loralie Champagne, MD  04/05/2019  [facility] [address] Heart and [place] [address] [phone] (office) [phone] (fax)

## 2019-04-08 ENCOUNTER — Telehealth (HOSPITAL_COMMUNITY): Payer: Self-pay

## 2019-04-08 NOTE — Telephone Encounter (Signed)
No response from pt, closed referral. °

## 2019-04-28 DIAGNOSIS — Z0100 Encounter for examination of eyes and vision without abnormal findings: Secondary | ICD-10-CM | POA: Diagnosis not present

## 2019-04-29 ENCOUNTER — Telehealth (HOSPITAL_COMMUNITY): Payer: Self-pay

## 2019-04-29 NOTE — Telephone Encounter (Signed)
Clearance signed and faxed to [facility]. Confirmation received.

## 2019-05-23 ENCOUNTER — Encounter (HOSPITAL_COMMUNITY): Payer: 59 | Admitting: Cardiology

## 2019-05-30 ENCOUNTER — Encounter (HOSPITAL_COMMUNITY): Payer: Self-pay

## 2019-05-31 ENCOUNTER — Other Ambulatory Visit (HOSPITAL_COMMUNITY): Payer: Self-pay | Admitting: *Deleted

## 2019-05-31 MED ORDER — CARVEDILOL 12.5 MG PO TABS: 12.5000 mg | ORAL_TABLET | Freq: Two times a day (BID) | ORAL | 3 refills | Status: DC

## 2019-07-04 ENCOUNTER — Other Ambulatory Visit (HOSPITAL_COMMUNITY): Payer: 59

## 2019-07-15 ENCOUNTER — Encounter (HOSPITAL_COMMUNITY): Payer: Self-pay

## 2019-07-15 ENCOUNTER — Other Ambulatory Visit: Payer: Self-pay

## 2019-07-15 ENCOUNTER — Ambulatory Visit (HOSPITAL_COMMUNITY)
Admission: RE | Admit: 2019-07-15 | Discharge: 2019-07-15 | Disposition: A | Discharge status: Home / Self Care | Payer: 59 | Source: Ambulatory Visit | Attending: Cardiology | Admitting: Cardiology

## 2019-07-15 DIAGNOSIS — I5042 Chronic combined systolic (congestive) and diastolic (congestive) heart failure: Secondary | ICD-10-CM | POA: Insufficient documentation

## 2019-07-15 LAB — BASIC METABOLIC PANEL
Anion gap: 10 (ref 5–15)
BUN: 13 mg/dL (ref 6–20)
CO2: 24 mmol/L (ref 22–32)
Calcium: 9.3 mg/dL (ref 8.9–10.3)
Chloride: 103 mmol/L (ref 98–111)
Creatinine, Ser: 1.06 mg/dL — ABNORMAL HIGH (ref 0.44–1.00)
GFR calc Af Amer: >60 mL/min (ref >60)
GFR calc non Af Amer: >60 mL/min (ref >60)
Glucose, Bld: 86 mg/dL (ref 70–99)
Potassium: 4 mmol/L (ref 3.5–5.1)
Sodium: 137 mmol/L (ref 135–145)

## 2019-07-18 ENCOUNTER — Telehealth (HOSPITAL_COMMUNITY): Payer: Self-pay | Admitting: *Deleted

## 2019-07-18 NOTE — Telephone Encounter (Signed)
Called patient back to discuss her mychart message no answer/left VM.      I have a question about BASIC METABOLIC PANEL resulted on 07/15/19, 1:36 PM.  Should I worry about creatinine level? What does it mean 1.06?

## 2020-02-09 DIAGNOSIS — Z20828 Contact with and (suspected) exposure to other viral communicable diseases: Secondary | ICD-10-CM | POA: Diagnosis not present

## 2020-02-09 DIAGNOSIS — Z20822 Contact with and (suspected) exposure to covid-19: Secondary | ICD-10-CM | POA: Diagnosis not present

## 2020-02-11 DIAGNOSIS — Z20822 Contact with and (suspected) exposure to covid-19: Secondary | ICD-10-CM | POA: Diagnosis not present

## 2020-02-11 DIAGNOSIS — Z209 Contact with and (suspected) exposure to unspecified communicable disease: Secondary | ICD-10-CM | POA: Diagnosis not present

## 2020-03-13 ENCOUNTER — Encounter (HOSPITAL_COMMUNITY): Payer: Self-pay

## 2020-03-20 ENCOUNTER — Inpatient Hospital Stay (HOSPITAL_COMMUNITY): Admission: RE | Admit: 2020-03-20 | Payer: 59 | Source: Ambulatory Visit

## 2020-03-21 ENCOUNTER — Ambulatory Visit (HOSPITAL_COMMUNITY)
Admission: RE | Admit: 2020-03-21 | Discharge: 2020-03-21 | Disposition: A | Discharge status: Home / Self Care | Payer: 59 | Source: Ambulatory Visit | Attending: Cardiology | Admitting: Cardiology

## 2020-03-21 ENCOUNTER — Encounter (HOSPITAL_COMMUNITY): Payer: Self-pay

## 2020-03-21 ENCOUNTER — Other Ambulatory Visit: Payer: Self-pay

## 2020-03-21 VITALS — BP 202/150 | HR 70 | Wt 233.2 lb

## 2020-03-21 DIAGNOSIS — Z3A00 Weeks of gestation of pregnancy not specified: Secondary | ICD-10-CM | POA: Insufficient documentation

## 2020-03-21 DIAGNOSIS — I5042 Chronic combined systolic (congestive) and diastolic (congestive) heart failure: Secondary | ICD-10-CM

## 2020-03-21 DIAGNOSIS — O169 Unspecified maternal hypertension, unspecified trimester: Secondary | ICD-10-CM | POA: Insufficient documentation

## 2020-03-21 DIAGNOSIS — O26899 Other specified pregnancy related conditions, unspecified trimester: Secondary | ICD-10-CM | POA: Insufficient documentation

## 2020-03-21 DIAGNOSIS — Z79899 Other long term (current) drug therapy: Secondary | ICD-10-CM | POA: Insufficient documentation

## 2020-03-21 DIAGNOSIS — Z86711 Personal history of pulmonary embolism: Secondary | ICD-10-CM | POA: Diagnosis not present

## 2020-03-21 DIAGNOSIS — I5022 Chronic systolic (congestive) heart failure: Secondary | ICD-10-CM | POA: Insufficient documentation

## 2020-03-21 DIAGNOSIS — I10 Essential (primary) hypertension: Secondary | ICD-10-CM

## 2020-03-21 DIAGNOSIS — I480 Paroxysmal atrial fibrillation: Secondary | ICD-10-CM | POA: Diagnosis not present

## 2020-03-21 LAB — BASIC METABOLIC PANEL
Anion gap: 9 (ref 5–15)
BUN: 10 mg/dL (ref 6–20)
CO2: 26 mmol/L (ref 22–32)
Calcium: 9.2 mg/dL (ref 8.9–10.3)
Chloride: 104 mmol/L (ref 98–111)
Creatinine, Ser: 0.88 mg/dL (ref 0.44–1.00)
GFR calc Af Amer: >60 mL/min (ref >60)
GFR calc non Af Amer: >60 mL/min (ref >60)
Glucose, Bld: 91 mg/dL (ref 70–99)
Potassium: 3.4 mmol/L — ABNORMAL LOW (ref 3.5–5.1)
Sodium: 139 mmol/L (ref 135–145)

## 2020-03-21 MED ORDER — CARVEDILOL 12.5 MG PO TABS: 25.0000 mg | ORAL_TABLET | Freq: Two times a day (BID) | ORAL | 3 refills | Status: DC

## 2020-03-21 MED ORDER — CLONIDINE HCL 0.1 MG PO TABS
0.1000 mg | ORAL_TABLET | ORAL | Status: DC
  Filled 2020-03-21: qty 1

## 2020-03-21 MED ORDER — FUROSEMIDE 20 MG PO TABS: 20.0000 mg | ORAL_TABLET | Freq: Every day | ORAL | 1 refills | Status: DC

## 2020-03-21 MED ORDER — POTASSIUM CHLORIDE CRYS ER 20 MEQ PO TBCR: 20.0000 meq | EXTENDED_RELEASE_TABLET | Freq: Every day | ORAL | 1 refills | Status: DC

## 2020-03-21 NOTE — Patient Instructions (Signed)
It was a pleasure seeing you today!  MEDICATIONS: -We are changing your medications today -Restart carvedilol 25 mg (1 tablet twice a day with meals) -Restart Bidil 20-37.5 mg (2 tablets three times a day) -Restart furosemide 20 mg daily -Call if you have questions about your medications.  LABS: -We will call you if your labs need attention.  NEXT APPOINTMENT: Return to clinic in 1 week  In general, to take care of your heart failure: -Limit your fluid intake to 2 Liters (half-gallon) per day.   -Limit your salt intake to ideally 2-3 grams (2000-3000 mg) per day. -Weigh yourself daily and record, and bring that "weight diary" to your next appointment.  (Weight gain of 2-3 pounds in 1 day typically means fluid weight.) -The medications for your heart are to help your heart and help you live longer.   -Please contact us before stopping any of your heart medications.  Call the clinic at [phone] with questions or to reschedule future appointments.

## 2020-03-21 NOTE — Progress Notes (Signed)
HPI:  [name] Williamsis a 34 y.o.femalewith a history of pregnancy induced HTN, systolic HF with EF 29% on cMRI (12/2018) which has improved to 60-65% on medical therapy, acute PE 12/2018, possible LV thrombus, and paroxysmal atrial fibrillation.  Admitted 12/17/18 with 30 lb weight gain. CTA showed RLL PE with no obvious right heart strain. She was started on heparin and transitioned to Eliquis prior todischarge. Echo showed EF 20-25% with moderately reduced RVsystolic function. She underwent diuresis with IV furosemide and metolazone. HF medications were optimized. Coronary CT showed no CAD. cMRI showed EF 16% with possible LV thrombus, RV EF 21%, and LGE pattern that could be consistent with myocarditis. Co-ox was monitored and remained normal. She had an episode of atrial fibrillation that converted with IV amiodarone.She was sent home on amiodarone but is now off it.   Repeat echo in 6/20 showed EF up to 60-65% with moderate LVH and normal RV size and systolic function.   Patient's previous visit with Dr. Aundra Dubin was 04/05/2019 where she was felt to be stable with NYHA class I symptoms with no volume overload. Furosemide was decreased to 20 mg daily with instructions to stop if no weight gain or lower extremity swelling.  Dr. Aundra Dubin recently received a message from the patient on 03/13/20 that she is now pregnant and needs her medications reviewed for safety. She was instructed by Dr. Aundra Dubin to stop Entresto, spironolactone and Eliquis. Decision was made to stay off blood thinner for now given that her heart function has improved.  Today she returns to HF clinic to review medications for safety with new pregnancy and address blood pressure. She was instructed to stop Entresto, spironolactone and Eliquis by Dr. Aundra Dubin, however patient reports stopping all medications when she found out she was pregnant (around middle of May). Overall, she is feeling tired and going to bed earlier than usual. She  reports no dizziness, lightheadedness, chest pain or palpitations. Patient denies PND/Orthopnea but she experiences a bit more shortness of breath with prolonged activity. 1+ bilateral lower extremity edema present. Patient's weight has increased from 208 lbs to 233 lbs  from 1 year ago. Patient's weight gain has been attributed to poor diet, volume increase from noncompliance to furosemide, and pregnancy. She reports not being able to control diet since the holidays. Eats snacks, chips, candy, cookies, crackers, ICE beverages. Patient checks blood pressure at home. She reports that it has been increasing and her numbers are consistently 180s/100s.  HF Medications (prescribed): Carvedilol 12.5 mg BID - not taking BiDil 20-37.5 2 tablets TID - not taking Furosemide 20 mg daily - not taking Note: Entresto 97/103 and spironolactone 25 mg daily were stopped due to recent pregnancy  Patient reports that she has not taken any of her medications since she found out that she was pregnant.   Understanding of regimen: fair Understanding of indications: fair Potential of compliance: poor Patient understands to avoid NSAIDs. Patient understands to avoid decongestants.    Pertinent Lab Values:  Serum creatinine 0.88, BUN 10, Potassium 3.4, Sodium 139  Vital Signs:  Weight: 233 (last clinic weight: 208)  Blood pressure: 202/150, repeat after clonidine administered 184/140  Heart rate: 70  Assessment/Plan: 1. Pregnancy induced HTN: Patients blood pressure is significantly elevated at 202/150. No complaints of headache, dizziness, slurred speech, chest pain, blurred vision. - Gave PO clonidine 0.1 mg x 1 in clinic Repeat blood pressure decreased to 184/140 after clonidine. Patient instructed to resume furosemide, carvedilol, and BiDil immediately. - Consider nifedipine  ER 30 mg daily at next visit if blood pressure remains elevated. Nifedipine is a drug of choice for HTN in pregnancy. 2. Chronic  systolic CHF: EF low on echo at hospital in [facility] in 12/19, repeatEchoin 3/20 showedEF 20-25% with moderate RV systolic dysfunction. No family history of CHF and coronary CTA showed no CAD. Cardiac MRI showed LV EF 16% with possible small LV thrombus, RV EF 21%. Delayed enhancement imaging showed non-coronary pattern LGE that could be consistent with myocarditis. Repeat echo with medical therapy in 6/20 showed improvement in EF to 60-65%.  - New pregnancy noted 03/2020 - Currently, NYHA class I symptoms and not significantly overloaded but slight increase in edema.  -Labs: Scr 0.88, K low at 3.4. Start potassium chloride 20 mEq daily. Repeat BMET in 1 week.  - Restart furosemide 20 mg daily - Restart and increase carvedilol to 25 mg BID. Although b-1 selective beta-blockers like metoprolol succinate and bisoprolol are generally preferred in pregnancy as they avoid interfering with b-2 mediated uterine relaxation, carvedilol is considered safe to use as it has not been associated with fetal growth restriction. Will continue carvedilol for now but can consider change to metoprolol succinate if fetal growth restriction is noted.  - Restart Bidil 20-37.5 2 tablets TID - Entresto 97/103 and spironolactone have been discontinued with new pregnancy as they are teratogenic.  -EF has recovered, but there is no good data to say that it would be safe to stop her cardiomyopathy meds. Therefore, as she is not having side effects, will have her continue for now.  - Educated patient on importance of adhering to a low-salt diet and moderate fluid restriction. Patient agreed to decrease at least one soda and a snack daily 3. Pulmonary embolus:Occurred in 3/20. May have been triggered by low cardiac output/stasis of flow. She has no family history of VTE.  -Off Eliquis for now given recent pregnancy and improved heart function  4. Atrial fibrillation: Paroxysmal.Likely triggered by CHF and PE.No  palpitations. Now off amiodarone.  -Off Eliquis as above 5. Possible small LV thrombus:Not seen on repeat echo in 6/20.  -Off Eliquis as above   Summary: 1) Medication changes: Based on clinical presentation, vital signs and recent labs will  -Restart carvedilol 25 mg BID -Restart BiDil 20-37.5 mg (2 tablets TID) -Restart furosemide 20 mg daily -Start potassium chloride 20 mEq daily 2) Follow-up in 1 week with heart failure pharmacy clinic to recheck blood pressure and make medication changes as needed. See Dr. Aundra Dubin or APP clinic in 6 weeks.   Sherren Kerns, PharmD PGY1 Acute Care Pharmacy Resident  Audry Riles, PharmD, BCPS, [facility], CPP Heart Failure Clinic Pharmacist [phone]

## 2020-03-22 ENCOUNTER — Telehealth (HOSPITAL_COMMUNITY): Payer: Self-pay | Admitting: Vascular Surgery

## 2020-03-22 NOTE — Telephone Encounter (Signed)
Left pt message giving 4-6 week f/u. Asked pt to call back to confirm

## 2020-03-27 ENCOUNTER — Inpatient Hospital Stay (HOSPITAL_COMMUNITY): Admission: RE | Admit: 2020-03-27 | Payer: 59 | Source: Ambulatory Visit

## 2020-03-29 ENCOUNTER — Other Ambulatory Visit (HOSPITAL_COMMUNITY): Payer: Self-pay | Admitting: *Deleted

## 2020-03-29 ENCOUNTER — Telehealth (HOSPITAL_COMMUNITY): Payer: Self-pay | Admitting: Pharmacist

## 2020-03-29 ENCOUNTER — Encounter (HOSPITAL_COMMUNITY): Payer: Self-pay

## 2020-03-29 MED ORDER — ISOSORB DINITRATE-HYDRALAZINE 20-37.5 MG PO TABS: 2.0000 | ORAL_TABLET | Freq: Three times a day (TID) | ORAL | 6 refills | Status: DC

## 2020-03-29 NOTE — Telephone Encounter (Signed)
Encounter entered in error.

## 2020-04-04 ENCOUNTER — Inpatient Hospital Stay (HOSPITAL_COMMUNITY): Admission: RE | Admit: 2020-04-04 | Payer: 59 | Source: Ambulatory Visit

## 2020-04-11 ENCOUNTER — Other Ambulatory Visit: Payer: Self-pay

## 2020-04-11 ENCOUNTER — Ambulatory Visit (HOSPITAL_COMMUNITY): Payer: 59 | Admitting: Certified Registered Nurse Anesthetist

## 2020-04-11 ENCOUNTER — Encounter (HOSPITAL_COMMUNITY)
Admission: AD | Disposition: A | Discharge status: Home / Self Care | Payer: Self-pay | Source: Home / Self Care | Attending: Obstetrics and Gynecology

## 2020-04-11 ENCOUNTER — Ambulatory Visit (HOSPITAL_COMMUNITY)
Admission: AD | Admit: 2020-04-11 | Discharge: 2020-04-11 | Disposition: A | Discharge status: Home / Self Care | Payer: 59 | Attending: Obstetrics and Gynecology | Admitting: Obstetrics and Gynecology

## 2020-04-11 ENCOUNTER — Encounter (HOSPITAL_COMMUNITY): Payer: Self-pay | Admitting: Obstetrics and Gynecology

## 2020-04-11 DIAGNOSIS — Z79899 Other long term (current) drug therapy: Secondary | ICD-10-CM | POA: Diagnosis not present

## 2020-04-11 DIAGNOSIS — I110 Hypertensive heart disease with heart failure: Secondary | ICD-10-CM | POA: Insufficient documentation

## 2020-04-11 DIAGNOSIS — Z6841 Body Mass Index (BMI) 40.0 and over, adult: Secondary | ICD-10-CM | POA: Insufficient documentation

## 2020-04-11 DIAGNOSIS — K660 Peritoneal adhesions (postprocedural) (postinfection): Secondary | ICD-10-CM | POA: Diagnosis not present

## 2020-04-11 DIAGNOSIS — Z20822 Contact with and (suspected) exposure to covid-19: Secondary | ICD-10-CM | POA: Diagnosis not present

## 2020-04-11 DIAGNOSIS — O009 Unspecified ectopic pregnancy without intrauterine pregnancy: Secondary | ICD-10-CM | POA: Diagnosis not present

## 2020-04-11 DIAGNOSIS — I5042 Chronic combined systolic (congestive) and diastolic (congestive) heart failure: Secondary | ICD-10-CM | POA: Diagnosis not present

## 2020-04-11 DIAGNOSIS — K661 Hemoperitoneum: Secondary | ICD-10-CM | POA: Diagnosis not present

## 2020-04-11 DIAGNOSIS — N926 Irregular menstruation, unspecified: Secondary | ICD-10-CM | POA: Diagnosis not present

## 2020-04-11 DIAGNOSIS — Z86711 Personal history of pulmonary embolism: Secondary | ICD-10-CM | POA: Insufficient documentation

## 2020-04-11 DIAGNOSIS — Z793 Long term (current) use of hormonal contraceptives: Secondary | ICD-10-CM | POA: Diagnosis not present

## 2020-04-11 DIAGNOSIS — O00102 Left tubal pregnancy without intrauterine pregnancy: Secondary | ICD-10-CM | POA: Diagnosis not present

## 2020-04-11 DIAGNOSIS — I428 Other cardiomyopathies: Secondary | ICD-10-CM | POA: Insufficient documentation

## 2020-04-11 DIAGNOSIS — Z3689 Encounter for other specified antenatal screening: Secondary | ICD-10-CM | POA: Diagnosis not present

## 2020-04-11 HISTORY — DX: Cardiac arrhythmia, unspecified: I49.9

## 2020-04-11 HISTORY — PX: LAPAROSCOPIC SALPINGO OOPHERECTOMY: SHX5927

## 2020-04-11 LAB — ABO/RH: ABO/RH(D): O POS

## 2020-04-11 LAB — BASIC METABOLIC PANEL
Anion gap: 9 (ref 5–15)
BUN: 12 mg/dL (ref 6–20)
CO2: 24 mmol/L (ref 22–32)
Calcium: 9.9 mg/dL (ref 8.9–10.3)
Chloride: 102 mmol/L (ref 98–111)
Creatinine, Ser: 1.06 mg/dL — ABNORMAL HIGH (ref 0.44–1.00)
GFR calc Af Amer: >60 mL/min (ref >60)
GFR calc non Af Amer: >60 mL/min (ref >60)
Glucose, Bld: 117 mg/dL — ABNORMAL HIGH (ref 70–99)
Potassium: 4.1 mmol/L (ref 3.5–5.1)
Sodium: 135 mmol/L (ref 135–145)

## 2020-04-11 LAB — CBC
HCT: 38.5 % (ref 36.0–46.0)
Hemoglobin: 12.3 g/dL (ref 12.0–15.0)
MCH: 27.3 pg (ref 26.0–34.0)
MCHC: 31.9 g/dL (ref 30.0–36.0)
MCV: 85.4 fL (ref 80.0–100.0)
Platelets: 261 10*3/uL (ref 150–400)
RBC: 4.51 MIL/uL (ref 3.87–5.11)
RDW: 13.3 % (ref 11.5–15.5)
WBC: 10.6 10*3/uL — ABNORMAL HIGH (ref 4.0–10.5)
nRBC: 0 % (ref 0.0–0.2)

## 2020-04-11 LAB — SARS CORONAVIRUS 2 BY RT PCR (HOSPITAL ORDER, PERFORMED IN ~~LOC~~ HOSPITAL LAB): SARS Coronavirus 2: NEGATIVE

## 2020-04-11 LAB — TYPE AND SCREEN
ABO/RH(D): O POS
Antibody Screen: NEGATIVE

## 2020-04-11 SURGERY — SALPINGO-OOPHORECTOMY, LAPAROSCOPIC
Anesthesia: General | Laterality: Left

## 2020-04-11 MED ORDER — PROPOFOL 10 MG/ML IV BOLUS
INTRAVENOUS | Status: DC | PRN
  Administered 2020-04-11: 200 mg via INTRAVENOUS

## 2020-04-11 MED ORDER — ROCURONIUM BROMIDE 10 MG/ML (PF) SYRINGE
PREFILLED_SYRINGE | INTRAVENOUS | Status: DC | PRN
  Administered 2020-04-11: 40 mg via INTRAVENOUS

## 2020-04-11 MED ORDER — CELECOXIB 200 MG PO CAPS: 200.0000 mg | ORAL_CAPSULE | Freq: Once | ORAL | Status: DC

## 2020-04-11 MED ORDER — DEXAMETHASONE SODIUM PHOSPHATE 10 MG/ML IJ SOLN
INTRAMUSCULAR | Status: AC
  Filled 2020-04-11: qty 1

## 2020-04-11 MED ORDER — OXYCODONE-ACETAMINOPHEN 5-325 MG PO TABS
ORAL_TABLET | ORAL | Status: AC
  Filled 2020-04-11: qty 1

## 2020-04-11 MED ORDER — OXYCODONE-ACETAMINOPHEN 5-325 MG PO TABS
1.0000 | ORAL_TABLET | Freq: Once | ORAL | Status: AC
  Administered 2020-04-11: 1 via ORAL

## 2020-04-11 MED ORDER — ACETAMINOPHEN 500 MG PO TABS: 1000.0000 mg | ORAL_TABLET | Freq: Once | ORAL | Status: DC

## 2020-04-11 MED ORDER — DIPHENHYDRAMINE HCL 50 MG/ML IJ SOLN
INTRAMUSCULAR | Status: AC
  Filled 2020-04-11: qty 1

## 2020-04-11 MED ORDER — ALBUMIN HUMAN 5 % IV SOLN: INTRAVENOUS | Status: DC | PRN

## 2020-04-11 MED ORDER — PROPOFOL 10 MG/ML IV BOLUS
INTRAVENOUS | Status: AC
  Filled 2020-04-11: qty 20

## 2020-04-11 MED ORDER — HYDROMORPHONE HCL 1 MG/ML IJ SOLN: 0.2500 mg | INTRAMUSCULAR | Status: DC | PRN

## 2020-04-11 MED ORDER — ROCURONIUM BROMIDE 10 MG/ML (PF) SYRINGE
PREFILLED_SYRINGE | INTRAVENOUS | Status: AC
  Filled 2020-04-11: qty 10

## 2020-04-11 MED ORDER — DEXAMETHASONE SODIUM PHOSPHATE 10 MG/ML IJ SOLN
INTRAMUSCULAR | Status: DC | PRN
  Administered 2020-04-11: 5 mg via INTRAVENOUS

## 2020-04-11 MED ORDER — SUGAMMADEX SODIUM 200 MG/2ML IV SOLN
INTRAVENOUS | Status: DC | PRN
  Administered 2020-04-11: 200 mg via INTRAVENOUS

## 2020-04-11 MED ORDER — MIDAZOLAM HCL 2 MG/2ML IJ SOLN
INTRAMUSCULAR | Status: DC | PRN
  Administered 2020-04-11: 2 mg via INTRAVENOUS

## 2020-04-11 MED ORDER — SUCCINYLCHOLINE CHLORIDE 200 MG/10ML IV SOSY
PREFILLED_SYRINGE | INTRAVENOUS | Status: AC
  Filled 2020-04-11: qty 10

## 2020-04-11 MED ORDER — FENTANYL CITRATE (PF) 250 MCG/5ML IJ SOLN
INTRAMUSCULAR | Status: AC
  Filled 2020-04-11: qty 5

## 2020-04-11 MED ORDER — PHENYLEPHRINE 40 MCG/ML (10ML) SYRINGE FOR IV PUSH (FOR BLOOD PRESSURE SUPPORT)
PREFILLED_SYRINGE | INTRAVENOUS | Status: AC
  Filled 2020-04-11: qty 10

## 2020-04-11 MED ORDER — LACTATED RINGERS IV SOLN: INTRAVENOUS | Status: DC

## 2020-04-11 MED ORDER — BUPIVACAINE HCL (PF) 0.25 % IJ SOLN
INTRAMUSCULAR | Status: AC
  Filled 2020-04-11: qty 30

## 2020-04-11 MED ORDER — MIDAZOLAM HCL 2 MG/2ML IJ SOLN
INTRAMUSCULAR | Status: AC
  Filled 2020-04-11: qty 2

## 2020-04-11 MED ORDER — LIDOCAINE 2% (20 MG/ML) 5 ML SYRINGE
INTRAMUSCULAR | Status: DC | PRN
  Administered 2020-04-11: 60 mg via INTRAVENOUS

## 2020-04-11 MED ORDER — SCOPOLAMINE 1 MG/3DAYS TD PT72
MEDICATED_PATCH | TRANSDERMAL | Status: DC | PRN
  Administered 2020-04-11: 1 via TRANSDERMAL

## 2020-04-11 MED ORDER — PHENYLEPHRINE 40 MCG/ML (10ML) SYRINGE FOR IV PUSH (FOR BLOOD PRESSURE SUPPORT)
PREFILLED_SYRINGE | INTRAVENOUS | Status: DC | PRN
  Administered 2020-04-11 (×3): 80 ug via INTRAVENOUS

## 2020-04-11 MED ORDER — BUPIVACAINE HCL (PF) 0.25 % IJ SOLN
INTRAMUSCULAR | Status: DC | PRN
  Administered 2020-04-11: 10 mL

## 2020-04-11 MED ORDER — FENTANYL CITRATE (PF) 250 MCG/5ML IJ SOLN
INTRAMUSCULAR | Status: DC | PRN
  Administered 2020-04-11: 100 ug via INTRAVENOUS
  Administered 2020-04-11: 50 ug via INTRAVENOUS

## 2020-04-11 MED ORDER — FERROUS SULFATE 325 (65 FE) MG PO TABS: 325.0000 mg | ORAL_TABLET | Freq: Two times a day (BID) | ORAL | 6 refills | Status: DC

## 2020-04-11 MED ORDER — EPHEDRINE 5 MG/ML INJ
INTRAVENOUS | Status: AC
  Filled 2020-04-11: qty 10

## 2020-04-11 MED ORDER — ONDANSETRON HCL 4 MG/2ML IJ SOLN
INTRAMUSCULAR | Status: AC
  Filled 2020-04-11: qty 2

## 2020-04-11 MED ORDER — OXYCODONE-ACETAMINOPHEN 5-325 MG PO TABS: 1.0000 | ORAL_TABLET | Freq: Four times a day (QID) | ORAL | 0 refills | Status: AC | PRN

## 2020-04-11 MED ORDER — LIDOCAINE 2% (20 MG/ML) 5 ML SYRINGE
INTRAMUSCULAR | Status: AC
  Filled 2020-04-11: qty 5

## 2020-04-11 MED ORDER — ACETAMINOPHEN 10 MG/ML IV SOLN
INTRAVENOUS | Status: AC
  Filled 2020-04-11: qty 100

## 2020-04-11 MED ORDER — SODIUM CHLORIDE 0.9 % IR SOLN
Status: DC | PRN
  Administered 2020-04-11: 1000 mL

## 2020-04-11 MED ORDER — GLYCOPYRROLATE PF 0.2 MG/ML IJ SOSY
PREFILLED_SYRINGE | INTRAMUSCULAR | Status: AC
  Filled 2020-04-11: qty 1

## 2020-04-11 MED ORDER — ONDANSETRON HCL 4 MG/2ML IJ SOLN
INTRAMUSCULAR | Status: DC | PRN
  Administered 2020-04-11: 4 mg via INTRAVENOUS

## 2020-04-11 MED ORDER — ACETAMINOPHEN 10 MG/ML IV SOLN
INTRAVENOUS | Status: DC | PRN
Start: 2020-04-11 — End: 2020-04-11
  Administered 2020-04-11: 1000 mg via INTRAVENOUS

## 2020-04-11 SURGICAL SUPPLY — 42 items
ADH SKN CLS APL DERMABOND .7 (GAUZE/BANDAGES/DRESSINGS) ×1
APL SWBSTK 6 STRL LF DISP (MISCELLANEOUS) ×1
APPLICATOR COTTON TIP 6 STRL (MISCELLANEOUS) ×1 IMPLANT
APPLICATOR COTTON TIP 6IN STRL (MISCELLANEOUS) ×2
BAG SPEC RTRVL LRG 6X4 10 (ENDOMECHANICALS) ×1
CABLE HIGH FREQUENCY MONO STRZ (ELECTRODE) IMPLANT
CATH ROBINSON RED A/P 16FR (CATHETERS) ×1 IMPLANT
DERMABOND ADVANCED (GAUZE/BANDAGES/DRESSINGS) ×1
DERMABOND ADVANCED .7 DNX12 (GAUZE/BANDAGES/DRESSINGS) ×1 IMPLANT
DRSG OPSITE POSTOP 3X4 (GAUZE/BANDAGES/DRESSINGS) IMPLANT
FILTER SMOKE EVAC LAPAROSHD (FILTER) ×2 IMPLANT
FORCEPS CUTTING 33CM 5MM (CUTTING FORCEPS) IMPLANT
FORCEPS CUTTING 45CM 5MM (CUTTING FORCEPS) IMPLANT
GLOVE BIOGEL PI IND STRL 7.0 (GLOVE) ×3 IMPLANT
GLOVE BIOGEL PI INDICATOR 7.0 (GLOVE) ×3
GLOVE ECLIPSE 6.5 STRL STRAW (GLOVE) ×2 IMPLANT
GOWN STRL REUS W/ TWL LRG LVL3 (GOWN DISPOSABLE) ×3 IMPLANT
GOWN STRL REUS W/TWL LRG LVL3 (GOWN DISPOSABLE) ×6
KIT TURNOVER KIT B (KITS) ×2 IMPLANT
LIGASURE VESSEL 5MM BLUNT TIP (ELECTROSURGICAL) ×1 IMPLANT
NDL INSUFFLATION 14GA 120MM (NEEDLE) ×1 IMPLANT
NEEDLE INSUFFLATION 14GA 120MM (NEEDLE) ×2 IMPLANT
NS IRRIG 1000ML POUR BTL (IV SOLUTION) ×2 IMPLANT
PACK LAPAROSCOPY BASIN (CUSTOM PROCEDURE TRAY) ×2 IMPLANT
PACK TRENDGUARD 450 HYBRID PRO (MISCELLANEOUS) IMPLANT
POUCH SPECIMEN RETRIEVAL 10MM (ENDOMECHANICALS) ×1 IMPLANT
PROTECTOR NERVE ULNAR (MISCELLANEOUS) ×4 IMPLANT
SCISSORS LAP 5X35 DISP (ENDOMECHANICALS) IMPLANT
SET IRRIG TUBING LAPAROSCOPIC (IRRIGATION / IRRIGATOR) IMPLANT
SET TUBE SMOKE EVAC HIGH FLOW (TUBING) ×2 IMPLANT
SLEEVE ENDOPATH XCEL 5M (ENDOMECHANICALS) ×1 IMPLANT
SLEEVE SURGEON STRL (DRAPES) ×1 IMPLANT
SLEEVE XCEL OPT CAN 5 100 (ENDOMECHANICALS) ×1 IMPLANT
SOLUTION ELECTROLUBE (MISCELLANEOUS) IMPLANT
SUT VICRYL 0 UR6 27IN ABS (SUTURE) ×2 IMPLANT
SUT VICRYL 4-0 PS2 18IN ABS (SUTURE) ×2 IMPLANT
TOWEL GREEN STERILE FF (TOWEL DISPOSABLE) ×4 IMPLANT
TRENDGUARD 450 HYBRID PRO PACK (MISCELLANEOUS) ×2
TROCAR BALLN 12MMX100 BLUNT (TROCAR) IMPLANT
TROCAR OPTI TIP 5M 100M (ENDOMECHANICALS) ×2 IMPLANT
TROCAR XCEL DIL TIP R 11M (ENDOMECHANICALS) ×2 IMPLANT
WARMER LAPAROSCOPE (MISCELLANEOUS) ×2 IMPLANT

## 2020-04-11 NOTE — H&P (Signed)
[name] [name] is an 34 y.o. female. BF pt of [name] [name] seen in the office today for vaginal pain and was found to have left ectopic pregnancy with hemoperitoneum. Pt had Hquant 3733. PMH notable for chronic HTN with cardiac.   Pertinent Gynecological History: Menses: regular every month without intermenstrual spotting Bleeding: dysfunctional uterine bleeding Contraception: none DES exposure: denies Blood transfusions: none Sexually transmitted diseases: no past history Previous GYN Procedures: c/s  Last mammogram: n/[name] Date: n/[name] Last pap: normal Date: 2021 OB History: G2P1001   Menstrual History: Menarche age: n/[name] No LMP recorded (lmp unknown). Patient is pregnant.    Past Medical History:  Diagnosis Date  . Combined systolic and diastolic CHF    EF 16 by cMRI in 12/2018  . Condyloma acuminata   . Dysrhythmia    [name]-fib  . HTN in pregnancy, chronic 09/24/2015   [place] labs: [x ]Cr, [x]  24hr protein (208) P:Cr 92 Plat: 207 AST/ALT: 10/10    . Hx of RLL Pulmonary Embolism 12/2018 12/17/2018  . Hypertension   . LV (left ventricular) mural thrombus 02/03/2019  . Nonischemic cardiomyopathy 02/03/2019   Echo 12/2018: EF 20-25, Gr 2 DD // cMRI 12/2018:  EF 16, poss LV clot // Cor CTA 12/2018: no sig CAD  . Obesity in pregnancy    Antepartum, first trimester  . Paroxysmal atrial fibrillation ([place]) 02/03/2019   AF occurred during hosp for PE/CHF/Pneumonia 12/2018 >> Amiod continued for 1 mo post DC, then DC'd    Past Surgical History:  Procedure Laterality Date  . BIRTH CONTROL IMPLANT Left 03/2016   "Nexplanon in my upper arm"  . CESAREAN SECTION N/[name] 02/25/2016   Procedure: CESAREAN SECTION;  Surgeon: [name] Mainland, DO;  Location: [facility];  Service: Obstetrics;  Laterality: N/[name];    Family History  Problem Relation Age of Onset  . Hypertension Mother   . Heart disease Mother     Social History:  reports that she has never smoked. She has never used smokeless tobacco.  She reports that she does not drink alcohol and does not use drugs.  Allergies: No Known Allergies  Medications Prior to Admission  Medication Sig Dispense Refill Last Dose  . carvedilol (COREG) 25 MG tablet Take 25 mg by mouth 2 (two) times daily with [name] meal.   04/11/2020 at 1000  . furosemide (LASIX) 20 MG tablet Take 1 tablet (20 mg total) by mouth daily. 30 tablet 1 04/10/2020 at Unknown time  . isosorbide-hydrALAZINE (BIDIL) 20-37.5 MG tablet Take 2 tablets by mouth 3 (three) times daily. 180 tablet 6 04/11/2020 at Unknown time  . potassium chloride SA (KLOR-CON) 20 MEQ tablet Take 1 tablet (20 mEq total) by mouth daily. Take with lasix 30 tablet 1 Past Week at Unknown time  . acetaminophen (TYLENOL) 325 MG tablet Take 2 tablets (650 mg total) by mouth every 6 (six) hours as needed for mild pain, fever or headache. (Patient not taking: Reported on 03/21/2020)   Not Taking at Unknown time  . ferrous sulfate 325 (65 FE) MG tablet Take 1 tablet (325 mg total) by mouth daily with breakfast. (Patient not taking: Reported on 03/21/2020) 30 tablet 0 Not Taking at Unknown time    Review of Systems  Gastrointestinal: Positive for abdominal pain.  Genitourinary: Positive for vaginal bleeding.  All other systems reviewed and are negative.   Blood pressure 138/67, pulse 80, temperature (!) 97.5 F (36.4 C), temperature source Oral, resp. rate 20, height 5\' 3"  (1.6 m),  weight 107 kg, SpO2 100 %. Physical Exam Constitutional:      Appearance: Normal appearance.  Eyes:     Pupils: Pupils are equal, round, and reactive to light.  Cardiovascular:     Rate and Rhythm: Normal rate.  Pulmonary:     Effort: Pulmonary effort is normal.  Abdominal:     Palpations: Abdomen is soft.  Skin:    General: Skin is warm and dry.  Neurological:     Mental Status: She is alert.  Psychiatric:        Mood and Affect: Mood normal.        Behavior: Behavior normal.     Results for orders placed or performed  during the hospital encounter of 04/11/20 (from the past 24 hour(s))  SARS Coronavirus 2 by RT PCR (hospital order, performed in [facility] hospital lab) Nasopharyngeal Nasopharyngeal Swab     Status: None   Collection Time: 04/11/20  2:36 PM   Specimen: Nasopharyngeal Swab  Result Value Ref Range   SARS Coronavirus 2 NEGATIVE NEGATIVE  ABO/Rh     Status: None (Preliminary result)   Collection Time: 04/11/20  3:30 PM  Result Value Ref Range   ABO/RH(D)      O POS Performed at [facility], [place] [name], [place], [place] [address]     CBC Latest Ref Rng & Units 12/27/2018 12/26/2018 12/25/2018  WBC 4.0 - 10.5 K/uL 7.1 6.5 7.5  Hemoglobin 12.0 - 15.0 g/dL 9.3(L) 9.3(L) 9.1(L)  Hematocrit 36 - 46 % 32.0(L) 31.8(L) 32.1(L)  Platelets 150 - 400 K/uL [phone]    Assessment/Plan: Presumed left ectopic pregnancy P) dx laparoscopy, left salpingectomy. Procedure explained. Risk of surgery reviewed including infection, bleeding, loss of involved tube, injury to underlying and surrounding organ structure. All ? answered [name] [name] [name] [name] 04/11/2020, 4:01 PM

## 2020-04-11 NOTE — Transfer of Care (Signed)
Immediate Anesthesia Transfer of Care Note  Patient: [name] [name]  Procedure(s) Performed: LAPAROSCOPIC SALPINGECTOMY/Removal of Hemoperitoneum, lysis of adhesions (Left )  Patient Location: PACU  Anesthesia Type:General  Level of Consciousness: awake, alert  and oriented  Airway & Oxygen Therapy: Patient Spontanous Breathing and Patient connected to face mask oxygen  Post-op Assessment: Report given to RN and Post -op Vital signs reviewed and stable  Post vital signs: Reviewed and stable  Last Vitals:  Vitals Value Taken Time  BP 143/78 04/11/20 1835  Temp    Pulse 88   Resp 16 04/11/20 1836  SpO2 98   Vitals shown include unvalidated device data.  Last Pain:  Vitals:   04/11/20 1601  TempSrc:   PainSc: 6       Patients Stated Pain Goal: 3 (83/25/49 8264)  Complications: No complications documented.

## 2020-04-11 NOTE — Discharge Instructions (Signed)
CALL  IF TEMP>100.4, NOTHING PER VAGINA X 2 WK, CALL IF SOAKING A MAXI  PAD EVERY HOUR OR MORE FREQUENTLY Warm heat to abdomen every 4 hours x 24 hrs

## 2020-04-11 NOTE — Anesthesia Preprocedure Evaluation (Addendum)
Anesthesia Evaluation  Patient identified by MRN, date of birth, ID band Patient awake    Reviewed: Allergy & Precautions, H&P , NPO status , Patient's Chart, lab work & pertinent test results  Airway Mallampati: III  TM Distance: >3 FB Neck ROM: Full    Dental no notable dental hx. (+) Teeth Intact, Dental Advisory Given   Pulmonary neg pulmonary ROS,    Pulmonary exam normal breath sounds clear to auscultation       Cardiovascular hypertension, Pt. on medications + dysrhythmias Atrial Fibrillation  Rhythm:Regular Rate:Normal     Neuro/Psych negative neurological ROS  negative psych ROS   GI/Hepatic negative GI ROS, Neg liver ROS,   Endo/Other  Morbid obesity  Renal/GU negative Renal ROS  negative genitourinary   Musculoskeletal   Abdominal   Peds  Hematology negative hematology ROS (+)   Anesthesia Other Findings   Reproductive/Obstetrics negative OB ROS                            Anesthesia Physical Anesthesia Plan  ASA: III and emergent  Anesthesia Plan: General   Post-op Pain Management:    Induction: Intravenous, Rapid sequence and Cricoid pressure planned  PONV Risk Score and Plan: 4 or greater and Ondansetron, Dexamethasone and Midazolam  Airway Management Planned: Oral ETT  Additional Equipment:   Intra-op Plan:   Post-operative Plan: Extubation in OR  Informed Consent: I have reviewed the patients History and Physical, chart, labs and discussed the procedure including the risks, benefits and alternatives for the proposed anesthesia with the patient or authorized representative who has indicated his/her understanding and acceptance.     Dental advisory given  Plan Discussed with: CRNA  Anesthesia Plan Comments:         Anesthesia Quick Evaluation

## 2020-04-11 NOTE — Brief Op Note (Signed)
04/11/2020  6:33 PM  PATIENT:  [name] [name]  34 y.o. female  PRE-OPERATIVE DIAGNOSIS:   Ruptured Left Ectopic Pregnancy  POST-OPERATIVE DIAGNOSIS:   ruptured Left Ectopic Pregnancy, hemoperitoneum, anterior abdominal wall adhesion  PROCEDURE:  diagnostic laparoscopy, left salpingectomy, LOA  SURGEON:  Surgeon(s) and Role:    * Jahleah Mariscal, Alanda Slim, MD - Primary  PHYSICIAN ASSISTANT:   ASSISTANTS: none   ANESTHESIA:   general Findings: hemoperitoneum, left ectopic pregnancy, ant omental wall adhesion, right periovarian adhesion, right tube stiff, nl left ovary EBL:  10 mL   BLOOD ADMINISTERED:none  DRAINS: none   LOCAL MEDICATIONS USED:  MARCAINE     SPECIMEN:  Source of Specimen:  left tube with ectopic  DISPOSITION OF SPECIMEN:  PATHOLOGY  COUNTS:  YES  TOURNIQUET:  * No tourniquets in log *  DICTATION: .Other Dictation: Dictation Number [id number]  PLAN OF CARE: Discharge to home after PACU  PATIENT DISPOSITION:  PACU - hemodynamically stable.   Delay start of Pharmacological VTE agent (>24hrs) due to surgical blood loss or risk of bleeding: no

## 2020-04-11 NOTE — Anesthesia Procedure Notes (Signed)
Procedure Name: Intubation Date/Time: 04/11/2020 4:51 PM Performed by: Alain Marion, CRNA Pre-anesthesia Checklist: Patient identified, Emergency Drugs available, Suction available and Patient being monitored Patient Re-evaluated:Patient Re-evaluated prior to induction Oxygen Delivery Method: Circle System Utilized Preoxygenation: Pre-oxygenation with 100% oxygen Induction Type: IV induction and Rapid sequence Laryngoscope Size: Miller and 2 Grade View: Grade I Tube type: Oral Tube size: 7.0 mm Number of attempts: 1 Airway Equipment and Method: Stylet Placement Confirmation: ETT inserted through vocal cords under direct vision,  positive ETCO2 and breath sounds checked- equal and bilateral Secured at: 21 cm Tube secured with: Tape Dental Injury: Teeth and Oropharynx as per pre-operative assessment

## 2020-04-12 ENCOUNTER — Encounter (HOSPITAL_COMMUNITY): Payer: Self-pay | Admitting: Obstetrics and Gynecology

## 2020-04-12 NOTE — Op Note (Signed)
NAME: [name], [name] MEDICAL RECORD [id number] ACCOUNT [id number] DATE OF BIRTH:Sep 28, 1986 FACILITY: [facility] LOCATION: [facility] PHYSICIAN:Sevon Rotert A. Mosi Hannold, MD  OPERATIVE REPORT  DATE OF PROCEDURE:  04/11/2020  PREOPERATIVE DIAGNOSIS:  Ruptured left ectopic pregnancy.  PROCEDURE:  Diagnostic laparoscopy, left salpingectomy, lysis of adhesions.  POSTOPERATIVE DIAGNOSES:  Left ruptured ectopic pregnancy, anterior abdominal wall adhesions, hemoperitoneum.  ANESTHESIA:  General.  SURGEON:  Servando Salina, MD  ASSISTANTS:  Derrell Lolling, CNM  DESCRIPTION OF PROCEDURE:  Under adequate general anesthesia, the patient was placed in the dorsal lithotomy position.  She was sterilely prepped and draped in usual fashion.  An indwelling Foley catheter was sterilely placed.  Examination under  anesthesia revealed a 10-week irregular size uterus.  No adnexal masses could be appreciated, but limited by patient body habitus.  She was sterilely prepped and draped in the usual fashion.  The retractors were removed.  Attention was then turned to the  abdomen.  Marcaine 0.25% was injected at the supraumbilical site.  A vertical supraumbilical incision was then made.  A Veress needle was introduced, tested with normal saline.  Carbon dioxide was insufflated.  Veress needle was then removed.  A 10 mm  disposable trocar with sleeve was introduced into the abdomen without incident.  A lighted video laparoscope was inserted.  On entry into the abdominal cavity, omental adhesion to the anterior abdominal wall was encountered and the abdomen had evidence  of blood throughout the pelvis and the upper abdomen.  At that point, a right lower quadrant port was placed under direct visualization after 0.25% Marcaine was injected.  An incision was made.  Probe was then used to displace the omentum and bowel  upwardly.  Suction was then utilized to start removing the hemoperitoneum.  In the meantime, a left lower  quadrant incision was also made and a 5 mm port placed under direct visualization.  Once this was placed, the omental adhesion to the anterior  abdominal wall was lysed.  The patient was further placed in Trendelenburg and suction of the posterior cul-de-sac of clotted material was done.  There was evidence of a distended left tube with the distal end having  a large amount of clot to which was then  attached to the omentum.  This clotted material came out of the fimbriated end of the tube and it was removed off of the omentum, taking care to check for any evidence of products of conception in that collection of blood.  The right tube and ovary were inspected.  The  right tube appeared stiffened, but otherwise unremarkable.  The right ovary had some periovarian adhesions.  The left tube was then grasped and underlying mesosalpinx was serially clamped, cauterized, and cut using the LigaSure.  The left tube was then  placed in an Endobag through the supraumbilical port and removed with the bag in place.  The trocar was then reinserted supraumbilically and the pelvis was irrigated and suctioned as much as we could.  Good hemostasis was noted.  The lower ports were removed.  The abdomen was deflated.  The supraumbilical port was then removed.  The abdomen was further desufflated and the fascia was closed with 0 Vicryl.  The skin incisions were all approximated with 4-0 Vicryl subcuticular closure.  Instruments from  the vagina were removed.  Specimen labeled left fallopian tube with ectopic was sent to pathology.  ESTIMATED BLOOD LOSS:  Minimal.  Hemoperitoneum was about a liter.  INTRAOPERATIVE FLUIDS:  2 liters.  URINE OUTPUT:  300  mL.  COMPLICATIONS:  None.  DISPOSITION:  The patient tolerated the procedure well and was transferred to recovery room in stable condition.  CN/NUANCE  D:04/11/2020 T:04/12/2020 JOB:[id number]

## 2020-04-12 NOTE — Anesthesia Postprocedure Evaluation (Signed)
Anesthesia Post Note  Patient: [name] [name]  Procedure(s) Performed: LAPAROSCOPIC SALPINGECTOMY/Removal of Hemoperitoneum, lysis of adhesions (Left )     Patient location during evaluation: PACU Anesthesia Type: General Level of consciousness: awake and alert Pain management: pain level controlled Vital Signs Assessment: post-procedure vital signs reviewed and stable Respiratory status: spontaneous breathing, nonlabored ventilation and respiratory function stable Cardiovascular status: blood pressure returned to baseline and stable Postop Assessment: no apparent nausea or vomiting Anesthetic complications: no   No complications documented.  Last Vitals:  Vitals:   04/11/20 1905 04/11/20 1920  BP: (!) 154/87 (!) 169/94  Pulse: 83 86  Resp: 19 17  Temp:  36.7 C  SpO2: 100% 100%    Last Pain:  Vitals:   04/11/20 1920  TempSrc:   PainSc: 8                  Terralyn Matsumura,W. EDMOND

## 2020-04-13 LAB — SURGICAL PATHOLOGY

## 2020-04-25 DIAGNOSIS — Z09 Encounter for follow-up examination after completed treatment for conditions other than malignant neoplasm: Secondary | ICD-10-CM | POA: Diagnosis not present

## 2020-04-25 DIAGNOSIS — Z8759 Personal history of other complications of pregnancy, childbirth and the puerperium: Secondary | ICD-10-CM | POA: Diagnosis not present

## 2020-04-25 DIAGNOSIS — Z3009 Encounter for other general counseling and advice on contraception: Secondary | ICD-10-CM | POA: Diagnosis not present

## 2020-05-08 ENCOUNTER — Encounter (HOSPITAL_COMMUNITY): Payer: 59

## 2020-07-31 DIAGNOSIS — Z03818 Encounter for observation for suspected exposure to other biological agents ruled out: Secondary | ICD-10-CM | POA: Diagnosis not present

## 2020-07-31 DIAGNOSIS — Z1152 Encounter for screening for COVID-19: Secondary | ICD-10-CM | POA: Diagnosis not present

## 2020-09-17 ENCOUNTER — Telehealth: Payer: 59 | Admitting: Emergency Medicine

## 2020-09-17 DIAGNOSIS — J069 Acute upper respiratory infection, unspecified: Secondary | ICD-10-CM

## 2020-09-17 MED ORDER — BENZONATATE 100 MG PO CAPS
100.0000 mg | ORAL_CAPSULE | Freq: Two times a day (BID) | ORAL | 0 refills | Status: DC | PRN
Start: 2020-09-17 — End: 2022-06-30

## 2020-09-17 MED ORDER — FLUTICASONE PROPIONATE 50 MCG/ACT NA SUSP
2.0000 | Freq: Every day | NASAL | 0 refills | Status: DC
Start: 2020-09-17 — End: 2021-08-20

## 2020-09-17 NOTE — Progress Notes (Signed)

## 2021-03-29 DIAGNOSIS — Z0100 Encounter for examination of eyes and vision without abnormal findings: Secondary | ICD-10-CM | POA: Diagnosis not present

## 2021-05-24 ENCOUNTER — Encounter (HOSPITAL_COMMUNITY): Payer: Self-pay

## 2021-05-24 ENCOUNTER — Other Ambulatory Visit (HOSPITAL_COMMUNITY): Payer: Self-pay | Admitting: *Deleted

## 2021-07-02 ENCOUNTER — Other Ambulatory Visit (HOSPITAL_COMMUNITY): Payer: Self-pay

## 2021-07-02 DIAGNOSIS — Z23 Encounter for immunization: Secondary | ICD-10-CM | POA: Diagnosis not present

## 2021-07-02 DIAGNOSIS — I4891 Unspecified atrial fibrillation: Secondary | ICD-10-CM | POA: Diagnosis not present

## 2021-07-02 DIAGNOSIS — I10 Essential (primary) hypertension: Secondary | ICD-10-CM | POA: Diagnosis not present

## 2021-07-02 MED ORDER — METOPROLOL SUCCINATE ER 100 MG PO TB24
100.0000 mg | ORAL_TABLET | Freq: Every day | ORAL | 0 refills | Status: DC
  Filled 2021-07-02: qty 90, 90d supply, fill #0

## 2021-07-03 ENCOUNTER — Other Ambulatory Visit (HOSPITAL_COMMUNITY): Payer: Self-pay

## 2021-07-30 DIAGNOSIS — Z6839 Body mass index (BMI) 39.0-39.9, adult: Secondary | ICD-10-CM | POA: Diagnosis not present

## 2021-07-30 DIAGNOSIS — R011 Cardiac murmur, unspecified: Secondary | ICD-10-CM | POA: Diagnosis not present

## 2021-07-30 DIAGNOSIS — Z113 Encounter for screening for infections with a predominantly sexual mode of transmission: Secondary | ICD-10-CM | POA: Diagnosis not present

## 2021-07-30 DIAGNOSIS — N946 Dysmenorrhea, unspecified: Secondary | ICD-10-CM | POA: Diagnosis not present

## 2021-07-30 DIAGNOSIS — Z124 Encounter for screening for malignant neoplasm of cervix: Secondary | ICD-10-CM | POA: Diagnosis not present

## 2021-07-30 DIAGNOSIS — N940 Mittelschmerz: Secondary | ICD-10-CM | POA: Diagnosis not present

## 2021-07-30 DIAGNOSIS — Z3040 Encounter for surveillance of contraceptives, unspecified: Secondary | ICD-10-CM | POA: Diagnosis not present

## 2021-07-30 DIAGNOSIS — Z01411 Encounter for gynecological examination (general) (routine) with abnormal findings: Secondary | ICD-10-CM | POA: Diagnosis not present

## 2021-07-30 DIAGNOSIS — Z8759 Personal history of other complications of pregnancy, childbirth and the puerperium: Secondary | ICD-10-CM | POA: Diagnosis not present

## 2021-08-13 ENCOUNTER — Other Ambulatory Visit (HOSPITAL_COMMUNITY): Payer: Self-pay

## 2021-08-13 DIAGNOSIS — I4891 Unspecified atrial fibrillation: Secondary | ICD-10-CM | POA: Diagnosis not present

## 2021-08-13 DIAGNOSIS — I10 Essential (primary) hypertension: Secondary | ICD-10-CM | POA: Diagnosis not present

## 2021-08-13 MED ORDER — VALSARTAN-HYDROCHLOROTHIAZIDE 160-25 MG PO TABS
1.0000 | ORAL_TABLET | Freq: Every day | ORAL | 0 refills | Status: DC
  Filled 2021-08-13: qty 90, 90d supply, fill #0

## 2021-08-14 ENCOUNTER — Telehealth (HOSPITAL_COMMUNITY): Payer: Self-pay

## 2021-08-14 NOTE — Telephone Encounter (Signed)
Received a fax requesting medical records from Rachell Cipro MD. Records were successfully faxed to: [phone] ,which was the number provided.. Medical request form will be scanned into patients chart.

## 2021-08-16 ENCOUNTER — Other Ambulatory Visit (HOSPITAL_COMMUNITY): Payer: Self-pay

## 2021-08-19 ENCOUNTER — Telehealth (HOSPITAL_COMMUNITY): Payer: Self-pay

## 2021-08-19 NOTE — Telephone Encounter (Signed)
Called and left patient a detailed message of all thew above information to confirm/remind patient of their appointment at the [facility] on 08/20/21. Patient reminded to bring all medications and/or complete list.

## 2021-08-19 NOTE — Progress Notes (Addendum)
Advanced Heart Failure Clinic Note  PCP:  Fanny Bien, MD  Cardiologist: Dr Aundra Dubin  Chief Complaint: Heart Failure Follow Up   History of Present Illness: [name] [name] is a 35 y.o. female with a history of pregnancy induced HTN, systolic HF with EF 47% on cMRI (12/2018), acute PE 12/2018 now on Eliquis, possible LV thrombus, and PAF on Eliquis.    Admitted 12/17/18 with 30 lb weight gain. CTA showed RLL PE with no obvious right heart strain. She was started on heparin and transitioned to Eliquis prior to discharge. Echo showed EF 20-25% with moderately reduced RV systolic function. AHF team consulted. She was diuresed with lasix drip and metolazone. Coronary CT showed no CAD. HF medications were optimized. cMRI showed EF 16% with possible LV thrombus, RV EF 21%, and LGE pattern that could be consistent with myocarditis. Co-ox was monitored and remained normal. She had an episode of atrial fibrillation that converted with IV amiodarone. She was sent home on amiodarone but is now off it.    Echo in 6/20 showed EF up to 60-65% with moderate LVH and normal RV size and systolic function.   She became pregnant 5/21 and she was instructed to stop spiro, Entresto and Eliquis; she stopped carvedilol and BiDil as well. She followed up with AHF PharmD 6/21 and BP was 180s/110s, requiring clonidine. Carvedilol, BiDil and Lasix were restarted.  Unfortunately she suffered and ectopic pregnancy w/ hemoperitoneum and underwent left salpingectomy 6/21.  Lost to follow up. Ran out of HF meds about 6 months ago, saw PCP a couple weekly ago who started beta blocker and ARB.  Today she returns for HF follow up. BP at home 150/110 but overall feeling fine. Denies SOB, CP, dizziness, edema, or PND/Orthopnea. Appetite ok. No fever or chills. Weight at home 228 pounds. Taking all medications. She works in Press photographer at [employer]. She is married and not using contraception, but not "actively" trying for  pregnancy.  ECG (personally reviewed): SR, lateral TWI since previous tracing  PMH: 1. Pregnancy-induced HTN 2. Pulmonary embolus: RLL, 3/20.  3. Atrial fibrillation: Paroxysmal.  Episode noted during 3/20 hospitalization.  4. Chronic systolic CHF: Nonischemic cardiomyopathy.  - Echo (3/20) with EF 20-25%, moderately decreased RV systolic function.  - Cardiac MRI (3/20): EF 16% with possible LV thrombus, RV EF 21%, and LGE pattern that could be consistent with myocarditis. - Coronary CTA (3/20): No CAD - Echo (6/20): EF 60-65%, moderate LVH, normal RV size and systolic function 5. Sleep study 6/20 showed no OSA.   Past Surgical History:  Procedure Laterality Date   BIRTH CONTROL IMPLANT Left 03/2016   "Nexplanon in my upper arm"   CESAREAN SECTION N/A 02/25/2016   Procedure: CESAREAN SECTION;  Surgeon: Truett Mainland, DO;  Location: [place];  Service: Obstetrics;  Laterality: N/A;   LAPAROSCOPIC SALPINGO OOPHERECTOMY Left 04/11/2020   Procedure: LAPAROSCOPIC SALPINGECTOMY/Removal of Hemoperitoneum, lysis of adhesions;  Surgeon: Servando Salina, MD;  Location: [facility];  Service: Gynecology;  Laterality: Left;   Current Outpatient Medications  Medication Sig Dispense Refill   benzonatate (TESSALON) 100 MG capsule Take 1 capsule (100 mg total) by mouth 2 (two) times daily as needed for cough. 20 capsule 0   metoprolol succinate (TOPROL-XL) 100 MG 24 hr tablet Take 1 tablet (100 mg total) by mouth daily. 90 tablet 0   valsartan-hydrochlorothiazide (DIOVAN-HCT) 160-25 MG tablet Take 1 tablet by mouth daily. 90 tablet 0   No current facility-administered medications  for this encounter.    Allergies:   Patient has no known allergies.   Social History:  The patient  reports that she has never smoked. She has never used smokeless tobacco. She reports that she does not drink alcohol and does not use drugs.   Family History:  The patient's family history includes Heart disease  in her mother; Hypertension in her mother.   ROS:  Please see the history of present illness.   All other systems are personally reviewed and negative.   Recent Labs: No results found for requested labs within last 8760 hours.  Personally reviewed   Wt Readings from Last 3 Encounters:  08/20/21 105.6 kg  04/11/20 107 kg  03/21/20 105.8 kg   BP (!) 174/130   Pulse 72   Wt 105.6 kg (232 lb 12.8 oz)   SpO2 100%   BMI 41.24 kg/m   Physical Exam: ReDs: 35% General:  NAD. No resp difficulty HEENT: Normal Neck: Supple. JVP 7-8. Carotids 2+ bilat; no bruits. No lymphadenopathy or thryomegaly appreciated. Cor: PMI nondisplaced. Regular rate & rhythm. No rubs, gallops or murmurs. Lungs: Clear Abdomen: Obese, nontender, nondistended. No hepatosplenomegaly. No bruits or masses. Good bowel sounds. Extremities: No cyanosis, clubbing, rash, edema Neuro: Alert & oriented x 3, cranial nerves grossly intact. Moves all 4 extremities w/o difficulty. Affect pleasant.  ASSESSMENT AND PLAN: 1. Chronic systolic CHF: Nonischemic cardiomyopathy. EF was reportedly low on echo at hospital in [facility] in 12/19.  Echo in 3/20 showed EF 20-25% with moderate RV systolic dysfunction.  There had been no recent pregnancy so doubt peri-partum CMP.  No family history of CHF and coronary CTA showed no CAD.  Cardiac MRI showed LV EF 16% with possible small LV thrombus, RV EF 21%.  Delayed enhancement imaging showed non-coronary pattern LGE that could be consistent with myocarditis.  Repeat echo with medical therapy in 6/20 showed improvement in EF to 60-65%.  Currently, NYHA class I symptoms and appears mildly volume up, although exam difficult for volume, ReDs 35%.  - Stop Diovan-HCT. - Start Losartan 25 mg daily. - Start Lasix 20 mg daily + 10 mEq of KCL daily. BMET today; repeat BMET in 1 week. - Start spironolactone 25 mg daily.  - Continue Toprol XL 100 mg daily. - Add BiDil next. - Would benefit from  SGLT2i. - EF previously recovered, however she has been off her medications for sometime now. Will repeat echo. - She is s/p Left salpingectomy 6/21. Long discussion today about avoiding pregnancy with her uncontrolled hypertension and teratogenicity of HF medications. 2. Hypertensive Urgency: Very elevated today in setting of being off her medications x 6 months. - Restart meds as above. - Restart BiDil next. - Instructed to check BP at home and bring log to next visit. 3. Pulmonary embolus: Occurred in 3/20.  May have been triggered by low cardiac output/stasis of flow.  She has no family history of VTE.  - Restart Eliquis 5 mg bid. Discussed with Dr. Aundra Dubin. Will need to stop if she becomes pregnant. 4. Atrial fibrillation: Paroxysmal. Likely triggered by CHF and PE. No palpitations.  Now off amiodarone. NSR on ECG today. - CHA2DS2-VASc Score =  at least 3  - Restart Eliquis 5 mg bid.  5. Possible small LV thrombus: Not seen on repeat echo in 6/20.  Eliquis as above. 6. Snoring: Had previous sleep study at [facility] in 2020, negative for OSA. 7. Obesity: Body mass index is 41.24 kg/m. - Needs weight  loss.  Followup in 2-3 weeks with APP for further medication titration (add BiDil +/- SGLT2i).  Signed, Rafael Bihari, FNP  08/20/2021  [facility] [address] Heart and [place] [address] [phone] (office) [phone] (fax)

## 2021-08-20 ENCOUNTER — Ambulatory Visit (HOSPITAL_COMMUNITY)
Admission: RE | Admit: 2021-08-20 | Discharge: 2021-08-20 | Disposition: A | Discharge status: Home / Self Care | Payer: 59 | Source: Ambulatory Visit | Attending: Family Medicine | Admitting: Family Medicine

## 2021-08-20 ENCOUNTER — Other Ambulatory Visit (HOSPITAL_COMMUNITY): Payer: Self-pay

## 2021-08-20 ENCOUNTER — Other Ambulatory Visit: Payer: Self-pay

## 2021-08-20 ENCOUNTER — Encounter (HOSPITAL_COMMUNITY): Payer: Self-pay

## 2021-08-20 VITALS — BP 174/130 | HR 72 | Wt 232.8 lb

## 2021-08-20 DIAGNOSIS — I480 Paroxysmal atrial fibrillation: Secondary | ICD-10-CM | POA: Insufficient documentation

## 2021-08-20 DIAGNOSIS — I428 Other cardiomyopathies: Secondary | ICD-10-CM | POA: Diagnosis not present

## 2021-08-20 DIAGNOSIS — I4891 Unspecified atrial fibrillation: Secondary | ICD-10-CM

## 2021-08-20 DIAGNOSIS — I160 Hypertensive urgency: Secondary | ICD-10-CM | POA: Diagnosis not present

## 2021-08-20 DIAGNOSIS — I110 Hypertensive heart disease with heart failure: Secondary | ICD-10-CM | POA: Diagnosis not present

## 2021-08-20 DIAGNOSIS — R0683 Snoring: Secondary | ICD-10-CM | POA: Insufficient documentation

## 2021-08-20 DIAGNOSIS — E669 Obesity, unspecified: Secondary | ICD-10-CM | POA: Diagnosis not present

## 2021-08-20 DIAGNOSIS — Z6841 Body Mass Index (BMI) 40.0 and over, adult: Secondary | ICD-10-CM | POA: Diagnosis not present

## 2021-08-20 DIAGNOSIS — I5042 Chronic combined systolic (congestive) and diastolic (congestive) heart failure: Secondary | ICD-10-CM | POA: Diagnosis not present

## 2021-08-20 DIAGNOSIS — Z86711 Personal history of pulmonary embolism: Secondary | ICD-10-CM | POA: Insufficient documentation

## 2021-08-20 DIAGNOSIS — Z7901 Long term (current) use of anticoagulants: Secondary | ICD-10-CM | POA: Diagnosis not present

## 2021-08-20 LAB — TSH: TSH: 2.07 u[IU]/mL (ref 0.350–4.500)

## 2021-08-20 LAB — BASIC METABOLIC PANEL
Anion gap: 9 (ref 5–15)
BUN: 13 mg/dL (ref 6–20)
CO2: 26 mmol/L (ref 22–32)
Calcium: 9.2 mg/dL (ref 8.9–10.3)
Chloride: 100 mmol/L (ref 98–111)
Creatinine, Ser: 1.11 mg/dL — ABNORMAL HIGH (ref 0.44–1.00)
GFR, Estimated: >60 mL/min (ref >60)
Glucose, Bld: 98 mg/dL (ref 70–99)
Potassium: 3.2 mmol/L — ABNORMAL LOW (ref 3.5–5.1)
Sodium: 135 mmol/L (ref 135–145)

## 2021-08-20 LAB — CBC
HCT: 41 % (ref 36.0–46.0)
Hemoglobin: 13.1 g/dL (ref 12.0–15.0)
MCH: 25.7 pg — ABNORMAL LOW (ref 26.0–34.0)
MCHC: 32 g/dL (ref 30.0–36.0)
MCV: 80.6 fL (ref 80.0–100.0)
Platelets: 248 10*3/uL (ref 150–400)
RBC: 5.09 MIL/uL (ref 3.87–5.11)
RDW: 13.6 % (ref 11.5–15.5)
WBC: 5.7 10*3/uL (ref 4.0–10.5)
nRBC: 0 % (ref 0.0–0.2)

## 2021-08-20 LAB — HEMOGLOBIN A1C
Hgb A1c MFr Bld: 5.5 % (ref 4.8–5.6)
Mean Plasma Glucose: 111.15 mg/dL

## 2021-08-20 MED ORDER — FUROSEMIDE 20 MG PO TABS
20.0000 mg | ORAL_TABLET | Freq: Every day | ORAL | 1 refills | Status: DC
  Filled 2021-08-20: qty 90, 90d supply, fill #0

## 2021-08-20 MED ORDER — SPIRONOLACTONE 25 MG PO TABS
25.0000 mg | ORAL_TABLET | Freq: Every day | ORAL | 1 refills | Status: DC
Start: 2021-08-20 — End: 2022-04-30
  Filled 2021-08-20: qty 90, 90d supply, fill #0
  Filled 2022-01-06: qty 90, 90d supply, fill #1

## 2021-08-20 MED ORDER — LOSARTAN POTASSIUM 25 MG PO TABS
25.0000 mg | ORAL_TABLET | Freq: Every day | ORAL | 1 refills | Status: DC
Start: 2021-08-20 — End: 2021-09-13
  Filled 2021-08-20: qty 90, 90d supply, fill #0

## 2021-08-20 MED ORDER — APIXABAN 5 MG PO TABS
5.0000 mg | ORAL_TABLET | Freq: Two times a day (BID) | ORAL | 4 refills | Status: DC
Start: 2021-08-20 — End: 2022-10-30
  Filled 2021-08-20: qty 60, 30d supply, fill #0
  Filled 2021-10-14: qty 60, 30d supply, fill #1
  Filled 2021-12-06: qty 60, 30d supply, fill #2
  Filled 2022-01-29: qty 60, 30d supply, fill #3
  Filled 2022-07-16: qty 60, 30d supply, fill #4

## 2021-08-20 NOTE — Patient Instructions (Addendum)
EKG was done today  Labs were done today, if any labs are abnormal the clinic will call you  Your physician recommends that you return for lab work in: 1 week  You will be scheduled for a echocardiogram  Your physician recommends that you schedule a follow-up appointment in: 2 weeks  STOP Diovan/HCT  START Losartan 25 mg 1 tablet daily   START Spironolactone 25 mg 1 tablet daily   START Lasix 20 mg 1 tablet daily   RESTART Eliquis 5 mg 1 tablet two times a day  CHECK BP daily log each reading and bring to next visit  At the [facility], you and your health needs are our priority. As part of our continuing mission to provide you with exceptional heart care, we have created designated Provider Care Teams. These Care Teams include your primary Cardiologist (physician) and Advanced Practice Providers (APPs- Physician Assistants and Nurse Practitioners) who all work together to provide you with the care you need, when you need it.   You may see any of the following providers on your designated Care Team at your next follow up: Dr Glori Bickers Dr Haynes Kerns, NP Lyda Jester, [place] [facility] [place], [place] Audry Riles, PharmD   Please be sure to bring in all your medications bottles to every appointment. .  If you have any questions or concerns before your next appointment please send [place] a message through [place] or call our office at [phone].    TO LEAVE A MESSAGE FOR THE NURSE SELECT OPTION 2, PLEASE LEAVE A MESSAGE INCLUDING: YOUR NAME DATE OF BIRTH CALL BACK NUMBER REASON FOR CALL**this is important as we prioritize the call backs  YOU WILL RECEIVE A CALL BACK THE SAME DAY AS LONG AS YOU CALL BEFORE 4:00 PM

## 2021-08-20 NOTE — Progress Notes (Signed)
ReDS Vest / Clip - 08/20/21 0900       ReDS Vest / Clip   Station Marker B    Ruler Value 37.5    ReDS Value Range Low volume    ReDS Actual Value 35

## 2021-08-20 NOTE — Addendum Note (Signed)
Encounter addended by: Rafael Bihari, FNP on: 08/20/2021 3:04 PM  Actions taken: Clinical Note Signed

## 2021-08-22 ENCOUNTER — Other Ambulatory Visit (HOSPITAL_COMMUNITY): Payer: Self-pay

## 2021-08-22 NOTE — Telephone Encounter (Signed)
error 

## 2021-08-27 ENCOUNTER — Other Ambulatory Visit: Payer: Self-pay

## 2021-08-27 ENCOUNTER — Ambulatory Visit (HOSPITAL_COMMUNITY)
Admission: RE | Admit: 2021-08-27 | Discharge: 2021-08-27 | Disposition: A | Discharge status: Home / Self Care | Payer: 59 | Source: Ambulatory Visit | Attending: Internal Medicine | Admitting: Internal Medicine

## 2021-08-27 DIAGNOSIS — I5042 Chronic combined systolic (congestive) and diastolic (congestive) heart failure: Secondary | ICD-10-CM | POA: Diagnosis not present

## 2021-08-27 LAB — BASIC METABOLIC PANEL
Anion gap: 6 (ref 5–15)
BUN: 11 mg/dL (ref 6–20)
CO2: 27 mmol/L (ref 22–32)
Calcium: 9.3 mg/dL (ref 8.9–10.3)
Chloride: 104 mmol/L (ref 98–111)
Creatinine, Ser: 0.96 mg/dL (ref 0.44–1.00)
GFR, Estimated: >60 mL/min (ref >60)
Glucose, Bld: 89 mg/dL (ref 70–99)
Potassium: 3.8 mmol/L (ref 3.5–5.1)
Sodium: 137 mmol/L (ref 135–145)

## 2021-09-10 ENCOUNTER — Ambulatory Visit (HOSPITAL_COMMUNITY)
Admission: RE | Admit: 2021-09-10 | Discharge: 2021-09-10 | Disposition: A | Discharge status: Home / Self Care | Payer: 59 | Source: Ambulatory Visit | Attending: Family Medicine | Admitting: Family Medicine

## 2021-09-10 DIAGNOSIS — I5042 Chronic combined systolic (congestive) and diastolic (congestive) heart failure: Secondary | ICD-10-CM | POA: Insufficient documentation

## 2021-09-10 DIAGNOSIS — I081 Rheumatic disorders of both mitral and tricuspid valves: Secondary | ICD-10-CM | POA: Insufficient documentation

## 2021-09-10 DIAGNOSIS — I110 Hypertensive heart disease with heart failure: Secondary | ICD-10-CM | POA: Insufficient documentation

## 2021-09-10 LAB — ECHOCARDIOGRAM COMPLETE
AR max vel: 1.58 cm2
AV Peak grad: 14.4 mmHg
Ao pk vel: 1.9 m/s
Area-P 1/2: 2.78 cm2
Calc EF: 53.5 %
MV M vel: 5.25 m/s
MV Peak grad: 110.3 mmHg
S' Lateral: 4.8 cm
Single Plane A2C EF: 54.2 %
Single Plane A4C EF: 52.6 %

## 2021-09-12 ENCOUNTER — Telehealth (HOSPITAL_COMMUNITY): Payer: Self-pay

## 2021-09-12 NOTE — Telephone Encounter (Signed)
Called and left patient a detailed message to confirm/remind patient of their appointment at the [facility] on 09/13/21.   I also left a message to remind patient to bring all medications and/or complete list and to return a call to our office if needed at [phone].

## 2021-09-13 ENCOUNTER — Encounter (HOSPITAL_COMMUNITY): Payer: Self-pay

## 2021-09-13 ENCOUNTER — Ambulatory Visit (HOSPITAL_COMMUNITY)
Admission: RE | Admit: 2021-09-13 | Discharge: 2021-09-13 | Disposition: A | Discharge status: Home / Self Care | Payer: 59 | Source: Ambulatory Visit | Attending: Family Medicine | Admitting: Family Medicine

## 2021-09-13 ENCOUNTER — Other Ambulatory Visit: Payer: Self-pay

## 2021-09-13 ENCOUNTER — Other Ambulatory Visit (HOSPITAL_COMMUNITY): Payer: Self-pay

## 2021-09-13 VITALS — BP 166/98 | HR 63 | Wt 238.0 lb

## 2021-09-13 DIAGNOSIS — I5042 Chronic combined systolic (congestive) and diastolic (congestive) heart failure: Secondary | ICD-10-CM | POA: Diagnosis not present

## 2021-09-13 DIAGNOSIS — I110 Hypertensive heart disease with heart failure: Secondary | ICD-10-CM | POA: Insufficient documentation

## 2021-09-13 DIAGNOSIS — I10 Essential (primary) hypertension: Secondary | ICD-10-CM

## 2021-09-13 DIAGNOSIS — Z7182 Exercise counseling: Secondary | ICD-10-CM | POA: Diagnosis not present

## 2021-09-13 DIAGNOSIS — I5022 Chronic systolic (congestive) heart failure: Secondary | ICD-10-CM | POA: Insufficient documentation

## 2021-09-13 DIAGNOSIS — I4891 Unspecified atrial fibrillation: Secondary | ICD-10-CM

## 2021-09-13 DIAGNOSIS — Z86711 Personal history of pulmonary embolism: Secondary | ICD-10-CM | POA: Insufficient documentation

## 2021-09-13 DIAGNOSIS — I480 Paroxysmal atrial fibrillation: Secondary | ICD-10-CM | POA: Diagnosis not present

## 2021-09-13 DIAGNOSIS — Z6379 Other stressful life events affecting family and household: Secondary | ICD-10-CM | POA: Insufficient documentation

## 2021-09-13 DIAGNOSIS — I428 Other cardiomyopathies: Secondary | ICD-10-CM | POA: Diagnosis not present

## 2021-09-13 DIAGNOSIS — Z79899 Other long term (current) drug therapy: Secondary | ICD-10-CM | POA: Insufficient documentation

## 2021-09-13 DIAGNOSIS — Z9079 Acquired absence of other genital organ(s): Secondary | ICD-10-CM | POA: Diagnosis not present

## 2021-09-13 DIAGNOSIS — Z7901 Long term (current) use of anticoagulants: Secondary | ICD-10-CM | POA: Diagnosis not present

## 2021-09-13 DIAGNOSIS — E669 Obesity, unspecified: Secondary | ICD-10-CM | POA: Insufficient documentation

## 2021-09-13 DIAGNOSIS — Z6841 Body Mass Index (BMI) 40.0 and over, adult: Secondary | ICD-10-CM | POA: Diagnosis not present

## 2021-09-13 DIAGNOSIS — R0683 Snoring: Secondary | ICD-10-CM | POA: Insufficient documentation

## 2021-09-13 LAB — BASIC METABOLIC PANEL
Anion gap: 5 (ref 5–15)
BUN: 13 mg/dL (ref 6–20)
CO2: 26 mmol/L (ref 22–32)
Calcium: 9.5 mg/dL (ref 8.9–10.3)
Chloride: 108 mmol/L (ref 98–111)
Creatinine, Ser: 0.99 mg/dL (ref 0.44–1.00)
GFR, Estimated: >60 mL/min (ref >60)
Glucose, Bld: 93 mg/dL (ref 70–99)
Potassium: 4.1 mmol/L (ref 3.5–5.1)
Sodium: 139 mmol/L (ref 135–145)

## 2021-09-13 MED ORDER — ENTRESTO 49-51 MG PO TABS
1.0000 | ORAL_TABLET | Freq: Two times a day (BID) | ORAL | 3 refills | Status: DC
  Filled 2021-09-13: qty 180, 90d supply, fill #0

## 2021-09-13 MED ORDER — ENTRESTO 49-51 MG PO TABS
1.0000 | ORAL_TABLET | Freq: Two times a day (BID) | ORAL | 0 refills | Status: DC
  Filled 2021-09-13: qty 60, 30d supply, fill #0

## 2021-09-13 MED ORDER — FUROSEMIDE 20 MG PO TABS
20.0000 mg | ORAL_TABLET | ORAL | 3 refills | Status: DC | PRN
  Filled 2021-09-13: qty 30, fill #0
  Filled 2022-02-12 – 2022-02-21 (×2): qty 30, 30d supply, fill #0

## 2021-09-13 NOTE — Patient Instructions (Signed)
STOP Losartan START Entresto 49/51 mg, one tab twice a day CHANGE Lasix 20 mg, to as needed for weight gain or  swelling  Labs today We will only contact you if something comes back abnormal or we need to make some changes. Otherwise no news is good news!  Labs needed in 7-10 days  Your physician recommends that you schedule a follow-up appointment in: 4-6 weeks  in the [facility] ([facility]NP[facility] Clinic and in 3 months with Dr Aundra Dubin and echo  Your physician has requested that you have an echocardiogram. Echocardiography is a painless test that uses sound waves to create images of your heart. It provides your doctor with information about the size and shape of your heart and how well your heart's chambers and valves are working. This procedure takes approximately one hour. There are no restrictions for this procedure.  Do the following things EVERYDAY: Weigh yourself in the morning before breakfast. Write it down and keep it in a log. Take your medicines as prescribed Eat low salt foods--Limit salt (sodium) to 2000 mg per day.  Stay as active as you can everyday Limit all fluids for the day to less than 2 liters  At the [facility], you and your health needs are our priority. As part of our continuing mission to provide you with exceptional heart care, we have created designated Provider Care Teams. These Care Teams include your primary Cardiologist (physician) and Advanced Practice Providers (APPs- Physician Assistants and Nurse Practitioners) who all work together to provide you with the care you need, when you need it.   You may see any of the following providers on your designated Care Team at your next follow up: Dr Glori Bickers Dr Haynes Kerns, NP Lyda Jester, [place] [facility] [place], [place] Audry Riles, PharmD   Please be sure to bring in all your medications bottles to every appointment.   If you have any questions or  concerns before your next appointment please send [place] a message through [place] or call our office at [phone].    TO LEAVE A MESSAGE FOR THE NURSE SELECT OPTION 2, PLEASE LEAVE A MESSAGE INCLUDING: YOUR NAME DATE OF BIRTH CALL BACK NUMBER REASON FOR CALL**this is important as we prioritize the call backs  YOU WILL RECEIVE A CALL BACK THE SAME DAY AS LONG AS YOU CALL BEFORE 4:00 PM

## 2021-09-13 NOTE — Progress Notes (Signed)
[facility] Note  PCP:  Fanny Bien, MD  Cardiologist: Dr Aundra Dubin  Chief Complaint: Heart Failure Follow Up   History of Present Illness: [name] [name] is a 35 y.o. female with a history of pregnancy induced HTN, systolic HF with EF 89% on cMRI (12/2018), acute PE 12/2018 now on Eliquis, possible LV thrombus, and PAF on Eliquis.    Admitted 12/17/18 with 30 lb weight gain. CTA showed RLL PE with no obvious right heart strain. She was started on heparin and transitioned to Eliquis prior to discharge. Echo showed EF 20-25% with moderately reduced RV systolic function. AHF team consulted. She was diuresed with lasix drip and metolazone. Coronary CT showed no CAD. HF medications were optimized. cMRI showed EF 16% with possible LV thrombus, RV EF 21%, and LGE pattern that could be consistent with myocarditis. Co-ox was monitored and remained normal. She had an episode of atrial fibrillation that converted with IV amiodarone. She was sent home on amiodarone but is now off it.    Echo in 6/20 showed EF up to 60-65% with moderate LVH and normal RV size and systolic function.   She became pregnant 5/21 and she was instructed to stop spiro, Entresto and Eliquis; she stopped carvedilol and BiDil as well. She followed up with [facility] PharmD 6/21 and BP was 180s/110s, requiring clonidine. Carvedilol, BiDil and Lasix were restarted.  Unfortunately she suffered and ectopic pregnancy w/ hemoperitoneum and underwent left salpingectomy 6/21.  Lost to follow up. Ran out of HF meds about 6 months ago, saw PCP a couple weekly ago who started beta blocker and ARB.  Today she returns for HF follow up. Overall feeling fine. Denies increasing SOB, CP, dizziness, edema, or PND/Orthopnea. Appetite ok. No fever or chills. Has not checked weight recently, but usually runs 228-232 lbs. Taking all medications. 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 Lots of stress at home.  ECG  (personally reviewed): none ordered today.  PMH: 1. Pregnancy-induced HTN 2. Pulmonary embolus: RLL, 3/20.  3. Atrial fibrillation: Paroxysmal.  Episode noted during 3/20 hospitalization.  4. Chronic systolic CHF: Nonischemic cardiomyopathy.  - Echo (3/20) with EF 20-25%, moderately decreased RV systolic function.  - Cardiac MRI (3/20): EF 16% with possible LV thrombus, RV EF 21%, and LGE pattern that could be consistent with myocarditis. - Coronary CTA (3/20): No CAD - Echo (6/20): EF 60-65%, moderate LVH, normal RV size and systolic function - Echo (11/22): EF 35-40%, global HK, grade II DD, RV is low/normal,  5. Sleep study 6/20 showed no OSA.   Past Surgical History:  Procedure Laterality Date   BIRTH CONTROL IMPLANT Left 03/2016   "Nexplanon in my upper arm"   CESAREAN SECTION N/A 02/25/2016   Procedure: CESAREAN SECTION;  Surgeon: Truett Mainland, DO;  Location: [facility];  Service: Obstetrics;  Laterality: N/A;   LAPAROSCOPIC SALPINGO OOPHERECTOMY Left 04/11/2020   Procedure: LAPAROSCOPIC SALPINGECTOMY/Removal of Hemoperitoneum, lysis of adhesions;  Surgeon: Servando Salina, MD;  Location: [facility];  Service: Gynecology;  Laterality: Left;   Current Outpatient Medications  Medication Sig Dispense Refill   apixaban (ELIQUIS) 5 MG TABS tablet Take 1 tablet (5 mg total) by mouth 2 (two) times daily. 60 tablet 4   benzonatate (TESSALON) 100 MG capsule Take 1 capsule (100 mg total) by mouth 2 (two) times daily as needed for cough. 20 capsule 0   furosemide (LASIX) 20 MG tablet Take 20 mg by mouth as  needed.     losartan (COZAAR) 25 MG tablet Take 1 tablet (25 mg total) by mouth daily. 90 tablet 1   metoprolol succinate (TOPROL-XL) 100 MG 24 hr tablet Take 1 tablet (100 mg total) by mouth daily. 90 tablet 0   spironolactone (ALDACTONE) 25 MG tablet Take 1 tablet (25 mg total) by mouth daily. 90 tablet 1   No current facility-administered medications for this encounter.     Allergies:   Patient has no known allergies.   Social History:  The patient  reports that she has never smoked. She has never used smokeless tobacco. She reports that she does not drink alcohol and does not use drugs.   Family History:  The patient's family history includes Heart disease in her mother; Hypertension in her mother.   ROS:  Please see the history of present illness.   All other systems are personally reviewed and negative.   Recent Labs: 08/20/2021: Hemoglobin 13.1; Platelets 248; TSH 2.070 08/27/2021: BUN 11; Creatinine, Ser 0.96; Potassium 3.8; Sodium 137  Personally reviewed   Wt Readings from Last 3 Encounters:  09/13/21 108 kg (238 lb)  08/20/21 105.6 kg (232 lb 12.8 oz)  04/11/20 107 kg (236 lb)   BP (!) 166/98   Pulse 63   Wt 108 kg (238 lb)   SpO2 99%   BMI 42.16 kg/m   Physical Exam: General:  NAD. No resp difficulty, tearful HEENT: Normal Neck: Supple. No JVD. Carotids 2+ bilat; no bruits. No lymphadenopathy or thryomegaly appreciated. Cor: PMI nondisplaced. Regular rate & rhythm. No rubs, gallops or murmurs. Lungs: Clear Abdomen: Obese, nontender, nondistended. No hepatosplenomegaly. No bruits or masses. Good bowel sounds. Extremities: No cyanosis, clubbing, rash, edema Neuro: Alert & oriented x 3, cranial nerves grossly intact. Moves all 4 extremities w/o difficulty. Affect pleasant.  ASSESSMENT AND PLAN: 1. Chronic systolic CHF: Nonischemic cardiomyopathy. EF was reportedly low on echo at hospital in [facility] in 12/19.  Echo in 3/20 showed EF 20-25% with moderate RV systolic dysfunction.  There had been no recent pregnancy so doubt peri-partum CMP.  No family history of CHF and coronary CTA showed no CAD.  Cardiac MRI showed LV EF 16% with possible small LV thrombus, RV EF 21%.  Delayed enhancement imaging showed non-coronary pattern LGE that could be consistent with myocarditis.  Repeat echo with medical therapy in 6/20 showed improvement in EF  to 60-65%.  Unfortunately, she was lost to follow up and ran out of her medications x 6 months. Echo (11/22) showed EF down to 35-40%. We discussed these results today. Currently, NYHA class I-II symptoms, although not physically active. She does not appear volume overloaded on exam. - Stop losartan. - Start Entresto 49/51 mg bid. BMET today, repeat in 10 days. - Change Lasix 20 mg daily to PRN weight gain/edema. - Continue spironolactone 25 mg daily.  - Continue Toprol XL 100 mg daily.  - Add BiDil next. - Would benefit from SGLT2i. Recent A1c 5.5 - She is s/p Left salpingectomy 6/21. Discussed today about avoiding pregnancy with her uncontrolled hypertension and teratogenicity of HF medications. 2. HTN: Recent home readings 140-160s. - Start [place] as above. - Add BiDil next. - Instructed to continue to check BP at home and bring log to next visit. 3. Pulmonary embolus: Occurred in 3/20.  May have been triggered by low cardiac output/stasis of flow.  She has no family history of VTE.  - Continue Eliquis 5 mg bid. Will need to stop if  she becomes pregnant.  4. Atrial fibrillation: Paroxysmal. Likely triggered by CHF and PE. No palpitations.  Now off amiodarone. Regular on exam today. - CHA2DS2-VASc Score =  at least 3  - Continue Eliquis 5 mg bid.  5. Possible small LV thrombus: Not seen on repeat echo in 6/20 or 11/22.  Eliquis as above. 6. Snoring: Had previous sleep study at [facility] in 2020, negative for OSA. 7. Obesity: Body mass index is 42.16 kg/m. - Needs weight loss. She asked about going to the gym. I think that would be fine, would avoid heavy lifting and keep HR <140-150.  Followup in 4-6 weeks with APP for further medication titration (add BiDil +/- SGLT2i).  Signed, Rafael Bihari, FNP  09/13/2021  [facility] [address] Heart [place] [address] [phone] (office) [phone] (fax)

## 2021-09-23 ENCOUNTER — Ambulatory Visit (HOSPITAL_COMMUNITY): Admission: RE | Admit: 2021-09-23 | Payer: 59 | Source: Ambulatory Visit

## 2021-10-03 ENCOUNTER — Other Ambulatory Visit: Payer: Self-pay

## 2021-10-03 ENCOUNTER — Ambulatory Visit (HOSPITAL_COMMUNITY)
Admission: RE | Admit: 2021-10-03 | Discharge: 2021-10-03 | Disposition: A | Discharge status: Home / Self Care | Payer: 59 | Source: Ambulatory Visit | Attending: Internal Medicine | Admitting: Internal Medicine

## 2021-10-03 DIAGNOSIS — I5042 Chronic combined systolic (congestive) and diastolic (congestive) heart failure: Secondary | ICD-10-CM | POA: Diagnosis not present

## 2021-10-03 LAB — BASIC METABOLIC PANEL
Anion gap: 6 (ref 5–15)
BUN: 9 mg/dL (ref 6–20)
CO2: 25 mmol/L (ref 22–32)
Calcium: 8.9 mg/dL (ref 8.9–10.3)
Chloride: 106 mmol/L (ref 98–111)
Creatinine, Ser: 0.95 mg/dL (ref 0.44–1.00)
GFR, Estimated: >60 mL/min (ref >60)
Glucose, Bld: 91 mg/dL (ref 70–99)
Potassium: 3.7 mmol/L (ref 3.5–5.1)
Sodium: 137 mmol/L (ref 135–145)

## 2021-10-14 ENCOUNTER — Other Ambulatory Visit (HOSPITAL_COMMUNITY): Payer: Self-pay

## 2021-10-17 ENCOUNTER — Other Ambulatory Visit (HOSPITAL_COMMUNITY): Payer: Self-pay

## 2021-10-18 ENCOUNTER — Other Ambulatory Visit (HOSPITAL_COMMUNITY): Payer: Self-pay

## 2021-10-18 MED ORDER — METOPROLOL SUCCINATE ER 100 MG PO TB24
100.0000 mg | ORAL_TABLET | Freq: Every day | ORAL | 1 refills | Status: DC
  Filled 2021-10-18 – 2021-11-07 (×2): qty 90, 90d supply, fill #0
  Filled 2022-03-20: qty 90, 90d supply, fill #1

## 2021-10-21 NOTE — Progress Notes (Incomplete)
Advanced Heart Failure Clinic Note  PCP:  Fanny Bien, MD  Cardiologist: Dr Aundra Dubin  Chief Complaint: Heart Failure Follow Up   History of Present Illness: [name] [name] is a 36 y.o. female with a history of pregnancy induced HTN, systolic HF with EF 45% on cMRI (12/2018), acute PE 12/2018 now on Eliquis, possible LV thrombus, and PAF on Eliquis.    Admitted 12/17/18 with 30 lb weight gain. CTA showed RLL PE with no obvious right heart strain. She was started on heparin and transitioned to Eliquis prior to discharge. Echo showed EF 20-25% with moderately reduced RV systolic function. AHF team consulted. She was diuresed with lasix drip and metolazone. Coronary CT showed no CAD. HF medications were optimized. cMRI showed EF 16% with possible LV thrombus, RV EF 21%, and LGE pattern that could be consistent with myocarditis. Co-ox was monitored and remained normal. She had an episode of atrial fibrillation that converted with IV amiodarone. She was sent home on amiodarone but is now off it.    Echo in 6/20 showed EF up to 60-65% with moderate LVH and normal RV size and systolic function.   She became pregnant 5/21 and she was instructed to stop spiro, Entresto and Eliquis; she stopped carvedilol and BiDil as well. She followed up with [facility] PharmD 6/21 and BP was 180s/110s, requiring clonidine. Carvedilol, BiDil and Lasix were restarted.  Unfortunately she suffered and ectopic pregnancy w/ hemoperitoneum and underwent left salpingectomy 6/21.  Lost to follow up. Ran out of HF meds about 6 months ago, saw PCP a couple weekly ago who started beta blocker and ARB.  Today she returns for HF follow up. Overall feeling fine. Denies increasing SOB, CP, dizziness, edema, or PND/Orthopnea. Appetite ok. No fever or chills. Has not checked weight recently, but usually runs 228-232 lbs. Taking all medications. 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 Lots of stress at home.  ECG  (personally reviewed): none ordered today.  PMH: 1. Pregnancy-induced HTN 2. Pulmonary embolus: RLL, 3/20.  3. Atrial fibrillation: Paroxysmal.  Episode noted during 3/20 hospitalization.  4. Chronic systolic CHF: Nonischemic cardiomyopathy.  - Echo (3/20) with EF 20-25%, moderately decreased RV systolic function.  - Cardiac MRI (3/20): EF 16% with possible LV thrombus, RV EF 21%, and LGE pattern that could be consistent with myocarditis. - Coronary CTA (3/20): No CAD - Echo (6/20): EF 60-65%, moderate LVH, normal RV size and systolic function - Echo (11/22): EF 35-40%, global HK, grade II DD, RV is low/normal,  5. Sleep study 6/20 showed no OSA.   Past Surgical History:  Procedure Laterality Date   BIRTH CONTROL IMPLANT Left 03/2016   "Nexplanon in my upper arm"   CESAREAN SECTION N/A 02/25/2016   Procedure: CESAREAN SECTION;  Surgeon: Truett Mainland, DO;  Location: [facility];  Service: Obstetrics;  Laterality: N/A;   LAPAROSCOPIC SALPINGO OOPHERECTOMY Left 04/11/2020   Procedure: LAPAROSCOPIC SALPINGECTOMY/Removal of Hemoperitoneum, lysis of adhesions;  Surgeon: Servando Salina, MD;  Location: [facility];  Service: Gynecology;  Laterality: Left;   Current Outpatient Medications  Medication Sig Dispense Refill   apixaban (ELIQUIS) 5 MG TABS tablet Take 1 tablet (5 mg total) by mouth 2 (two) times daily. 60 tablet 4   benzonatate (TESSALON) 100 MG capsule Take 1 capsule (100 mg total) by mouth 2 (two) times daily as needed for cough. 20 capsule 0   furosemide (LASIX) 20 MG tablet Take 1 tablet (20 mg total)  by mouth as needed. 30 tablet 3   metoprolol succinate (TOPROL-XL) 100 MG 24 hr tablet Take 1 tablet (100 mg total) by mouth daily. 90 tablet 1   sacubitril-valsartan (ENTRESTO) 49-51 MG Take 1 tablet by mouth 2 (two) times daily. 180 tablet 3   spironolactone (ALDACTONE) 25 MG tablet Take 1 tablet (25 mg total) by mouth daily. 90 tablet 1   No current facility-administered  medications for this visit.    Allergies:   Patient has no known allergies.   Social History:  The patient  reports that she has never smoked. She has never used smokeless tobacco. She reports that she does not drink alcohol and does not use drugs.   Family History:  The patient's family history includes Heart disease in her mother; Hypertension in her mother.   ROS:  Please see the history of present illness.   All other systems are personally reviewed and negative.   Recent Labs: 08/20/2021: Hemoglobin 13.1; Platelets 248; TSH 2.070 10/03/2021: BUN 9; Creatinine, Ser 0.95; Potassium 3.7; Sodium 137  Personally reviewed   Wt Readings from Last 3 Encounters:  09/13/21 108 kg (238 lb)  08/20/21 105.6 kg (232 lb 12.8 oz)  04/11/20 107 kg (236 lb)   There were no vitals taken for this visit.  Physical Exam: General:  NAD. No resp difficulty, tearful HEENT: Normal Neck: Supple. No JVD. Carotids 2+ bilat; no bruits. No lymphadenopathy or thryomegaly appreciated. Cor: PMI nondisplaced. Regular rate & rhythm. No rubs, gallops or murmurs. Lungs: Clear Abdomen: Obese, nontender, nondistended. No hepatosplenomegaly. No bruits or masses. Good bowel sounds. Extremities: No cyanosis, clubbing, rash, edema Neuro: Alert & oriented x 3, cranial nerves grossly intact. Moves all 4 extremities w/o difficulty. Affect pleasant.  ASSESSMENT AND PLAN: 1. Chronic systolic CHF: Nonischemic cardiomyopathy. EF was reportedly low on echo at hospital in [facility] in 12/19.  Echo in 3/20 showed EF 20-25% with moderate RV systolic dysfunction.  There had been no recent pregnancy so doubt peri-partum CMP.  No family history of CHF and coronary CTA showed no CAD.  Cardiac MRI showed LV EF 16% with possible small LV thrombus, RV EF 21%.  Delayed enhancement imaging showed non-coronary pattern LGE that could be consistent with myocarditis.  Repeat echo with medical therapy in 6/20 showed improvement in EF to  60-65%.  Unfortunately, she was lost to follow up and ran out of her medications x 6 months. Echo (11/22) showed EF down to 35-40%. We discussed these results today. Currently, NYHA class I-II symptoms, although not physically active. She does not appear volume overloaded on exam. - Stop losartan. - Start Entresto 49/51 mg bid. BMET today, repeat in 10 days. - Change Lasix 20 mg daily to PRN weight gain/edema. - Continue spironolactone 25 mg daily.  - Continue Toprol XL 100 mg daily.  - Add BiDil next. - Would benefit from SGLT2i. Recent A1c 5.5 - She is s/p Left salpingectomy 6/21. Discussed today about avoiding pregnancy with her uncontrolled hypertension and teratogenicity of HF medications. 2. HTN: Recent home readings 140-160s. - Start [place] as above. - Add BiDil next. - Instructed to continue to check BP at home and bring log to next visit. 3. Pulmonary embolus: Occurred in 3/20.  May have been triggered by low cardiac output/stasis of flow.  She has no family history of VTE.  - Continue Eliquis 5 mg bid. Will need to stop if she becomes pregnant.  4. Atrial fibrillation: Paroxysmal. Likely triggered by CHF and  PE. No palpitations.  Now off amiodarone. Regular on exam today. - CHA2DS2-VASc Score =  at least 3  - Continue Eliquis 5 mg bid.  5. Possible small LV thrombus: Not seen on repeat echo in 6/20 or 11/22.  Eliquis as above. 6. Snoring: Had previous sleep study at [facility] in 2020, negative for OSA. 7. Obesity: There is no height or weight on file to calculate BMI. - Needs weight loss. She asked about going to the gym. I think that would be fine, would avoid heavy lifting and keep HR <140-150.  Followup in 4-6 weeks with APP for further medication titration (add BiDil +/- SGLT2i).  Signed, Rafael Bihari, FNP  10/21/2021  [facility] [address] Heart and [place] [address] [phone] (office) [phone] (fax)

## 2021-10-23 ENCOUNTER — Telehealth: Payer: 59 | Admitting: Physician Assistant

## 2021-10-23 DIAGNOSIS — J029 Acute pharyngitis, unspecified: Secondary | ICD-10-CM

## 2021-10-23 NOTE — Progress Notes (Signed)
°  E-Visit for Sore Throat  We are sorry that you are not feeling well.  Here is how we plan to help!  Your symptoms indicate a likely viral infection (Pharyngitis).   Pharyngitis is inflammation in the back of the throat which can cause a sore throat, scratchiness and sometimes difficulty swallowing.   Pharyngitis is typically caused by a respiratory virus and will just run its course.  Please keep in mind that your symptoms could last up to 10 days.  For throat pain, we recommend over the counter oral pain relief medications such as acetaminophen or aspirin, or anti-inflammatory medications such as ibuprofen or naproxen sodium.  Topical treatments such as oral throat lozenges or sprays may be used as needed.  Avoid close contact with loved ones, especially the very young and elderly.  Remember to wash your hands thoroughly throughout the day as this is the number one way to prevent the spread of infection and wipe down door knobs and counters with disinfectant.  After careful review of your answers, I would not recommend an antibiotic for your condition.  Antibiotics should not be used to treat conditions that we suspect are caused by viruses like the virus that causes the common cold or flu. However, some people can have Strep with atypical symptoms. You may need formal testing in clinic or office to confirm if your symptoms continue or worsen.  Providers prescribe antibiotics to treat infections caused by bacteria. Antibiotics are very powerful in treating bacterial infections when they are used properly.  To maintain their effectiveness, they should be used only when necessary.  Overuse of antibiotics has resulted in the development of super bugs that are resistant to treatment!    Home Care: Only take medications as instructed by your medical team. Do not drink alcohol while taking these medications. A steam or ultrasonic humidifier can help congestion.  You can place a towel over your head and  breathe in the steam from hot water coming from a faucet. Avoid close contacts especially the very young and the elderly. Cover your mouth when you cough or sneeze. Always remember to wash your hands.  Get Help Right Away If: You develop worsening fever or throat pain. You develop a severe head ache or visual changes. Your symptoms persist after you have completed your treatment plan.  Make sure you Understand these instructions. Will watch your condition. Will get help right away if you are not doing well or get worse.   Thank you for choosing an e-visit.  Your e-visit answers were reviewed by a board certified advanced clinical practitioner to complete your personal care plan. Depending upon the condition, your plan could have included both over the counter or prescription medications.  Please review your pharmacy choice. Make sure the pharmacy is open so you can pick up prescription now. If there is a problem, you may contact your provider through [employer] and have the prescription routed to another pharmacy.  Your safety is important to [place]. If you have drug allergies check your prescription carefully.   For the next 24 hours you can use MyChart to ask questions about today's visit, request a non-urgent call back, or ask for a work or school excuse. You will get an email in the next two days asking about your experience. I hope that your e-visit has been valuable and will speed your recovery.

## 2021-10-23 NOTE — Progress Notes (Signed)
I have spent 5 minutes in review of e-visit questionnaire, review and updating patient chart, medical decision making and response to patient.   Mohmmad Saleeby Cody Brennen Gardiner, PA-C    

## 2021-10-25 ENCOUNTER — Encounter (HOSPITAL_COMMUNITY): Payer: 59

## 2021-10-29 ENCOUNTER — Other Ambulatory Visit (HOSPITAL_COMMUNITY): Payer: Self-pay

## 2021-11-07 ENCOUNTER — Other Ambulatory Visit (HOSPITAL_COMMUNITY): Payer: Self-pay

## 2021-11-08 ENCOUNTER — Other Ambulatory Visit (HOSPITAL_COMMUNITY): Payer: Self-pay

## 2021-11-08 ENCOUNTER — Encounter (HOSPITAL_COMMUNITY): Payer: Self-pay

## 2021-11-08 ENCOUNTER — Ambulatory Visit (HOSPITAL_COMMUNITY)
Admission: RE | Admit: 2021-11-08 | Discharge: 2021-11-08 | Disposition: A | Discharge status: Home / Self Care | Payer: 59 | Source: Ambulatory Visit | Attending: Family Medicine | Admitting: Family Medicine

## 2021-11-08 ENCOUNTER — Other Ambulatory Visit: Payer: Self-pay

## 2021-11-08 VITALS — BP 128/78 | HR 64 | Wt 240.6 lb

## 2021-11-08 DIAGNOSIS — R001 Bradycardia, unspecified: Secondary | ICD-10-CM | POA: Diagnosis not present

## 2021-11-08 DIAGNOSIS — Z8249 Family history of ischemic heart disease and other diseases of the circulatory system: Secondary | ICD-10-CM | POA: Diagnosis not present

## 2021-11-08 DIAGNOSIS — I10 Essential (primary) hypertension: Secondary | ICD-10-CM | POA: Diagnosis not present

## 2021-11-08 DIAGNOSIS — I428 Other cardiomyopathies: Secondary | ICD-10-CM | POA: Diagnosis not present

## 2021-11-08 DIAGNOSIS — R0683 Snoring: Secondary | ICD-10-CM | POA: Insufficient documentation

## 2021-11-08 DIAGNOSIS — I4891 Unspecified atrial fibrillation: Secondary | ICD-10-CM

## 2021-11-08 DIAGNOSIS — B3731 Acute candidiasis of vulva and vagina: Secondary | ICD-10-CM | POA: Insufficient documentation

## 2021-11-08 DIAGNOSIS — Z6841 Body Mass Index (BMI) 40.0 and over, adult: Secondary | ICD-10-CM | POA: Insufficient documentation

## 2021-11-08 DIAGNOSIS — I5042 Chronic combined systolic (congestive) and diastolic (congestive) heart failure: Secondary | ICD-10-CM

## 2021-11-08 DIAGNOSIS — Z7901 Long term (current) use of anticoagulants: Secondary | ICD-10-CM | POA: Insufficient documentation

## 2021-11-08 DIAGNOSIS — Z79899 Other long term (current) drug therapy: Secondary | ICD-10-CM | POA: Insufficient documentation

## 2021-11-08 DIAGNOSIS — I5022 Chronic systolic (congestive) heart failure: Secondary | ICD-10-CM | POA: Diagnosis not present

## 2021-11-08 DIAGNOSIS — Z86711 Personal history of pulmonary embolism: Secondary | ICD-10-CM | POA: Diagnosis not present

## 2021-11-08 DIAGNOSIS — I110 Hypertensive heart disease with heart failure: Secondary | ICD-10-CM | POA: Insufficient documentation

## 2021-11-08 DIAGNOSIS — Z9079 Acquired absence of other genital organ(s): Secondary | ICD-10-CM | POA: Insufficient documentation

## 2021-11-08 DIAGNOSIS — I480 Paroxysmal atrial fibrillation: Secondary | ICD-10-CM | POA: Insufficient documentation

## 2021-11-08 DIAGNOSIS — E669 Obesity, unspecified: Secondary | ICD-10-CM | POA: Insufficient documentation

## 2021-11-08 LAB — BASIC METABOLIC PANEL
Anion gap: 7 (ref 5–15)
BUN: 11 mg/dL (ref 6–20)
CO2: 26 mmol/L (ref 22–32)
Calcium: 8.9 mg/dL (ref 8.9–10.3)
Chloride: 104 mmol/L (ref 98–111)
Creatinine, Ser: 1.01 mg/dL — ABNORMAL HIGH (ref 0.44–1.00)
GFR, Estimated: >60 mL/min (ref >60)
Glucose, Bld: 115 mg/dL — ABNORMAL HIGH (ref 70–99)
Potassium: 4.1 mmol/L (ref 3.5–5.1)
Sodium: 137 mmol/L (ref 135–145)

## 2021-11-08 LAB — BRAIN NATRIURETIC PEPTIDE: B Natriuretic Peptide: 50.6 pg/mL (ref 0.0–100.0)

## 2021-11-08 MED ORDER — ENTRESTO 97-103 MG PO TABS
1.0000 | ORAL_TABLET | Freq: Two times a day (BID) | ORAL | 4 refills | Status: DC
  Filled 2021-11-08 – 2022-02-21 (×3): qty 60, 30d supply, fill #0
  Filled 2022-07-16: qty 60, 30d supply, fill #1
  Filled 2022-10-30: qty 60, 30d supply, fill #2

## 2021-11-08 NOTE — Patient Instructions (Signed)
Thank you for coming in today  Labs were done today (BMET,BNP), if any labs are abnormal the clinic will call you  Your physician recommends that you return for lab work in:  10-14 days for BMET    INCREASE Entresto to 97/103 mg 1 tablet twice daily  Your physician recommends that you schedule a follow-up appointment in: keep follow up with Dr. Aundra Dubin  At the [facility], you and your health needs are our priority. As part of our continuing mission to provide you with exceptional heart care, we have created designated Provider Care Teams. These Care Teams include your primary Cardiologist (physician) and Advanced Practice Providers (APPs- Physician Assistants and Nurse Practitioners) who all work together to provide you with the care you need, when you need it.   You may see any of the following providers on your designated Care Team at your next follow up: Dr Glori Bickers Dr Haynes Kerns, NP Lyda Jester, [place] [facility] [place], [place] Audry Riles, PharmD   Please be sure to bring in all your medications bottles to every appointment.    If you have any questions or concerns before your next appointment please send [place] a message through [place] or call our office at [phone].    TO LEAVE A MESSAGE FOR THE NURSE SELECT OPTION 2, PLEASE LEAVE A MESSAGE INCLUDING: YOUR NAME DATE OF BIRTH CALL BACK NUMBER REASON FOR CALL**this is important as we prioritize the call backs  YOU WILL RECEIVE A CALL BACK THE SAME DAY AS LONG AS YOU CALL BEFORE 4:00 PM

## 2021-11-08 NOTE — Addendum Note (Signed)
Encounter addended by: Rafael Bihari, FNP on: 11/08/2021 4:36 PM  Actions taken: Visit diagnoses modified

## 2021-11-08 NOTE — Progress Notes (Signed)
Advanced [facility] Failure Clinic Note  PCP:  Fanny Bien, MD  Cardiologist: Dr Aundra Dubin  Chief Complaint: Heart Failure Follow Up   History of Present Illness: [name] [name] is a 36 y.o. female with a history of pregnancy induced HTN, systolic HF with EF 85% on cMRI (12/2018), acute PE 12/2018 now on Eliquis, possible LV thrombus, and PAF on Eliquis.    Admitted 12/17/18 with 30 lb weight gain. CTA showed RLL PE with no obvious right heart strain. She was started on heparin and transitioned to Eliquis prior to discharge. Echo showed EF 20-25% with moderately reduced RV systolic function. AHF team consulted. She was diuresed with lasix drip and metolazone. Coronary CT showed no CAD. HF medications were optimized. cMRI showed EF 16% with possible LV thrombus, RV EF 21%, and LGE pattern that could be consistent with myocarditis. Co-ox was monitored and remained normal. She had an episode of atrial fibrillation that converted with IV amiodarone. She was sent home on amiodarone but is now off it.    Echo in 6/20 showed EF up to 60-65% with moderate LVH and normal RV size and systolic function.   She became pregnant 5/21 and she was instructed to stop spiro, Entresto and Eliquis; she stopped carvedilol and BiDil as well. She followed up with AHF PharmD 6/21 and BP was 180s/110s, requiring clonidine. Carvedilol, BiDil and Lasix were restarted.  Unfortunately she suffered and ectopic pregnancy w/ hemoperitoneum and underwent left salpingectomy 6/21.  Lost to follow up. Ran out of HF meds about 6 months ago (~02/2021).  Seen in clinic 11/22, BP elevated and GDMT restarted.  Today she returns for HF follow up. Overall feeling fine. She does not have significant exertional dyspnea. Denies abnormal bleeding, palpitations, CP, dizziness, edema, or PND/Orthopnea. Appetite ok. No fever or chills. Taking all medications. Takes Lasix about once a week. Having yeast infection/vaginitis symptoms during her  menstrual cycles, plans to follow up with her GYN soon.  ECG (personally reviewed): NSR 75 bpm  PMH: 1. Pregnancy-induced HTN 2. Pulmonary embolus: RLL, 3/20.  3. Atrial fibrillation: Paroxysmal.  Episode noted during 3/20 hospitalization.  4. Chronic systolic CHF: Nonischemic cardiomyopathy.  - Echo (3/20) with EF 20-25%, moderately decreased RV systolic function.  - Cardiac MRI (3/20): EF 16% with possible LV thrombus, RV EF 21%, and LGE pattern that could be consistent with myocarditis. - Coronary CTA (3/20): No CAD - Echo (6/20): EF 60-65%, moderate LVH, normal RV size and systolic function - Echo (11/22): EF 35-40%, global HK, grade II DD, RV is low/normal,  5. Sleep study 6/20 showed no OSA.   Past Surgical History:  Procedure Laterality Date   BIRTH CONTROL IMPLANT Left 03/2016   "Nexplanon in my upper arm"   CESAREAN SECTION N/A 02/25/2016   Procedure: CESAREAN SECTION;  Surgeon: Truett Mainland, DO;  Location: [place];  Service: Obstetrics;  Laterality: N/A;   LAPAROSCOPIC SALPINGO OOPHERECTOMY Left 04/11/2020   Procedure: LAPAROSCOPIC SALPINGECTOMY/Removal of Hemoperitoneum, lysis of adhesions;  Surgeon: Servando Salina, MD;  Location: [place];  Service: Gynecology;  Laterality: Left;   Current Outpatient Medications  Medication Sig Dispense Refill   apixaban (ELIQUIS) 5 MG TABS tablet Take 1 tablet (5 mg total) by mouth 2 (two) times daily. 60 tablet 4   benzonatate (TESSALON) 100 MG capsule Take 1 capsule (100 mg total) by mouth 2 (two) times daily as needed for cough. 20 capsule 0   furosemide (LASIX) 20 MG tablet Take 1  tablet (20 mg total) by mouth as needed. 30 tablet 3   metoprolol succinate (TOPROL-XL) 100 MG 24 hr tablet Take 1 tablet (100 mg total) by mouth daily. 90 tablet 1   sacubitril-valsartan (ENTRESTO) 49-51 MG Take 1 tablet by mouth 2 (two) times daily. 180 tablet 3   spironolactone (ALDACTONE) 25 MG tablet Take 1 tablet (25 mg total) by mouth  daily. 90 tablet 1   No current facility-administered medications for this encounter.    Allergies:   Patient has no known allergies.   Social History:  The patient  reports that she has never smoked. She has never used smokeless tobacco. She reports that she does not drink alcohol and does not use drugs.   Family History:  The patient's family history includes Heart disease in her mother; Hypertension in her mother.   ROS:  Please see the history of present illness.   All other systems are personally reviewed and negative.   Recent Labs: 08/20/2021: Hemoglobin 13.1; Platelets 248; TSH 2.070 10/03/2021: BUN 9; Creatinine, Ser 0.95; Potassium 3.7; Sodium 137  Personally reviewed   Wt Readings from Last 3 Encounters:  11/08/21 109.1 kg (240 lb 9.6 oz)  09/13/21 108 kg (238 lb)  08/20/21 105.6 kg (232 lb 12.8 oz)   BP 128/78    Pulse 64    Wt 109.1 kg (240 lb 9.6 oz)    SpO2 97%    BMI 42.62 kg/m   Physical Exam: General:  NAD. No resp difficulty HEENT: Normal Neck: Supple. No JVD. Carotids 2+ bilat; no bruits. No lymphadenopathy or thryomegaly appreciated. Cor: PMI nondisplaced. Regular rate & rhythm. No rubs, gallops or murmurs. Lungs: Clear Abdomen: Obese,nontender, nondistended. No hepatosplenomegaly. No bruits or masses. Good bowel sounds. Extremities: No cyanosis, clubbing, rash, edema Neuro: Alert & oriented x 3, cranial nerves grossly intact. Moves all 4 extremities w/o difficulty. Affect pleasant.  ASSESSMENT AND PLAN: 1. Chronic systolic CHF: Nonischemic cardiomyopathy. EF was reportedly low on echo at hospital in [facility] in 12/19.  Echo in 3/20 showed EF 20-25% with moderate RV systolic dysfunction.  There had been no recent pregnancy so doubt peri-partum CMP.  No family history of CHF and coronary CTA showed no CAD.  Cardiac MRI showed LV EF 16% with possible small LV thrombus, RV EF 21%.  Delayed enhancement imaging showed non-coronary pattern LGE that could be  consistent with myocarditis.  Repeat echo with medical therapy in 6/20 showed improvement in EF to 60-65%.  Unfortunately, she was lost to follow up and ran out of her medications x 6 months. Echo (11/22) showed EF down to 35-40%. We discussed these results today. Currently, NYHA class I-II symptoms, although not physically active. She does not appear volume overloaded on exam. - With possible yeast, hold off on SGLT2i for now. We discussed this for future, once she sees her GYN. Recent A1c 5.5 - Increase Entresto to 97/103 mg bid. BMET/BNP today, repeat BMET in 10-14 days. - Continue Lasix 20 mg daily PRN weight gain/edema. - Continue spironolactone 25 mg daily.  - Continue Toprol XL 100 mg daily.  - Add BiDil next. - She is s/p Left salpingectomy 6/21. Discussed today about avoiding pregnancy with her uncontrolled hypertension and teratogenicity of HF medications. 2. HTN: Much improved. Increase Entresto as above. - Add BiDil next if BP not at goal at follow up. 3. Pulmonary embolus: Occurred in 3/20.  May have been triggered by low cardiac output/stasis of flow.  She has no family  history of VTE.  - Continue Eliquis 5 mg bid. Will need to stop if she becomes pregnant.  4. Atrial fibrillation: Paroxysmal. Likely triggered by CHF and PE. No palpitations.  Now off amiodarone. Regular on exam today. - CHA2DS2-VASc Score =  at least 3  - Continue Eliquis 5 mg bid. No abnormal bleeding. 5. Possible small LV thrombus: Not seen on repeat echo in 6/20 or 11/22.  Eliquis as above. 6. Snoring: Had previous sleep study at [facility] in 2020, negative for OSA. 7. Obesity: Body mass index is 42.62 kg/m. - Needs weight loss. Could consider referral to pharmacy for semaglutide.  Followup with Dr. Aundra Dubin as scheduled next month with echo.  Signed, Rafael Bihari, FNP  11/08/2021  [facility] [address] Heart and [place] [address] [phone]  (office) [phone] (fax)

## 2021-11-18 ENCOUNTER — Other Ambulatory Visit (HOSPITAL_COMMUNITY): Payer: Self-pay

## 2021-11-22 ENCOUNTER — Other Ambulatory Visit: Payer: Self-pay

## 2021-11-22 ENCOUNTER — Ambulatory Visit (HOSPITAL_COMMUNITY)
Admission: RE | Admit: 2021-11-22 | Discharge: 2021-11-22 | Disposition: A | Discharge status: Home / Self Care | Payer: 59 | Source: Ambulatory Visit | Attending: Internal Medicine | Admitting: Internal Medicine

## 2021-11-22 DIAGNOSIS — I5042 Chronic combined systolic (congestive) and diastolic (congestive) heart failure: Secondary | ICD-10-CM | POA: Insufficient documentation

## 2021-11-22 LAB — BASIC METABOLIC PANEL
Anion gap: 7 (ref 5–15)
BUN: 11 mg/dL (ref 6–20)
CO2: 23 mmol/L (ref 22–32)
Calcium: 8.9 mg/dL (ref 8.9–10.3)
Chloride: 108 mmol/L (ref 98–111)
Creatinine, Ser: 1.02 mg/dL — ABNORMAL HIGH (ref 0.44–1.00)
GFR, Estimated: >60 mL/min (ref >60)
Glucose, Bld: 99 mg/dL (ref 70–99)
Potassium: 4.1 mmol/L (ref 3.5–5.1)
Sodium: 138 mmol/L (ref 135–145)

## 2021-12-06 ENCOUNTER — Other Ambulatory Visit (HOSPITAL_COMMUNITY): Payer: Self-pay

## 2021-12-09 ENCOUNTER — Other Ambulatory Visit: Payer: Self-pay

## 2021-12-09 ENCOUNTER — Ambulatory Visit (HOSPITAL_BASED_OUTPATIENT_CLINIC_OR_DEPARTMENT_OTHER)
Admission: RE | Admit: 2021-12-09 | Discharge: 2021-12-09 | Disposition: A | Discharge status: Home / Self Care | Payer: 59 | Source: Ambulatory Visit | Attending: Cardiology | Admitting: Cardiology

## 2021-12-09 ENCOUNTER — Ambulatory Visit (HOSPITAL_COMMUNITY)
Admission: RE | Admit: 2021-12-09 | Discharge: 2021-12-09 | Disposition: A | Discharge status: Home / Self Care | Payer: 59 | Source: Ambulatory Visit | Attending: Internal Medicine | Admitting: Internal Medicine

## 2021-12-09 ENCOUNTER — Other Ambulatory Visit (HOSPITAL_COMMUNITY): Payer: Self-pay

## 2021-12-09 ENCOUNTER — Encounter (HOSPITAL_COMMUNITY): Payer: Self-pay | Admitting: Cardiology

## 2021-12-09 VITALS — BP 140/88 | HR 68 | Wt 246.0 lb

## 2021-12-09 DIAGNOSIS — Z6841 Body Mass Index (BMI) 40.0 and over, adult: Secondary | ICD-10-CM | POA: Diagnosis not present

## 2021-12-09 DIAGNOSIS — Z7901 Long term (current) use of anticoagulants: Secondary | ICD-10-CM | POA: Diagnosis not present

## 2021-12-09 DIAGNOSIS — I428 Other cardiomyopathies: Secondary | ICD-10-CM | POA: Diagnosis not present

## 2021-12-09 DIAGNOSIS — I5022 Chronic systolic (congestive) heart failure: Secondary | ICD-10-CM | POA: Diagnosis not present

## 2021-12-09 DIAGNOSIS — I4891 Unspecified atrial fibrillation: Secondary | ICD-10-CM | POA: Diagnosis not present

## 2021-12-09 DIAGNOSIS — I480 Paroxysmal atrial fibrillation: Secondary | ICD-10-CM | POA: Diagnosis not present

## 2021-12-09 DIAGNOSIS — E669 Obesity, unspecified: Secondary | ICD-10-CM | POA: Insufficient documentation

## 2021-12-09 DIAGNOSIS — Z86711 Personal history of pulmonary embolism: Secondary | ICD-10-CM | POA: Insufficient documentation

## 2021-12-09 DIAGNOSIS — I2699 Other pulmonary embolism without acute cor pulmonale: Secondary | ICD-10-CM | POA: Insufficient documentation

## 2021-12-09 DIAGNOSIS — Z79899 Other long term (current) drug therapy: Secondary | ICD-10-CM | POA: Diagnosis not present

## 2021-12-09 DIAGNOSIS — I110 Hypertensive heart disease with heart failure: Secondary | ICD-10-CM | POA: Insufficient documentation

## 2021-12-09 DIAGNOSIS — I5042 Chronic combined systolic (congestive) and diastolic (congestive) heart failure: Secondary | ICD-10-CM | POA: Diagnosis not present

## 2021-12-09 LAB — ECHOCARDIOGRAM COMPLETE
AR max vel: 1.43 cm2
AV Peak grad: 13 mmHg
Ao pk vel: 1.8 m/s
Area-P 1/2: 2.17 cm2
S' Lateral: 4.1 cm

## 2021-12-09 MED ORDER — ISOSORB DINITRATE-HYDRALAZINE 20-37.5 MG PO TABS
1.0000 | ORAL_TABLET | Freq: Three times a day (TID) | ORAL | 11 refills | Status: DC
  Filled 2021-12-09: qty 90, 30d supply, fill #0
  Filled 2022-03-10: qty 90, 30d supply, fill #1
  Filled 2022-04-30: qty 90, 30d supply, fill #2

## 2021-12-09 NOTE — Progress Notes (Signed)
°  Echocardiogram 2D Echocardiogram has been performed.  [name] [name] M 12/09/2021, 11:39 AM

## 2021-12-09 NOTE — Progress Notes (Signed)
Advanced Heart Failure Clinic Note  PCP:  Fanny Bien, MD  Cardiologist: Dr Aundra Dubin  Chief Complaint: Heart Failure Follow Up   History of Present Illness: [name] [name] is a 36 y.o. female with a history of pregnancy induced HTN, systolic HF with EF 47% on cMRI (12/2018), acute PE 12/2018 now on Eliquis, possible LV thrombus, and PAF on Eliquis.    Admitted 12/17/18 with 30 lb weight gain. CTA showed RLL PE with no obvious right heart strain. She was started on heparin and transitioned to Eliquis prior to discharge. Echo showed EF 20-25% with moderately reduced RV systolic function. AHF team consulted. She was diuresed with lasix drip and metolazone. Coronary CT showed no CAD. HF medications were optimized. cMRI showed EF 16% with possible LV thrombus, RV EF 21%, and LGE pattern that could be consistent with myocarditis. Co-ox was monitored and remained normal. She had an episode of atrial fibrillation that converted with IV amiodarone. She was sent home on amiodarone but is now off it.    Echo in 6/20 showed EF up to 60-65% with moderate LVH and normal RV size and systolic function.   She became pregnant 5/21 and she was instructed to stop spiro, Entresto and Eliquis; she stopped carvedilol and BiDil as well. She followed up with AHF PharmD 6/21 and BP was 180s/110s, requiring clonidine. Carvedilol, BiDil and Lasix were restarted.  Unfortunately she suffered and ectopic pregnancy w/ hemoperitoneum and underwent left salpingectomy 6/21.  Lost to follow up. Ran out of HF meds in 02/2021.  Seen back in clinic 11/22, BP elevated and GDMT restarted.  Echo in 11/22 (off meds) with EF 35-40%.   Echo was done today and reviewed, EF 45-50% with mild LVH, normal RV, mild MR, IVC normal.   Today she returns for HF follow up. She says that she is taking all her meds.  She is taking Lasix about once a week.  She denies exertional dyspnea and can walk up a flight of stairs without dyspnea.  She  is exercising very little.  No palpitations.  No lightheadedness.   Labs (2/23): K 4.1, creatinine 1.02.   PMH: 1. Pregnancy-induced HTN 2. Pulmonary embolus: RLL, 3/20.  3. Atrial fibrillation: Paroxysmal.  Episode noted during 3/20 hospitalization.  4. Chronic systolic CHF: Nonischemic cardiomyopathy.  - Echo (3/20) with EF 20-25%, moderately decreased RV systolic function.  - Cardiac MRI (3/20): EF 16% with possible LV thrombus, RV EF 21%, and LGE pattern that could be consistent with myocarditis. - Coronary CTA (3/20): No CAD - Echo (6/20): EF 60-65%, moderate LVH, normal RV size and systolic function - Echo (11/22): EF 35-40%, global HK, grade II DD, RV is low/normal - Echo (2/23): EF 45-50% with mild LVH, normal RV, mild MR, IVC normal. 5. Sleep study 6/20 showed no OSA.   Past Surgical History:  Procedure Laterality Date   BIRTH CONTROL IMPLANT Left 03/2016   "Nexplanon in my upper arm"   CESAREAN SECTION N/A 02/25/2016   Procedure: CESAREAN SECTION;  Surgeon: Truett Mainland, DO;  Location: [place];  Service: Obstetrics;  Laterality: N/A;   LAPAROSCOPIC SALPINGO OOPHERECTOMY Left 04/11/2020   Procedure: LAPAROSCOPIC SALPINGECTOMY/Removal of Hemoperitoneum, lysis of adhesions;  Surgeon: Servando Salina, MD;  Location: [place];  Service: Gynecology;  Laterality: Left;   Current Outpatient Medications  Medication Sig Dispense Refill   apixaban (ELIQUIS) 5 MG TABS tablet Take 1 tablet (5 mg total) by mouth 2 (two) times daily. [place]  tablet 4   benzonatate (TESSALON) 100 MG capsule Take 1 capsule (100 mg total) by mouth 2 (two) times daily as needed for cough. 20 capsule 0   furosemide (LASIX) 20 MG tablet Take 1 tablet (20 mg total) by mouth as needed. 30 tablet 3   isosorbide-hydrALAZINE (BIDIL) 20-37.5 MG tablet Take 1 tablet by mouth 3 (three) times daily. 90 tablet 11   metoprolol succinate (TOPROL-XL) 100 MG 24 hr tablet Take 1 tablet (100 mg total) by mouth daily. 90  tablet 1   sacubitril-valsartan (ENTRESTO) 97-103 MG Take 1 tablet by mouth 2 (two) times daily. 60 tablet 4   spironolactone (ALDACTONE) 25 MG tablet Take 1 tablet (25 mg total) by mouth daily. 90 tablet 1   No current facility-administered medications for this encounter.    Allergies:   Patient has no known allergies.   Social History:  The patient  reports that she has never smoked. She has never used smokeless tobacco. She reports that she does not drink alcohol and does not use drugs.   Family History:  The patient's family history includes Heart disease in her mother; Hypertension in her mother.   ROS:  Please see the history of present illness.   All other systems are personally reviewed and negative.   Recent Labs: 08/20/2021: Hemoglobin 13.1; Platelets 248; TSH 2.070 11/08/2021: B Natriuretic Peptide 50.6 11/22/2021: BUN 11; Creatinine, Ser 1.02; Potassium 4.1; Sodium 138  Personally reviewed   Wt Readings from Last 3 Encounters:  12/09/21 111.6 kg (246 lb)  11/08/21 109.1 kg (240 lb 9.6 oz)  09/13/21 108 kg (238 lb)   BP 140/88    Pulse 68    Wt 111.6 kg (246 lb)    SpO2 99%    BMI 43.58 kg/m   Physical Exam: General: NAD Neck: No JVD, no thyromegaly or thyroid nodule.  Lungs: Clear to auscultation bilaterally with normal respiratory effort. CV: Nondisplaced PMI.  Heart regular S1/S2, no S3/S4, no murmur.  No peripheral edema.  No carotid bruit.  Normal pedal pulses.  Abdomen: Soft, nontender, no hepatosplenomegaly, no distention.  Skin: Intact without lesions or rashes.  Neurologic: Alert and oriented x 3.  Psych: Normal affect. Extremities: No clubbing or cyanosis.  HEENT: Normal.   ASSESSMENT AND PLAN: 1. Chronic systolic CHF: Nonischemic cardiomyopathy. EF was reportedly low on echo at hospital in [facility] in 12/19.  Echo in 3/20 showed EF 20-25% with moderate RV systolic dysfunction.  There had been no recent pregnancy so doubt peri-partum CMP.  No family  history of CHF and coronary CTA showed no CAD.  Cardiac MRI showed LV EF 16% with possible small LV thrombus, RV EF 21%.  Delayed enhancement imaging showed non-coronary pattern LGE that could be consistent with myocarditis.  Repeat echo with medical therapy in 6/20 showed improvement in EF to 60-65%.  Unfortunately, she was lost to follow up and ran out of her medications x 6 months. Echo off meds (11/22) showed EF down to 35-40%. She is now back on meds, echo done today showed EF up to 45-50%. Currently, NYHA class I-II symptoms, not volume overloaded on exam.  - She has had frequent yeast infections, hold off on SGLT2i for now.  - Continue Entresto 97/103 mg bid. Recent BMET with stable creatinine.   - Continue Lasix 20 mg daily PRN weight gain/edema. - Continue spironolactone 25 mg daily.  - Continue Toprol XL 100 mg daily.  - Add Bidil 1 tab tid.  - We  again discussed avoiding pregnancy given teratogenicity of HF medications. - EF is out of ICD range.  Hopefully will continue to improve with medical therapy.  2. HTN: Improved but still too high.  - Add Bidil as above. 3. Pulmonary embolus: Occurred in 3/20.  May have been triggered by low cardiac output/stasis of flow.  She has no family history of VTE.  - Continue Eliquis 5 mg bid.  4. Atrial fibrillation: Paroxysmal. Likely triggered by CHF and PE. No palpitations.  Now off amiodarone. Regular on exam today. - Continue Eliquis 5 mg bid. No abnormal bleeding. 5. Possible small LV thrombus: Not seen on more recent echoes.  Eliquis as above. 6. Snoring: Had previous sleep study at [facility] in 2020, negative for OSA. 7. Obesity: Body mass index is 43.58 kg/m. She is having difficulty with weight loss.  - Refer to pharmacy clinic for semaglutide.   Followup in 3 months.   Signed, Loralie Champagne, MD  12/09/2021  [facility] [address] Heart and [place] [address] [phone] (office) [phone]  (fax)

## 2021-12-09 NOTE — Patient Instructions (Signed)
Thank you for your visit today.  Start Bidil 1 Tab Three times a day  Your provider has referred you to the [facility] pharmacist for Semaglutide. They will call you to arrange your appointment.  Your physician recommends that you schedule a follow-up appointment in: 3 months  If you have any questions or concerns before your next appointment please send [place] a message through [place] or call our office at [phone].    TO LEAVE A MESSAGE FOR THE NURSE SELECT OPTION 2, PLEASE LEAVE A MESSAGE INCLUDING: YOUR NAME DATE OF BIRTH CALL BACK NUMBER REASON FOR CALL**this is important as we prioritize the call backs  YOU WILL RECEIVE A CALL BACK THE SAME DAY AS LONG AS YOU CALL BEFORE 4:00 PM  At the [facility], you and your health needs are our priority. As part of our continuing mission to provide you with exceptional heart care, we have created designated Provider Care Teams. These Care Teams include your primary Cardiologist (physician) and Advanced Practice Providers (APPs- Physician Assistants and Nurse Practitioners) who all work together to provide you with the care you need, when you need it.   You may see any of the following providers on your designated Care Team at your next follow up: Dr Glori Bickers Dr Haynes Kerns, NP Lyda Jester, [place] [facility] [place], [place] Audry Riles, PharmD   Please be sure to bring in all your medications bottles to every appointment.

## 2021-12-10 ENCOUNTER — Other Ambulatory Visit (HOSPITAL_COMMUNITY): Payer: Self-pay

## 2021-12-10 ENCOUNTER — Telehealth: Payer: Self-pay | Admitting: Student-PharmD

## 2021-12-10 NOTE — Telephone Encounter (Signed)
Received referral from Dr. Aundra Dubin to start semaglutide for this patient for weight loss. They meet FDA approved criteria for semaglutide for use in obesity given BMI 43.58. Obesity is complicated by chronic conditions including HTN, HF. No contraindications seen in the chart to [facility] use.   Submitted PA for [facility] 12/10/21.

## 2021-12-13 NOTE — Telephone Encounter (Signed)
Received prior authorization approval for [facility] but only for 3 months and for a maximum of 3 fills through 03/12/22. ?

## 2021-12-17 NOTE — Telephone Encounter (Signed)
Called patient to discuss interest in starting [facility] but unable to reach. LVM requesting call back.  ?

## 2021-12-24 ENCOUNTER — Other Ambulatory Visit (HOSPITAL_COMMUNITY): Payer: Self-pay

## 2021-12-24 MED ORDER — WEGOVY 0.25 MG/0.5ML ~~LOC~~ SOAJ
0.2500 mg | SUBCUTANEOUS | 0 refills | Status: DC
  Filled 2021-12-24: qty 2, 28d supply, fill #0

## 2021-12-24 NOTE — Telephone Encounter (Signed)
Called and was able to speak with patient about her interest in starting [facility]. Discussed administration, storage, benefits and risks of the medication. Confirmed no personal or family history of medullary thyroid carcinoma. She is interested in starting treatment. Scheduled for initial visit on 01/21/22 and sent Rx for Wegovy 0.25 mg weekly. Explained that she can sign up for a copay card to bring the cost to $0/month.  ?

## 2022-01-06 ENCOUNTER — Other Ambulatory Visit (HOSPITAL_COMMUNITY): Payer: Self-pay

## 2022-01-21 ENCOUNTER — Ambulatory Visit: Payer: 59

## 2022-01-30 ENCOUNTER — Other Ambulatory Visit (HOSPITAL_COMMUNITY): Payer: Self-pay

## 2022-02-07 ENCOUNTER — Other Ambulatory Visit (HOSPITAL_COMMUNITY): Payer: Self-pay

## 2022-02-12 ENCOUNTER — Other Ambulatory Visit (HOSPITAL_COMMUNITY): Payer: Self-pay

## 2022-02-12 NOTE — Progress Notes (Deleted)
Patient ID: [name] [name]                 DOB: [birth year]                    MRN: [id number] ? ? ? ? ?HPI: ?[name] Gilliand is a 36 y.o. female patient referred to pharmacy clinic by Dr Aundra Dubin to initiate weight loss therapy with GLP1-RA. PMH is significant for pregnancy-induced HTN, systolic HF, PE 0/1601 and afib now on Eliquis, possible LV thrombus, and obesity. Most recent BMI 43.5. ? ?[facility] prior authorization was approved in February for 3 months through 03/12/22. Appt initially scheduled with PharmD on 4/11, then moved to today's date by pt. ? ?Confirm no future/current pregnancy while on med ? ?Current weight management medications: none ? ?Previously tried meds: ? ?Current meds that may affect weight: none ? ?Baseline weight/BMI: ? ?Insurance payor: [employer] ? ?Diet:  ?-Breakfast: ?-Lunch: ?-Dinner: ?-Snacks: ?-Drinks: ? ?Exercise:  ? ?Family History: Heart disease in her mother; Hypertension in her mother.  ? ?Social History: The patient  reports that she has never smoked. She has never used smokeless tobacco. She reports that she does not drink alcohol and does not use drugs.  ? ?Labs: ?Lab Results  ?Component Value Date  ? HGBA1C 5.5 08/20/2021  ? ? ?Wt Readings from Last 1 Encounters:  ?12/09/21 246 lb (111.6 kg)  ? ? ?BP Readings from Last 1 Encounters:  ?12/09/21 140/88  ? ?Pulse Readings from Last 1 Encounters:  ?12/09/21 68  ? ? ?   ?Component Value Date/Time  ? CHOL 193 11/04/2016 0926  ? TRIG 103 11/04/2016 0926  ? HDL 36 (L) 11/04/2016 0926  ? CHOLHDL 5.4 (H) 11/04/2016 0932  ? LDLCALC 136 (H) 11/04/2016 3557  ? ? ?Past Medical History:  ?Diagnosis Date  ? Combined systolic and diastolic CHF   ? EF 16 by cMRI in 12/2018  ? Condyloma acuminata   ? Dysrhythmia   ? a-fib  ? HTN in pregnancy, chronic 09/24/2015  ? [place] labs: [x ]Cr, _0  24hr protein (208) P:Cr 92 Plat: 207 AST/ALT: 10/10    ? Hx of RLL Pulmonary Embolism 12/2018 12/17/2018  ? Hypertension   ? LV (left ventricular) mural thrombus 02/03/2019   ? Nonischemic cardiomyopathy 02/03/2019  ? Echo 12/2018: EF 20-25, Gr 2 DD // cMRI 12/2018:  EF 16, poss LV clot // Cor CTA 12/2018: no sig CAD  ? Obesity in pregnancy   ? Antepartum, first trimester  ? Paroxysmal atrial fibrillation ([place]) 02/03/2019  ? AF occurred during hosp for PE/CHF/Pneumonia 12/2018 >> Amiod continued for 1 mo post DC, then DC'd  ? ? ?Current Outpatient Medications on File Prior to Visit  ?Medication Sig Dispense Refill  ? apixaban (ELIQUIS) 5 MG TABS tablet Take 1 tablet (5 mg total) by mouth 2 (two) times daily. 60 tablet 4  ? benzonatate (TESSALON) 100 MG capsule Take 1 capsule (100 mg total) by mouth 2 (two) times daily as needed for cough. 20 capsule 0  ? furosemide (LASIX) 20 MG tablet Take 1 tablet (20 mg total) by mouth as needed. 30 tablet 3  ? isosorbide-hydrALAZINE (BIDIL) 20-37.5 MG tablet Take 1 tablet by mouth 3 (three) times daily. 90 tablet 11  ? metoprolol succinate (TOPROL-XL) 100 MG 24 hr tablet Take 1 tablet (100 mg total) by mouth daily. 90 tablet 1  ? sacubitril-valsartan (ENTRESTO) 97-103 MG Take 1 tablet by mouth 2 (two) times daily. 60 tablet 4  ?  spironolactone (ALDACTONE) 25 MG tablet Take 1 tablet (25 mg total) by mouth daily. 90 tablet 1  ? WEGOVY 0.25 MG/0.5ML SOAJ Inject 0.25 mg into the skin once a week. 2 mL 0  ? ?No current facility-administered medications on file prior to visit.  ? ? ?No Known Allergies ? ? ?Assessment/Plan: ? ?1. Weight loss - Patient has not met goal of at least 5% of body weight loss with comprehensive lifestyle modifications alone in the past 3-6 months. Pharmacotherapy is appropriate to pursue as augmentation. Will start ***. Confirmed patient not ***pregnant and no personal or family history of medullary thyroid carcinoma (MTC) or Multiple Endocrine Neoplasia syndrome type 2 (MEN 2).  ? ?Advised patient on common side effects including nausea, diarrhea, dyspepsia, decreased appetite, and fatigue. Counseled patient on reducing meal size and  how to titrate medication to minimize side effects. Counseled patient to call if intolerable side effects or if experiencing dehydration, abdominal pain, or dizziness. Patient will adhere to dietary modifications and will target at least 150 minutes of moderate intensity exercise weekly.  ? ?Injection technique reviewed at today's visit and patient successfully self-administered first dose of *** into the fatty tissue of the abdomen. ? ?Titration Plan:  ?Will plan to follow the titration plan as below, pending patient is tolerating each dose before increasing to the next. Can slow titration if needed for tolerability.  ?  ?-Month 1: Inject *** SQ once weekly x 4 weeks ?-Month 2: Inject *** SQ once weekly x 4 weeks ?-Month 3: Inject *** SQ once weekly x 4 weeks ?-Month 4+: Inject *** SQ once weekly  ? ?Follow up in ***. ? ?

## 2022-02-13 ENCOUNTER — Ambulatory Visit: Payer: 59 | Admitting: Pharmacist

## 2022-02-13 DIAGNOSIS — Z6841 Body Mass Index (BMI) 40.0 and over, adult: Secondary | ICD-10-CM | POA: Diagnosis not present

## 2022-02-13 NOTE — Progress Notes (Signed)
Patient ID: [name] [name]                 DOB: Oct 31, 1985                    MRN: [id number] ? ? ? ? ?HPI: ?[name] Botz is a 36 y.o. female patient referred to pharmacy clinic by Dr. Aundra Dubin to initiate weight loss therapy with GLP1-RA. PMH is significant for obesity, pregnancy induced HTN, systolic HF with EF 65% on cMRI (12/2018), acute PE 12/2018 now on Eliquis, possible LV thrombus, and PAF on Eliquis.. Most recent BMI 44.6. ? ?Patient presents today to [facility] clinic. She admits that her diet is poor and full on convenient food, often door dashing fast food. She snacks on chips, but also nuts. She does not exercise. She has lost weight in the past by improving diet and exercising. Needs to find the motivation to do so. She works from home. Has a 52 year old son who goes to school during the day. Her husband works on the road. Drinks 4-5 cans of soda per day. ? ?Current weight management medications: none ? ?Previously tried meds: none ? ?Current meds that may affect weight: furosemide ? ?Baseline weight/BMI:252lb/44.6 ? ?Insurance payor: [employer] ? ?Diet:  ?-Breakfast: nothing ?-Lunch: bowl of cereal, sausage sandwich, door dash ?-Dinner: bowl of cereal, sausage sandwich, door dash (burger and fries) ?-Snacks: chips, sunflower seeds, almonds, cashwes ?-Drinks: soda 4-5 cans per day, ICE water, water ? ?Exercise: none ? ?Family History: The patient's family history includes Heart disease in her mother; Hypertension in her mother.  ? ?Social History:  The patient  reports that she has never smoked. She has never used smokeless tobacco. She reports that she does not drink alcohol and does not use drugs ? ?Labs: ?Lab Results  ?Component Value Date  ? HGBA1C 5.5 08/20/2021  ? ? ?Wt Readings from Last 1 Encounters:  ?12/09/21 246 lb (111.6 kg)  ? ? ?BP Readings from Last 1 Encounters:  ?12/09/21 140/88  ? ?Pulse Readings from Last 1 Encounters:  ?12/09/21 68  ? ? ?   ?Component Value Date/Time  ? CHOL 193 11/04/2016  0926  ? TRIG 103 11/04/2016 0926  ? HDL 36 (L) 11/04/2016 0926  ? CHOLHDL 5.4 (H) 11/04/2016 7846  ? LDLCALC 136 (H) 11/04/2016 9629  ? ? ?Past Medical History:  ?Diagnosis Date  ? Combined systolic and diastolic CHF   ? EF 16 by cMRI in 12/2018  ? Condyloma acuminata   ? Dysrhythmia   ? a-fib  ? HTN in pregnancy, chronic 09/24/2015  ? [place] labs: [x ]Cr, [x] 24hr protein (208) P:Cr 92 Plat: 207 AST/ALT: 10/10    ? Hx of RLL Pulmonary Embolism 12/2018 12/17/2018  ? Hypertension   ? LV (left ventricular) mural thrombus 02/03/2019  ? Nonischemic cardiomyopathy 02/03/2019  ? Echo 12/2018: EF 20-25, Gr 2 DD // cMRI 12/2018:  EF 16, poss LV clot // Cor CTA 12/2018: no sig CAD  ? Obesity in pregnancy   ? Antepartum, first trimester  ? Paroxysmal atrial fibrillation ([place]) 02/03/2019  ? AF occurred during hosp for PE/CHF/Pneumonia 12/2018 >> Amiod continued for 1 mo post DC, then DC'd  ? ? ?Current Outpatient Medications on File Prior to Visit  ?Medication Sig Dispense Refill  ? apixaban (ELIQUIS) 5 MG TABS tablet Take 1 tablet (5 mg total) by mouth 2 (two) times daily. 60 tablet 4  ? benzonatate (TESSALON) 100 MG capsule Take 1  capsule (100 mg total) by mouth 2 (two) times daily as needed for cough. 20 capsule 0  ? furosemide (LASIX) 20 MG tablet Take 1 tablet (20 mg total) by mouth as needed. 30 tablet 3  ? isosorbide-hydrALAZINE (BIDIL) 20-37.5 MG tablet Take 1 tablet by mouth 3 (three) times daily. 90 tablet 11  ? metoprolol succinate (TOPROL-XL) 100 MG 24 hr tablet Take 1 tablet (100 mg total) by mouth daily. 90 tablet 1  ? sacubitril-valsartan (ENTRESTO) 97-103 MG Take 1 tablet by mouth 2 (two) times daily. 60 tablet 4  ? spironolactone (ALDACTONE) 25 MG tablet Take 1 tablet (25 mg total) by mouth daily. 90 tablet 1  ? WEGOVY 0.25 MG/0.5ML SOAJ Inject 0.25 mg into the skin once a week. 2 mL 0  ? ?No current facility-administered medications on file prior to visit.  ? ? ?No Known Allergies ? ? ?Assessment/Plan: ? ?1. Weight  loss - Patient has not met goal of at least 5% of body weight loss with comprehensive lifestyle modifications alone in the past 3-6 months. Pharmacotherapy is appropriate to pursue as augmentation. Will start Wegovy 0.44m weekly. Confirmed patient not pregnant and no personal or family history of medullary thyroid carcinoma (MTC) or Multiple Endocrine Neoplasia syndrome type 2 (MEN 2). Np hx of pancreatitis or gallstones, no hx of GI issues. ? ?Advised patient on common side effects including nausea, diarrhea, dyspepsia, decreased appetite, and fatigue. Counseled patient on reducing meal size and how to titrate medication to minimize side effects. Counseled patient to call if intolerable side effects or if experiencing dehydration, abdominal pain, or dizziness. ? ?We discussed diet in detail. I strongly encouraged her to make a menu and consider meal prepping to avoid ordering fast food. We talked about a diet high in vegetables, nuts, seeds, beans, legumes, some fruit and some lean meat protein. Suggested making batches of things like soups, t[place]meatballs, homemade tomato sauce and freezing them so she has them when she needs something to eat. I gave her the link to [facility] We mutually set a goal of reducing soda intake to 2-3 cans per day by next month. She also set an exercise goal of exercising 30 min per day 3 times a week by next month. She is going to try walking during her lunch break. We discussed at least a moderate 5/10 intensity, HR up, breathing up pace.  ? ?We had a long discussion that the ultimate goal is to make her healthier, regardless of her end weight. We have to change what she is eating and increase her exercise in order to accomplish this. We discussed eating real food, avoiding ultra processed foods (if it has a long list of ingredients or it has ingredients in it that she does have in her kitchen- don't eat it), not eating fast food and working towards no  soda. She knows this is a process and him here to help her. Small goals were set and we will set new goals next month. ? ?Injection technique reviewed at today's visit and patient successfully self-administered first dose of Wegovy 0.251minto the fatty tissue of the abdomen. ? ?Titration Plan:  ?Will plan to follow the titration plan as below, pending patient is tolerating each dose before increasing to the next. Can slow titration if needed for tolerability.  ?  ?- Month 1: Inject 0.2558mubcutaneously once weekly for 4 weeks ?- Month 2: Inject 0.5 subcutaneously once weekly for 4 weeks ?- Month 3: Inject  81m subcutaneously once weekly for 4 weeks ?- Month 4: Inject 1.780msubcutaneously once weekly for 4 weeks ?-Month 5: Inject 2.52m92mubcutaneously once a week for 4 weeks ? ?Follow up in 1 month via telephone and 3 months in person. ? ?

## 2022-02-13 NOTE — Patient Instructions (Addendum)
Goal # 1 - decrease soda intake to 2-3 per day by next month ?Goal # 2- Exercise goal 30 min 3 times per week ? ?Try to meal prep or make a menu to avoid fast food ? ?GLP-1 Receptor Agonist Counseling Points ?This medication reduces your appetite and may make you feel fuller longer.  ?Stop eating when your body tells you that you are full. This will likely happen sooner than you are used to. ?Store your medication in the fridge until you are ready to use it. ?Inject your medication in the fatty tissue of your lower abdominal area (2 inches away from belly button) or upper outer thigh. Rotate injection sites. ?Each pen will last you about 1 month (the first month it will last a few weeks longer). Use a different needle with each weekly injection. ?Common side effects include: nausea, diarrhea/constipation, and heartburn, and are more likely to occur if you overeat. ? ?Dosing schedule: ?- Month 1: Inject 0.'25mg'$  subcutaneously once weekly for 4 weeks ?- Month 2: Inject 0.5 subcutaneously once weekly for 4 weeks ?- Month 3: Inject '1mg'$  subcutaneously once weekly for 4 weeks ?- Month 4: Inject 1.'7mg'$  subcutaneously once weekly for 4 weeks ?-Month 5: Inject 2.'4mg'$  subcutaneously once a week for 4 weeks ? ?Tips for living a healthier life ? ? ? ? ?Building a Naval architect Diet ?Make most of your meal vegetables and fruits - ? of your plate. ?Aim for color and variety, and remember that potatoes don?t count as vegetables on the Healthy Eating Plate because of their negative impact on blood sugar. ? ?Go for whole grains - ? of your plate. ?Whole and intact grains--whole wheat, barley, wheat berries, quinoa, oats, brown rice, and foods made with them, such as whole wheat pasta--have a milder effect on blood sugar and insulin than white bread, white rice, and other refined grains. ? ?Protein power - ? of your plate. ?Fish, poultry, beans, and nuts are all healthy, versatile protein sources--they can be mixed into salads,  and pair well with vegetables on a plate. Limit red meat, and avoid processed meats such as bacon and sausage. ? ?Healthy plant oils - in moderation. ?Choose healthy vegetable oils like olive, canola, soy, corn, sunflower, peanut, and others, and avoid partially hydrogenated oils, which contain unhealthy trans fats. Remember that low-fat does not mean ?healthy.? ? ?Drink water, coffee, or tea. ?Skip sugary drinks, limit milk and dairy products to one to two servings per day, and limit juice to a small glass per day. ? ?Stay active. ?The red figure running across the [place] is a reminder that staying active is also important in weight control. ? ?The main message of the Healthy Eating Plate is to focus on diet quality: ? ?The type of carbohydrate in the diet is more important than the amount of carbohydrate in the diet, because some sources of carbohydrate--like vegetables (other than potatoes), fruits, whole grains, and beans--are healthier than others. ?The Healthy Eating Plate also advises consumers to avoid sugary beverages, a major source of calories--usually with little nutritional value--in the American diet. ?The Healthy Eating Plate encourages consumers to use healthy oils, and it does not set a maximum on the percentage of calories people should get each day from healthy sources of fat. In this way, the Healthy Eating Plate recommends the opposite of the low-fat message promoted for decades by the USDA. ? ?[email or web address] ? ?SUGAR ? ?Sugar is a huge problem in the modern day diet.  Sugar is a big contributor to heart disease, diabetes, high triglyceride levels, fatty liver disease and obesity. Sugar is hidden in almost all packaged foods/beverages. Added sugar is extra sugar that is added beyond what is naturally found and has no nutritional benefit for your body. The American Heart Association recommends limiting added sugars to no  more than 25g for women and 36 grams for men per day. There are many names for sugar including maltose, sucrose (names ending in "ose"), high fructose corn syrup, molasses, cane sugar, corn sweetener, raw sugar, syrup, honey or fruit juice concentrate.  ? ?One of the best ways to limit your added sugars is to stop drinking sweetened beverages such as soda, sweet tea, and fruit juice. ? ?There is 65g of added sugars in one 20oz bottle of Coke! That is equal to 7.5 donuts.  ? ?Pay attention and read all nutrition facts labels. Below is an examples of a nutrition facts label. The #1 is showing you the total sugars where the # 2 is showing you the added sugars. This one serving has almost the max amount of added sugars per day! ? ? ? ? ?20 oz Soda ?65g Sugar = 7.5 Glazed Donuts ? ?16oz Energy  ?Drink ?54g Sugar = 6.5 Glazed Donuts ? ?Large Sweet  ?Tea ?38g Sugar = 4 Glazed Donuts ? ?20oz Sports  ?Drink ?34g Sugar = 3.5 Glazed Donuts ? ?8oz Chocolate Milk ?24g Sugar =2.5 Glazed Donuts ? ?8oz Orange  ?Juice ?21g Sugar = 2 Glazed Donuts ? ?1 Juice Box ?14g Sugar = 1.5 Glazed Donuts ? ?16oz Water= NO SUGAR!! ? ?EXERCISE ? ?Exercise is good. We?ve all heard that. In an ideal world, we would all have time and resources to get plenty of it. When you are active, your heart pumps more efficiently and you will feel better.  Multiple studies show that even walking regularly has benefits that include living a longer life. The American Heart Association recommends 150 minutes per week of exercise (30 minutes per day most days of the week). You can do this in any increment you wish. Nine or more 10-minute walks count. So does an hour-long exercise class. Break the time apart into what will work in your life. Some of the best things you can do include walking briskly, jogging, cycling or swimming laps. Not everyone is ready to ?exercise.? Sometimes we need to start with just getting active. Here are some easy ways to be more active  throughout the day: ? Take the stairs instead of the elevator ? Go for a 10-15 minute walk during your lunch break (find a friend to make it more enjoyable) ? When shopping, park at the back of the parking lot ? If you take public transportation, get off one stop early and walk the extra distance ? Pace around while making phone calls ? ?Check with your doctor if you aren?t sure what your limitations may be. Always remember to drink plenty of water when doing any type of exercise. Don?t feel like a failure if you?re not getting the 90-150 minutes per week. If you started by being a couch potato, then just a 10-minute walk each day is a huge improvement. Start with little victories and work your way up. ? ? ?HEALTHY EATING TIPS ? ?When looking to improve your eating habits, whether to lose weight, lower blood pressure or just be healthier, it helps to know what a serving size is.  ? ?Grains ?1 slice of bread, ? bagel, ?  cup pasta or rice  Vegetables ?1 cup fresh or raw vegetables, ? cup cooked or canned ?Fruits ?1 piece of medium sized fruit, ? cup canned,   Meats/Proteins ?? cup dried       1 oz meat, 1 egg, ? cup cooked beans, nuts or seeds ? ?Dairy        Fats ?Individual yogurt container, 1 cup (8oz)    1 teaspoon margarine/butter or vegetable  ?milk or milk alternative, 1 slice of cheese          oil; 1 tablespoon mayonnaise or salad dressing                 ? ?Plan ahead: make a menu of the meals for a week then create a grocery list to go with that menu. Consider meals that easily stretch into a night of leftovers, such as stews or casseroles. Or consider making two of your favorite meal and put one in the freezer for another night. Try a night or two each week that is ?meatless? or ?no cook? such as salads. When you get home from the grocery store wash and prepare your vegetables and fruits. Then when you need them they are ready to go.  ? ?Tips for going to the grocery store: ? [employer] store or generic brands ?  Check the weekly ad from your store on-line or in their in-store flyer ? Look at the unit price on the shelf tag to compare/contrast the costs of different items ? Buy fruits/vegetables in season ? Melton Alar

## 2022-02-19 ENCOUNTER — Other Ambulatory Visit (HOSPITAL_COMMUNITY): Payer: Self-pay

## 2022-02-20 ENCOUNTER — Other Ambulatory Visit (HOSPITAL_COMMUNITY): Payer: Self-pay

## 2022-02-21 ENCOUNTER — Other Ambulatory Visit (HOSPITAL_COMMUNITY): Payer: Self-pay

## 2022-03-10 ENCOUNTER — Other Ambulatory Visit: Payer: Self-pay | Admitting: Cardiology

## 2022-03-11 ENCOUNTER — Other Ambulatory Visit (HOSPITAL_COMMUNITY): Payer: Self-pay

## 2022-03-11 ENCOUNTER — Encounter (HOSPITAL_COMMUNITY): Payer: 59

## 2022-03-12 ENCOUNTER — Other Ambulatory Visit (HOSPITAL_COMMUNITY): Payer: Self-pay

## 2022-03-12 ENCOUNTER — Encounter: Payer: Self-pay | Admitting: Pharmacist

## 2022-03-12 MED ORDER — WEGOVY 0.5 MG/0.5ML ~~LOC~~ SOAJ
0.5000 mg | SUBCUTANEOUS | 0 refills | Status: DC
  Filled 2022-03-12: qty 2, 28d supply, fill #0

## 2022-03-14 ENCOUNTER — Other Ambulatory Visit (HOSPITAL_COMMUNITY): Payer: Self-pay

## 2022-03-14 MED ORDER — UNIFINE PENTIPS 32G X 6 MM MISC
1.0000 | Freq: Every day | 0 refills | Status: DC
  Filled 2022-03-14: qty 100, 90d supply, fill #0

## 2022-03-14 MED ORDER — SAXENDA 18 MG/3ML ~~LOC~~ SOPN
3.0000 mg | PEN_INJECTOR | Freq: Every day | SUBCUTANEOUS | 0 refills | Status: DC
  Filled 2022-03-14: qty 15, 30d supply, fill #0

## 2022-03-20 ENCOUNTER — Other Ambulatory Visit (HOSPITAL_COMMUNITY): Payer: Self-pay

## 2022-04-14 ENCOUNTER — Encounter (HOSPITAL_COMMUNITY): Payer: 59

## 2022-04-23 ENCOUNTER — Telehealth: Payer: Self-pay | Admitting: Pharmacist

## 2022-04-23 NOTE — Telephone Encounter (Signed)
Called pt. Phone went to VM. Calling to see how patient was doing on Saxenda. If she is still taking and if she wants to swap back to [facility]. Previous mychart messages were sent but patient did not respond.

## 2022-04-23 NOTE — Telephone Encounter (Signed)
Spoke with patient. She is still at the 0.'6mg'$  dose of Saxenda. Tried to increase but got sick. We discussed trying a dose in betweeen 0.6 and 1.2 and working her way up that way. Has decreased Soda intake and really decreased her fast food intake. Eating less. I will follow up with her in another few weeks.

## 2022-04-30 ENCOUNTER — Other Ambulatory Visit (HOSPITAL_COMMUNITY): Payer: Self-pay

## 2022-04-30 ENCOUNTER — Other Ambulatory Visit (HOSPITAL_COMMUNITY): Payer: Self-pay | Admitting: Family Medicine

## 2022-04-30 DIAGNOSIS — I5042 Chronic combined systolic (congestive) and diastolic (congestive) heart failure: Secondary | ICD-10-CM

## 2022-04-30 MED ORDER — SPIRONOLACTONE 25 MG PO TABS
25.0000 mg | ORAL_TABLET | Freq: Every day | ORAL | 3 refills | Status: DC
  Filled 2022-04-30: qty 90, 90d supply, fill #0
  Filled 2022-10-30: qty 90, 90d supply, fill #1
  Filled 2023-02-04: qty 90, 90d supply, fill #2

## 2022-05-01 ENCOUNTER — Other Ambulatory Visit (HOSPITAL_COMMUNITY): Payer: Self-pay

## 2022-05-02 ENCOUNTER — Other Ambulatory Visit (HOSPITAL_COMMUNITY): Payer: Self-pay

## 2022-05-03 ENCOUNTER — Other Ambulatory Visit (HOSPITAL_COMMUNITY): Payer: Self-pay

## 2022-05-08 ENCOUNTER — Ambulatory Visit: Payer: 59

## 2022-05-16 ENCOUNTER — Encounter: Payer: Self-pay | Admitting: Pharmacist

## 2022-05-29 ENCOUNTER — Encounter (HOSPITAL_COMMUNITY): Payer: 59

## 2022-06-06 ENCOUNTER — Ambulatory Visit: Payer: 59

## 2022-06-06 DIAGNOSIS — D259 Leiomyoma of uterus, unspecified: Secondary | ICD-10-CM | POA: Diagnosis not present

## 2022-06-06 DIAGNOSIS — R102 Pelvic and perineal pain: Secondary | ICD-10-CM | POA: Diagnosis not present

## 2022-06-23 ENCOUNTER — Other Ambulatory Visit: Payer: Self-pay | Admitting: Obstetrics & Gynecology

## 2022-06-23 DIAGNOSIS — N979 Female infertility, unspecified: Secondary | ICD-10-CM

## 2022-06-27 ENCOUNTER — Other Ambulatory Visit (HOSPITAL_COMMUNITY): Payer: Self-pay

## 2022-06-27 MED ORDER — TRANEXAMIC ACID 650 MG PO TABS
1300.0000 mg | ORAL_TABLET | Freq: Three times a day (TID) | ORAL | 3 refills | Status: DC | PRN
  Filled 2022-06-27: qty 30, 5d supply, fill #0

## 2022-06-30 ENCOUNTER — Other Ambulatory Visit (HOSPITAL_COMMUNITY): Payer: Self-pay

## 2022-06-30 ENCOUNTER — Ambulatory Visit
Admission: RE | Admit: 2022-06-30 | Discharge: 2022-06-30 | Disposition: A | Discharge status: Home / Self Care | Payer: 59 | Source: Ambulatory Visit | Attending: Obstetrics & Gynecology | Admitting: Obstetrics & Gynecology

## 2022-06-30 ENCOUNTER — Encounter (HOSPITAL_COMMUNITY): Payer: Self-pay

## 2022-06-30 ENCOUNTER — Ambulatory Visit (HOSPITAL_COMMUNITY)
Admission: RE | Admit: 2022-06-30 | Discharge: 2022-06-30 | Disposition: A | Discharge status: Home / Self Care | Payer: 59 | Source: Ambulatory Visit | Attending: Family Medicine | Admitting: Family Medicine

## 2022-06-30 VITALS — BP 147/128 | HR 79 | Ht 63.5 in | Wt 241.0 lb

## 2022-06-30 DIAGNOSIS — I480 Paroxysmal atrial fibrillation: Secondary | ICD-10-CM | POA: Insufficient documentation

## 2022-06-30 DIAGNOSIS — I10 Essential (primary) hypertension: Secondary | ICD-10-CM | POA: Diagnosis not present

## 2022-06-30 DIAGNOSIS — I110 Hypertensive heart disease with heart failure: Secondary | ICD-10-CM | POA: Insufficient documentation

## 2022-06-30 DIAGNOSIS — I5022 Chronic systolic (congestive) heart failure: Secondary | ICD-10-CM | POA: Insufficient documentation

## 2022-06-30 DIAGNOSIS — Z7901 Long term (current) use of anticoagulants: Secondary | ICD-10-CM | POA: Diagnosis not present

## 2022-06-30 DIAGNOSIS — R0683 Snoring: Secondary | ICD-10-CM | POA: Insufficient documentation

## 2022-06-30 DIAGNOSIS — Z86711 Personal history of pulmonary embolism: Secondary | ICD-10-CM | POA: Insufficient documentation

## 2022-06-30 DIAGNOSIS — N979 Female infertility, unspecified: Secondary | ICD-10-CM

## 2022-06-30 DIAGNOSIS — E669 Obesity, unspecified: Secondary | ICD-10-CM | POA: Diagnosis not present

## 2022-06-30 DIAGNOSIS — I4891 Unspecified atrial fibrillation: Secondary | ICD-10-CM

## 2022-06-30 DIAGNOSIS — Z6841 Body Mass Index (BMI) 40.0 and over, adult: Secondary | ICD-10-CM | POA: Diagnosis not present

## 2022-06-30 DIAGNOSIS — I428 Other cardiomyopathies: Secondary | ICD-10-CM | POA: Insufficient documentation

## 2022-06-30 DIAGNOSIS — Z79899 Other long term (current) drug therapy: Secondary | ICD-10-CM | POA: Diagnosis not present

## 2022-06-30 DIAGNOSIS — D259 Leiomyoma of uterus, unspecified: Secondary | ICD-10-CM | POA: Diagnosis not present

## 2022-06-30 DIAGNOSIS — Z90722 Acquired absence of ovaries, bilateral: Secondary | ICD-10-CM | POA: Diagnosis not present

## 2022-06-30 DIAGNOSIS — N971 Female infertility of tubal origin: Secondary | ICD-10-CM | POA: Diagnosis not present

## 2022-06-30 LAB — BASIC METABOLIC PANEL
Anion gap: 8 (ref 5–15)
BUN: 9 mg/dL (ref 6–20)
CO2: 25 mmol/L (ref 22–32)
Calcium: 9 mg/dL (ref 8.9–10.3)
Chloride: 107 mmol/L (ref 98–111)
Creatinine, Ser: 0.95 mg/dL (ref 0.44–1.00)
GFR, Estimated: >60 mL/min (ref >60)
Glucose, Bld: 109 mg/dL — ABNORMAL HIGH (ref 70–99)
Potassium: 3.6 mmol/L (ref 3.5–5.1)
Sodium: 140 mmol/L (ref 135–145)

## 2022-06-30 MED ORDER — ISOSORB DINITRATE-HYDRALAZINE 20-37.5 MG PO TABS
1.5000 | ORAL_TABLET | Freq: Three times a day (TID) | ORAL | 6 refills | Status: DC
  Filled 2022-06-30: qty 135, 30d supply, fill #0
  Filled 2022-07-09: qty 136, 30d supply, fill #0
  Filled 2023-02-04: qty 136, 30d supply, fill #1

## 2022-06-30 NOTE — Progress Notes (Addendum)
Advanced Heart Failure Clinic Note  PCP:  Fanny Bien, MD  Cardiologist: Dr Aundra Dubin  Chief Complaint: Heart Failure Follow Up   History of Present Illness: [name] [name] is a 36 y.o. female with a history of pregnancy induced HTN, systolic HF with EF 08% on cMRI (12/2018), acute PE 12/2018 now on Eliquis, possible LV thrombus, and PAF on Eliquis.    Admitted 12/17/18 with 30 lb weight gain. CTA showed RLL PE with no obvious right heart strain. She was started on heparin and transitioned to Eliquis prior to discharge. Echo showed EF 20-25% with moderately reduced RV systolic function. AHF team consulted. She was diuresed with lasix drip and metolazone. Coronary CT showed no CAD. HF medications were optimized. cMRI showed EF 16% with possible LV thrombus, RV EF 21%, and LGE pattern that could be consistent with myocarditis. Co-ox was monitored and remained normal. She had an episode of atrial fibrillation that converted with IV amiodarone. She was sent home on amiodarone but is now off it.    Echo in 6/20 showed EF up to 60-65% with moderate LVH and normal RV size and systolic function.   She became pregnant 5/21 and she was instructed to stop spiro, Entresto and Eliquis; she stopped carvedilol and BiDil as well. She followed up with AHF PharmD 6/21 and BP was 180s/110s, requiring clonidine. Carvedilol, BiDil and Lasix were restarted.  Unfortunately she suffered and ectopic pregnancy w/ hemoperitoneum and underwent left salpingectomy 6/21.  Lost to follow up. Ran out of HF meds in 02/2021.  Seen back in clinic 11/22, BP elevated and GDMT restarted.  Echo in 11/22 (off meds) with EF 35-40%.   Echo 2/23 EF 45-50% with mild LVH, normal RV, mild MR, IVC normal.   Today she returns for HF follow up. Overall feeling fine. She does not have dyspnea with walking on flat ground or ADLs. Denies abnormal bleeding, palpitations, CP, dizziness, edema, or PND/Orthopnea. Appetite ok. No fever or  chills. She does not weigh regularly at home. Taking all medications. BP today 162/92, rarely takes Lasix. Wants to plan on getting pregnant in 6 months or so. Not currently on contraception, practicing abstinence.   Labs (2/23): K 4.1, creatinine 1.02.   PMH: 1. Pregnancy-induced HTN 2. Pulmonary embolus: RLL, 3/20.  3. Atrial fibrillation: Paroxysmal.  Episode noted during 3/20 hospitalization.  4. Chronic systolic CHF: Nonischemic cardiomyopathy.  - Echo (3/20) with EF 20-25%, moderately decreased RV systolic function.  - Cardiac MRI (3/20): EF 16% with possible LV thrombus, RV EF 21%, and LGE pattern that could be consistent with myocarditis. - Coronary CTA (3/20): No CAD - Echo (6/20): EF 60-65%, moderate LVH, normal RV size and systolic function - Echo (11/22): EF 35-40%, global HK, grade II DD, RV is low/normal - Echo (2/23): EF 45-50% with mild LVH, normal RV, mild MR, IVC normal. 5. Sleep study 6/20 showed no OSA.   Past Surgical History:  Procedure Laterality Date   BIRTH CONTROL IMPLANT Left 03/2016   "Nexplanon in my upper arm"   CESAREAN SECTION N/A 02/25/2016   Procedure: CESAREAN SECTION;  Surgeon: Truett Mainland, DO;  Location: [facility];  Service: Obstetrics;  Laterality: N/A;   LAPAROSCOPIC SALPINGO OOPHERECTOMY Left 04/11/2020   Procedure: LAPAROSCOPIC SALPINGECTOMY/Removal of Hemoperitoneum, lysis of adhesions;  Surgeon: Servando Salina, MD;  Location: [facility];  Service: Gynecology;  Laterality: Left;   Current Outpatient Medications  Medication Sig Dispense Refill   apixaban (ELIQUIS) 5 MG TABS  tablet Take 1 tablet (5 mg total) by mouth 2 (two) times daily. 60 tablet 4   furosemide (LASIX) 20 MG tablet Take 1 tablet (20 mg total) by mouth as needed. 30 tablet 3   Insulin Pen Needle (UNIFINE PENTIPS) 32G X 6 MM MISC Use as directed with Saxenda. 100 each 0   isosorbide-hydrALAZINE (BIDIL) 20-37.5 MG tablet Take 1 tablet by mouth 3 (three) times daily. 90  tablet 11   Liraglutide -Weight Management (SAXENDA) 18 MG/3ML SOPN Inject 3 mg into the skin daily. 15 mL 0   metoprolol succinate (TOPROL-XL) 100 MG 24 hr tablet Take 1 tablet (100 mg total) by mouth daily. 90 tablet 1   sacubitril-valsartan (ENTRESTO) 97-103 MG Take 1 tablet by mouth 2 (two) times daily. 60 tablet 4   spironolactone (ALDACTONE) 25 MG tablet Take 1 tablet (25 mg total) by mouth daily. 90 tablet 3   tranexamic acid (LYSTEDA) 650 MG TABS tablet Take 2 tablets (1,300 mg total) by mouth 3 (three) times daily as needed on days with a heavy period. Use for up to 5 days at a time. 30 tablet 3   No current facility-administered medications for this encounter.   Allergies:   Patient has no known allergies.   Social History:  The patient  reports that she has never smoked. She has never used smokeless tobacco. She reports that she does not drink alcohol and does not use drugs.   Family History:  The patient's family history includes Heart disease in her mother; Hypertension in her mother. Mother died of MI at 32, brother had MI  ROS:  Please see the history of present illness.   All other systems are personally reviewed and negative.   Recent Labs: 08/20/2021: Hemoglobin 13.1; Platelets 248; TSH 2.070 11/08/2021: B Natriuretic Peptide 50.6 11/22/2021: BUN 11; Creatinine, Ser 1.02; Potassium 4.1; Sodium 138  Personally reviewed   Wt Readings from Last 3 Encounters:  06/30/22 109.3 kg (241 lb)  02/13/22 114.3 kg (252 lb)  12/09/21 111.6 kg (246 lb)   BP (!) 147/128   Pulse 79   Ht 5' 3.5" (1.613 m)   Wt 109.3 kg (241 lb)   SpO2 99%   BMI 42.02 kg/m   Physical Exam: General:  NAD. No resp difficulty HEENT: Normal Neck: Supple. Thick neck. Carotids 2+ bilat; no bruits. No lymphadenopathy or thryomegaly appreciated. Cor: PMI nondisplaced. Regular rate & rhythm. No rubs, gallops or murmurs. Lungs: Clear Abdomen: Obese, soft, nontender, nondistended. No hepatosplenomegaly. No  bruits or masses. Good bowel sounds. Extremities: No cyanosis, clubbing, rash, edema Neuro: Alert & oriented x 3, cranial nerves grossly intact. Moves all 4 extremities w/o difficulty. Affect pleasant.  Assessment & Plan: 1. Chronic systolic CHF: Nonischemic cardiomyopathy. EF was reportedly low on echo at hospital in [facility] in 12/19.  Echo in 3/20 showed EF 20-25% with moderate RV systolic dysfunction.  There had been no recent pregnancy so doubt peri-partum CMP.  No family history of CHF and coronary CTA showed no CAD.  Cardiac MRI showed LV EF 16% with possible small LV thrombus, RV EF 21%.  Delayed enhancement imaging showed non-coronary pattern LGE that could be consistent with myocarditis.  Repeat echo with medical therapy in 6/20 showed improvement in EF to 60-65%.  Unfortunately, she was lost to follow up and ran out of her medications x 6 months. Echo off meds (11/22) showed EF down to 35-40%. She is now back on meds, echo 2/23 showed EF up to  45-50%. Currently, NYHA class I-II symptoms, not volume overloaded on exam.  - Increase BiDil to 1.5 tab tid for better BP control. - Continue Entresto 97/103 mg bid. BMET today. - Continue spironolactone 25 mg daily.  - Continue Toprol XL 100 mg daily.  - Continue Lasix 20 mg daily PRN weight gain/edema. - She has had frequent yeast infections, hold off on SGLT2i for now.  - We again discussed avoiding pregnancy given teratogenicity of HF medications, however she would like to start planning a pregnancy in the future. Her BP remains too high and her heart failure makes a future pregnancy high-risk from a cardiac perspective. Discussed with Dr. Aundra Dubin, refer to MFM. If she understands the associated health risks and wishes to proceed with trying for a pregnancy, will need close follow up in AHF clinic and will need to stop all current GDMT; and switch to labetalol, hydral/nitrate, +/- CCB. - EF is out of ICD range.  Hopefully will continue to  improve with medical therapy.  2. HTN: Improved but still too high. - Increase BiDil as above. - I asked her to check BP at home and log. Notify clinic if sBP remains > 140, after taking vaso-active medications. 3. Pulmonary embolus: Occurred in 3/20.  May have been triggered by low cardiac output/stasis of flow.  She has no family history of VTE.  - Continue Eliquis 5 mg bid.  4. Atrial fibrillation: Paroxysmal. Likely triggered by CHF and PE. No palpitations.  Now off amiodarone. Regular on exam today. ChadsVasc at least 3. - Continue Eliquis 5 mg bid. No abnormal bleeding. 5. Possible small LV thrombus: Not seen on more recent echoes.  Eliquis as above.  6. Snoring: Had previous sleep study at [facility] in 2020, negative for OSA. 7. Obesity: Body mass index is 42.02 kg/m.  - She is on Saxenda. 8. Uterine fibroids: She has heavy bleeding during menstrual cycle. OBGYN started Lysteda. Discussed with Dr. Aundra Dubin and PharmD, would avoid Lysteda due to need to Eliquis.  Follow up in 3 months with Dr. Aundra Dubin.   Signed, Rafael Bihari, FNP  06/30/2022  [facility] [address] [place] [address] [phone] (office) [phone] (fax)

## 2022-06-30 NOTE — Patient Instructions (Addendum)
Thank you for coming in today  Labs were done today, if any labs are abnormal the clinic will call you No news is good news  INCREASE Bidil to 1.5 tablets 3 times a day  Your physician recommends that you schedule a follow-up appointment in:  3 months with Dr. Kendall Flack have been referred to maternal fetal medicine their office will call you to make a appointment.  Notify the clinic if your systolic blood pressure is over 140    Do the following things EVERYDAY: Weigh yourself in the morning before breakfast. Write it down and keep it in a log. Take your medicines as prescribed Eat low salt foods--Limit salt (sodium) to 2000 mg per day.  Stay as active as you can everyday Limit all fluids for the day to less than 2 liters  At the [facility], you and your health needs are our priority. As part of our continuing mission to provide you with exceptional heart care, we have created designated Provider Care Teams. These Care Teams include your primary Cardiologist (physician) and Advanced Practice Providers (APPs- Physician Assistants and Nurse Practitioners) who all work together to provide you with the care you need, when you need it.   You may see any of the following providers on your designated Care Team at your next follow up: Dr Glori Bickers Dr Loralie Champagne Dr. Roxana Hires, NP Lyda Jester, [place] [facility], [place] Forestine Na, NP Audry Riles, PharmD   Please be sure to bring in all your medications bottles to every appointment.   If you have any questions or concerns before your next appointment please send [place] a message through [place] or call our office at [phone].    TO LEAVE A MESSAGE FOR THE NURSE SELECT OPTION 2, PLEASE LEAVE A MESSAGE INCLUDING: YOUR NAME DATE OF BIRTH CALL BACK NUMBER REASON FOR CALL**this is important as we prioritize the call backs  YOU WILL RECEIVE A CALL BACK THE SAME DAY AS  LONG AS YOU CALL BEFORE 4:00 PM

## 2022-06-30 NOTE — Addendum Note (Signed)
Encounter addended by: Rafael Bihari, FNP on: 06/30/2022 7:28 PM  Actions taken: Clinical Note Signed

## 2022-07-01 NOTE — Addendum Note (Signed)
Encounter addended by: Rafael Bihari, FNP on: 07/01/2022 1:24 PM  Actions taken: Clinical Note Signed

## 2022-07-07 ENCOUNTER — Other Ambulatory Visit: Payer: Self-pay | Admitting: Cardiology

## 2022-07-07 ENCOUNTER — Ambulatory Visit: Payer: 59

## 2022-07-08 ENCOUNTER — Other Ambulatory Visit (HOSPITAL_COMMUNITY): Payer: Self-pay

## 2022-07-08 MED ORDER — SAXENDA 18 MG/3ML ~~LOC~~ SOPN
3.0000 mg | PEN_INJECTOR | Freq: Every day | SUBCUTANEOUS | 0 refills | Status: DC
  Filled 2022-07-08: qty 15, 30d supply, fill #0

## 2022-07-09 ENCOUNTER — Other Ambulatory Visit (HOSPITAL_COMMUNITY): Payer: Self-pay

## 2022-07-10 ENCOUNTER — Other Ambulatory Visit (HOSPITAL_COMMUNITY): Payer: Self-pay

## 2022-07-17 ENCOUNTER — Other Ambulatory Visit (HOSPITAL_COMMUNITY): Payer: Self-pay

## 2022-07-30 DIAGNOSIS — I10 Essential (primary) hypertension: Secondary | ICD-10-CM | POA: Diagnosis not present

## 2022-07-30 DIAGNOSIS — Z86711 Personal history of pulmonary embolism: Secondary | ICD-10-CM | POA: Diagnosis not present

## 2022-07-30 DIAGNOSIS — D252 Subserosal leiomyoma of uterus: Secondary | ICD-10-CM | POA: Diagnosis not present

## 2022-07-30 DIAGNOSIS — D251 Intramural leiomyoma of uterus: Secondary | ICD-10-CM | POA: Diagnosis not present

## 2022-07-30 DIAGNOSIS — Z01411 Encounter for gynecological examination (general) (routine) with abnormal findings: Secondary | ICD-10-CM | POA: Diagnosis not present

## 2022-07-30 DIAGNOSIS — Z124 Encounter for screening for malignant neoplasm of cervix: Secondary | ICD-10-CM | POA: Diagnosis not present

## 2022-08-12 ENCOUNTER — Ambulatory Visit: Payer: 59

## 2022-09-22 ENCOUNTER — Ambulatory Visit: Payer: 59

## 2022-10-01 ENCOUNTER — Encounter (HOSPITAL_COMMUNITY): Payer: 59 | Admitting: Cardiology

## 2022-10-07 ENCOUNTER — Other Ambulatory Visit (HOSPITAL_COMMUNITY): Payer: Self-pay

## 2022-10-07 MED ORDER — WEGOVY 1.7 MG/0.75ML ~~LOC~~ SOAJ
1.7000 mg | SUBCUTANEOUS | 1 refills | Status: DC
  Filled 2022-10-07 – 2022-10-30 (×2): qty 3, 28d supply, fill #0
  Filled 2022-12-17: qty 3, 28d supply, fill #1

## 2022-10-07 MED ORDER — SAXENDA 18 MG/3ML ~~LOC~~ SOPN
3.0000 mg | PEN_INJECTOR | Freq: Every day | SUBCUTANEOUS | 11 refills | Status: DC
  Filled 2022-10-07: qty 15, 30d supply, fill #0

## 2022-10-15 ENCOUNTER — Ambulatory Visit: Payer: 59

## 2022-10-22 ENCOUNTER — Telehealth: Payer: Self-pay | Admitting: Family Medicine

## 2022-10-23 ENCOUNTER — Telehealth (HOSPITAL_COMMUNITY): Payer: Self-pay

## 2022-10-23 ENCOUNTER — Other Ambulatory Visit (HOSPITAL_COMMUNITY): Payer: Self-pay

## 2022-10-23 DIAGNOSIS — I5022 Chronic systolic (congestive) heart failure: Secondary | ICD-10-CM

## 2022-10-23 NOTE — Telephone Encounter (Signed)
Called patient and left message for her to call the office to get an echo scheduled. Order is in just needs to get it scheduled

## 2022-10-24 ENCOUNTER — Ambulatory Visit: Payer: 59

## 2022-10-30 ENCOUNTER — Ambulatory Visit (HOSPITAL_BASED_OUTPATIENT_CLINIC_OR_DEPARTMENT_OTHER): Payer: 59 | Admitting: Obstetrics

## 2022-10-30 ENCOUNTER — Other Ambulatory Visit (HOSPITAL_COMMUNITY): Payer: Self-pay | Admitting: Family Medicine

## 2022-10-30 ENCOUNTER — Ambulatory Visit: Payer: 59 | Attending: Obstetrics and Gynecology

## 2022-10-30 ENCOUNTER — Other Ambulatory Visit (HOSPITAL_COMMUNITY): Payer: Self-pay

## 2022-10-30 ENCOUNTER — Other Ambulatory Visit: Payer: Self-pay

## 2022-10-30 DIAGNOSIS — Z86711 Personal history of pulmonary embolism: Secondary | ICD-10-CM

## 2022-10-30 DIAGNOSIS — I10 Essential (primary) hypertension: Secondary | ICD-10-CM

## 2022-10-30 DIAGNOSIS — I5022 Chronic systolic (congestive) heart failure: Secondary | ICD-10-CM | POA: Insufficient documentation

## 2022-10-30 DIAGNOSIS — I5020 Unspecified systolic (congestive) heart failure: Secondary | ICD-10-CM

## 2022-10-30 DIAGNOSIS — Z3169 Encounter for other general counseling and advice on procreation: Secondary | ICD-10-CM | POA: Diagnosis not present

## 2022-10-30 DIAGNOSIS — I519 Heart disease, unspecified: Secondary | ICD-10-CM

## 2022-10-30 MED ORDER — APIXABAN 5 MG PO TABS
5.0000 mg | ORAL_TABLET | Freq: Two times a day (BID) | ORAL | 4 refills | Status: DC
  Filled 2022-10-30: qty 60, 30d supply, fill #0
  Filled 2023-02-04: qty 60, 30d supply, fill #1
  Filled 2023-06-03: qty 60, 30d supply, fill #2

## 2022-10-30 NOTE — Progress Notes (Signed)
BP: 148/97 HR: 79  BP recheck: 144/90

## 2022-11-06 NOTE — Progress Notes (Signed)
MFM Note  [name] [name] is a 37 year old gravida 1 para 1 who is seen for preconception consultation due to a history of pulmonary embolus, chronic hypertension, and heart failure.  She was first diagnosed with heart failure in March 2020, when she was found to have a pulmonary embolus.  An echocardiogram performed at that time showed an ejection fraction of between 20 to 25% with moderately reduced right ventricular systolic function.  She was then placed on heart failure medications and [name] ejection fraction improved to 60% to 65%.    The cause of [name] heart failure remains undetermined.  [name] heart failure specialist does not believe that it was due to peripartum cardiomyopathy.  She developed the pulmonary embolus while she had a Nexplanon implant in place for birth control.  She has since had the Nexplanon removed.  She also experienced an episode of atrial fibrillation which was likely triggered by [name] heart failure and pulmonary embolus in 2020.  She had an ectopic pregnancy in May 2021 during which time she was noted to have extremely elevated blood pressures of 180s over 110s.  She underwent a left salpingo-oophorectomy for treatment of the ectopic pregnancy.  The patient has continued follow-up with [name] heart failure specialist.  [name] last echocardiogram in February 2023 showed an ejection fraction of 40 to 50%.  [name] serum creatinine in February 2023 was 1.02.  She had an HSG performed in September 2023 that showed a patent right fallopian tube.  [name] current medications are: Wegovy Bidil (isosorbide dinitrate-hydralazine) Entresto  Metoprolol Lasix as needed Spironolactone Eliquis   Currently, the patient reports that she is feeling fine without any shortness of breath.  She is able to perform daily activities without any issues.  [name] blood pressures today were 148/97 and 144/90.  The patient was seen today to discuss the possibility and risks associated with carrying another  pregnancy in the near future.  [name] [name] was advised that based on [name] history of chronic systolic heart failure and prior pulmonary embolus in addition to chronic hypertension, another pregnancy may place [name] at significant risk of further morbidity and even mortality (death).    [name] heart failure specialist, Dr. Aundra Dubin also feels that a future pregnancy may be extremely risky for [name].  [name] [name] was advised to seriously consider if she is willing to accept the risks to [name] health before conceiving another pregnancy.  The patient was advised that many of the medications (Wegovy, BiDil, Entresto, spironolactone, and Eliquis) that she is currently being treated with are either contraindicated or not recommended during pregnancy due to the potential for teratogenic effects.  She understands that prior to conceiving a future pregnancy, she will have to be switched to equivalent medications that are safe in pregnancy.  She will have to take these medications for a few months prior to conceiving to determine if [name] heart failure is being adequately treated with the replacement medications.  She was advised that many women with a history of heart failure may not be able to tolerate the normal physiologic cardiovascular changes associated with pregnancy (increased workload of the heart and increased blood volume) and may develop worsening heart failure during pregnancy necessitating a previable or an indicated preterm birth. Depending on the gestational age at delivery, the baby may not always survive and may experience long-term morbidity due to the effects of extreme prematurity.  As evidenced by [name] extremely elevated blood pressures when she had an ectopic pregnancy 3 years ago, she understands that [name]  blood pressures may continue to increase during pregnancy.  The increased risk of fetal growth restriction and preeclampsia associated with hypertension in pregnancy was discussed.  As [name] [name]  reports that another pregnancy is highly desired, we will await the results of [name] repeat echocardiogram scheduled on November 28, 2022.    Should the echocardiogram show an ejection fraction that is acceptable and does not show any other cardiac lesions and does not indicate that she has pulmonary hypertension, another pregnancy may be considered if she is willing to accept the risks that were discussed today.  She understands that should pulmonary hypertension be noted on [name] echocardiogram, that this condition is a contraindication for pregnancy.  The option of surrogacy or adoption should pregnancy be contraindicated for [name] was discussed today.  Should the echocardiogram show acceptable results, I will refer [name] to the cardio-obstetrics team (Dr. Harriet Masson or Dr. Johney Frame) along with [name] heart failure specialist (Dr. Aundra Dubin) to have [name] heart failure medications switched to medications that are safe for use during pregnancy. We will then wait a few months to determine if she is able to tolerate these new medications and does not develop any worsening heart failure symptoms while on these medications.  Once she has been optimized, another pregnancy may be considered at that time.  She will have to have close follow-up with the cardio-obstetrics team and [name] heart failure specialist throughout [name] future pregnancy.  The patient understands that we will continue to have conversations about the safety of [name] carrying another pregnancy once the results from [name] echocardiogram are available.  She will contact me once those results are available.  She stated that our consultation today opened [name] eyes to the potential complications that a future pregnancy may pose.  She and [name] [name] will talk and will seriously consider if they want to proceed with another pregnancy.    She stated that all of [name] questions were answered to [name] complete satisfaction today.    I will see [name] for another consultation  once [name] echocardiogram results are available.  A total of 70 minutes was spent counseling and coordinating the care for this patient.  Greater than 50% of the time was spent in direct face-to-face contact.

## 2022-11-17 ENCOUNTER — Encounter: Payer: Self-pay | Admitting: Pharmacist

## 2022-11-28 ENCOUNTER — Ambulatory Visit (HOSPITAL_COMMUNITY)
Admission: RE | Admit: 2022-11-28 | Discharge: 2022-11-28 | Disposition: A | Discharge status: Home / Self Care | Payer: 59 | Source: Ambulatory Visit | Attending: Family Medicine | Admitting: Family Medicine

## 2022-11-28 DIAGNOSIS — I5022 Chronic systolic (congestive) heart failure: Secondary | ICD-10-CM | POA: Diagnosis not present

## 2022-11-28 DIAGNOSIS — I3139 Other pericardial effusion (noninflammatory): Secondary | ICD-10-CM | POA: Insufficient documentation

## 2022-11-28 DIAGNOSIS — I4891 Unspecified atrial fibrillation: Secondary | ICD-10-CM | POA: Diagnosis not present

## 2022-11-28 DIAGNOSIS — I509 Heart failure, unspecified: Secondary | ICD-10-CM | POA: Diagnosis not present

## 2022-11-28 DIAGNOSIS — I110 Hypertensive heart disease with heart failure: Secondary | ICD-10-CM | POA: Diagnosis not present

## 2022-11-28 LAB — ECHOCARDIOGRAM COMPLETE
AR max vel: 2.46 cm2
AV Area VTI: 2.57 cm2
AV Area mean vel: 2.34 cm2
AV Mean grad: 8 mmHg
AV Peak grad: 12.8 mmHg
Ao pk vel: 1.79 m/s
Area-P 1/2: 2.73 cm2
S' Lateral: 3.3 cm

## 2022-12-17 ENCOUNTER — Encounter (HOSPITAL_COMMUNITY): Payer: Self-pay

## 2022-12-17 ENCOUNTER — Other Ambulatory Visit (HOSPITAL_COMMUNITY): Payer: Self-pay

## 2022-12-17 MED ORDER — WEGOVY 1.7 MG/0.75ML ~~LOC~~ SOAJ
1.7000 mg | SUBCUTANEOUS | 4 refills | Status: DC
  Filled 2022-12-17 – 2023-01-14 (×2): qty 3, 28d supply, fill #0
  Filled 2023-02-10 – 2023-03-25 (×3): qty 3, 28d supply, fill #1

## 2022-12-22 ENCOUNTER — Other Ambulatory Visit (HOSPITAL_COMMUNITY): Payer: Self-pay

## 2023-01-02 ENCOUNTER — Other Ambulatory Visit (HOSPITAL_COMMUNITY): Payer: Self-pay

## 2023-01-14 ENCOUNTER — Other Ambulatory Visit (HOSPITAL_COMMUNITY): Payer: Self-pay

## 2023-01-16 ENCOUNTER — Encounter (HOSPITAL_COMMUNITY): Payer: Self-pay | Admitting: Cardiology

## 2023-02-04 ENCOUNTER — Other Ambulatory Visit (HOSPITAL_COMMUNITY): Payer: Self-pay

## 2023-02-04 ENCOUNTER — Other Ambulatory Visit: Payer: Self-pay

## 2023-02-04 ENCOUNTER — Other Ambulatory Visit (HOSPITAL_COMMUNITY): Payer: Self-pay | Admitting: Family Medicine

## 2023-02-04 MED ORDER — ENTRESTO 97-103 MG PO TABS
1.0000 | ORAL_TABLET | Freq: Two times a day (BID) | ORAL | 4 refills | Status: DC
  Filled 2023-02-04: qty 60, 30d supply, fill #0
  Filled 2024-01-21: qty 60, 30d supply, fill #1

## 2023-02-06 ENCOUNTER — Other Ambulatory Visit (HOSPITAL_COMMUNITY): Payer: Self-pay

## 2023-02-10 ENCOUNTER — Other Ambulatory Visit (HOSPITAL_COMMUNITY): Payer: Self-pay

## 2023-02-19 ENCOUNTER — Other Ambulatory Visit: Payer: Self-pay

## 2023-03-21 ENCOUNTER — Other Ambulatory Visit (HOSPITAL_COMMUNITY): Payer: Self-pay

## 2023-03-26 ENCOUNTER — Other Ambulatory Visit (HOSPITAL_COMMUNITY): Payer: Self-pay

## 2023-03-30 ENCOUNTER — Other Ambulatory Visit (HOSPITAL_COMMUNITY): Payer: Self-pay

## 2023-04-09 ENCOUNTER — Other Ambulatory Visit (HOSPITAL_COMMUNITY): Payer: Self-pay

## 2023-06-16 ENCOUNTER — Other Ambulatory Visit (HOSPITAL_COMMUNITY): Payer: Self-pay

## 2023-09-28 ENCOUNTER — Other Ambulatory Visit (HOSPITAL_COMMUNITY): Payer: Self-pay

## 2023-09-28 DIAGNOSIS — R197 Diarrhea, unspecified: Secondary | ICD-10-CM | POA: Diagnosis not present

## 2023-09-28 DIAGNOSIS — I10 Essential (primary) hypertension: Secondary | ICD-10-CM | POA: Diagnosis not present

## 2023-09-28 DIAGNOSIS — R1013 Epigastric pain: Secondary | ICD-10-CM | POA: Diagnosis not present

## 2023-09-28 MED ORDER — ONDANSETRON 4 MG PO TBDP
4.0000 mg | ORAL_TABLET | Freq: Three times a day (TID) | ORAL | 0 refills | Status: DC | PRN
  Filled 2023-09-28: qty 20, 7d supply, fill #0

## 2023-09-28 MED ORDER — AZITHROMYCIN 500 MG PO TABS
500.0000 mg | ORAL_TABLET | Freq: Every day | ORAL | 0 refills | Status: DC
  Filled 2023-09-28: qty 3, 3d supply, fill #0

## 2023-09-28 MED ORDER — DICYCLOMINE HCL 10 MG PO CAPS
10.0000 mg | ORAL_CAPSULE | Freq: Three times a day (TID) | ORAL | 0 refills | Status: AC | PRN
  Filled 2023-09-28 (×2): qty 30, 10d supply, fill #0

## 2023-10-04 ENCOUNTER — Other Ambulatory Visit: Payer: Self-pay

## 2023-10-04 ENCOUNTER — Encounter (HOSPITAL_BASED_OUTPATIENT_CLINIC_OR_DEPARTMENT_OTHER): Payer: Self-pay

## 2023-10-04 ENCOUNTER — Emergency Department (HOSPITAL_BASED_OUTPATIENT_CLINIC_OR_DEPARTMENT_OTHER)
Admission: EM | Admit: 2023-10-04 | Discharge: 2023-10-04 | Disposition: A | Discharge status: Home / Self Care | Payer: 59 | Attending: Emergency Medicine | Admitting: Emergency Medicine

## 2023-10-04 DIAGNOSIS — E860 Dehydration: Secondary | ICD-10-CM | POA: Diagnosis not present

## 2023-10-04 DIAGNOSIS — R1013 Epigastric pain: Secondary | ICD-10-CM | POA: Insufficient documentation

## 2023-10-04 DIAGNOSIS — I10 Essential (primary) hypertension: Secondary | ICD-10-CM | POA: Insufficient documentation

## 2023-10-04 DIAGNOSIS — R197 Diarrhea, unspecified: Secondary | ICD-10-CM | POA: Insufficient documentation

## 2023-10-04 DIAGNOSIS — Z794 Long term (current) use of insulin: Secondary | ICD-10-CM | POA: Diagnosis not present

## 2023-10-04 DIAGNOSIS — D649 Anemia, unspecified: Secondary | ICD-10-CM | POA: Insufficient documentation

## 2023-10-04 DIAGNOSIS — Z79899 Other long term (current) drug therapy: Secondary | ICD-10-CM | POA: Insufficient documentation

## 2023-10-04 DIAGNOSIS — Z7901 Long term (current) use of anticoagulants: Secondary | ICD-10-CM | POA: Diagnosis not present

## 2023-10-04 LAB — URINALYSIS, ROUTINE W REFLEX MICROSCOPIC
Bacteria, UA: NONE SEEN
Bilirubin Urine: NEGATIVE
Glucose, UA: NEGATIVE mg/dL
Hgb urine dipstick: NEGATIVE
Ketones, ur: NEGATIVE mg/dL
Leukocytes,Ua: NEGATIVE
Nitrite: NEGATIVE
Protein, ur: 100 mg/dL — AB
Specific Gravity, Urine: 1.029 (ref 1.005–1.030)
pH: 6 (ref 5.0–8.0)

## 2023-10-04 LAB — CBC WITH DIFFERENTIAL/PLATELET
Abs Immature Granulocytes: 0.02 10*3/uL (ref 0.00–0.07)
Basophils Absolute: 0 10*3/uL (ref 0.0–0.1)
Basophils Relative: 0 %
Eosinophils Absolute: 0.1 10*3/uL (ref 0.0–0.5)
Eosinophils Relative: 1 %
HCT: 31.3 % — ABNORMAL LOW (ref 36.0–46.0)
Hemoglobin: 9 g/dL — ABNORMAL LOW (ref 12.0–15.0)
Immature Granulocytes: 0 %
Lymphocytes Relative: 19 %
Lymphs Abs: 1.3 10*3/uL (ref 0.7–4.0)
MCH: 20.3 pg — ABNORMAL LOW (ref 26.0–34.0)
MCHC: 28.8 g/dL — ABNORMAL LOW (ref 30.0–36.0)
MCV: 70.7 fL — ABNORMAL LOW (ref 80.0–100.0)
Monocytes Absolute: 0.6 10*3/uL (ref 0.1–1.0)
Monocytes Relative: 9 %
Neutro Abs: 4.8 10*3/uL (ref 1.7–7.7)
Neutrophils Relative %: 71 %
Platelets: 303 10*3/uL (ref 150–400)
RBC: 4.43 MIL/uL (ref 3.87–5.11)
RDW: 17.6 % — ABNORMAL HIGH (ref 11.5–15.5)
WBC: 6.7 10*3/uL (ref 4.0–10.5)
nRBC: 0 % (ref 0.0–0.2)

## 2023-10-04 LAB — COMPREHENSIVE METABOLIC PANEL
ALT: 94 U/L — ABNORMAL HIGH (ref 0–44)
AST: 83 U/L — ABNORMAL HIGH (ref 15–41)
Albumin: 3.3 g/dL — ABNORMAL LOW (ref 3.5–5.0)
Alkaline Phosphatase: 41 U/L (ref 38–126)
Anion gap: 6 (ref 5–15)
BUN: 23 mg/dL — ABNORMAL HIGH (ref 6–20)
CO2: 24 mmol/L (ref 22–32)
Calcium: 8.4 mg/dL — ABNORMAL LOW (ref 8.9–10.3)
Chloride: 110 mmol/L (ref 98–111)
Creatinine, Ser: 1.15 mg/dL — ABNORMAL HIGH (ref 0.44–1.00)
GFR, Estimated: >60 mL/min (ref >60)
Glucose, Bld: 97 mg/dL (ref 70–99)
Potassium: 3.9 mmol/L (ref 3.5–5.1)
Sodium: 140 mmol/L (ref 135–145)
Total Bilirubin: 2 mg/dL — ABNORMAL HIGH (ref <1.2)
Total Protein: 5.6 g/dL — ABNORMAL LOW (ref 6.5–8.1)

## 2023-10-04 LAB — PREGNANCY, URINE: Preg Test, Ur: NEGATIVE

## 2023-10-04 LAB — LIPASE, BLOOD: Lipase: 11 U/L (ref 11–51)

## 2023-10-04 MED ORDER — LIDOCAINE VISCOUS HCL 2 % MT SOLN
15.0000 mL | Freq: Once | OROMUCOSAL | Status: AC
  Administered 2023-10-04: 15 mL via ORAL
  Filled 2023-10-04: qty 15

## 2023-10-04 MED ORDER — ALUM & MAG HYDROXIDE-SIMETH 200-200-20 MG/5ML PO SUSP
30.0000 mL | Freq: Once | ORAL | Status: AC
  Administered 2023-10-04: 30 mL via ORAL
  Filled 2023-10-04: qty 30

## 2023-10-04 MED ORDER — LOPERAMIDE HCL 2 MG PO CAPS
2.0000 mg | ORAL_CAPSULE | Freq: Four times a day (QID) | ORAL | 0 refills | Status: DC | PRN
  Filled 2023-10-04: qty 12, 3d supply, fill #0

## 2023-10-04 MED ORDER — LOPERAMIDE HCL 2 MG PO CAPS
4.0000 mg | ORAL_CAPSULE | Freq: Once | ORAL | Status: AC
  Administered 2023-10-04: 4 mg via ORAL
  Filled 2023-10-04: qty 2

## 2023-10-04 NOTE — ED Notes (Signed)
Pt alert and oriented X 4 at the time of discharge. RR even and unlabored. No acute distress noted. Pt verbalized understanding of discharge instructions as discussed. Pt ambulatory to lobby at time of discharge.

## 2023-10-04 NOTE — ED Triage Notes (Signed)
Pt reports upper abdominal pain and diarrhea X 3 weeks. Treated at [facility] for ecoli which does not seem to be working per pt. Pt also endorses weakness and fatigue.

## 2023-10-04 NOTE — ED Provider Notes (Signed)
[place] EMERGENCY DEPARTMENT AT [facility] Provider Note   CSN: [id number] Arrival date & time: 10/04/23  4098     History Chief Complaint  Patient presents with   Abdominal Pain   Diarrhea    [name] [name] is a 37 y.o. female.  Patient past history significant for hypertension, obesity, abdominal pain, presents the emergency department with concerns of abdominal pain and diarrhea.  She reports that she has been experiencing abdominal discomfort in the epigastrium and diarrhea for the last 3 weeks.  She reportedly was evaluated at urgent care with stool testing performed there which showed that patient was positive for enteroaggregate of E. coli.  Reports that she has been on antibiotic therapy for the last several days without improvement in symptoms.  Otherwise not doing any over-the-counter medications for management of symptoms such as Imodium.  Denies any blood in her stool.  Reports minimal vomiting and this was prior to being seen at the urgent care center recurrence and vomiting.  Concern for some general dehydration as she is feeling weak and fatigued.  Patient started on azithromycin for presumed infectious diarrhea.   Abdominal Pain Associated symptoms: diarrhea   Diarrhea Associated symptoms: abdominal pain        Home Medications Prior to Admission medications   Medication Sig Start Date End Date Taking? Authorizing Provider  apixaban (ELIQUIS) 5 MG TABS tablet Take 1 tablet (5 mg total) by mouth 2 (two) times daily. 10/30/22   Milford, Anderson Malta, FNP  azithromycin (ZITHROMAX) 500 MG tablet Take 1 tablet (500 mg total) by mouth daily for 3 days 09/28/23     dicyclomine (BENTYL) 10 MG capsule Take 1 capsule (10 mg total) by mouth 3 (three) times daily as needed for (abdominal cramping). 09/28/23     furosemide (LASIX) 20 MG tablet Take 1 tablet (20 mg total) by mouth as needed. 09/13/21   Jacklynn Ganong, FNP  Insulin Pen Needle (UNIFINE PENTIPS) 32G X 6 MM  MISC Use as directed with Saxenda. 03/14/22   Laurey Morale, MD  isosorbide-hydrALAZINE (BIDIL) 20-37.5 MG tablet Take 1.5 tablets by mouth 3 (three) times daily. 06/30/22   Milford, Anderson Malta, FNP  metoprolol succinate (TOPROL-XL) 100 MG 24 hr tablet Take 1 tablet (100 mg total) by mouth daily. 10/18/21     ondansetron (ZOFRAN-ODT) 4 MG disintegrating tablet Dissolve 1 tablet (4 mg total) on tongue every 8 (eight) hours as needed for nausea or vomiting. 09/28/23     sacubitril-valsartan (ENTRESTO) 97-103 MG Take 1 tablet by mouth 2 (two) times daily. NEEDS FOLLOW UP APPOINTMENT FOR MORE REFILLS 02/04/23   Laurey Morale, MD  Semaglutide-Weight Management [facility]) 1.7 MG/0.75ML SOAJ Inject 1.7 mg into the skin once a week. 12/17/22   Laurey Morale, MD  spironolactone (ALDACTONE) 25 MG tablet Take 1 tablet (25 mg total) by mouth daily. 04/30/22   Jacklynn Ganong, FNP      Allergies    Patient has no known allergies.    Review of Systems   Review of Systems  Gastrointestinal:  Positive for abdominal pain and diarrhea.  All other systems reviewed and are negative.   Physical Exam Updated Vital Signs BP (!) 161/132 (BP Location: Right Arm)   Pulse 85   Temp 98 F (36.7 C) (Oral)   Resp 18   Ht 5' 3.5" (1.613 m)   Wt 97.1 kg   SpO2 100%   BMI 37.31 kg/m  Physical Exam Vitals and nursing note reviewed.  Constitutional:      General: She is not in acute distress.    Appearance: She is well-developed.  HENT:     Head: Normocephalic and atraumatic.  Eyes:     Conjunctiva/sclera: Conjunctivae normal.  Cardiovascular:     Rate and Rhythm: Normal rate and regular rhythm.     Heart sounds: No murmur heard. Pulmonary:     Effort: Pulmonary effort is normal. No respiratory distress.     Breath sounds: Normal breath sounds.  Abdominal:     Palpations: Abdomen is soft.     Tenderness: There is abdominal tenderness in the epigastric area.  Musculoskeletal:        General: No  swelling.     Cervical back: Neck supple.  Skin:    General: Skin is warm and dry.     Capillary Refill: Capillary refill takes less than 2 seconds.  Neurological:     Mental Status: She is alert.  Psychiatric:        Mood and Affect: Mood normal.     ED Results / Procedures / Treatments   Labs (all labs ordered are listed, but only abnormal results are displayed) Labs Reviewed  CBC WITH DIFFERENTIAL/PLATELET - Abnormal; Notable for the following components:      Result Value   Hemoglobin 9.0 (*)    HCT 31.3 (*)    MCV 70.7 (*)    MCH 20.3 (*)    MCHC 28.8 (*)    RDW 17.6 (*)    All other components within normal limits  COMPREHENSIVE METABOLIC PANEL - Abnormal; Notable for the following components:   BUN 23 (*)    Creatinine, Ser 1.15 (*)    Calcium 8.4 (*)    Total Protein 5.6 (*)    Albumin 3.3 (*)    AST 83 (*)    ALT 94 (*)    Total Bilirubin 2.0 (*)    All other components within normal limits  URINALYSIS, ROUTINE W REFLEX MICROSCOPIC - Abnormal; Notable for the following components:   Protein, ur 100 (*)    All other components within normal limits  LIPASE, BLOOD  PREGNANCY, URINE    EKG None  Radiology No results found.  Procedures Procedures   Medications Ordered in ED Medications  alum & mag hydroxide-simeth (MAALOX/MYLANTA) 200-200-20 MG/5ML suspension 30 mL (30 mLs Oral Given 10/04/23 1005)    And  lidocaine (XYLOCAINE) 2 % viscous mouth solution 15 mL (15 mLs Oral Given 10/04/23 1005)  loperamide (IMODIUM) capsule 4 mg (4 mg Oral Given 10/04/23 1000)    ED Course/ Medical Decision Making/ A&P                               Medical Decision Making Amount and/or Complexity of Data Reviewed Labs: ordered.  Risk OTC drugs. Prescription drug management.   This patient presents to the ED for concern of abdominal pain, diarrhea.  Differential diagnosis includes gastroenteritis, bowel obstruction, cholecystitis, appendicitis   Lab  Tests:  I Ordered, and personally interpreted labs.  The pertinent results include: CBC with anemia seen with hemoglobin at 9.0 stable from last few days, CMP with slight elevation in creatinine from prior labs indicating mild dehydration, UA unremarkable, urine pregnancy negative, lipase negative    Medicines ordered and prescription drug management:  I ordered medication including Maalox, viscous lidocaine, loperamide for epigastric discomfort, diarrhea Reevaluation of the patient after these medicines showed that the patient  improved I have reviewed the patients home medicines and have made adjustments as needed   Problem List / ED Course:  Patient with past history significant for hypertension, obesity, chronic abdominal pain presents to the emergency department concerns of epigastric abdominal pain and diarrhea for the last 3 weeks.  She presented this is likely due to an infectious source as her and her family all became ill after Thanksgiving from an ice cream shop.  She states that her symptoms have not resolved.  Was seen in urgent care several days ago and started on azithromycin for presumed infectious diarrhea which she reports is not improving her symptoms.  Denies taking any over-the-counter medications such as loperamide for symptom control.  Trying her best to stay hydrated but having a hard time with appetite and oral intake.  Most of the patient is likely somewhat volume down so we will initiate with lab collection and possibly administer IV fluids. Patient's labs show a mild anemia which may be causing her fatigue.  Hemoglobin at 9.0 and stable compared to labs from a few days ago to urgent care.  CMP shows mild dehydration with creatinine up to 1.15 slightly higher than baseline although no recent labs to compare to.  GFR preserved and greater than 60.  Urine pregnancy negative, urinalysis unremarkable for infection, lipase negative. Based on chart review, appears that patient  had stool culture which was positive for enteroaggregate E. coli which is commonly the source of traveler's diarrhea.  This is typically managed symptomatically without acute need for antibiotic therapy.  Advised patient that she can continue to take the azithromycin as prescribed by the urgent care if she feels that she is getting any improvement in symptoms with this.  Otherwise, encourage management with over-the-counter medication such as Imodium for the diarrhea.  Patient was given a course of Imodium here along with Maalox and viscous lidocaine which she does report improved her symptoms she was able to sleep.  Discussed strict return precautions.  Otherwise encourage close follow-up with primary care provider in the outpatient setting.  Patient discharged home in stable condition.  Final Clinical Impression(s) / ED Diagnoses Final diagnoses:  Diarrhea, unspecified type    Rx / DC Orders ED Discharge Orders     None         Smitty Knudsen, PA-C 10/04/23 1250    Virgina Norfolk, DO 10/04/23 1438

## 2023-10-04 NOTE — Discharge Instructions (Signed)
You are seen in the emergency department today with concerns of abdominal pain and diarrhea.  Your labs were reassuring although there is some indication for possible anemia present.  I would recommend following up with your primary care provider for this.  I looked in your chart to find your stool culture results which did show you tested positive for enteroaggregate E. coli from your urgent care visit.  This type of E. coli is typically managed with symptomatic control and no acute need for antibiotics.  You may continue your azithromycin if you feel this is improving her symptoms.  I would recommend adding on Imodium for your diarrhea after every loose bowel movement with a maximum 4 doses in a day.  If you begin to experience any bloody diarrhea, bloody vomit, or any other acute changes, please return the emergency department.

## 2023-10-05 ENCOUNTER — Other Ambulatory Visit (HOSPITAL_COMMUNITY): Payer: Self-pay

## 2023-10-07 ENCOUNTER — Other Ambulatory Visit (HOSPITAL_COMMUNITY): Payer: Self-pay

## 2023-10-07 ENCOUNTER — Other Ambulatory Visit (HOSPITAL_COMMUNITY): Payer: Self-pay | Admitting: Family Medicine

## 2023-10-07 DIAGNOSIS — I5042 Chronic combined systolic (congestive) and diastolic (congestive) heart failure: Secondary | ICD-10-CM

## 2023-10-08 ENCOUNTER — Other Ambulatory Visit: Payer: Self-pay

## 2023-10-08 ENCOUNTER — Other Ambulatory Visit (HOSPITAL_COMMUNITY): Payer: Self-pay

## 2023-10-08 MED ORDER — METOPROLOL SUCCINATE ER 100 MG PO TB24
100.0000 mg | ORAL_TABLET | Freq: Every day | ORAL | 1 refills | Status: AC
  Filled 2023-10-08: qty 90, 90d supply, fill #0
  Filled 2024-07-15: qty 90, 90d supply, fill #1

## 2023-10-09 ENCOUNTER — Other Ambulatory Visit (HOSPITAL_COMMUNITY): Payer: Self-pay

## 2023-10-09 MED ORDER — SPIRONOLACTONE 25 MG PO TABS
25.0000 mg | ORAL_TABLET | Freq: Every day | ORAL | 3 refills | Status: AC
  Filled 2023-10-09: qty 90, 90d supply, fill #0
  Filled 2024-07-15: qty 90, 90d supply, fill #1

## 2023-10-09 MED ORDER — FUROSEMIDE 20 MG PO TABS
20.0000 mg | ORAL_TABLET | ORAL | 0 refills | Status: DC | PRN
  Filled 2023-10-09: qty 30, 30d supply, fill #0

## 2023-10-09 MED ORDER — ISOSORB DINITRATE-HYDRALAZINE 20-37.5 MG PO TABS
1.5000 | ORAL_TABLET | Freq: Three times a day (TID) | ORAL | 0 refills | Status: DC
  Filled 2023-10-09: qty 135, 30d supply, fill #0

## 2023-11-05 ENCOUNTER — Telehealth: Payer: 59 | Admitting: Physician Assistant

## 2023-11-05 ENCOUNTER — Other Ambulatory Visit (HOSPITAL_COMMUNITY): Payer: Self-pay

## 2023-11-05 DIAGNOSIS — J208 Acute bronchitis due to other specified organisms: Secondary | ICD-10-CM

## 2023-11-05 MED ORDER — BENZONATATE 100 MG PO CAPS
100.0000 mg | ORAL_CAPSULE | Freq: Three times a day (TID) | ORAL | 0 refills | Status: AC | PRN
  Filled 2023-11-05: qty 30, 10d supply, fill #0

## 2023-11-05 MED ORDER — PREDNISONE 20 MG PO TABS
40.0000 mg | ORAL_TABLET | Freq: Every day | ORAL | 0 refills | Status: DC
  Filled 2023-11-05: qty 10, 5d supply, fill #0

## 2023-11-05 NOTE — Progress Notes (Signed)
I have spent 5 minutes in review of e-visit questionnaire, review and updating patient chart, medical decision making and response to patient.   Mia Milan Cody Jacklynn Dehaas, PA-C    

## 2023-11-05 NOTE — Progress Notes (Signed)
E-Visit for Cough   We are sorry that you are not feeling well.  Here is how we plan to help!  Based on your presentation I believe you most likely have A cough due to a virus.  This is called viral bronchitis and is best treated by rest, plenty of fluids and control of the cough.  You may use Ibuprofen or Tylenol as directed to help your symptoms.     In addition you may use A prescription cough medication called Tessalon Perles 100mg . You may take 1-2 capsules every 8 hours as needed for your cough.  I have added on a short course of prednisone to relax airways and calm coughing.   From your responses in the eVisit questionnaire you describe inflammation in the upper respiratory tract which is causing a significant cough.  This is commonly called Bronchitis and has four common causes:   Allergies Viral Infections Acid Reflux Bacterial Infection Allergies, viruses and acid reflux are treated by controlling symptoms or eliminating the cause. An example might be a cough caused by taking certain blood pressure medications. You stop the cough by changing the medication. Another example might be a cough caused by acid reflux. Controlling the reflux helps control the cough.  USE OF BRONCHODILATOR ("RESCUE") INHALERS: There is a risk from using your bronchodilator too frequently.  The risk is that over-reliance on a medication which only relaxes the muscles surrounding the breathing tubes can reduce the effectiveness of medications prescribed to reduce swelling and congestion of the tubes themselves.  Although you feel brief relief from the bronchodilator inhaler, your asthma may actually be worsening with the tubes becoming more swollen and filled with mucus.  This can delay other crucial treatments, such as oral steroid medications. If you need to use a bronchodilator inhaler daily, several times per day, you should discuss this with your provider.  There are probably better treatments that could be  used to keep your asthma under control.     HOME CARE Only take medications as instructed by your medical team. Complete the entire course of an antibiotic. Drink plenty of fluids and get plenty of rest. Avoid close contacts especially the very young and the elderly Cover your mouth if you cough or cough into your sleeve. Always remember to wash your hands A steam or ultrasonic humidifier can help congestion.   GET HELP RIGHT AWAY IF: You develop worsening fever. You become short of breath You cough up blood. Your symptoms persist after you have completed your treatment plan MAKE SURE YOU  Understand these instructions. Will watch your condition. Will get help right away if you are not doing well or get worse.    Thank you for choosing an e-visit.  Your e-visit answers were reviewed by a board certified advanced clinical practitioner to complete your personal care plan. Depending upon the condition, your plan could have included both over the counter or prescription medications.  Please review your pharmacy choice. Make sure the pharmacy is open so you can pick up prescription now. If there is a problem, you may contact your provider through [employer] and have the prescription routed to another pharmacy.  Your safety is important to [place]. If you have drug allergies check your prescription carefully.   For the next 24 hours you can use MyChart to ask questions about today's visit, request a non-urgent call back, or ask for a work or school excuse. You will get an email in the next two days asking  about your experience. I hope that your e-visit has been valuable and will speed your recovery.

## 2023-11-22 ENCOUNTER — Other Ambulatory Visit (HOSPITAL_COMMUNITY): Payer: Self-pay | Admitting: Family Medicine

## 2023-11-25 ENCOUNTER — Other Ambulatory Visit (HOSPITAL_COMMUNITY): Payer: Self-pay

## 2023-11-25 MED ORDER — APIXABAN 5 MG PO TABS
5.0000 mg | ORAL_TABLET | Freq: Two times a day (BID) | ORAL | 4 refills | Status: AC
  Filled 2023-11-25: qty 60, 30d supply, fill #0
  Filled 2024-01-21: qty 60, 30d supply, fill #1
  Filled 2024-07-15: qty 60, 30d supply, fill #2

## 2023-12-01 ENCOUNTER — Other Ambulatory Visit (HOSPITAL_COMMUNITY): Payer: Self-pay

## 2023-12-10 ENCOUNTER — Other Ambulatory Visit (HOSPITAL_COMMUNITY): Payer: Self-pay | Admitting: Cardiology

## 2023-12-10 ENCOUNTER — Ambulatory Visit (HOSPITAL_COMMUNITY)
Admission: RE | Admit: 2023-12-10 | Discharge: 2023-12-10 | Disposition: A | Discharge status: Home / Self Care | Payer: 59 | Source: Ambulatory Visit | Attending: Cardiology | Admitting: Cardiology

## 2023-12-10 ENCOUNTER — Other Ambulatory Visit (HOSPITAL_COMMUNITY): Payer: Self-pay

## 2023-12-10 ENCOUNTER — Inpatient Hospital Stay (HOSPITAL_COMMUNITY)
Admission: RE | Admit: 2023-12-10 | Discharge: 2023-12-10 | Disposition: A | Discharge status: Home / Self Care | Payer: 59 | Source: Ambulatory Visit

## 2023-12-10 VITALS — BP 140/100 | HR 75 | Ht 63.0 in | Wt 219.2 lb

## 2023-12-10 DIAGNOSIS — I110 Hypertensive heart disease with heart failure: Secondary | ICD-10-CM | POA: Insufficient documentation

## 2023-12-10 DIAGNOSIS — Z7901 Long term (current) use of anticoagulants: Secondary | ICD-10-CM | POA: Insufficient documentation

## 2023-12-10 DIAGNOSIS — Z79899 Other long term (current) drug therapy: Secondary | ICD-10-CM | POA: Diagnosis not present

## 2023-12-10 DIAGNOSIS — Z91148 Patient's other noncompliance with medication regimen for other reason: Secondary | ICD-10-CM | POA: Insufficient documentation

## 2023-12-10 DIAGNOSIS — Z86711 Personal history of pulmonary embolism: Secondary | ICD-10-CM | POA: Diagnosis not present

## 2023-12-10 DIAGNOSIS — R002 Palpitations: Secondary | ICD-10-CM

## 2023-12-10 DIAGNOSIS — I5022 Chronic systolic (congestive) heart failure: Secondary | ICD-10-CM | POA: Insufficient documentation

## 2023-12-10 DIAGNOSIS — I428 Other cardiomyopathies: Secondary | ICD-10-CM | POA: Insufficient documentation

## 2023-12-10 DIAGNOSIS — I480 Paroxysmal atrial fibrillation: Secondary | ICD-10-CM | POA: Insufficient documentation

## 2023-12-10 DIAGNOSIS — I5042 Chronic combined systolic (congestive) and diastolic (congestive) heart failure: Secondary | ICD-10-CM

## 2023-12-10 LAB — BASIC METABOLIC PANEL
Anion gap: 8 (ref 5–15)
BUN: 18 mg/dL (ref 6–20)
CO2: 21 mmol/L — ABNORMAL LOW (ref 22–32)
Calcium: 8.7 mg/dL — ABNORMAL LOW (ref 8.9–10.3)
Chloride: 110 mmol/L (ref 98–111)
Creatinine, Ser: 1.11 mg/dL — ABNORMAL HIGH (ref 0.44–1.00)
GFR, Estimated: >60 mL/min (ref >60)
Glucose, Bld: 82 mg/dL (ref 70–99)
Potassium: 4.3 mmol/L (ref 3.5–5.1)
Sodium: 139 mmol/L (ref 135–145)

## 2023-12-10 LAB — BRAIN NATRIURETIC PEPTIDE: B Natriuretic Peptide: 1237.7 pg/mL — ABNORMAL HIGH (ref 0.0–100.0)

## 2023-12-10 MED ORDER — ISOSORB DINITRATE-HYDRALAZINE 20-37.5 MG PO TABS
2.0000 | ORAL_TABLET | Freq: Three times a day (TID) | ORAL | 6 refills | Status: DC
  Filled 2023-12-10 (×2): qty 180, 30d supply, fill #0
  Filled 2024-10-22: qty 180, 30d supply, fill #1

## 2023-12-10 MED ORDER — TORSEMIDE 20 MG PO TABS
20.0000 mg | ORAL_TABLET | Freq: Every day | ORAL | 6 refills | Status: DC
  Filled 2023-12-10: qty 30, 30d supply, fill #0

## 2023-12-10 NOTE — Progress Notes (Signed)
 ReDS Vest / Clip - 12/10/23 1548       ReDS Vest / Clip   Station Marker B    Ruler Value 40    ReDS Value Range Low volume    ReDS Actual Value 35

## 2023-12-10 NOTE — Progress Notes (Signed)
 [facility] Note  PCP:  Lewis Moccasin, MD  Cardiologist: Dr Shirlee Latch  Chief Complaint: Heart Failure Follow Up, SOB and Palpitations    History of Present Illness: [place] Fout is a 38 y.o. female with a history of pregnancy induced HTN, systolic HF with EF 16% on cMRI (12/2018), acute PE 12/2018 now on Eliquis, possible LV thrombus, and PAF on Eliquis.    Admitted 12/17/18 with 30 lb weight gain. CTA showed RLL PE with no obvious right heart strain. She was started on heparin and transitioned to Eliquis prior to discharge. Echo showed EF 20-25% with moderately reduced RV systolic function. AHF team consulted. She was diuresed with lasix drip and metolazone. Coronary CT showed no CAD. HF medications were optimized. cMRI showed EF 16% with possible LV thrombus, RV EF 21%, and LGE pattern that could be consistent with myocarditis. Co-ox was monitored and remained normal. She had an episode of atrial fibrillation that converted with IV amiodarone. She was sent home on amiodarone but is now off it.    Echo in 6/20 showed EF up to 60-65% with moderate LVH and normal RV size and systolic function.   She became pregnant 5/21 and she was instructed to stop spiro, Entresto and Eliquis; she stopped carvedilol and BiDil as well. She followed up with AHF PharmD 6/21 and BP was 180s/110s, requiring clonidine. Carvedilol, BiDil and Lasix were restarted.  Unfortunately she suffered an ectopic pregnancy w/ hemoperitoneum and underwent left salpingectomy 6/21.  Lost to follow up. Ran out of HF meds in 02/2021.  Seen back in clinic 11/22, BP elevated and GDMT restarted.  Echo in 11/22 (off meds) with EF 35-40%.   Echo 2/23 EF 45-50% with mild LVH, normal RV, mild MR, IVC normal.   Echo 2/24 EF 50-55%, RV normal.  She presents to clinic today for f/u. Hasn't been seen in clinic since 9/23. She admits to poor compliance with meds. She has a supply of all prescribed meds but compliance  is spotty, not taking every day. Over the last 2 months, she has experienced progressive dyspnea and wt gain as well as LEE. She self increased her lasix from PRN to daily. Has been taking lasix 40 mg daily for the last 3 wks w/ minimal improvement and reports UOP is not robust. She took all of her prescribed meds this morning + mid day dose of Bidil and her BP is still elevated at 140/100.   EKG shows NSR but she reports frequent tachy palpitations and near syncope. No frank syncope.   She said she is not planning on getting pregnant. Using contraception.    PMH: 1. Pregnancy-induced HTN 2. Pulmonary embolus: RLL, 3/20.  3. Atrial fibrillation: Paroxysmal.  Episode noted during 3/20 hospitalization.  4. Chronic systolic CHF: Nonischemic cardiomyopathy.  - Echo (3/20) with EF 20-25%, moderately decreased RV systolic function.  - Cardiac MRI (3/20): EF 16% with possible LV thrombus, RV EF 21%, and LGE pattern that could be consistent with myocarditis. - Coronary CTA (3/20): No CAD - Echo (6/20): EF 60-65%, moderate LVH, normal RV size and systolic function - Echo (11/22): EF 35-40%, global HK, grade II DD, RV is low/normal - Echo (2/23): EF 45-50% with mild LVH, normal RV, mild MR, IVC normal. 5. Sleep study 6/20 showed no OSA.   Past Surgical History:  Procedure Laterality Date   BIRTH CONTROL IMPLANT Left 03/2016   "Nexplanon in my upper arm"   CESAREAN SECTION N/A 02/25/2016  Procedure: CESAREAN SECTION;  Surgeon: Levie Heritage, DO;  Location: [facility] BIRTHING SUITES;  Service: Obstetrics;  Laterality: N/A;   LAPAROSCOPIC SALPINGO OOPHERECTOMY Left 04/11/2020   Procedure: LAPAROSCOPIC SALPINGECTOMY/Removal of Hemoperitoneum, lysis of adhesions;  Surgeon: Maxie Better, MD;  Location: [facility] OR;  Service: Gynecology;  Laterality: Left;   Current Outpatient Medications  Medication Sig Dispense Refill   apixaban (ELIQUIS) 5 MG TABS tablet Take 1 tablet (5 mg total) by mouth 2 (two) times  daily. NEEDS FOLLOW UP APPOINTMENT FOR MORE REFILLS 60 tablet 4   loperamide (IMODIUM) 2 MG capsule Take 1 capsule (2 mg total) by mouth 4 (four) times daily as needed for diarrhea or loose stools. 12 capsule 0   metoprolol succinate (TOPROL-XL) 100 MG 24 hr tablet Take 1 tablet (100 mg total) by mouth daily. 90 tablet 1   ondansetron (ZOFRAN-ODT) 4 MG disintegrating tablet Dissolve 1 tablet (4 mg total) on tongue every 8 (eight) hours as needed for nausea or vomiting. 20 tablet 0   sacubitril-valsartan (ENTRESTO) 97-103 MG Take 1 tablet by mouth 2 (two) times daily. NEEDS FOLLOW UP APPOINTMENT FOR MORE REFILLS 60 tablet 4   spironolactone (ALDACTONE) 25 MG tablet Take 1 tablet (25 mg total) by mouth daily. 90 tablet 3   torsemide (DEMADEX) 20 MG tablet Take 1 tablet (20 mg total) by mouth daily. 30 tablet 6   benzonatate (TESSALON) 100 MG capsule Take 1 capsule (100 mg total) by mouth 3 (three) times daily as needed for cough. (Patient not taking: Reported on 12/10/2023) 30 capsule 0   dicyclomine (BENTYL) 10 MG capsule Take 1 capsule (10 mg total) by mouth 3 (three) times daily as needed for (abdominal cramping). (Patient not taking: Reported on 12/10/2023) 30 capsule 0   Insulin Pen Needle (UNIFINE PENTIPS) 32G X 6 MM MISC Use as directed with Saxenda. (Patient not taking: Reported on 12/10/2023) 100 each 0   isosorbide-hydrALAZINE (BIDIL) 20-37.5 MG tablet Take 2 tablets by mouth 3 (three) times daily. 180 tablet 6   predniSONE (DELTASONE) 20 MG tablet Take 2 tablets (40 mg total) by mouth daily with breakfast. (Patient not taking: Reported on 12/10/2023) 10 tablet 0   Semaglutide-Weight Management (WEGOVY) 1.7 MG/0.75ML SOAJ Inject 1.7 mg into the skin once a week. (Patient not taking: Reported on 12/10/2023) 3 mL 4   No current facility-administered medications for this encounter.   Allergies:   Patient has no known allergies.   Social History:  The patient  reports that she has never smoked. She  has never used smokeless tobacco. She reports that she does not drink alcohol and does not use drugs.   Family History:  The patient's family history includes Heart disease in her mother; Hypertension in her mother; Stroke in her brother. Mother died of MI at 68, brother had MI  ROS:  Please see the history of present illness.   All other systems are personally reviewed and negative.   Recent Labs: 10/04/2023: ALT 94; BUN 23; Creatinine, Ser 1.15; Hemoglobin 9.0; Platelets 303; Potassium 3.9; Sodium 140  Personally reviewed   Wt Readings from Last 3 Encounters:  12/10/23 99.4 kg (219 lb 3.2 oz)  10/04/23 97.1 kg (214 lb)  06/30/22 109.3 kg (241 lb)   BP (!) 140/100   Pulse 75   Ht 5\' 3"  (1.6 m)   Wt 99.4 kg (219 lb 3.2 oz)   SpO2 99%   BMI 38.83 kg/m   PHYSICAL EXAM: General:  Well appearing. No respiratory difficulty  HEENT: normal Neck: supple. JVD 12 cm. Carotids 2+ bilat; no bruits. No lymphadenopathy or thyromegaly appreciated. Cor: PMI nondisplaced. Regular rate & rhythm. No rubs, gallops or murmurs. Lungs: clear Abdomen: soft, nontender, distended. No hepatosplenomegaly. No bruits or masses. Good bowel sounds. Extremities: no cyanosis, clubbing, rash,1+ b/l ankle edema Neuro: alert & oriented x 3, cranial nerves grossly intact. moves all 4 extremities w/o difficulty. Affect pleasant.    Assessment & Plan: 1. Chronic systolic CHF: Nonischemic cardiomyopathy. EF was reportedly low on echo at hospital in [facility] in 12/19.  Echo in 3/20 showed EF 20-25% with moderate RV systolic dysfunction.  There had been no recent pregnancy so doubt peri-partum CMP.  No family history of CHF and coronary CTA showed no CAD.  Cardiac MRI showed LV EF 16% with possible small LV thrombus, RV EF 21%.  Delayed enhancement imaging showed non-coronary pattern LGE that could be consistent with myocarditis.  Repeat echo with medical therapy in 6/20 showed improvement in EF to 60-65%.   Unfortunately, she was lost to follow up and ran out of her medications x 6 months. Echo off meds (11/22) showed EF down to 35-40%. EF improved w/ she restarted meds, echo 2/23 showed EF up to 45-50%. Echo 2/24 EF 50-55%, RV normal. Unfortunately, she has failed to keep strict compliance w/ meds. Now p/w NYHA Class III symptoms and volume overload, w/ poor response to increase in Lasix. BP also elevated. She did take all of her prescribed medicines today.  - Increase Bidil to 2 tablets tid  - Continue Entresto 97/103 mg bid.  - Continue spironolactone 25 mg daily.  - Continue Toprol XL 100 mg daily.  - She has had frequent yeast infections, hold off on SGLT2i for now.  - Stop Lasix - Start Torsemide 20 mg daily  - Update echo  - discussed importance to avoid future pregnancies  - long discussion regarding importance to maintain strict compliance    2. HTN: Elevated - Increase Bidil to 2 tabs tid  - continue all other HF GDMT per above  - Had previous sleep study at [facility] in 2020, negative for OSA but may consider repeating. Discuss at next f/u visit    3. Palpitations - pt reports occurs frequently w/ near syncope but no frank syncope - EKG today shows NSR - concern that EF is back down in setting of poor med compliance, will order 2D Echo - place 2 wk zio  - check BMP today   5. Pulmonary embolus: Occurred in 3/20.  May have been triggered by low cardiac output/stasis of flow.  She has no family history of VTE.  - Continue Eliquis 5 mg bid. She reports full compliance w/ this   6. Atrial fibrillation: Paroxysmal. NSR on EKG today  - plan Zio per above - continue metoprolol and Eliquis   7. Possible small LV thrombus: Not seen on more recent echoes.  Will repeat per above - continue on Eliquis   F/u in 3 wks w/ APP to reasess volume status and BP and f/u on results of Echo   Signed, Robbie Lis, PA-C  12/10/2023  [facility] [address] [facility] [place] [address] [phone] (office) [phone] (fax)

## 2023-12-10 NOTE — Patient Instructions (Addendum)
 Stop Lasix. Start Torsemide 20 mg daily - Rx sent. Increase Bidil to two tablets three times daily - updated Rx sent. Labs today - will call you if abnormal.  Zio monitor has been placed for two weeks - see below. Echo has been ordered - see below. Return to Heart Failure Clinic in 2 - 3 weeks. See below. Please call us at [phone] if any questions or concerns prior to your next visit.    Your provider has recommended that  you wear a Zio Patch for 14 days.  This monitor will record your heart rhythm for our review.  IF you have any symptoms while wearing the monitor please press the button.  If you have any issues with the patch or you notice a red or orange light on it please call the company at [phone].  Once you remove the patch please mail it back to the company as soon as possible so we can get the results.   Zio patch placed onto patient.  All instructions and information reviewed with patient, they verbalize understanding with no questions.

## 2023-12-10 NOTE — Progress Notes (Signed)
 Zio patch placed onto patient.  All instructions and information reviewed with patient, they verbalize understanding with no questions.

## 2023-12-11 ENCOUNTER — Telehealth (HOSPITAL_COMMUNITY): Payer: Self-pay

## 2023-12-11 ENCOUNTER — Encounter (HOSPITAL_COMMUNITY): Payer: Self-pay | Admitting: Cardiology

## 2023-12-11 DIAGNOSIS — I5042 Chronic combined systolic (congestive) and diastolic (congestive) heart failure: Secondary | ICD-10-CM

## 2023-12-11 NOTE — Telephone Encounter (Signed)
 Patient's labs has been placed and appointment scheduled.  Pt aware, agreeable, and verbalized understanding

## 2023-12-11 NOTE — Telephone Encounter (Signed)
-----   Message from [place] sent at 12/10/2023  7:51 PM EST ----- BNP elevated as expected. SCr and K stable. Diuretics adjusted. Repeat BMP in 1 wk

## 2023-12-16 NOTE — Progress Notes (Signed)
 [facility] Note  PCP:  Lewis Moccasin, MD  Cardiologist: Dr Shirlee Latch  Chief Complaint: Heart Failure Follow Up, SOB and Palpitations    History of Present Illness: [name] [name] is a 38 y.o. female with a history of pregnancy induced HTN, systolic HF with EF 16% on cMRI (12/2018), acute PE 12/2018 now on Eliquis, possible LV thrombus, and PAF on Eliquis.    Admitted 12/17/18 with 30 lb weight gain. CTA showed RLL PE with no obvious right heart strain. She was started on heparin and transitioned to Eliquis prior to discharge. Echo showed EF 20-25% with moderately reduced RV systolic function. AHF team consulted. She was diuresed with lasix drip and metolazone. Coronary CT showed no CAD. HF medications were optimized. cMRI showed EF 16% with possible LV thrombus, RV EF 21%, and LGE pattern that could be consistent with myocarditis. Co-ox was monitored and remained normal. She had an episode of atrial fibrillation that converted with IV amiodarone. She was sent home on amiodarone but is now off it.    Echo in 6/20 showed EF up to 60-65% with moderate LVH and normal RV size and systolic function.   She became pregnant 5/21 and she was instructed to stop spiro, Entresto and Eliquis; she stopped carvedilol and BiDil as well. She followed up with AHF PharmD 6/21 and BP was 180s/110s, requiring clonidine. Carvedilol, BiDil and Lasix were restarted.  Unfortunately she suffered an ectopic pregnancy w/ hemoperitoneum and underwent left salpingectomy 6/21.  Lost to follow up. Ran out of HF meds in 02/2021.  Seen back in clinic 11/22, BP elevated and GDMT restarted.  Echo in 11/22 (off meds) with EF 35-40%.   Echo 2/23 EF 45-50% with mild LVH, normal RV, mild MR, IVC normal.   Echo 2/24 EF 50-55%, RV normal.  AHF f/u 2/25 she had not been seen since 9/23. Had not been compliant with meds. Reported palpitations, SOB, weight gain and BLE edema. Meds adjusted and close f/u arranged.    Today she returns for AHF follow up. Overall feeling good, better since being more compliant with her med regimen. Denies palpitations, CP, dizziness, edema, or PND/Orthopnea. Denies SOB. Appetite ok. No fever or chills. Weight at home 206 pounds. Taking all medications. Denies ETOH, tobacco or drug use. Drinks 6-7 glasses of water a day. Eats out a lot but tries to watch what she eats.   EKG NSR 68 bpm (Personally reviewed)    She said she is not planning on getting pregnant. Using contraception.   PMH: 1. Pregnancy-induced HTN 2. Pulmonary embolus: RLL, 3/20.  3. Atrial fibrillation: Paroxysmal.  Episode noted during 3/20 hospitalization.  4. Chronic systolic CHF: Nonischemic cardiomyopathy.  - Echo (3/20) with EF 20-25%, moderately decreased RV systolic function.  - Cardiac MRI (3/20): EF 16% with possible LV thrombus, RV EF 21%, and LGE pattern that could be consistent with myocarditis. - Coronary CTA (3/20): No CAD - Echo (6/20): EF 60-65%, moderate LVH, normal RV size and systolic function - Echo (11/22): EF 35-40%, global HK, grade II DD, RV is low/normal - Echo (2/23): EF 45-50% with mild LVH, normal RV, mild MR, IVC normal. 5. Sleep study 6/20 showed no OSA.   Past Surgical History:  Procedure Laterality Date   BIRTH CONTROL IMPLANT Left 03/2016   "Nexplanon in my upper arm"   CESAREAN SECTION N/A 02/25/2016   Procedure: CESAREAN SECTION;  Surgeon: Levie Heritage, DO;  Location: [facility] BIRTHING SUITES;  Service:  Obstetrics;  Laterality: N/A;   LAPAROSCOPIC SALPINGO OOPHERECTOMY Left 04/11/2020   Procedure: LAPAROSCOPIC SALPINGECTOMY/Removal of Hemoperitoneum, lysis of adhesions;  Surgeon: Maxie Better, MD;  Location: [facility] OR;  Service: Gynecology;  Laterality: Left;   Current Outpatient Medications  Medication Sig Dispense Refill   apixaban (ELIQUIS) 5 MG TABS tablet Take 1 tablet (5 mg total) by mouth 2 (two) times daily. NEEDS FOLLOW UP APPOINTMENT FOR MORE REFILLS 60  tablet 4   benzonatate (TESSALON) 100 MG capsule Take 1 capsule (100 mg total) by mouth 3 (three) times daily as needed for cough. 30 capsule 0   dicyclomine (BENTYL) 10 MG capsule Take 1 capsule (10 mg total) by mouth 3 (three) times daily as needed for (abdominal cramping). 30 capsule 0   Insulin Pen Needle (UNIFINE PENTIPS) 32G X 6 MM MISC Use as directed with Saxenda. 100 each 0   isosorbide-hydrALAZINE (BIDIL) 20-37.5 MG tablet Take 2 tablets by mouth 3 (three) times daily. 180 tablet 6   loperamide (IMODIUM) 2 MG capsule Take 1 capsule (2 mg total) by mouth 4 (four) times daily as needed for diarrhea or loose stools. 12 capsule 0   metoprolol succinate (TOPROL-XL) 100 MG 24 hr tablet Take 1 tablet (100 mg total) by mouth daily. 90 tablet 1   ondansetron (ZOFRAN-ODT) 4 MG disintegrating tablet Dissolve 1 tablet (4 mg total) on tongue every 8 (eight) hours as needed for nausea or vomiting. 20 tablet 0   sacubitril-valsartan (ENTRESTO) 97-103 MG Take 1 tablet by mouth 2 (two) times daily. NEEDS FOLLOW UP APPOINTMENT FOR MORE REFILLS 60 tablet 4   spironolactone (ALDACTONE) 25 MG tablet Take 1 tablet (25 mg total) by mouth daily. 90 tablet 3   torsemide (DEMADEX) 20 MG tablet Take 1 tablet (20 mg total) by mouth daily. 30 tablet 6   No current facility-administered medications for this encounter.   Allergies:   Patient has no known allergies.   Social History:  The patient  reports that she has never smoked. She has never used smokeless tobacco. She reports that she does not drink alcohol and does not use drugs.   Family History:  The patient's family history includes Heart disease in her mother; Hypertension in her mother; Stroke in her brother. Mother died of MI at 57, brother had MI  ROS:  Please see the history of present illness.   All other systems are personally reviewed and negative.   Recent Labs: 10/04/2023: ALT 94; Hemoglobin 9.0; Platelets 303 12/10/2023: B Natriuretic Peptide  1,237.7 12/18/2023: BUN 19; Creatinine, Ser 1.19; Potassium 4.0; Sodium 134  Personally reviewed   Wt Readings from Last 3 Encounters:  12/22/23 93.5 kg (206 lb 3.2 oz)  12/10/23 99.4 kg (219 lb 3.2 oz)  10/04/23 97.1 kg (214 lb)   Pulse 68   Ht 5\' 3"  (1.6 m)   Wt 93.5 kg (206 lb 3.2 oz)   SpO2 99%   BMI 36.53 kg/m   PHYSICAL EXAM: General:  well appearing.  No respiratory difficulty. Walked into clinic HEENT: normal Neck: supple. JVD flat.  Cor: PMI nondisplaced. Regular rate & rhythm. No rubs, gallops or murmurs. Lungs: clear, diminished bases Extremities: no cyanosis, clubbing, rash, trace ankle edema  Neuro: alert & oriented x 3. Moves all 4 extremities w/o difficulty. Affect pleasant.   Assessment & Plan: 1. Chronic systolic CHF: Nonischemic cardiomyopathy. EF was reportedly low on echo at hospital in [facility] in 12/19.  Echo in 3/20 showed EF 20-25% with moderate RV  systolic dysfunction.  There had been no recent pregnancy so doubt peri-partum CMP.  No family history of CHF and coronary CTA showed no CAD.  Cardiac MRI showed LV EF 16% with possible small LV thrombus, RV EF 21%.  Delayed enhancement imaging showed non-coronary pattern LGE that could be consistent with myocarditis.  Repeat echo with medical therapy in 6/20 showed improvement in EF to 60-65%.  Unfortunately, she was lost to follow up and ran out of her medications x 6 months. Echo off meds (11/22) showed EF down to 35-40%. EF improved w/ she restarted meds, echo 2/23 showed EF up to 45-50%. Echo 2/24 EF 50-55%, RV normal. Unfortunately, she had failed to keep strict compliance w/ meds and was volume overloaded at last visit.  - NYHA Class II symptoms. Volume much improved and BP better.  - Continue Bidil 2 tablets tid  - Continue Entresto 97/103 mg bid.  - Continue spironolactone 25 mg daily.  - Continue Toprol XL 100 mg daily.  - Last yeast infection was 7 years ago. Start Farxiga 10 mg daily, will give co-pay  card today.  BMET/BNP today, repeat BMET 7-10 days.   - Decrease Torsemide 20>10 mg daily  - Update echo  - discussed importance to avoid future pregnancies  - long discussion regarding importance to maintain strict compliance   2. HTN: Elevated - Continue Bidil 2 tabs tid  - Continue all other HF GDMT per above  - Had previous sleep study at [facility] in 2020, negative for OSA but may consider repeating. Discuss at next f/u visit    3. Palpitations - pt reports occurs frequently w/ near syncope but no frank syncope. Have improved.  - EKG today shows NSR - Concern that EF is back down in setting of poor med compliance, will order 2D Echo - 2 wk zio placed at last visit, will send back on Thursday.   4. Pulmonary embolus: Occurred in 3/20.  May have been triggered by low cardiac output/stasis of flow.  She has no family history of VTE.  - Continue Eliquis 5 mg bid. She reports full compliance w/ this  - CBC today  5. Atrial fibrillation: Paroxysmal. NSR on EKG today  - Zio as above - continue metoprolol and Eliquis   7. Possible small LV thrombus: Not seen on more recent echoes.  Will repeat per above - continue on Eliquis   8. Obesity - Body mass index is 36.53 kg/m.  - On wegovy but unable to afford recently. Will re-refer back to PharmD to discuss other options - Check A1c, update lipid panel  Follow up in 2 months with Dr. Shirlee Latch to discuss echo and Zio findings.   Signed, Alen Bleacher, NP  12/22/2023  [facility] [address] [facility] [place] [address] [phone] (office) [phone] (fax)

## 2023-12-18 ENCOUNTER — Ambulatory Visit (HOSPITAL_COMMUNITY)
Admission: RE | Admit: 2023-12-18 | Discharge: 2023-12-18 | Disposition: A | Discharge status: Home / Self Care | Payer: 59 | Source: Ambulatory Visit | Attending: Internal Medicine | Admitting: Internal Medicine

## 2023-12-18 DIAGNOSIS — I5042 Chronic combined systolic (congestive) and diastolic (congestive) heart failure: Secondary | ICD-10-CM | POA: Insufficient documentation

## 2023-12-18 LAB — BASIC METABOLIC PANEL
Anion gap: 8 (ref 5–15)
BUN: 19 mg/dL (ref 6–20)
CO2: 24 mmol/L (ref 22–32)
Calcium: 8.8 mg/dL — ABNORMAL LOW (ref 8.9–10.3)
Chloride: 102 mmol/L (ref 98–111)
Creatinine, Ser: 1.19 mg/dL — ABNORMAL HIGH (ref 0.44–1.00)
GFR, Estimated: >60 mL/min (ref >60)
Glucose, Bld: 86 mg/dL (ref 70–99)
Potassium: 4 mmol/L (ref 3.5–5.1)
Sodium: 134 mmol/L — ABNORMAL LOW (ref 135–145)

## 2023-12-22 ENCOUNTER — Other Ambulatory Visit (HOSPITAL_COMMUNITY): Payer: Self-pay

## 2023-12-22 ENCOUNTER — Ambulatory Visit (HOSPITAL_COMMUNITY)
Admission: RE | Admit: 2023-12-22 | Discharge: 2023-12-22 | Disposition: A | Discharge status: Home / Self Care | Payer: 59 | Source: Ambulatory Visit | Attending: Internal Medicine | Admitting: Internal Medicine

## 2023-12-22 ENCOUNTER — Telehealth (HOSPITAL_COMMUNITY): Payer: Self-pay

## 2023-12-22 ENCOUNTER — Encounter (HOSPITAL_COMMUNITY): Payer: Self-pay

## 2023-12-22 ENCOUNTER — Other Ambulatory Visit: Payer: Self-pay

## 2023-12-22 VITALS — HR 68 | Ht 63.0 in | Wt 206.2 lb

## 2023-12-22 DIAGNOSIS — I5022 Chronic systolic (congestive) heart failure: Secondary | ICD-10-CM | POA: Diagnosis not present

## 2023-12-22 DIAGNOSIS — Z79899 Other long term (current) drug therapy: Secondary | ICD-10-CM | POA: Diagnosis not present

## 2023-12-22 DIAGNOSIS — Z7901 Long term (current) use of anticoagulants: Secondary | ICD-10-CM | POA: Diagnosis not present

## 2023-12-22 DIAGNOSIS — R002 Palpitations: Secondary | ICD-10-CM

## 2023-12-22 DIAGNOSIS — Z7984 Long term (current) use of oral hypoglycemic drugs: Secondary | ICD-10-CM | POA: Insufficient documentation

## 2023-12-22 DIAGNOSIS — Z86711 Personal history of pulmonary embolism: Secondary | ICD-10-CM | POA: Insufficient documentation

## 2023-12-22 DIAGNOSIS — I480 Paroxysmal atrial fibrillation: Secondary | ICD-10-CM | POA: Diagnosis not present

## 2023-12-22 DIAGNOSIS — Z6836 Body mass index (BMI) 36.0-36.9, adult: Secondary | ICD-10-CM | POA: Diagnosis not present

## 2023-12-22 DIAGNOSIS — I428 Other cardiomyopathies: Secondary | ICD-10-CM | POA: Insufficient documentation

## 2023-12-22 DIAGNOSIS — I4891 Unspecified atrial fibrillation: Secondary | ICD-10-CM | POA: Diagnosis not present

## 2023-12-22 DIAGNOSIS — Z91148 Patient's other noncompliance with medication regimen for other reason: Secondary | ICD-10-CM | POA: Diagnosis not present

## 2023-12-22 DIAGNOSIS — Z6841 Body Mass Index (BMI) 40.0 and over, adult: Secondary | ICD-10-CM

## 2023-12-22 DIAGNOSIS — E669 Obesity, unspecified: Secondary | ICD-10-CM | POA: Insufficient documentation

## 2023-12-22 DIAGNOSIS — I10 Essential (primary) hypertension: Secondary | ICD-10-CM | POA: Diagnosis not present

## 2023-12-22 DIAGNOSIS — I110 Hypertensive heart disease with heart failure: Secondary | ICD-10-CM | POA: Diagnosis not present

## 2023-12-22 LAB — BASIC METABOLIC PANEL
Anion gap: 10 (ref 5–15)
BUN: 21 mg/dL — ABNORMAL HIGH (ref 6–20)
CO2: 27 mmol/L (ref 22–32)
Calcium: 9.4 mg/dL (ref 8.9–10.3)
Chloride: 103 mmol/L (ref 98–111)
Creatinine, Ser: 1.31 mg/dL — ABNORMAL HIGH (ref 0.44–1.00)
GFR, Estimated: 54 mL/min — ABNORMAL LOW (ref >60)
Glucose, Bld: 109 mg/dL — ABNORMAL HIGH (ref 70–99)
Potassium: 4 mmol/L (ref 3.5–5.1)
Sodium: 140 mmol/L (ref 135–145)

## 2023-12-22 LAB — CBC
HCT: 41.6 % (ref 36.0–46.0)
Hemoglobin: 11.7 g/dL — ABNORMAL LOW (ref 12.0–15.0)
MCH: 19.6 pg — ABNORMAL LOW (ref 26.0–34.0)
MCHC: 28.1 g/dL — ABNORMAL LOW (ref 30.0–36.0)
MCV: 69.8 fL — ABNORMAL LOW (ref 80.0–100.0)
Platelets: 292 10*3/uL (ref 150–400)
RBC: 5.96 MIL/uL — ABNORMAL HIGH (ref 3.87–5.11)
RDW: 19 % — ABNORMAL HIGH (ref 11.5–15.5)
WBC: 6.8 10*3/uL (ref 4.0–10.5)
nRBC: 0 % (ref 0.0–0.2)

## 2023-12-22 LAB — LIPID PANEL
Cholesterol: 223 mg/dL — ABNORMAL HIGH (ref 0–200)
HDL: 73 mg/dL (ref >40)
LDL Cholesterol: 136 mg/dL — ABNORMAL HIGH (ref 0–99)
Total CHOL/HDL Ratio: 3.1 ratio
Triglycerides: 68 mg/dL (ref <150)
VLDL: 14 mg/dL (ref 0–40)

## 2023-12-22 LAB — HEMOGLOBIN A1C
Hgb A1c MFr Bld: 5.7 % — ABNORMAL HIGH (ref 4.8–5.6)
Mean Plasma Glucose: 116.89 mg/dL

## 2023-12-22 LAB — BRAIN NATRIURETIC PEPTIDE: B Natriuretic Peptide: 128.9 pg/mL — ABNORMAL HIGH (ref 0.0–100.0)

## 2023-12-22 MED ORDER — TORSEMIDE 20 MG PO TABS
10.0000 mg | ORAL_TABLET | Freq: Every day | ORAL | 3 refills | Status: DC
  Filled 2023-12-22: qty 45, 90d supply, fill #0

## 2023-12-22 MED ORDER — DAPAGLIFLOZIN PROPANEDIOL 10 MG PO TABS
10.0000 mg | ORAL_TABLET | Freq: Every day | ORAL | 5 refills | Status: AC
  Filled 2023-12-22: qty 30, 30d supply, fill #0
  Filled 2024-07-15: qty 30, 30d supply, fill #1
  Filled 2024-10-22: qty 30, 30d supply, fill #2

## 2023-12-22 NOTE — Patient Instructions (Addendum)
 Medication Changes:  START FARXIGA 10MG  ONCE DAILY   DECREASE TORSEMIDE TO 10MG  ONCE DAILY   Lab Work:  Labs done today, your results will be available in MyChart, we will contact you for abnormal readings.  Then return again In ONE WEEK AS SCHEDULED FOR REPEAT LABS  Referrals:  YOU HAVE BEEN REFERRED TO Pharmd AT [facility] THEY WILL REACH OUT TO YOU OR CALL TO ARRANGE THIS. PLEASE CALL us WITH ANY CONCERNS   Follow-Up in: 2 months with Dr. Shirlee Latch PLEASE CALL OUR OFFICE AROUND APRIL TO GET SCHEDULED FOR YOUR APPOINTMENT. PHONE NUMBER IS [phone] OPTION 2   At the [facility], you and your health needs are our priority. We have a designated team specialized in the treatment of Heart Failure. This Care Team includes your primary Heart Failure Specialized Cardiologist (physician), Advanced Practice Providers (APPs- Physician Assistants and Nurse Practitioners), and Pharmacist who all work together to provide you with the care you need, when you need it.   You may see any of the following providers on your designated Care Team at your next follow up:  Dr. Arvilla Meres Dr. Marca Ancona Dr. Dorthula Nettles Dr. Theresia Bough Tonye Becket, NP Robbie Lis, [place] [facility] Dresser, [place] Brynda Peon, NP [place] Lee, NP Karle Plumber, PharmD   Please be sure to bring in all your medications bottles to every appointment.   Need to Contact us:  If you have any questions or concerns before your next appointment please send [place] a message through [place] or call our office at [phone].    TO LEAVE A MESSAGE FOR THE NURSE SELECT OPTION 2, PLEASE LEAVE A MESSAGE INCLUDING: YOUR NAME DATE OF BIRTH CALL BACK NUMBER REASON FOR CALL**this is important as we prioritize the call backs  YOU WILL RECEIVE A CALL BACK THE SAME DAY AS LONG AS YOU CALL BEFORE 4:00 PM

## 2023-12-22 NOTE — Telephone Encounter (Signed)
 Advanced [facility] Failure Patient Advocate Encounter  Test billing shows that Farixga and [place] Pepper are both $66.20 for 30 days on the current coverage. This patient is eligible to use a copay savings card that would bring the cost to $0 per month for [place].  Burnell Blanks, CPhT Rx Patient Advocate Phone: [phone]

## 2023-12-23 ENCOUNTER — Encounter (HOSPITAL_COMMUNITY): Payer: 59

## 2023-12-29 ENCOUNTER — Ambulatory Visit (HOSPITAL_COMMUNITY)
Admission: RE | Admit: 2023-12-29 | Discharge: 2023-12-29 | Disposition: A | Discharge status: Home / Self Care | Source: Ambulatory Visit | Attending: Cardiology | Admitting: Cardiology

## 2023-12-29 DIAGNOSIS — I5022 Chronic systolic (congestive) heart failure: Secondary | ICD-10-CM | POA: Diagnosis not present

## 2023-12-29 LAB — BASIC METABOLIC PANEL
Anion gap: 7 (ref 5–15)
BUN: 21 mg/dL — ABNORMAL HIGH (ref 6–20)
CO2: 25 mmol/L (ref 22–32)
Calcium: 9 mg/dL (ref 8.9–10.3)
Chloride: 106 mmol/L (ref 98–111)
Creatinine, Ser: 1.13 mg/dL — ABNORMAL HIGH (ref 0.44–1.00)
GFR, Estimated: >60 mL/min (ref >60)
Glucose, Bld: 89 mg/dL (ref 70–99)
Potassium: 4.2 mmol/L (ref 3.5–5.1)
Sodium: 138 mmol/L (ref 135–145)

## 2024-01-04 DIAGNOSIS — R002 Palpitations: Secondary | ICD-10-CM | POA: Diagnosis not present

## 2024-01-05 NOTE — Addendum Note (Signed)
 Encounter addended by: Crissie Figures, RN on: 01/05/2024 8:55 AM  Actions taken: Imaging Exam ended

## 2024-01-07 ENCOUNTER — Ambulatory Visit (HOSPITAL_COMMUNITY)
Admission: RE | Admit: 2024-01-07 | Discharge: 2024-01-07 | Disposition: A | Discharge status: Home / Self Care | Payer: 59 | Source: Ambulatory Visit | Attending: Cardiology | Admitting: Cardiology

## 2024-01-07 DIAGNOSIS — I5021 Acute systolic (congestive) heart failure: Secondary | ICD-10-CM | POA: Diagnosis not present

## 2024-01-07 DIAGNOSIS — I5042 Chronic combined systolic (congestive) and diastolic (congestive) heart failure: Secondary | ICD-10-CM

## 2024-01-07 DIAGNOSIS — I110 Hypertensive heart disease with heart failure: Secondary | ICD-10-CM | POA: Diagnosis not present

## 2024-01-07 DIAGNOSIS — I340 Nonrheumatic mitral (valve) insufficiency: Secondary | ICD-10-CM | POA: Diagnosis not present

## 2024-01-07 LAB — ECHOCARDIOGRAM COMPLETE
AR max vel: 1.07 cm2
AV Area VTI: 1.14 cm2
AV Area mean vel: 1.06 cm2
AV Mean grad: 7 mmHg
AV Peak grad: 13.5 mmHg
Ao pk vel: 1.84 m/s
Area-P 1/2: 3.85 cm2
Calc EF: 37.3 %
Est EF: 30
MV VTI: 1.53 cm2
S' Lateral: 4.9 cm
Single Plane A2C EF: 33 %
Single Plane A4C EF: 36.2 %

## 2024-01-11 ENCOUNTER — Telehealth (HOSPITAL_COMMUNITY): Payer: Self-pay

## 2024-01-11 NOTE — Telephone Encounter (Signed)
Spoke with patient regarding the following results. Patient made aware and patient verbalized understanding.   Advised patient to call back to office with any issues, questions, or concerns. Patient verbalized understanding.

## 2024-01-11 NOTE — Telephone Encounter (Signed)
-----   Message from Marca Ancona sent at 01/10/2024 11:23 PM EDT ----- No worrisome arrhythmia

## 2024-01-22 ENCOUNTER — Other Ambulatory Visit (HOSPITAL_COMMUNITY): Payer: Self-pay

## 2024-01-26 ENCOUNTER — Other Ambulatory Visit (HOSPITAL_COMMUNITY): Payer: Self-pay

## 2024-02-16 ENCOUNTER — Telehealth: Payer: Self-pay | Admitting: Pharmacist

## 2024-02-16 ENCOUNTER — Encounter: Payer: Self-pay | Admitting: Pharmacist

## 2024-02-16 ENCOUNTER — Other Ambulatory Visit (HOSPITAL_COMMUNITY): Payer: Self-pay

## 2024-02-16 ENCOUNTER — Ambulatory Visit: Attending: Cardiology | Admitting: Pharmacist

## 2024-02-16 ENCOUNTER — Telehealth: Payer: Self-pay | Admitting: Pharmacy Technician

## 2024-02-16 VITALS — Ht 63.0 in | Wt 219.0 lb

## 2024-02-16 DIAGNOSIS — Z6841 Body Mass Index (BMI) 40.0 and over, adult: Secondary | ICD-10-CM

## 2024-02-16 NOTE — Progress Notes (Signed)
 Patient ID: [name] [name]                 DOB: 22-May-1986                    MRN: [id number]     HPI: [name] Teresi is a 38 y.o. female patient referred to pharmacy clinic by May Sparks Diaz,NP to initiate GLP1-RA therapy. PMH is significant for chronic combine systolic and diastolic HF, and obesity. Most recent BMI 206 lbs 36.54 kg/m .   Baseline weight and BMI: 240 lbs 42.02 kg/m  Current weight and BMI: 219 lbs 38.84  kg/m  Goal weight 170-180 lbs  Was on Wegovy . Down to 214 lbs. The plan had stopped covering it so she went off of it. We discussed diet in detail. In the past patient had tried only lifestyle and able to achieve goal weight but is is hard for her to sustain the healthy weight.   Diet:  Breakfast: none  Lunch: Bojegles, frozen dinner, PB&J sandwich  Dinner: eat out most days, pastas, fried chicken  Snack: chips, popcorn  Eat out a lot  Drink: soda and water  Exercise:  Walk at park with girl friends 1-2 times per week 1.5 hrs  Motivated to walk more days of the week and ready to incorporate some chair exercise 2-3 times per week    Family History:  Relation Problem Comments  Mother (Deceased at age 33) Heart disease   Hypertension     Father Metallurgist)   Brother Stroke     Social History:  Alcohol: none  Smoking: none  Labs: Lab Results  Component Value Date   HGBA1C 5.7 (H) 12/22/2023    Wt Readings from Last 1 Encounters:  12/22/23 206 lb 3.2 oz (93.5 kg)    BP Readings from Last 1 Encounters:  12/10/23 (!) 140/100   Pulse Readings from Last 1 Encounters:  12/22/23 68       Component Value Date/Time   CHOL 223 (H) 12/22/2023 1139   CHOL 193 11/04/2016 0926   TRIG 68 12/22/2023 1139   HDL 73 12/22/2023 1139   HDL 36 (L) 11/04/2016 0926   CHOLHDL 3.1 12/22/2023 1139   VLDL 14 12/22/2023 1139   LDLCALC 136 (H) 12/22/2023 1139   LDLCALC 136 (H) 11/04/2016 0926    Past Medical History:  Diagnosis Date   Combined systolic and diastolic CHF     EF 16 by cMRI in 12/2018   Condyloma acuminata    Dysrhythmia    a-fib   HTN in pregnancy, chronic 09/24/2015   [place] labs: [x ]Cr, [x]  24hr protein (208) P:Cr 92 Plat: 207 AST/ALT: 10/10     Hx of RLL Pulmonary Embolism 12/2018 12/17/2018   Hypertension    LV (left ventricular) mural thrombus 02/03/2019   Nonischemic cardiomyopathy 02/03/2019   Echo 12/2018: EF 20-25, Gr 2 DD // cMRI 12/2018:  EF 16, poss LV clot // Cor CTA 12/2018: no sig CAD   Obesity in pregnancy    Antepartum, first trimester   Paroxysmal atrial fibrillation (HCC) 02/03/2019   AF occurred during hosp for PE/CHF/Pneumonia 12/2018 >> Amiod continued for 1 mo post DC, then DC'd    Current Outpatient Medications on File Prior to Visit  Medication Sig Dispense Refill   apixaban  (ELIQUIS ) 5 MG TABS tablet Take 1 tablet (5 mg total) by mouth 2 (two) times daily. NEEDS FOLLOW UP APPOINTMENT FOR MORE REFILLS 60 tablet 4   benzonatate  (TESSALON ) 100 MG  capsule Take 1 capsule (100 mg total) by mouth 3 (three) times daily as needed for cough. 30 capsule 0   dapagliflozin  propanediol (FARXIGA ) 10 MG TABS tablet Take 1 tablet (10 mg total) by mouth daily before breakfast. 30 tablet 5   dicyclomine  (BENTYL ) 10 MG capsule Take 1 capsule (10 mg total) by mouth 3 (three) times daily as needed for (abdominal cramping). 30 capsule 0   Insulin  Pen Needle (UNIFINE PENTIPS) 32G X 6 MM MISC Use as directed with Saxenda . 100 each 0   isosorbide -hydrALAZINE  (BIDIL ) 20-37.5 MG tablet Take 2 tablets by mouth 3 (three) times daily. 180 tablet 6   loperamide  (IMODIUM ) 2 MG capsule Take 1 capsule (2 mg total) by mouth 4 (four) times daily as needed for diarrhea or loose stools. 12 capsule 0   metoprolol  succinate (TOPROL -XL) 100 MG 24 hr tablet Take 1 tablet (100 mg total) by mouth daily. 90 tablet 1   ondansetron  (ZOFRAN -ODT) 4 MG disintegrating tablet Dissolve 1 tablet (4 mg total) on tongue every 8 (eight) hours as needed for nausea or vomiting. 20  tablet 0   sacubitril -valsartan  (ENTRESTO ) 97-103 MG Take 1 tablet by mouth 2 (two) times daily. NEEDS FOLLOW UP APPOINTMENT FOR MORE REFILLS 60 tablet 4   spironolactone  (ALDACTONE ) 25 MG tablet Take 1 tablet (25 mg total) by mouth daily. 90 tablet 3   torsemide  (DEMADEX ) 20 MG tablet Take 0.5 tablets (10 mg total) by mouth daily. 45 tablet 3   No current facility-administered medications on file prior to visit.    No Known Allergies   Assessment/Plan:  1. Weight loss - Patient has not met goal of at least 5% of body weight loss with comprehensive lifestyle modifications alone in the past 3-6 months. Pharmacotherapy is appropriate to pursue as augmentation. Was on Wegovy  tolerated well bu it was cost prohibitive due to lack of coverage.  Will start coverage assessment for Wegovy  or Zepbound. Given negative hx for MI,stroke, PAD and OSA it is hard to get weight loss GLP1 covered by insurance. Confirmed patient is not pregnant and no personal or family history of medullary thyroid  carcinoma (MTC) or Multiple Endocrine Neoplasia syndrome type 2 (MEN 2). Injection technique reviewed at today's visit.  Patient current diet needs significant improvement. We discussed diet plan in details and suggested changes to the current diet. Educated patient on how to   incorporate healthy options in daily routine. Dicussed importance of exercise in daily routine. Advised patient on GLP1 agents common side effects including nausea, diarrhea, dyspepsia, decreased appetite, and fatigue. Counseled patient on reducing meal size and how to titrate medication to minimize side effects. Counseled patient to call if intolerable side effects or if experiencing dehydration, abdominal pain, or dizziness. Patient will adhere to dietary modifications and will target at least 150 minutes of moderate intensity exercise weekly.   Will inform patient once we will find out from insurance about weight loss GLP1 coverage.   Nickola Baron, Pharm.D [facility]. [facility] [address], [place], [place] [address] Phone: [phone]; Fax: [phone]

## 2024-02-16 NOTE — Telephone Encounter (Signed)
 Pt informed about GLP1 coverage  "Per plan: this drug/product is not covered under the pharmacy benefit. Prior Siegfried Dress is not available"

## 2024-02-16 NOTE — Telephone Encounter (Addendum)
 Pharmacy Patient Advocate Encounter   Received notification from Pt Calls Messages that prior authorization for Wegovy / zepbound is required/requested.   Insurance verification completed.   The patient is insured through [employer] .   Per test claim: This drug/product is not covered under the pharmacy benefit. Prior Authorization is not available.

## 2024-03-04 ENCOUNTER — Encounter (HOSPITAL_COMMUNITY): Admitting: Cardiology

## 2024-07-15 ENCOUNTER — Other Ambulatory Visit (HOSPITAL_COMMUNITY): Payer: Self-pay | Admitting: Cardiology

## 2024-07-15 ENCOUNTER — Other Ambulatory Visit: Payer: Self-pay

## 2024-07-15 ENCOUNTER — Other Ambulatory Visit (HOSPITAL_COMMUNITY): Payer: Self-pay

## 2024-07-15 MED ORDER — SACUBITRIL-VALSARTAN 97-103 MG PO TABS
1.0000 | ORAL_TABLET | Freq: Two times a day (BID) | ORAL | 1 refills | Status: AC
  Filled 2024-07-15: qty 60, 30d supply, fill #0

## 2024-09-26 ENCOUNTER — Emergency Department (HOSPITAL_BASED_OUTPATIENT_CLINIC_OR_DEPARTMENT_OTHER)
Admission: EM | Admit: 2024-09-26 | Discharge: 2024-09-26 | Disposition: A | Discharge status: Home / Self Care | Attending: Emergency Medicine | Admitting: Emergency Medicine

## 2024-09-26 ENCOUNTER — Emergency Department (HOSPITAL_BASED_OUTPATIENT_CLINIC_OR_DEPARTMENT_OTHER)

## 2024-09-26 ENCOUNTER — Other Ambulatory Visit: Payer: Self-pay

## 2024-09-26 DIAGNOSIS — R197 Diarrhea, unspecified: Secondary | ICD-10-CM | POA: Diagnosis not present

## 2024-09-26 DIAGNOSIS — I3139 Other pericardial effusion (noninflammatory): Secondary | ICD-10-CM | POA: Diagnosis not present

## 2024-09-26 DIAGNOSIS — I517 Cardiomegaly: Secondary | ICD-10-CM | POA: Diagnosis not present

## 2024-09-26 DIAGNOSIS — R112 Nausea with vomiting, unspecified: Secondary | ICD-10-CM | POA: Diagnosis not present

## 2024-09-26 DIAGNOSIS — Z79899 Other long term (current) drug therapy: Secondary | ICD-10-CM | POA: Diagnosis not present

## 2024-09-26 DIAGNOSIS — R0602 Shortness of breath: Secondary | ICD-10-CM | POA: Diagnosis not present

## 2024-09-26 DIAGNOSIS — R000 Tachycardia, unspecified: Secondary | ICD-10-CM | POA: Diagnosis not present

## 2024-09-26 DIAGNOSIS — R5381 Other malaise: Secondary | ICD-10-CM | POA: Diagnosis not present

## 2024-09-26 DIAGNOSIS — R918 Other nonspecific abnormal finding of lung field: Secondary | ICD-10-CM | POA: Diagnosis not present

## 2024-09-26 DIAGNOSIS — Z794 Long term (current) use of insulin: Secondary | ICD-10-CM | POA: Diagnosis not present

## 2024-09-26 DIAGNOSIS — Z7901 Long term (current) use of anticoagulants: Secondary | ICD-10-CM | POA: Diagnosis not present

## 2024-09-26 LAB — URINALYSIS, ROUTINE W REFLEX MICROSCOPIC
Bacteria, UA: NONE SEEN
Bilirubin Urine: NEGATIVE
Glucose, UA: NEGATIVE mg/dL
Hgb urine dipstick: NEGATIVE
Leukocytes,Ua: NEGATIVE
Nitrite: NEGATIVE
Protein, ur: >300 mg/dL — AB
Specific Gravity, Urine: 1.035 — ABNORMAL HIGH (ref 1.005–1.030)
pH: 6 (ref 5.0–8.0)

## 2024-09-26 LAB — COMPREHENSIVE METABOLIC PANEL WITH GFR
ALT: 34 U/L (ref 0–44)
AST: 36 U/L (ref 15–41)
Albumin: 3.8 g/dL (ref 3.5–5.0)
Alkaline Phosphatase: 52 U/L (ref 38–126)
Anion gap: 13 (ref 5–15)
BUN: 20 mg/dL (ref 6–20)
CO2: 22 mmol/L (ref 22–32)
Calcium: 9.7 mg/dL (ref 8.9–10.3)
Chloride: 104 mmol/L (ref 98–111)
Creatinine, Ser: 1.15 mg/dL — ABNORMAL HIGH (ref 0.44–1.00)
GFR, Estimated: >60 mL/min (ref >60)
Glucose, Bld: 96 mg/dL (ref 70–99)
Potassium: 4.1 mmol/L (ref 3.5–5.1)
Sodium: 139 mmol/L (ref 135–145)
Total Bilirubin: 2.8 mg/dL — ABNORMAL HIGH (ref 0.0–1.2)
Total Protein: 6.6 g/dL (ref 6.5–8.1)

## 2024-09-26 LAB — CBC
HCT: 34.3 % — ABNORMAL LOW (ref 36.0–46.0)
Hemoglobin: 10.1 g/dL — ABNORMAL LOW (ref 12.0–15.0)
MCH: 20.7 pg — ABNORMAL LOW (ref 26.0–34.0)
MCHC: 29.4 g/dL — ABNORMAL LOW (ref 30.0–36.0)
MCV: 70.1 fL — ABNORMAL LOW (ref 80.0–100.0)
Platelets: 320 K/uL (ref 150–400)
RBC: 4.89 MIL/uL (ref 3.87–5.11)
RDW: 17.2 % — ABNORMAL HIGH (ref 11.5–15.5)
WBC: 5.3 K/uL (ref 4.0–10.5)
nRBC: 0 % (ref 0.0–0.2)

## 2024-09-26 LAB — RESP PANEL BY RT-PCR (RSV, FLU A&B, COVID)  RVPGX2
Influenza A by PCR: NEGATIVE
Influenza B by PCR: NEGATIVE
Resp Syncytial Virus by PCR: NEGATIVE
SARS Coronavirus 2 by RT PCR: NEGATIVE

## 2024-09-26 LAB — LIPASE, BLOOD: Lipase: 17 U/L (ref 11–51)

## 2024-09-26 LAB — TROPONIN T, HIGH SENSITIVITY: Troponin T High Sensitivity: 16 ng/L (ref 0–19)

## 2024-09-26 LAB — PREGNANCY, URINE: Preg Test, Ur: NEGATIVE

## 2024-09-26 LAB — PRO BRAIN NATRIURETIC PEPTIDE: Pro Brain Natriuretic Peptide: 11849 pg/mL — ABNORMAL HIGH (ref <300.0)

## 2024-09-26 MED ORDER — METOPROLOL TARTRATE 5 MG/5ML IV SOLN
5.0000 mg | Freq: Once | INTRAVENOUS | Status: AC
  Administered 2024-09-26: 20:00:00 5 mg via INTRAVENOUS
  Filled 2024-09-26: qty 5

## 2024-09-26 MED ORDER — METOPROLOL TARTRATE 5 MG/5ML IV SOLN
5.0000 mg | Freq: Once | INTRAVENOUS | Status: AC
  Administered 2024-09-26: 19:00:00 5 mg via INTRAVENOUS
  Filled 2024-09-26: qty 5

## 2024-09-26 MED ORDER — ONDANSETRON 4 MG PO TBDP
4.0000 mg | ORAL_TABLET | Freq: Three times a day (TID) | ORAL | 0 refills | Status: AC | PRN
  Filled 2024-09-26: qty 20, 7d supply, fill #0

## 2024-09-26 MED ORDER — METOPROLOL TARTRATE 25 MG PO TABS
25.0000 mg | ORAL_TABLET | Freq: Once | ORAL | Status: AC
  Administered 2024-09-26: 20:00:00 25 mg via ORAL
  Filled 2024-09-26: qty 1

## 2024-09-26 MED ORDER — ONDANSETRON HCL 4 MG/2ML IJ SOLN
4.0000 mg | Freq: Once | INTRAMUSCULAR | Status: AC
  Administered 2024-09-26: 19:00:00 4 mg via INTRAVENOUS
  Filled 2024-09-26 (×2): qty 2

## 2024-09-26 MED ORDER — MAGNESIUM SULFATE 2 GM/50ML IV SOLN
2.0000 g | Freq: Once | INTRAVENOUS | Status: AC
  Administered 2024-09-26: 19:00:00 2 g via INTRAVENOUS
  Filled 2024-09-26: qty 50

## 2024-09-26 MED ORDER — ONDANSETRON 4 MG PO TBDP: 4.0000 mg | ORAL_TABLET | Freq: Three times a day (TID) | ORAL | 0 refills | Status: DC | PRN

## 2024-09-26 NOTE — ED Provider Notes (Signed)
 [place] EMERGENCY DEPARTMENT AT [facility] Provider Note   CSN: [id number] Arrival date & time: 09/26/24  1421     Patient presents with: No chief complaint on file.   [name] [name] is a 38 y.o. female.   HPI  Patient is a 38 year old female with past medical history significant for heart failure with ejection fraction of 30% last echocardiogram was done 9 months ago.  Also history of obesity, paroxysmal A-fib, nonischemic cardiomyopathy  Patient presents emergency room today with nausea vomiting she states that she has had 4 or 5 episodes of vomiting over the past couple days had a few episodes of nonbloody soft stool that is not watery.  No fever or focal abdominal pain.     Prior to Admission medications  Medication Sig Start Date End Date Taking? Authorizing Provider  apixaban  (ELIQUIS ) 5 MG TABS tablet Take 1 tablet (5 mg total) by mouth 2 (two) times daily. NEEDS FOLLOW UP APPOINTMENT FOR MORE REFILLS 11/25/23   Glena Harlene HERO, FNP  benzonatate  (TESSALON ) 100 MG capsule Take 1 capsule (100 mg total) by mouth 3 (three) times daily as needed for cough. 11/05/23   Gladis Elsie BROCKS, PA-C  dapagliflozin  propanediol (FARXIGA ) 10 MG TABS tablet Take 1 tablet (10 mg total) by mouth daily before breakfast. 12/22/23   Hayes Beckey CROME, NP  dicyclomine  (BENTYL ) 10 MG capsule Take 1 capsule (10 mg total) by mouth 3 (three) times daily as needed for (abdominal cramping). 09/28/23     Insulin  Pen Needle (UNIFINE PENTIPS) 32G X 6 MM MISC Use as directed with Saxenda . 03/14/22   Rolan Ezra RAMAN, MD  isosorbide -hydrALAZINE  (BIDIL ) 20-37.5 MG tablet Take 2 tablets by mouth 3 (three) times daily. 12/10/23   Marcine Catalan M, PA-C  loperamide  (IMODIUM ) 2 MG capsule Take 1 capsule (2 mg total) by mouth 4 (four) times daily as needed for diarrhea or loose stools. 10/04/23   Zelaya, Oscar A, PA-C  metoprolol  succinate (TOPROL -XL) 100 MG 24 hr tablet Take 1 tablet (100 mg total) by mouth  daily. 10/08/23     ondansetron  (ZOFRAN -ODT) 4 MG disintegrating tablet Take 1 tablet (4 mg total) by mouth every 8 (eight) hours as needed for nausea or vomiting. 09/26/24   Neldon Hamp RAMAN, PA  sacubitril -valsartan  (ENTRESTO ) 97-103 MG Take 1 tablet by mouth 2 (two) times daily. PLEASE SCHEDULE APPOINTMENT FOR MORE REFILLS 2ND ATTEMPT 07/15/24   Rolan Ezra RAMAN, MD  spironolactone  (ALDACTONE ) 25 MG tablet Take 1 tablet (25 mg total) by mouth daily. 10/09/23   Rolan Ezra RAMAN, MD  torsemide  (DEMADEX ) 20 MG tablet Take 0.5 tablets (10 mg total) by mouth daily. 12/22/23   Hayes Beckey CROME, NP    Allergies: Patient has no known allergies.    Review of Systems  Updated Vital Signs BP (!) 154/117   Pulse 79   Temp 97.9 F (36.6 C)   Resp (!) 25   Ht 5' 3 (1.6 m)   Wt 101.6 kg   LMP 09/20/2024 (Approximate)   SpO2 97%   BMI 39.68 kg/m   Physical Exam Vitals and nursing note reviewed.  Constitutional:      General: She is not in acute distress.    Appearance: She is obese.  HENT:     Head: Normocephalic and atraumatic.     Nose: Nose normal.  Eyes:     General: No scleral icterus. Cardiovascular:     Rate and Rhythm: Normal rate and regular rhythm.  Pulses: Normal pulses.     Heart sounds: Normal heart sounds.  Pulmonary:     Effort: Pulmonary effort is normal. No respiratory distress.     Breath sounds: No wheezing.  Abdominal:     Palpations: Abdomen is soft.     Tenderness: There is no abdominal tenderness. There is no guarding or rebound.  Musculoskeletal:     Cervical back: Normal range of motion.     Right lower leg: No edema.     Left lower leg: No edema.  Skin:    General: Skin is warm and dry.     Capillary Refill: Capillary refill takes less than 2 seconds.  Neurological:     Mental Status: She is alert. Mental status is at baseline.  Psychiatric:        Mood and Affect: Mood normal.        Behavior: Behavior normal.     (all labs ordered are listed,  but only abnormal results are displayed) Labs Reviewed  COMPREHENSIVE METABOLIC PANEL WITH GFR - Abnormal; Notable for the following components:      Result Value   Creatinine, Ser 1.15 (*)    Total Bilirubin 2.8 (*)    All other components within normal limits  CBC - Abnormal; Notable for the following components:   Hemoglobin 10.1 (*)    HCT 34.3 (*)    MCV 70.1 (*)    MCH 20.7 (*)    MCHC 29.4 (*)    RDW 17.2 (*)    All other components within normal limits  URINALYSIS, ROUTINE W REFLEX MICROSCOPIC - Abnormal; Notable for the following components:   APPearance HAZY (*)    Specific Gravity, Urine 1.035 (*)    Ketones, ur TRACE (*)    Protein, ur >300 (*)    All other components within normal limits  PRO BRAIN NATRIURETIC PEPTIDE - Abnormal; Notable for the following components:   Pro Brain Natriuretic Peptide 11,849.0 (*)    All other components within normal limits  RESP PANEL BY RT-PCR (RSV, FLU A&B, COVID)  RVPGX2  LIPASE, BLOOD  PREGNANCY, URINE  TROPONIN T, HIGH SENSITIVITY    EKG: EKG Interpretation Date/Time:  Monday September 26 2024 17:33:00 EST Ventricular Rate:  100 PR Interval:  191 QRS Duration:  99 QT Interval:  361 QTC Calculation: 466 R Axis:   -20  Text Interpretation: Sinus tachycardia Left atrial enlargement Left ventricular hypertrophy Nonspecific T abnormalities, lateral leads Confirmed by Yolande Charleston [phone]) on 09/26/2024 6:04:56 PM  Radiology: CT Chest Wo Contrast Result Date: 09/26/2024 EXAM: CT CHEST WITHOUT CONTRAST 09/26/2024 08:12:03 PM TECHNIQUE: CT of the chest was performed without the administration of intravenous contrast. Multiplanar reformatted images are provided for review. Automated exposure control, iterative reconstruction, and/or weight based adjustment of the mA/kV was utilized to reduce the radiation dose to as low as reasonably achievable. COMPARISON: Plain film from earlier in the same day and CT from 12/23/2018. CLINICAL  HISTORY: Nausea and vomiting for several days. FINDINGS: MEDIASTINUM: Heart is enlarged in size. Minimal pericardial effusion is noted. The central airways are clear. Vascular structures are somewhat limited due to the lack of IV contrast. No aneurysmal dilatation is seen. The esophagus is within normal limits. LYMPH NODES: Scattered small mediastinal nodes are seen, although not significant by size criteria. No hilar or axillary lymphadenopathy. LUNGS AND PLEURA: The lungs are well aerated with areas of scarring in the bases bilaterally. No parenchymal changes are noted. No focal consolidation or pulmonary edema.  No pleural effusion or pneumothorax. SOFT TISSUES/BONES: No acute abnormality of the bones or soft tissues. UPPER ABDOMEN: Limited images of the upper abdomen demonstrates no acute abnormality. IMPRESSION: 1. No acute findings. 2. Cardiomegaly with a small pericardial effusion. Electronically signed by: Oneil Devonshire MD 09/26/2024 08:27 PM EST RP Workstation: [id number]   DG Chest Portable 1 View Result Date: 09/26/2024 EXAM: 1 VIEW(S) XRAY OF THE CHEST 09/26/2024 05:43:00 PM COMPARISON: Comparison with 12/19/2018. CLINICAL HISTORY: SOB SOB FINDINGS: LUNGS AND PLEURA: Shallow inspiration. Patchy infiltration or atelectasis in the lung bases. No vascular congestion or edema. No pleural effusion. No pneumothorax. HEART AND MEDIASTINUM: Cardiac enlargement. Mediastinal contours appear intact. BONES AND SOFT TISSUES: No acute osseous abnormality. IMPRESSION: 1. Patchy bibasilar airspace disease or atelectasis without pleural effusion or pneumothorax. 2. Cardiomegaly without overt pulmonary edema. Electronically signed by: Elsie Gravely MD 09/26/2024 06:28 PM EST RP Workstation: [id number]     Procedures   Medications Ordered in the ED  ondansetron  (ZOFRAN ) injection 4 mg (4 mg Intravenous Given 09/26/24 1901)  magnesium  sulfate IVPB 2 g 50 mL (0 g Intravenous Stopped 09/26/24 1924)  metoprolol   tartrate (LOPRESSOR ) injection 5 mg (5 mg Intravenous Given 09/26/24 1902)  metoprolol  tartrate (LOPRESSOR ) tablet 25 mg (25 mg Oral Given 09/26/24 1937)  metoprolol  tartrate (LOPRESSOR ) injection 5 mg (5 mg Intravenous Given 09/26/24 1940)    Clinical Course as of 09/26/24 2135  Mon Sep 26, 2024  1830 I was updated that patient had required Zofran .  I discussed with patient again what her symptoms were and she confirmed nausea vomiting diarrhea she states that she had not felt nauseous that the exact moment when it was offered to her.  She will now take this medication.  Will add on magnesium  and a small dose of metoprolol  that she is hypertensive. [WF]  2132 CT Chest Wo Contrast [WF]    Clinical Course User Index [WF] Neldon Hamp RAMAN, PA                                 Medical Decision Making Amount and/or Complexity of Data Reviewed Labs: ordered. Radiology: ordered.  Risk Prescription drug management.    This patient presents to the ED for concern of abd pain, this involves a number of treatment options, and is a complaint that carries with it a moderate risk of complications and morbidity. A differential diagnosis was considered for the patient's symptoms which is discussed below:   The causes of generalized abdominal pain include but are not limited to AAA, mesenteric ischemia, appendicitis, diverticulitis, DKA, gastritis, gastroenteritis, AMI, nephrolithiasis, pancreatitis, peritonitis, adrenal insufficiency,lead poisoning, iron toxicity, intestinal ischemia, constipation, UTI,SBO/LBO, splenic rupture, biliary disease, IBD, IBS, PUD, or hepatitis.    Co morbidities: Discussed in HPI   Brief History:  Patient is a 38 year old female with past medical history significant for heart failure with ejection fraction of 30% last echocardiogram was done 9 months ago.  Also history of obesity, paroxysmal A-fib, nonischemic cardiomyopathy  Patient presents emergency room today  with nausea vomiting she states that she has had 4 or 5 episodes of vomiting over the past couple days had a few episodes of nonbloody soft stool that is not watery.  No fever or focal abdominal pain.    EMR reviewed including pt PMHx, past surgical history and past visits to ER.   See HPI for more details   Lab Tests:   I ordered and  independently interpreted labs. Labs notable for CMP unremarkable apart from elevation in bilirubin patient has had mildly elevated bilirubin before no abdominal tenderness or guarding no right upper quadrant tenderness specifically.  CBC with anemia no leukocytosis and urinalysis without evidence of infection urine pregnancy negative.  Troponin normal and proBNP elevated although this may be patient's baseline.  She appears clinically dry.  Imaging Studies:  NAD. I personally reviewed all imaging studies and no acute abnormality found. I agree with radiology interpretation.  She does have a small pericardial effusion unlikely to be causing her symptoms.  Cardiac Monitoring:  The patient was maintained on a cardiac monitor.  I personally viewed and interpreted the cardiac monitored which showed an underlying rhythm of: NSR EKG non-ischemic   Medicines ordered:  I ordered medication including metoprolol , magnesium , metoprolol , Zofran  for nausea Reevaluation of the patient after these medicines showed that the patient improved I have reviewed the patients home medicines and have made adjustments as needed   Critical Interventions:     Consults/Attending Physician   I discussed this case with my attending physician who cosigned this note including patient's presenting symptoms, physical exam, and planned diagnostics and interventions. Attending physician stated agreement with plan or made changes to plan which were implemented.   Reevaluation:  After the interventions noted above I re-evaluated patient and found that they have  :improved   Social Determinants of Health:      Problem List / ED Course:  Patient with significant heart failure is a 38 year old with nausea vomiting has benign soft abdomen and reassuring abdominal lab workup.  No pain currently and Zofran  has cured patient of her nausea.  Has not taken her blood pressure medications/antihypertensive/goal-directed medical therapy for her heart failure.  I provided her with a small dose of metoprolol  here.  She is tolerating p.o. will discharge home take her home medications.  Close outpatient follow-up recommended.   Dispostion:  After consideration of the diagnostic results and the patients response to treatment, I feel that the patent would benefit from close outpatient follow-up.       Final diagnoses:  Nausea vomiting and diarrhea    ED Discharge Orders          Ordered    ondansetron  (ZOFRAN -ODT) 4 MG disintegrating tablet  Every 8 hours PRN,   Status:  Discontinued        09/26/24 2037    ondansetron  (ZOFRAN -ODT) 4 MG disintegrating tablet  Every 8 hours PRN        09/26/24 2115               Neldon Hamp RAMAN, [place] 09/26/24 2135    Yolande Lamar BROCKS, MD 09/27/24 1534

## 2024-09-26 NOTE — Discharge Instructions (Addendum)
 When you get home take your blood pressure medications and all of your normal meds.  Use Zofran  every 6 hours for the 3 doses after that only as needed for nausea.  I recommend bland foods such as bananas rice applesauce toast follow-up with your primary care doctor for repeat labs.

## 2024-09-26 NOTE — ED Notes (Signed)
 Reviewed AVS/discharge instruction with patient. Time allotted for and all questions answered. Patient is agreeable for d/c and escorted to ed exit by staff.

## 2024-09-26 NOTE — ED Triage Notes (Signed)
 Pt caox4 ambulatory c/o malaise with N/V/D since Fri. Denies fever.

## 2024-09-27 ENCOUNTER — Other Ambulatory Visit (HOSPITAL_COMMUNITY): Payer: Self-pay

## 2024-10-22 ENCOUNTER — Other Ambulatory Visit: Payer: Self-pay

## 2024-10-23 ENCOUNTER — Other Ambulatory Visit: Payer: Self-pay

## 2024-10-25 ENCOUNTER — Other Ambulatory Visit (HOSPITAL_COMMUNITY): Payer: Self-pay

## 2024-10-31 ENCOUNTER — Encounter (HOSPITAL_COMMUNITY): Payer: Self-pay

## 2024-10-31 ENCOUNTER — Other Ambulatory Visit (HOSPITAL_COMMUNITY): Payer: Self-pay

## 2024-10-31 ENCOUNTER — Ambulatory Visit (HOSPITAL_COMMUNITY)
Admission: RE | Admit: 2024-10-31 | Discharge: 2024-10-31 | Disposition: A | Source: Ambulatory Visit | Attending: Cardiology

## 2024-10-31 ENCOUNTER — Ambulatory Visit (HOSPITAL_COMMUNITY): Payer: Self-pay | Admitting: Cardiology

## 2024-10-31 VITALS — BP 160/100 | HR 87 | Wt 225.8 lb

## 2024-10-31 DIAGNOSIS — I110 Hypertensive heart disease with heart failure: Secondary | ICD-10-CM | POA: Insufficient documentation

## 2024-10-31 DIAGNOSIS — I428 Other cardiomyopathies: Secondary | ICD-10-CM | POA: Diagnosis not present

## 2024-10-31 DIAGNOSIS — I5042 Chronic combined systolic (congestive) and diastolic (congestive) heart failure: Secondary | ICD-10-CM

## 2024-10-31 DIAGNOSIS — Z7984 Long term (current) use of oral hypoglycemic drugs: Secondary | ICD-10-CM | POA: Insufficient documentation

## 2024-10-31 DIAGNOSIS — K920 Hematemesis: Secondary | ICD-10-CM | POA: Diagnosis present

## 2024-10-31 DIAGNOSIS — Z79899 Other long term (current) drug therapy: Secondary | ICD-10-CM | POA: Insufficient documentation

## 2024-10-31 DIAGNOSIS — Z7901 Long term (current) use of anticoagulants: Secondary | ICD-10-CM | POA: Insufficient documentation

## 2024-10-31 DIAGNOSIS — Z6841 Body Mass Index (BMI) 40.0 and over, adult: Secondary | ICD-10-CM | POA: Diagnosis not present

## 2024-10-31 DIAGNOSIS — I480 Paroxysmal atrial fibrillation: Secondary | ICD-10-CM | POA: Diagnosis not present

## 2024-10-31 DIAGNOSIS — E669 Obesity, unspecified: Secondary | ICD-10-CM | POA: Diagnosis not present

## 2024-10-31 DIAGNOSIS — Z86711 Personal history of pulmonary embolism: Secondary | ICD-10-CM | POA: Diagnosis not present

## 2024-10-31 DIAGNOSIS — I5022 Chronic systolic (congestive) heart failure: Secondary | ICD-10-CM | POA: Insufficient documentation

## 2024-10-31 DIAGNOSIS — R0602 Shortness of breath: Secondary | ICD-10-CM | POA: Diagnosis present

## 2024-10-31 LAB — CBC
HCT: 37.3 % (ref 36.0–46.0)
Hemoglobin: 10.3 g/dL — ABNORMAL LOW (ref 12.0–15.0)
MCH: 19.4 pg — ABNORMAL LOW (ref 26.0–34.0)
MCHC: 27.6 g/dL — ABNORMAL LOW (ref 30.0–36.0)
MCV: 70.2 fL — ABNORMAL LOW (ref 80.0–100.0)
Platelets: 315 K/uL (ref 150–400)
RBC: 5.31 MIL/uL — ABNORMAL HIGH (ref 3.87–5.11)
RDW: 18.2 % — ABNORMAL HIGH (ref 11.5–15.5)
WBC: 6 K/uL (ref 4.0–10.5)
nRBC: 0 % (ref 0.0–0.2)

## 2024-10-31 LAB — BASIC METABOLIC PANEL WITH GFR
Anion gap: 9 (ref 5–15)
BUN: 12 mg/dL (ref 6–20)
CO2: 23 mmol/L (ref 22–32)
Calcium: 9.2 mg/dL (ref 8.9–10.3)
Chloride: 105 mmol/L (ref 98–111)
Creatinine, Ser: 1.03 mg/dL — ABNORMAL HIGH (ref 0.44–1.00)
GFR, Estimated: >60 mL/min
Glucose, Bld: 84 mg/dL (ref 70–99)
Potassium: 4 mmol/L (ref 3.5–5.1)
Sodium: 138 mmol/L (ref 135–145)

## 2024-10-31 LAB — PRO BRAIN NATRIURETIC PEPTIDE: Pro Brain Natriuretic Peptide: 2712 pg/mL — ABNORMAL HIGH

## 2024-10-31 MED ORDER — TORSEMIDE 20 MG PO TABS
20.0000 mg | ORAL_TABLET | Freq: Every day | ORAL | 3 refills | Status: AC
  Filled 2024-10-31: qty 45, 45d supply, fill #0

## 2024-10-31 NOTE — Progress Notes (Signed)
"   ReDS Vest / Clip - 10/31/24 1600       ReDS Vest / Clip   Station Marker B    Ruler Value 40    ReDS Value Range Moderate volume overload    ReDS Actual Value 36          "

## 2024-10-31 NOTE — Patient Instructions (Addendum)
 Good to see you today!  START torsemide  20 mg ( 1 tablet) daily  Labs done today, your results will be available in MyChart, we will contact you for abnormal readings.  Your physician recommends that you schedule a follow-up appointment as scheduled  If you have any questions or concerns before your next appointment please send us  a message through [facility] or call our office at [phone].    TO LEAVE A MESSAGE FOR THE NURSE SELECT OPTION 2, PLEASE LEAVE A MESSAGE INCLUDING: YOUR NAME DATE OF BIRTH CALL BACK NUMBER REASON FOR CALL**this is important as we prioritize the call backs  YOU WILL RECEIVE A CALL BACK THE SAME DAY AS LONG AS YOU CALL BEFORE 4:00 PM At the [facility], you and your health needs are our priority. As part of our continuing mission to provide you with exceptional heart care, we have created designated Provider Care Teams. These Care Teams include your primary Cardiologist (physician) and Advanced Practice Providers (APPs- Physician Assistants and Nurse Practitioners) who all work together to provide you with the care you need, when you need it.   You may see any of the following providers on your designated Care Team at your next follow up: Dr Toribio Fuel Dr Ezra Shuck Dr. Morene Brownie Greig Mosses, NP Caffie Shed, [place] [facility] [place], [place] Beckey Coe, NP Jordan Lee, NP Ellouise Class, NP Tinnie Redman, PharmD Jaun Bash, PharmD   Please be sure to bring in all your medications bottles to every appointment.    Thank you for choosing [facility]

## 2024-10-31 NOTE — Progress Notes (Signed)
 "    [facility] Note  PCP:  Waylan Almarie SAUNDERS, MD  Cardiologist: Dr Rolan  Chief Complaint: Heart Failure Follow Up, SOB and Palpitations    History of Present Illness: [name] [name] is a 39 y.o. female with a history of pregnancy induced HTN, systolic HF with EF 16% on cMRI (12/2018), acute PE 12/2018 now on Eliquis , possible LV thrombus, and PAF on Eliquis .    Admitted 12/17/18 with 30 lb weight gain. CTA showed RLL PE with no obvious right heart strain. She was started on heparin  and transitioned to Eliquis  prior to discharge. Echo showed EF 20-25% with moderately reduced RV systolic function. AHF team consulted. She was diuresed with lasix  drip and metolazone . Coronary CT showed no CAD. HF medications were optimized. cMRI showed EF 16% with possible LV thrombus, RV EF 21%, and LGE pattern that could be consistent with myocarditis. Co-ox was monitored and remained normal. She had an episode of atrial fibrillation that converted with IV amiodarone . She was sent home on amiodarone  but is now off it.    Echo in 6/20 showed EF up to 60-65% with moderate LVH and normal RV size and systolic function.   She became pregnant 5/21 and she was instructed to stop spiro, Entresto  and Eliquis ; she stopped carvedilol  and BiDil  as well. She followed up with AHF PharmD 6/21 and BP was 180s/110s, requiring clonidine . Carvedilol , BiDil  and Lasix  were restarted.  Unfortunately she suffered an ectopic pregnancy w/ hemoperitoneum and underwent left salpingectomy 6/21.  Lost to follow up. Ran out of HF meds in 02/2021.  Seen back in clinic 11/22, BP elevated and GDMT restarted.  Echo in 11/22 (off meds) with EF 35-40%.   Echo 2/23 EF 45-50% with mild LVH, normal RV, mild MR, IVC normal.   Echo 2/24 EF 50-55%, RV normal.  AHF f/u 2/25 she had not been seen since 9/23. Had not been compliant with meds. Reported palpitations, SOB, weight gain and BLE edema. Meds restarted and echo arranged. Echo  3/25 EF back down to 30%, RV mildly reduced. Since that time, GDMT titrated back up.   She presents today for f/u. She has felt poorly recently. Went to the ED last month w/ complaint of nausea and vomiting. Pro BNP was elevated at 11,800 but provider notes mention that she appeared clinically dry though unclear if she was given fluids. She was given IV zofran  and sent home from ED.   Last wk, she felt worse w/ worsening n/v and increased dyspnea/orthopnea. Also reports hematemesis x 1, bright red blood. Unable to take her HF meds around this time due to n/v. She was ultimately able to restart meds and reports good UOP and interval improvement in symptoms. Wt trending down and breathing improving but still feels like she is retaining fluid. ReDs mildly elevated at 36%. BP mild-moderately elevated but just took her mid day dose of Bidil . Reports full compliance w/ meds. No further gross bleeding.    EKG: (Personally reviewed), not performed today    PMH: 1. Pregnancy-induced HTN 2. Pulmonary embolus: RLL, 3/20.  3. Atrial fibrillation: Paroxysmal.  Episode noted during 3/20 hospitalization.  4. Chronic systolic CHF: Nonischemic cardiomyopathy.  - Echo (3/20) with EF 20-25%, moderately decreased RV systolic function.  - Cardiac MRI (3/20): EF 16% with possible LV thrombus, RV EF 21%, and LGE pattern that could be consistent with myocarditis. - Coronary CTA (3/20): No CAD - Echo (6/20): EF 60-65%, moderate LVH, normal RV size and systolic  function - Echo (11/22): EF 35-40%, global HK, grade II DD, RV is low/normal - Echo (2/23): EF 45-50% with mild LVH, normal RV, mild MR, IVC normal. 5. Sleep study 6/20 showed no OSA.   Past Surgical History:  Procedure Laterality Date   BIRTH CONTROL IMPLANT Left 03/2016   Nexplanon  in my upper arm   CESAREAN SECTION N/A 02/25/2016   Procedure: CESAREAN SECTION;  Surgeon: Lang JINNY Peel, DO;  Location: [facility] BIRTHING SUITES;  Service: Obstetrics;   Laterality: N/A;   LAPAROSCOPIC SALPINGO OOPHERECTOMY Left 04/11/2020   Procedure: LAPAROSCOPIC SALPINGECTOMY/Removal of Hemoperitoneum, lysis of adhesions;  Surgeon: Rutherford Gain, MD;  Location: [facility] OR;  Service: Gynecology;  Laterality: Left;   Current Outpatient Medications  Medication Sig Dispense Refill   apixaban  (ELIQUIS ) 5 MG TABS tablet Take 1 tablet (5 mg total) by mouth 2 (two) times daily. NEEDS FOLLOW UP APPOINTMENT FOR MORE REFILLS 60 tablet 4   benzonatate  (TESSALON ) 100 MG capsule Take 1 capsule (100 mg total) by mouth 3 (three) times daily as needed for cough. 30 capsule 0   dapagliflozin  propanediol (FARXIGA ) 10 MG TABS tablet Take 1 tablet (10 mg total) by mouth daily before breakfast. 30 tablet 5   isosorbide -hydrALAZINE  (BIDIL ) 20-37.5 MG tablet Take 2 tablets by mouth 3 (three) times daily. 180 tablet 6   loperamide  (IMODIUM ) 2 MG capsule Take 1 capsule (2 mg total) by mouth 4 (four) times daily as needed for diarrhea or loose stools. 12 capsule 0   metoprolol  succinate (TOPROL -XL) 100 MG 24 hr tablet Take 1 tablet (100 mg total) by mouth daily. 90 tablet 1   sacubitril -valsartan  (ENTRESTO ) 97-103 MG Take 1 tablet by mouth 2 (two) times daily. PLEASE SCHEDULE APPOINTMENT FOR MORE REFILLS 2ND ATTEMPT 60 tablet 1   spironolactone  (ALDACTONE ) 25 MG tablet Take 1 tablet (25 mg total) by mouth daily. 90 tablet 3   dicyclomine  (BENTYL ) 10 MG capsule Take 1 capsule (10 mg total) by mouth 3 (three) times daily as needed for (abdominal cramping). 30 capsule 0   Insulin  Pen Needle (UNIFINE PENTIPS) 32G X 6 MM MISC Use as directed with Saxenda . 100 each 0   ondansetron  (ZOFRAN -ODT) 4 MG disintegrating tablet Take 1 tablet (4 mg total) by mouth every 8 (eight) hours as needed for nausea or vomiting. 20 tablet 0   torsemide  (DEMADEX ) 20 MG tablet Take 1 tablet (20 mg total) by mouth daily. 45 tablet 3   No current facility-administered medications for this encounter.   Allergies:    Patient has no known allergies.   Social History:  The patient  reports that she has never smoked. She has never used smokeless tobacco. She reports that she does not drink alcohol and does not use drugs.   Family History:  The patient's family history includes Heart disease in her mother; Hypertension in her mother; Stroke in her brother. Mother died of MI at 110, brother had MI  ROS:  Please see the history of present illness.   All other systems are personally reviewed and negative.   Recent Labs: 12/22/2023: B Natriuretic Peptide 128.9 09/26/2024: ALT 34; BUN 20; Creatinine, Ser 1.15; Potassium 4.1; Pro Brain Natriuretic Peptide 11,849.0; Sodium 139 10/31/2024: Hemoglobin 10.3; Platelets 315  Personally reviewed   Wt Readings from Last 3 Encounters:  10/31/24 102.4 kg (225 lb 12.8 oz)  09/26/24 101.6 kg (224 lb)  02/16/24 99.3 kg (219 lb)   BP (!) 160/100   Pulse 87   Wt 102.4 kg (225  lb 12.8 oz)   SpO2 99%   BMI 40.00 kg/m   Physical Exam  GENERAL: obese, well appearing, NAD Lungs- diminished at bases  CARDIAC:  JVP 10 cm          Normal rate with regular rhythm. No MRG. No LEE  ABDOMEN: Soft, non-tender, non-distended.  EXTREMITIES: Warm and well perfused.  NEUROLOGIC: No obvious FND   Assessment & Plan: 1. Chronic systolic CHF: Nonischemic cardiomyopathy. EF was reportedly low on echo at hospital in [facility] in 12/19.  Echo in 3/20 showed EF 20-25% with moderate RV systolic dysfunction.  There had been no recent pregnancy so doubt peri-partum CMP.  No family history of CHF and coronary CTA showed no CAD.  Cardiac MRI showed LV EF 16% with possible small LV thrombus, RV EF 21%.  Delayed enhancement imaging showed non-coronary pattern LGE that could be consistent with myocarditis.  Repeat echo with medical therapy in 6/20 showed improvement in EF to 60-65%.  Unfortunately, she was lost to follow up and ran out of her medications x 6 months. Echo off meds (11/22) showed EF  down to 35-40%. EF improved w/ she restarted meds, echo 2/23 showed EF up to 45-50%. Echo 2/24 EF 50-55%, RV normal. Echo 3/25 EF back down to 30%, RV mildly reduced in setting of poor med compliance  - Recent worsening NYHA Class II-III symptoms + n/v, likely 2/2 volume overload/ abdominal edema. Recent Pro-BNP elevated at 11,800. Now feeling better w/ restart of HF meds but still w/ volume overload on exam and by ReDs, 36% - restart torsemide  20 mg daily  - continue Farxiga  10 mg daily  - continue Entresto  97-103 mg bid - continue spiro 25 mg daily. If SCr/K permits, may trial increase to 50 mg daily  - continue Bidil  2 tablet tid  - continue Toprol  XL 100 mg daily    2. HTN: Elevated but just took mid day dose of Bidil   - Continue HF GDMT per above - if SCr/BP permits, may increase sprio to 50 mg daily  - if still elevated at f/u visit, consider switching Toprol  XL to Coreg   - Had previous sleep study at [facility] in 2020, negative for OSA but may consider repeating. Discuss at next f/u visit    3. Hematemesis  - 1 time occurrence w/ n/v episode. Denies any further gross bleeding - ? 2/2 fluid overload/ gastric congestion  - check CBC   - needs to remain on Eliquis  given h/o low EF and PE  - denies chronic use of NSAIDs    4. Pulmonary embolus: Occurred in 3/20.  May have been triggered by low cardiac output/stasis of flow.  She has no family history of VTE.  - Continue Eliquis  5 mg bid. She reports full compliance w/ this  - CBC today  5. Atrial fibrillation: Paroxysmal. RRR on exam  - continue metoprolol  and Eliquis    7. Possible small LV thrombus: Not seen on more recent echoes.  - continue on Eliquis    8. Obesity - Body mass index is 40 kg/m.  - previously on wegovy  but unable to afford recently. ? If insurance will cover now. Can discuss further at f/u visit.   Follow up w/ APP in 1-2 wks to reassess volume status and symptoms   Signed, Caffie Shed, PA-C   10/31/2024  [facility] [address] [facility] [place] [address] [phone] (office) [phone] (fax) "

## 2024-11-04 ENCOUNTER — Other Ambulatory Visit (HOSPITAL_COMMUNITY): Payer: Self-pay

## 2024-11-11 ENCOUNTER — Telehealth (HOSPITAL_COMMUNITY): Payer: Self-pay

## 2024-11-11 NOTE — Telephone Encounter (Signed)
 Called to confirm/remind patient of their appointment at the [facility] on 11/14/24.   Appointment:   [] Confirmed  [] Left mess   [x] No answer/No voice mail  [] VM Full/unable to leave message  [] Phone not in service

## 2024-11-11 NOTE — Progress Notes (Signed)
 "    Advanced [facility] Clinic Note  PCP:  Waylan Almarie SAUNDERS, MD  HF Cardiologist: Dr Rolan   HPI: [name] [name] is a 39 y.o. female with a history of pregnancy induced HTN, systolic HF with EF 16% on cMRI (12/2018), acute PE 12/2018 now on Eliquis , possible LV thrombus, and PAF on Eliquis .    Admitted 12/17/18 with 30 lb weight gain. CTA showed RLL PE with no obvious right heart strain. She was started on heparin  and transitioned to Eliquis  prior to discharge. Echo showed EF 20-25% with moderately reduced RV systolic function. AHF team consulted. She was diuresed with lasix  drip and metolazone . Coronary CT showed no CAD. HF medications were optimized. cMRI showed EF 16% with possible LV thrombus, RV EF 21%, and LGE pattern that could be consistent with myocarditis. Co-ox was monitored and remained normal. She had an episode of atrial fibrillation that converted with IV amiodarone . She was sent home on amiodarone  but is now off it.    Echo in 6/20 showed EF up to 60-65% with moderate LVH and normal RV size and systolic function.   She became pregnant 5/21 and she was instructed to stop spiro, Entresto  and Eliquis ; she stopped carvedilol  and BiDil  as well. She followed up with AHF PharmD 6/21 and BP was 180s/110s, requiring clonidine . Carvedilol , BiDil  and Lasix  were restarted.  Unfortunately she suffered an ectopic pregnancy w/ hemoperitoneum and underwent left salpingectomy 6/21.  Lost to follow up. Ran out of HF meds in 02/2021.  Seen back in clinic 11/22, BP elevated and GDMT restarted.  Echo in 11/22 (off meds) with EF 35-40%.   Echo 2/23 EF 45-50% with mild LVH, normal RV, mild MR, IVC normal.   Echo 2/24 EF 50-55%, RV normal.  Lost to follow up since 06/2022.   Echo 3/25 EF back down to 30%, RV mildly reduced.   Today she returns for HF follow up. Overall feeling fine. No SOB with activity. Feels woozy now, but she attributes this to the BiDil . BP at home ~ 140s/90s. Denies  palpitations, abnormal bleeding, CP, edema, or PND/Orthopnea. Appetite ok. Weight at home 212 pounds. Taking all medications.   ReDs reading: 28 %, normal  Labs (3/25): LDL 136 Labs (1/26): K 4.0, creatinine 1.03  PMH: 1. Pregnancy-induced HTN 2. Pulmonary embolus: RLL, 3/20.  3. Atrial fibrillation: Paroxysmal.  Episode noted during 3/20 hospitalization.  4. Chronic systolic CHF: Nonischemic cardiomyopathy.  - Echo (3/20) with EF 20-25%, moderately decreased RV systolic function.  - Cardiac MRI (3/20): EF 16% with possible LV thrombus, RV EF 21%, and LGE pattern that could be consistent with myocarditis. - Coronary CTA (3/20): No CAD - Echo (6/20): EF 60-65%, moderate LVH, normal RV size and systolic function - Echo (11/22): EF 35-40%, global HK, grade II DD, RV is low/normal - Echo (2/23): EF 45-50% with mild LVH, normal RV, mild MR, IVC normal. - Echo (2/24): EF 50-55%, RV normal - Echo (3/25): EF 30%, RV mildly reduced 5. Sleep study 6/20 showed no OSA.   Past Surgical History:  Procedure Laterality Date   BIRTH CONTROL IMPLANT Left 03/2016   Nexplanon  in my upper arm   CESAREAN SECTION N/A 02/25/2016   Procedure: CESAREAN SECTION;  Surgeon: Lang JINNY Peel, DO;  Location: [facility] BIRTHING SUITES;  Service: Obstetrics;  Laterality: N/A;   LAPAROSCOPIC SALPINGO OOPHERECTOMY Left 04/11/2020   Procedure: LAPAROSCOPIC SALPINGECTOMY/Removal of Hemoperitoneum, lysis of adhesions;  Surgeon: Rutherford Gain, MD;  Location: [facility] OR;  Service: Gynecology;  Laterality: Left;   Current Outpatient Medications  Medication Sig Dispense Refill   apixaban  (ELIQUIS ) 5 MG TABS tablet Take 1 tablet (5 mg total) by mouth 2 (two) times daily. NEEDS FOLLOW UP APPOINTMENT FOR MORE REFILLS 60 tablet 4   benzonatate  (TESSALON ) 100 MG capsule Take 1 capsule (100 mg total) by mouth 3 (three) times daily as needed for cough. 30 capsule 0   dapagliflozin  propanediol (FARXIGA ) 10 MG TABS tablet Take 1 tablet  (10 mg total) by mouth daily before breakfast. 30 tablet 5   dicyclomine  (BENTYL ) 10 MG capsule Take 1 capsule (10 mg total) by mouth 3 (three) times daily as needed for (abdominal cramping). 30 capsule 0   metoprolol  succinate (TOPROL -XL) 100 MG 24 hr tablet Take 1 tablet (100 mg total) by mouth daily. 90 tablet 1   ondansetron  (ZOFRAN -ODT) 4 MG disintegrating tablet Take 1 tablet (4 mg total) by mouth every 8 (eight) hours as needed for nausea or vomiting. 20 tablet 0   sacubitril -valsartan  (ENTRESTO ) 97-103 MG Take 1 tablet by mouth 2 (two) times daily. PLEASE SCHEDULE APPOINTMENT FOR MORE REFILLS 2ND ATTEMPT 60 tablet 1   spironolactone  (ALDACTONE ) 25 MG tablet Take 1 tablet (25 mg total) by mouth daily. 90 tablet 3   torsemide  (DEMADEX ) 20 MG tablet Take 1 tablet (20 mg total) by mouth daily. 45 tablet 3   No current facility-administered medications for this encounter.   Allergies:   Patient has no known allergies.   Social History:  The patient  reports that she has never smoked. She has never used smokeless tobacco. She reports that she does not drink alcohol and does not use drugs.   Family History:  The patient's family history includes Heart disease in her mother; Hypertension in her mother; Stroke in her brother. Mother died of MI at 71, brother had MI  ROS:  Please see the history of present illness.   All other systems are personally reviewed and negative.   Wt Readings from Last 3 Encounters:  11/14/24 98.3 kg (216 lb 12.8 oz)  10/31/24 102.4 kg (225 lb 12.8 oz)  09/26/24 101.6 kg (224 lb)   BP (!) 92/50 (BP Location: Right Arm, Patient Position: Sitting)   Pulse 62   Ht 5' 3.5 (1.613 m)   Wt 98.3 kg (216 lb 12.8 oz)   SpO2 99%   BMI 37.80 kg/m   Physical Exam  General:  NAD. No resp difficulty, walked into clinic HEENT: Normal Neck: Supple. No JVD. Cor: Regular rate & rhythm. No rubs, gallops or murmurs. Lungs: Clear Abdomen: Soft, obese, nontender, nondistended.   Extremities: No cyanosis, clubbing, rash, edema Neuro: Alert & oriented x 3, moves all 4 extremities w/o difficulty. Affect pleasant.  Assessment & Plan: 1. Chronic systolic CHF: Nonischemic cardiomyopathy. EF was reportedly low on echo at hospital in [facility] in 12/19.  Echo in 3/20 showed EF 20-25% with moderate RV systolic dysfunction.  There had been no recent pregnancy so doubt peri-partum CMP.  No family history of CHF and coronary CTA showed no CAD.  Cardiac MRI showed LV EF 16% with possible small LV thrombus, RV EF 21%.  Delayed enhancement imaging showed non-coronary pattern LGE that could be consistent with myocarditis.  Repeat echo with medical therapy in 6/20 showed improvement in EF to 60-65%.  Unfortunately, she was lost to follow up and ran out of her medications x 6 months. Echo off meds (11/22) showed EF down to 35-40%. EF improved w/ she restarted  meds, echo 2/23 showed EF up to 45-50%. Echo 2/24 EF 50-55%, RV normal. Echo 3/25 EF back down to 30%, RV mildly reduced in setting of poor med compliance. NYHA II today, she is not volume overloaded, weight stable and ReDs 28%. - with low BP, stop BiDil  - Continue torsemide  20 mg daily. BMET today. - Continue Farxiga  10 mg daily.  - Continue Entresto  97-103 mg bid. - Continue spiro 25 mg daily.   - Continue Toprol  XL 100 mg daily.  - She is married, but not sexually active. Still has monthly cycles. We discussed teratogenicity of GDMT and I asked her to notify clinic if she has a + pregnancy test. - Repeat echo at next visit to look for EF improvement. 2. HTN: BP low, she is symptomatic today. - Stop BiDil  as above - I asked her to check BP at home, notify clinic if BP > 140/90 or systolic remains < 95 3. Pulmonary embolus: Occurred in 3/20.  May have been triggered by low cardiac output/stasis of flow.  She has no family history of VTE.  - Continue Eliquis  5 mg bid. She reports full compliance w/ this  4. Atrial fibrillation:  Paroxysmal. Regular on exam with this. - Continue metoprolol  and Eliquis   5. Possible small LV thrombus: Not seen on more recent echoes.  - Continue on Eliquis . 6. Obesity: Body mass index is 37.8 kg/m. - previously on Wegovy  but her insurance no longer covers.  Follow up in 3 months with Dr. Rolan + echo  Signed, [name] CHRISTELLA Gainer, FNP  11/14/2024  [facility] [address] [facility] [place] [address] [phone] (office) [phone] (fax) "

## 2024-11-14 ENCOUNTER — Encounter (HOSPITAL_COMMUNITY): Payer: Self-pay

## 2024-11-14 ENCOUNTER — Ambulatory Visit (HOSPITAL_COMMUNITY): Payer: Self-pay | Admitting: Family Medicine

## 2024-11-14 ENCOUNTER — Ambulatory Visit (HOSPITAL_COMMUNITY)
Admission: RE | Admit: 2024-11-14 | Discharge: 2024-11-14 | Disposition: A | Discharge status: Home / Self Care | Source: Ambulatory Visit | Attending: Family Medicine | Admitting: Family Medicine

## 2024-11-14 ENCOUNTER — Telehealth (HOSPITAL_COMMUNITY): Payer: Self-pay

## 2024-11-14 VITALS — BP 92/50 | HR 62 | Ht 63.5 in | Wt 216.8 lb

## 2024-11-14 DIAGNOSIS — Z86711 Personal history of pulmonary embolism: Secondary | ICD-10-CM | POA: Diagnosis not present

## 2024-11-14 DIAGNOSIS — Z91148 Patient's other noncompliance with medication regimen for other reason: Secondary | ICD-10-CM | POA: Insufficient documentation

## 2024-11-14 DIAGNOSIS — E669 Obesity, unspecified: Secondary | ICD-10-CM | POA: Diagnosis not present

## 2024-11-14 DIAGNOSIS — Z6837 Body mass index (BMI) 37.0-37.9, adult: Secondary | ICD-10-CM | POA: Insufficient documentation

## 2024-11-14 DIAGNOSIS — Z79899 Other long term (current) drug therapy: Secondary | ICD-10-CM | POA: Insufficient documentation

## 2024-11-14 DIAGNOSIS — I5022 Chronic systolic (congestive) heart failure: Secondary | ICD-10-CM | POA: Insufficient documentation

## 2024-11-14 DIAGNOSIS — I10 Essential (primary) hypertension: Secondary | ICD-10-CM | POA: Diagnosis not present

## 2024-11-14 DIAGNOSIS — Z7984 Long term (current) use of oral hypoglycemic drugs: Secondary | ICD-10-CM | POA: Insufficient documentation

## 2024-11-14 DIAGNOSIS — I4891 Unspecified atrial fibrillation: Secondary | ICD-10-CM

## 2024-11-14 DIAGNOSIS — I480 Paroxysmal atrial fibrillation: Secondary | ICD-10-CM | POA: Insufficient documentation

## 2024-11-14 DIAGNOSIS — I428 Other cardiomyopathies: Secondary | ICD-10-CM | POA: Insufficient documentation

## 2024-11-14 DIAGNOSIS — Z7901 Long term (current) use of anticoagulants: Secondary | ICD-10-CM | POA: Insufficient documentation

## 2024-11-14 DIAGNOSIS — I110 Hypertensive heart disease with heart failure: Secondary | ICD-10-CM | POA: Insufficient documentation

## 2024-11-14 LAB — BASIC METABOLIC PANEL WITH GFR
Anion gap: 12 (ref 5–15)
BUN: 20 mg/dL (ref 6–20)
CO2: 24 mmol/L (ref 22–32)
Calcium: 9.4 mg/dL (ref 8.9–10.3)
Chloride: 104 mmol/L (ref 98–111)
Creatinine, Ser: 1.29 mg/dL — ABNORMAL HIGH (ref 0.44–1.00)
GFR, Estimated: 55 mL/min — ABNORMAL LOW
Glucose, Bld: 100 mg/dL — ABNORMAL HIGH (ref 70–99)
Potassium: 3.6 mmol/L (ref 3.5–5.1)
Sodium: 140 mmol/L (ref 135–145)

## 2024-11-14 NOTE — Patient Instructions (Addendum)
 Good to see you today!  STOP Bidil   Labs done today, your results will be available in MyChart, we will contact you for abnormal readings.   Call office if b/p greater than 140/90 or  b/p less than 95 systolic  Your physician has requested that you have an echocardiogram. Echocardiography is a painless test that uses sound waves to create images of your heart. It provides your doctor with information about the size and shape of your heart and how well your hearts chambers and valves are working. This procedure takes approximately one hour. There are no restrictions for this procedure. Please do NOT wear cologne, perfume, aftershave, or lotions (deodorant is allowed). Please arrive 15 minutes prior to your appointment time.  Please note: We ask at that you not bring children with you during ultrasound (echo/ vascular) testing. Due to room size and safety concerns, children are not allowed in the ultrasound rooms during exams. Our front office staff cannot provide observation of children in our lobby area while testing is being conducted. An adult accompanying a patient to their appointment will only be allowed in the ultrasound room at the discretion of the ultrasound technician under special circumstances. We apologize for any inconvenience.  Your physician recommends that you schedule a follow-up appointment 3 months with echocardiogram ( as scheduled)  If you have any questions or concerns before your next appointment please send us  a message through [place] or call our office at [phone].    TO LEAVE A MESSAGE FOR THE NURSE SELECT OPTION 2, PLEASE LEAVE A MESSAGE INCLUDING: YOUR NAME DATE OF BIRTH CALL BACK NUMBER REASON FOR CALL**this is important as we prioritize the call backs  YOU WILL RECEIVE A CALL BACK THE SAME DAY AS LONG AS YOU CALL BEFORE 4:00 PM At the [facility], you and your health needs are our priority. As part of our continuing mission to provide  you with exceptional heart care, we have created designated Provider Care Teams. These Care Teams include your primary Cardiologist (physician) and Advanced Practice Providers (APPs- Physician Assistants and Nurse Practitioners) who all work together to provide you with the care you need, when you need it.   You may see any of the following providers on your designated Care Team at your next follow up: Dr Toribio Fuel Dr Ezra Shuck Dr. Morene Brownie Greig Mosses, NP Caffie Shed, [place] [facility] [place], [place] Beckey Coe, NP Jordan Lee, NP Ellouise Class, NP Tinnie Redman, PharmD Jaun Bash, PharmD   Please be sure to bring in all your medications bottles to every appointment.    Thank you for choosing [facility]

## 2024-11-14 NOTE — Progress Notes (Signed)
"   ReDS Vest / Clip - 11/14/24 1442       ReDS Vest / Clip   Station Marker B    Ruler Value 40    ReDS Value Range Low volume    ReDS Actual Value 28          "

## 2024-11-14 NOTE — Telephone Encounter (Signed)
 Left message to see if she can come @ 1:30 today.

## 2025-02-20 ENCOUNTER — Ambulatory Visit (HOSPITAL_COMMUNITY): Admitting: Cardiology

## 2025-02-20 ENCOUNTER — Other Ambulatory Visit (HOSPITAL_COMMUNITY)
